# Patient Record
Sex: Male | Born: 1940 | Race: White | Hispanic: No | Smoking: Never smoker
Health system: Southern US, Community
[De-identification: ages and names within clinical notes are randomized; demographics above are authoritative.]

## PROBLEM LIST (undated history)

## (undated) DIAGNOSIS — G629 Polyneuropathy, unspecified: Secondary | ICD-10-CM

## (undated) DIAGNOSIS — F419 Anxiety disorder, unspecified: Secondary | ICD-10-CM

## (undated) DIAGNOSIS — M1990 Unspecified osteoarthritis, unspecified site: Secondary | ICD-10-CM

## (undated) DIAGNOSIS — M869 Osteomyelitis, unspecified: Secondary | ICD-10-CM

## (undated) DIAGNOSIS — I350 Nonrheumatic aortic (valve) stenosis: Secondary | ICD-10-CM

## (undated) DIAGNOSIS — F1011 Alcohol abuse, in remission: Secondary | ICD-10-CM

## (undated) DIAGNOSIS — K2210 Ulcer of esophagus without bleeding: Secondary | ICD-10-CM

## (undated) DIAGNOSIS — F329 Major depressive disorder, single episode, unspecified: Secondary | ICD-10-CM

## (undated) DIAGNOSIS — E785 Hyperlipidemia, unspecified: Secondary | ICD-10-CM

## (undated) DIAGNOSIS — D649 Anemia, unspecified: Secondary | ICD-10-CM

## (undated) DIAGNOSIS — Z9289 Personal history of other medical treatment: Secondary | ICD-10-CM

## (undated) DIAGNOSIS — I739 Peripheral vascular disease, unspecified: Secondary | ICD-10-CM

## (undated) DIAGNOSIS — I10 Essential (primary) hypertension: Secondary | ICD-10-CM

## (undated) DIAGNOSIS — M4302 Spondylolysis, cervical region: Secondary | ICD-10-CM

## (undated) DIAGNOSIS — R011 Cardiac murmur, unspecified: Secondary | ICD-10-CM

## (undated) DIAGNOSIS — G5620 Lesion of ulnar nerve, unspecified upper limb: Secondary | ICD-10-CM

## (undated) DIAGNOSIS — Z8489 Family history of other specified conditions: Secondary | ICD-10-CM

## (undated) DIAGNOSIS — F32A Depression, unspecified: Secondary | ICD-10-CM

## (undated) DIAGNOSIS — L97529 Non-pressure chronic ulcer of other part of left foot with unspecified severity: Secondary | ICD-10-CM

## (undated) DIAGNOSIS — E11621 Type 2 diabetes mellitus with foot ulcer: Secondary | ICD-10-CM

## (undated) HISTORY — DX: Alcohol abuse, in remission: F10.11

## (undated) HISTORY — DX: Essential (primary) hypertension: I10

## (undated) HISTORY — DX: Lesion of ulnar nerve, unspecified upper limb: G56.20

## (undated) HISTORY — DX: Peripheral vascular disease, unspecified: I73.9

## (undated) HISTORY — DX: Osteomyelitis, unspecified: M86.9

## (undated) HISTORY — PX: LAPAROSCOPIC CHOLECYSTECTOMY: SUR755

## (undated) HISTORY — DX: Polyneuropathy, unspecified: G62.9

## (undated) HISTORY — DX: Hyperlipidemia, unspecified: E78.5

## (undated) HISTORY — DX: Spondylolysis, cervical region: M43.02

## (undated) HISTORY — PX: TONSILLECTOMY: SUR1361

---

## 1999-04-16 ENCOUNTER — Ambulatory Visit (HOSPITAL_COMMUNITY): Admission: RE | Admit: 1999-04-16 | Discharge: 1999-04-16 | Payer: Self-pay | Admitting: Gastroenterology

## 1999-07-21 ENCOUNTER — Encounter: Admission: RE | Admit: 1999-07-21 | Discharge: 1999-07-21 | Payer: Self-pay | Admitting: Otolaryngology

## 1999-07-21 ENCOUNTER — Encounter: Payer: Self-pay | Admitting: Otolaryngology

## 2001-06-20 ENCOUNTER — Encounter: Payer: Self-pay | Admitting: Emergency Medicine

## 2001-06-20 ENCOUNTER — Encounter (INDEPENDENT_AMBULATORY_CARE_PROVIDER_SITE_OTHER): Payer: Self-pay | Admitting: Specialist

## 2001-06-20 ENCOUNTER — Inpatient Hospital Stay (HOSPITAL_COMMUNITY): Admission: EM | Admit: 2001-06-20 | Discharge: 2001-06-22 | Payer: Self-pay | Admitting: Emergency Medicine

## 2004-02-08 ENCOUNTER — Encounter (INDEPENDENT_AMBULATORY_CARE_PROVIDER_SITE_OTHER): Payer: Self-pay | Admitting: *Deleted

## 2004-02-08 ENCOUNTER — Ambulatory Visit (HOSPITAL_COMMUNITY): Admission: RE | Admit: 2004-02-08 | Discharge: 2004-02-08 | Payer: Self-pay | Admitting: Gastroenterology

## 2005-07-30 ENCOUNTER — Encounter: Payer: Self-pay | Admitting: Vascular Surgery

## 2005-07-30 ENCOUNTER — Ambulatory Visit: Admission: RE | Admit: 2005-07-30 | Discharge: 2005-07-30 | Payer: Self-pay | Admitting: Family Medicine

## 2005-09-18 ENCOUNTER — Ambulatory Visit: Payer: Self-pay | Admitting: Family Medicine

## 2005-12-04 ENCOUNTER — Encounter: Admission: RE | Admit: 2005-12-04 | Discharge: 2005-12-04 | Payer: Self-pay | Admitting: Family Medicine

## 2006-01-07 ENCOUNTER — Ambulatory Visit: Payer: Self-pay | Admitting: Family Medicine

## 2006-01-07 LAB — CONVERTED CEMR LAB
ALT: 40 units/L (ref 0–40)
AST: 31 units/L (ref 0–37)
BUN: 14 mg/dL (ref 6–23)
CO2: 25 meq/L (ref 19–32)
Calcium: 9.8 mg/dL (ref 8.4–10.5)
Chloride: 106 meq/L (ref 96–112)
Chol/HDL Ratio, serum: 4.9
Cholesterol: 192 mg/dL (ref 0–200)
Creatinine, Ser: 1.1 mg/dL (ref 0.4–1.5)
GFR calc non Af Amer: 72 mL/min
Glomerular Filtration Rate, Af Am: 87 mL/min/{1.73_m2}
Glucose, Bld: 87 mg/dL (ref 70–99)
HDL: 39.4 mg/dL (ref >39.0)
LDL DIRECT: 103.8 mg/dL
Potassium: 4.3 meq/L (ref 3.5–5.1)
Sodium: 142 meq/L (ref 135–145)
Triglyceride fasting, serum: 202 mg/dL (ref 0–149)
VLDL: 40 mg/dL (ref 0–40)

## 2006-04-22 ENCOUNTER — Ambulatory Visit: Payer: Self-pay | Admitting: Family Medicine

## 2006-04-23 ENCOUNTER — Encounter: Admission: RE | Admit: 2006-04-23 | Discharge: 2006-04-23 | Payer: Self-pay | Admitting: Family Medicine

## 2006-06-11 DIAGNOSIS — F419 Anxiety disorder, unspecified: Secondary | ICD-10-CM

## 2006-06-11 DIAGNOSIS — F329 Major depressive disorder, single episode, unspecified: Secondary | ICD-10-CM

## 2006-06-11 DIAGNOSIS — I10 Essential (primary) hypertension: Secondary | ICD-10-CM

## 2006-09-08 ENCOUNTER — Ambulatory Visit: Payer: Self-pay | Admitting: Family Medicine

## 2006-10-01 ENCOUNTER — Ambulatory Visit: Payer: Self-pay | Admitting: Family Medicine

## 2006-10-04 ENCOUNTER — Telehealth (INDEPENDENT_AMBULATORY_CARE_PROVIDER_SITE_OTHER): Payer: Self-pay | Admitting: *Deleted

## 2006-10-05 ENCOUNTER — Encounter (INDEPENDENT_AMBULATORY_CARE_PROVIDER_SITE_OTHER): Payer: Self-pay | Admitting: *Deleted

## 2007-01-14 ENCOUNTER — Ambulatory Visit: Payer: Self-pay | Admitting: Family Medicine

## 2007-04-29 ENCOUNTER — Ambulatory Visit: Payer: Self-pay | Admitting: Family Medicine

## 2007-05-12 ENCOUNTER — Telehealth (INDEPENDENT_AMBULATORY_CARE_PROVIDER_SITE_OTHER): Payer: Self-pay | Admitting: Family Medicine

## 2007-05-24 ENCOUNTER — Encounter (INDEPENDENT_AMBULATORY_CARE_PROVIDER_SITE_OTHER): Payer: Self-pay | Admitting: *Deleted

## 2007-07-28 ENCOUNTER — Telehealth (INDEPENDENT_AMBULATORY_CARE_PROVIDER_SITE_OTHER): Payer: Self-pay | Admitting: *Deleted

## 2007-07-29 ENCOUNTER — Ambulatory Visit: Payer: Self-pay | Admitting: Internal Medicine

## 2008-01-26 ENCOUNTER — Ambulatory Visit: Payer: Self-pay | Admitting: Internal Medicine

## 2008-03-05 ENCOUNTER — Telehealth (INDEPENDENT_AMBULATORY_CARE_PROVIDER_SITE_OTHER): Payer: Self-pay | Admitting: *Deleted

## 2008-03-07 ENCOUNTER — Ambulatory Visit: Payer: Self-pay | Admitting: Internal Medicine

## 2008-03-12 ENCOUNTER — Telehealth (INDEPENDENT_AMBULATORY_CARE_PROVIDER_SITE_OTHER): Payer: Self-pay | Admitting: *Deleted

## 2008-03-12 LAB — CONVERTED CEMR LAB
BUN: 16 mg/dL (ref 6–23)
Basophils Relative: 0.2 % (ref 0.0–3.0)
CO2: 28 meq/L (ref 19–32)
Calcium: 9.4 mg/dL (ref 8.4–10.5)
Cholesterol: 187 mg/dL (ref 0–200)
Creatinine, Ser: 1 mg/dL (ref 0.4–1.5)
GFR calc Af Amer: 96 mL/min
Glucose, Bld: 100 mg/dL — ABNORMAL HIGH (ref 70–99)
HCT: 41 % (ref 39.0–52.0)
Hemoglobin: 14.5 g/dL (ref 13.0–17.0)
Lymphocytes Relative: 36.8 % (ref 12.0–46.0)
Monocytes Absolute: 0.7 10*3/uL (ref 0.1–1.0)
Monocytes Relative: 13.4 % — ABNORMAL HIGH (ref 3.0–12.0)
Neutro Abs: 2.5 10*3/uL (ref 1.4–7.7)
RBC: 4.24 M/uL (ref 4.22–5.81)
RDW: 11.9 % (ref 11.5–14.6)
Total CHOL/HDL Ratio: 3.7
VLDL: 52 mg/dL — ABNORMAL HIGH (ref 0–40)

## 2008-05-04 ENCOUNTER — Telehealth (INDEPENDENT_AMBULATORY_CARE_PROVIDER_SITE_OTHER): Payer: Self-pay | Admitting: *Deleted

## 2008-06-01 ENCOUNTER — Ambulatory Visit: Payer: Self-pay | Admitting: Internal Medicine

## 2008-06-13 ENCOUNTER — Encounter: Payer: Self-pay | Admitting: Internal Medicine

## 2008-09-04 ENCOUNTER — Telehealth: Payer: Self-pay | Admitting: Internal Medicine

## 2008-09-14 ENCOUNTER — Ambulatory Visit: Payer: Self-pay | Admitting: Internal Medicine

## 2008-09-14 ENCOUNTER — Ambulatory Visit: Payer: Self-pay | Admitting: Family Medicine

## 2008-09-19 ENCOUNTER — Ambulatory Visit: Payer: Self-pay | Admitting: Internal Medicine

## 2008-09-19 LAB — CONVERTED CEMR LAB
AST: 24 units/L (ref 0–37)
BUN: 17 mg/dL (ref 6–23)
Basophils Absolute: 0 10*3/uL (ref 0.0–0.1)
CO2: 28 meq/L (ref 19–32)
Chloride: 103 meq/L (ref 96–112)
Cholesterol: 158 mg/dL (ref 0–200)
Creatinine, Ser: 0.9 mg/dL (ref 0.4–1.5)
Direct LDL: 68.1 mg/dL
Eosinophils Relative: 1.7 % (ref 0.0–5.0)
Glucose, Bld: 97 mg/dL (ref 70–99)
Hemoglobin: 14.6 g/dL (ref 13.0–17.0)
Lymphocytes Relative: 35.6 % (ref 12.0–46.0)
Monocytes Relative: 10.5 % (ref 3.0–12.0)
Neutro Abs: 2.4 10*3/uL (ref 1.4–7.7)
Potassium: 3.4 meq/L — ABNORMAL LOW (ref 3.5–5.1)
RBC: 4.45 M/uL (ref 4.22–5.81)
RDW: 11.6 % (ref 11.5–14.6)
TSH: 1.52 microintl units/mL (ref 0.35–5.50)
Total CHOL/HDL Ratio: 4
VLDL: 59 mg/dL — ABNORMAL HIGH (ref 0.0–40.0)
WBC: 4.7 10*3/uL (ref 4.5–10.5)

## 2008-12-07 ENCOUNTER — Encounter: Payer: Self-pay | Admitting: Internal Medicine

## 2009-02-26 ENCOUNTER — Encounter (INDEPENDENT_AMBULATORY_CARE_PROVIDER_SITE_OTHER): Payer: Self-pay | Admitting: *Deleted

## 2009-03-21 ENCOUNTER — Encounter: Payer: Self-pay | Admitting: Internal Medicine

## 2009-03-27 ENCOUNTER — Ambulatory Visit: Payer: Self-pay | Admitting: Internal Medicine

## 2009-03-27 DIAGNOSIS — M25519 Pain in unspecified shoulder: Secondary | ICD-10-CM

## 2009-06-27 ENCOUNTER — Ambulatory Visit: Payer: Self-pay | Admitting: Internal Medicine

## 2009-07-04 LAB — CONVERTED CEMR LAB
ALT: 40 units/L (ref 0–53)
Alkaline Phosphatase: 39 units/L (ref 39–117)
Bilirubin, Direct: 0 mg/dL (ref 0.0–0.3)
Cholesterol: 158 mg/dL (ref 0–200)
HDL: 47.9 mg/dL (ref >39.00)
Total Protein: 6.9 g/dL (ref 6.0–8.3)

## 2009-08-06 ENCOUNTER — Ambulatory Visit: Payer: Self-pay | Admitting: Internal Medicine

## 2009-08-09 ENCOUNTER — Emergency Department (HOSPITAL_BASED_OUTPATIENT_CLINIC_OR_DEPARTMENT_OTHER)
Admission: EM | Admit: 2009-08-09 | Discharge: 2009-08-10 | Payer: Self-pay | Source: Home / Self Care | Admitting: Emergency Medicine

## 2009-08-12 ENCOUNTER — Telehealth (INDEPENDENT_AMBULATORY_CARE_PROVIDER_SITE_OTHER): Payer: Self-pay | Admitting: *Deleted

## 2009-08-12 LAB — CONVERTED CEMR LAB
BUN: 18 mg/dL (ref 6–23)
Calcium: 9.6 mg/dL (ref 8.4–10.5)
GFR calc non Af Amer: 86.98 mL/min (ref >60)
Glucose, Bld: 97 mg/dL (ref 70–99)

## 2009-08-19 ENCOUNTER — Ambulatory Visit: Payer: Self-pay | Admitting: Internal Medicine

## 2009-09-25 ENCOUNTER — Telehealth: Payer: Self-pay | Admitting: Internal Medicine

## 2010-02-04 ENCOUNTER — Ambulatory Visit: Payer: Self-pay | Admitting: Internal Medicine

## 2010-04-06 LAB — CONVERTED CEMR LAB
ALT: 22 units/L (ref 0–53)
AST: 19 units/L (ref 0–37)
BUN: 14 mg/dL (ref 6–23)
CO2: 29 meq/L (ref 19–32)
Calcium: 9.6 mg/dL (ref 8.4–10.5)
Chloride: 104 meq/L (ref 96–112)
Cholesterol: 160 mg/dL (ref 0–200)
Creatinine, Ser: 1 mg/dL (ref 0.4–1.5)
Direct LDL: 81.5 mg/dL
GFR calc Af Amer: 96 mL/min
GFR calc non Af Amer: 80 mL/min
Glucose, Bld: 97 mg/dL (ref 70–99)
HDL: 32.8 mg/dL — ABNORMAL LOW (ref >39.0)
PSA: 1.13 ng/mL (ref 0.10–4.00)
Potassium: 3.7 meq/L (ref 3.5–5.1)
Sodium: 140 meq/L (ref 135–145)
Total CHOL/HDL Ratio: 4.9
Triglycerides: 229 mg/dL (ref 0–149)
VLDL: 46 mg/dL — ABNORMAL HIGH (ref 0–40)

## 2010-04-08 NOTE — Procedures (Signed)
Summary: Colonoscopy/[facility]  Colonoscopy/[facility]   Imported By: Lanelle Bal 04/05/2009 12:54:17  _____________________________________________________________________  External Attachment:    Type:   Image     Comment:   External Document

## 2010-04-08 NOTE — Assessment & Plan Note (Signed)
Summary: refill med/flu shot/kn   Vital Signs:  Patient profile:   70 year old male Weight:      182.13 pounds Pulse rate:   108 / minute Pulse rhythm:   regular BP sitting:   132 / 86  (left arm) Cuff size:   large  Vitals Entered By: Army Fossa CMA (February 04, 2010 11:39 AM) CC: med refill- not fasting  Comments would like to go back to 40 mg fluoxetine [facility] [address]    History of Present Illness: ROV Since the last office visit, he decided not to decrease the dose of fluoxetine because he was doing well. He had an argument with his son last night, his son unfortunately is having problems. At this point however  he feels that does not need change fluoxetine dose and he likes to remain on 40 mg daily.  Review of systems Other than above he feels well, he needs  all his medicines refilled. He had hip pain, went to [facility] orthopedic, was diagnosed with bursitis and had local injections. Felt better. Still drinking 4 or 5 beers the days  he does not work. No suicidal ideas has noted some weight loss, his lifestyle has not changed  Current Medications (verified): 1)  Fluoxetine Hcl 20 Mg Caps (Fluoxetine Hcl) .Marland Kitchen.. 1 By Mouth Once Daily 2)  Lipitor 20 Mg Tabs (Atorvastatin Calcium) .Marland Kitchen.. 1 By Mouth At Bedtime 3)  Capozide 25-15 Mg  Tabs (Captopril-Hydrochlorothiazide) .... Take One Capsule Daily. 4)  Trazodone Hcl 100 Mg  Tabs (Trazodone Hcl) .... Take One Tablet Daily. 5)  Alprazolam 0.5 Mg  Tabs (Alprazolam) .... Take One Tablet Three Times Daily. -- 6)  Centrum Silver  Tabs (Multiple Vitamins-Minerals) 7)  Cialis 20 Mg Tabs (Tadalafil) .Marland Kitchen.. 1 Tab By Mouth No More Frecuent Than   Every Other Day As Needed 8)  Osteo Bi Flex  Allergies (verified): No Known Drug Allergies  Past History:  Past Medical History: Reviewed history from 08/06/2009 and no changes required. Anxiety, Depression, symptoms started aprox 1995, after a divorce  Hyperlipidemia, high  TG Hypertension FH colon polyps: Cscope 2001 Dr Ewing Schlein (hemorrhoids only), Cscope 02-08-2004 hemorhoids, polyps Bx, last Cscope 1-11  Next about 5 years   Past Surgical History: Reviewed history from 06/11/2006 and no changes required. Cholecystectomy  Social History: Reviewed history from 08/06/2009 and no changes required. 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   Lungs:  normal respiratory effort, no intercostal retractions, no accessory muscle use, and normal breath sounds.    Heart:  normal rate, regular rhythm, no murmur, and no gallop.    Extremities:  no pretibial edema bilaterally  Psych:  Oriented X3, memory intact for recent and remote, normally interactive, good eye contact, not anxious appearing, and not depressed appearing.    Impression & Recommendations:  Problem # 1:  HYPERTENSION (ICD-401.9) at goal  His updated medication list for this problem includes:    Capozide 25-15 Mg Tabs (Captopril-hydrochlorothiazide) .Marland Kitchen... Take one capsule daily.  BP today: 132/86 Prior BP: 130/90 (08/19/2009)  Labs Reviewed: K+: 3.7 (08/06/2009) Creat: : 0.9 (08/06/2009)   Chol: 158 (06/27/2009)   HDL: 47.90 (06/27/2009)   LDL: DEL (03/07/2008)   TG: 247.0 (  06/27/2009)  Problem # 2:  HYPERLIPIDEMIA (ICD-272.4) no change His updated medication list for this problem includes:    Lipitor 20 Mg Tabs (Atorvastatin calcium) .Marland Kitchen... 1 by mouth at bedtime  Labs Reviewed: SGOT: 34 (06/27/2009)   SGPT: 40 (06/27/2009)   HDL:47.90 (06/27/2009), 40.30 (09/14/2008)  LDL:DEL (03/07/2008), DEL (10/01/2006)  Chol:158 (06/27/2009), 158 (09/14/2008)  Trig:247.0 (06/27/2009), 295.0 (09/14/2008)  Problem # 3:  DEPRESSION (ICD-311) see history of  present illness, will stay on 40 mg of fluoxetine he reports that doesn't feel that he needs to change or increase his dose at this point His updated medication list for this problem includes:    Fluoxetine Hcl 40 Mg Caps (Fluoxetine hcl) ..... One by mouth daily    Trazodone Hcl 100 Mg Tabs (Trazodone hcl) .Marland Kitchen... Take one tablet daily.    Alprazolam 0.5 Mg Tabs (Alprazolam) .Marland Kitchen... Take one tablet three times daily. --  Complete Medication List: 1)  Fluoxetine Hcl 40 Mg Caps (Fluoxetine hcl) .... One by mouth daily 2)  Lipitor 20 Mg Tabs (Atorvastatin calcium) .Marland Kitchen.. 1 by mouth at bedtime 3)  Capozide 25-15 Mg Tabs (Captopril-hydrochlorothiazide) .... Take one capsule daily. 4)  Trazodone Hcl 100 Mg Tabs (Trazodone hcl) .... Take one tablet daily. 5)  Alprazolam 0.5 Mg Tabs (Alprazolam) .... Take one tablet three times daily. -- 6)  Centrum Silver Tabs (Multiple vitamins-minerals) 7)  Cialis 20 Mg Tabs (Tadalafil) .Marland Kitchen.. 1 tab by mouth no more frecuent than   every other day as needed 8)  Osteo Bi Flex   Other Orders: Flu Vaccine 69yrs + MEDICARE PATIENTS (U0454) Administration Flu vaccine - MCR (U9811)  Patient Instructions: 1)  Please schedule a follow-up appointment in  6 months, fasting, physical exam Prescriptions: ALPRAZOLAM 0.5 MG  TABS (ALPRAZOLAM) Take one tablet three times daily. --  #270 x 0   Entered and Authorized by:   Nolon Rod. Paz MD   Signed by:   Nolon Rod. Paz MD on 02/04/2010   Method used:   Print then Give to Patient   RxID:   [id number] CAPOZIDE 25-15 MG  TABS (CAPTOPRIL-HYDROCHLOROTHIAZIDE) TAKE ONE CAPSULE DAILY.  #90 x 2   Entered and Authorized by:   Nolon Rod. Paz MD   Signed by:   Nolon Rod. Paz MD on 02/04/2010   Method used:   Print then Give to Patient   RxID:   [id number] LIPITOR 20 MG TABS (ATORVASTATIN CALCIUM) 1 by mouth at bedtime  #90 x 2   Entered and Authorized by:   Nolon Rod. Paz MD   Signed by:   Nolon Rod. Paz MD on 02/04/2010   Method used:    Print then Give to Patient   RxID:   [id number] FLUOXETINE HCL 40 MG CAPS (FLUOXETINE HCL) one by mouth daily  #90 x 2   Entered and Authorized by:   Nolon Rod. Paz MD   Signed by:   Nolon Rod. Paz MD on 02/04/2010   Method used:   Print then Give to Patient   RxID:   [id number]    Orders Added: 1)  Flu Vaccine 80yrs + MEDICARE PATIENTS [Q2039] 2)  Administration Flu vaccine - MCR [G0008] 3)  Est. Patient Level III [95188] Flu Vaccine Consent Questions     Do you have a history of severe allergic reactions to this vaccine? no    Any prior history of allergic reactions to egg and/or gelatin? no    Do you have a sensitivity to the preservative  Thimersol? no    Do you have a past history of Guillan-Barre Syndrome? no    Do you currently have an acute febrile illness? no    Have you ever had a severe reaction to latex? no    Vaccine information given and explained to patient? yes    Are you currently pregnant? no    Lot Number:[id number]   Exp Date:09/06/2010   Site Given  rightDeltoid IM: 1)  Flu Vaccine 13yrs + MEDICARE PATIENTS [Q2039] 2)  Administration Flu vaccine - MCR [G0008]   .[email or web address]

## 2010-04-08 NOTE — Assessment & Plan Note (Signed)
Summary: yearly //lch   Vital Signs:  Patient profile:   70 year old male Height:      69 inches Weight:      190.2 pounds BMI:     28.19 Pulse rate:   86 / minute BP sitting:   124 / 80  Vitals Entered By: Shary Decamp (Aug 06, 2009 3:47 PM) CC: cpx   History of Present Illness: yearly, doing well, chart reviewed   Anxiety, Depression-- wonders about need to continue meds   Hyperlipidemia-- good medication compliance   Hypertension-- no ambulatory BPs , good medication compliance    0 Current Medications (verified): 1)  Fluoxetine Hcl 40 Mg  Caps (Fluoxetine Hcl) .... Take One Capsule Daily. 2)  Lipitor 20 Mg Tabs (Atorvastatin Calcium) .Marland Kitchen.. 1 By Mouth At Bedtime 3)  Capozide 25-15 Mg  Tabs (Captopril-Hydrochlorothiazide) .... Take One Capsule Daily. 4)  Trazodone Hcl 100 Mg  Tabs (Trazodone Hcl) .... Take One Tablet Daily. 5)  Alprazolam 0.5 Mg  Tabs (Alprazolam) .... Take One Tablet Three Times Daily. -- 6)  Centrum Silver  Tabs (Multiple Vitamins-Minerals) 7)  Cialis 20 Mg Tabs (Tadalafil) .Marland Kitchen.. 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 Resp:  Denies cough and wheezing. GI:  Denies nausea and vomiting; diarrhea today, not chronic symptoms . GU:  Denies dysuria, hematuria, urinary frequency, and urinary hesitancy.  Physical Exam  General:  alert and well-developed.  alert and well-developed.   Lungs:  normal respiratory effort, no intercostal retractions, no accessory muscle use, and normal breath sounds.    Heart:  normal rate, regular rhythm, no murmur, and no gallop.    Abdomen:  soft, non-tender, no distention, and no masses.    Rectal:  No external abnormalities noted. Normal sphincter tone. No rectal masses or tenderness. Prostate:  Prostate gland firm and smooth, no enlargement, nodularity, tenderness, mass, asymmetry or induration. Extremities:  no pretibial edema bilaterally    Impression & Recommendations:  Problem # 1:  HYPERTENSION (ICD-401.9) at goal  His updated medication list for this problem includes:    Capozide 25-15 Mg Tabs (Captopril-hydrochlorothiazide) .Marland Kitchen... Take one capsule daily.  BP today: 124/80 Prior BP: 130/80 (03/27/2009)  Labs Reviewed: K+: 3.4 (09/14/2008) Creat: : 0.9 (09/14/2008)   Chol: 158 (06/27/2009)   HDL: 47.90 (06/27/2009)   LDL: DEL (03/07/2008)   TG: 247.0 (06/27/2009)  Orders: Venipuncture  (95284) TLB-BMP (Basic Metabolic Panel-BMET) (80048-METABOL)    Problem # 2:  WELL ADULT EXAM (ICD-V70.0) Td 08 pneumonia shot today several Cscopes, see PMH, next 2016 check a PSA diet and exercise discussed alcohol, gets "4-5 beers a day" when not working. Recommend to moderate to 2 or less beers a day.  Problem # 3:  HYPERLIPIDEMIA (ICD-272.4) labs reviewed, all numbers good except the triglycerides which are elevated. Related to drinking alcohol? LFTs normal No change His updated medication list for this problem includes:    Lipitor 20 Mg Tabs (Atorvastatin calcium) .Marland Kitchen... 1 by mouth at bedtime  Labs Reviewed: SGOT: 34 (06/27/2009)   SGPT: 40 (06/27/2009)   HDL:47.90 (06/27/2009), 40.30 (09/14/2008)  LDL:DEL (03/07/2008), DEL (10/01/2006)  Chol:158 (06/27/2009), 158 (09/14/2008)  Trig:247.0 (06/27/2009), 295.0 (09/14/2008)  His updated medication list for this problem includes:    Lipitor 20 Mg Tabs (Atorvastatin calcium) .Marland Kitchen... 1 by mouth at bedtime  Problem # 4:  DEPRESSION (ICD-311) patient started to take medications approximately in 1994 when his ex-wife divorced him. He feels well now and wonders if he needs to keep taking it. See previous entries. At this point I think is okay to start by decreasing fluoxetine to 20 mg daily and reassess in 3 to 4 months. His updated medication list for this problem includes:    Fluoxetine Hcl 20 Mg Caps (Fluoxetine hcl) .Marland Kitchen... 1 by mouth once daily    Trazodone Hcl 100 Mg Tabs (Trazodone hcl) .Marland Kitchen... Take one tablet daily.    Alprazolam 0.5 Mg Tabs (Alprazolam) .Marland Kitchen... Take one tablet three times daily. --  His updated medication list for this problem includes:    Fluoxetine Hcl 40 Mg Caps (Fluoxetine hcl) .Marland Kitchen... Take one capsule daily.    Trazodone Hcl 100 Mg Tabs (Trazodone hcl) .Marland Kitchen... Take one tablet daily.    Alprazolam 0.5 Mg Tabs (Alprazolam) .Marland Kitchen... Take one tablet three times daily. --  Problem # 5:  ANXIETY (ICD-300.00) see # 4 His  updated medication list for this problem includes:    Fluoxetine Hcl 20 Mg Caps (Fluoxetine hcl) .Marland Kitchen... 1 by mouth once daily    Trazodone Hcl 100 Mg Tabs (Trazodone hcl) .Marland Kitchen... Take one tablet daily.    Alprazolam 0.5 Mg Tabs (Alprazolam) .Marland Kitchen... Take one tablet three times daily. --  His updated medication list for this problem includes:    Fluoxetine Hcl 40 Mg Caps (Fluoxetine hcl) .Marland Kitchen... Take one capsule daily.    Trazodone Hcl 100 Mg Tabs (Trazodone hcl) .Marland Kitchen... Take one tablet daily.    Alprazolam 0.5 Mg Tabs (Alprazolam) .Marland Kitchen... Take one tablet three times daily. --  Problem # 6:  total time spent today with the patient more than 25 minutes due to reviewing with the patient his previous labs, ordering  labs and  counseling  Complete Medication List: 1)  Fluoxetine Hcl 20 Mg Caps (Fluoxetine hcl) .Marland Kitchen.. 1 by mouth once daily 2)  Lipitor 20 Mg Tabs (Atorvastatin calcium) .Marland Kitchen.. 1 by mouth at bedtime 3)  Capozide 25-15 Mg Tabs (Captopril-hydrochlorothiazide) .... Take one capsule daily. 4)  Trazodone Hcl 100 Mg Tabs (  Trazodone hcl) .... Take one tablet daily. 5)  Alprazolam 0.5 Mg Tabs (Alprazolam) .... Take one tablet three times daily. -- 6)  Centrum Silver Tabs (Multiple vitamins-minerals) 7)  Cialis 20 Mg Tabs (Tadalafil) .Marland Kitchen.. 1 tab by mouth no more frecuent than   every other day as needed 8)  Osteo Bi Flex   Other Orders: TLB-PSA (Prostate Specific Antigen) (84153-PSA) Pneumococcal Vaccine (10272) Admin 1st Vaccine (53664) Admin 1st Vaccine [facility]) [phone])  Patient Instructions: 1)  Please schedule a follow-up appointment in 3 to 4 months .  Prescriptions: ALPRAZOLAM 0.5 MG  TABS (ALPRAZOLAM) Take one tablet three times daily. --  #270 x 1   Entered and Authorized by:   Nolon Rod. Jerrion Tabbert MD   Signed by:   Nolon Rod. Alfio Loescher MD on 08/06/2009   Method used:   Print then Give to Patient   RxID:   [id number] TRAZODONE HCL 100 MG  TABS (TRAZODONE HCL) TAKE ONE TABLET DAILY.  #90 x 1   Entered  and Authorized by:   Nolon Rod. Cailyn Houdek MD   Signed by:   Nolon Rod. Keyontae Huckeby MD on 08/06/2009   Method used:   Print then Give to Patient   RxID:   [id number] CAPOZIDE 25-15 MG  TABS (CAPTOPRIL-HYDROCHLOROTHIAZIDE) TAKE ONE CAPSULE DAILY.  #90 x 2   Entered and Authorized by:   Nolon Rod. Raiza Kiesel MD   Signed by:   Nolon Rod. Aislin Onofre MD on 08/06/2009   Method used:   Print then Give to Patient   RxID:   [id number] LIPITOR 20 MG TABS (ATORVASTATIN CALCIUM) 1 by mouth at bedtime  #90 x 2   Entered and Authorized by:   Nolon Rod. Percy Comp MD   Signed by:   Nolon Rod. Jlee Harkless MD on 08/06/2009   Method used:   Print then Give to Patient   RxID:   [id number] FLUOXETINE HCL 20 MG CAPS (FLUOXETINE HCL) 1 by mouth once daily  #90 x 1   Entered and Authorized by:   Elita Quick E. Chanese Hartsough MD   Signed by:   Nolon Rod. Haruo Stepanek MD on 08/06/2009   Method used:   Print then Give to Patient   RxID:   [id number]    Preventive Care Screening  Colonoscopy:    Date:  04/08/2009    Next Due:  04/2014    Results:  internal hemorrhoids, diverticulosis  Prior Values:    PSA:  1.13 (10/01/2006)    Colonoscopy:  Hyperplastic Polyp (02/08/2004)    Last Tetanus Booster:  Tdap (10/01/2006)    Last Flu Shot:  Fluvax 3+ (03/27/2009)    Pneumovax Vaccine    Vaccine Type: Pneumovax (Medicare)    Site: left deltoid    Dose: 0.5 ml    Route: IM    Given by: Shary Decamp    Exp. Date: 01/01/2011    Lot #: [id number]

## 2010-04-08 NOTE — Assessment & Plan Note (Signed)
Summary: RMOVE STITCHES--PH   Vital Signs:  Patient profile:   70 year old male Height:      69 inches Weight:      191 pounds Temp:     98.2 degrees F oral Pulse rate:   88 / minute BP sitting:   130 / 90  (left arm)  Vitals Entered By: Jeremy Johann CMA (August 19, 2009 4:15 PM) CC: remove staples Comments REVIEWED MED LIST, PATIENT AGREED DOSE AND INSTRUCTION CORRECT    History of Present Illness: have an accident at home about 10 days ago, went to the ER, staples were placed. Here for staple removal  Allergies (verified): No Known Drug Allergies  Past History:  Past Medical History: Reviewed history from 08/06/2009 and no changes required. Anxiety, Depression, symptoms started aprox 1995, after a divorce  Hyperlipidemia, high TG Hypertension FH colon polyps: Cscope 2001 Dr Ewing Schlein (hemorrhoids only), Cscope 02-08-2004 hemorhoids, polyps Bx, last Cscope 1-11  Next about 5 years   Past Surgical History: Reviewed history from 06/11/2006 and no changes required. Cholecystectomy  Review of Systems       denies any discharge or fever No pain  Physical Exam  General:  alert and well-developed.   Skin:  at the posterior left scalp he has a 1.8 inch laceration with several staples, no discharge   Impression & Recommendations:  Problem # 1:  LACERATION, SCALP (ICD-873.0)  Staples removed carefully, area cleaned with H2O2 No discharge noted The wound is not completely healed and there is a lot of dry blood clots. Plan: Antibiotic ointment Call me if there is any problem with bleeding or discharge  Orders: Suture Removal by Non-Operative MD (Z6109)  Complete Medication List: 1)  Fluoxetine Hcl 20 Mg Caps (Fluoxetine hcl) .Marland Kitchen.. 1 by mouth once daily 2)  Lipitor 20 Mg Tabs (Atorvastatin calcium) .Marland Kitchen.. 1 by mouth at bedtime 3)  Capozide 25-15 Mg Tabs (Captopril-hydrochlorothiazide) .... Take one capsule daily. 4)  Trazodone Hcl 100 Mg Tabs (Trazodone hcl) .... Take  one tablet daily. 5)  Alprazolam 0.5 Mg Tabs (Alprazolam) .... Take one tablet three times daily. -- 6)  Centrum Silver Tabs (Multiple vitamins-minerals) 7)  Cialis 20 Mg Tabs (Tadalafil) .Marland Kitchen.. 1 tab by mouth no more frecuent than   every other day as needed 8)  Osteo Bi Flex

## 2010-04-08 NOTE — Progress Notes (Signed)
Summary: LAB RESULT  Phone Note Outgoing Call   Call placed by: Jeremy Johann CMA,  August 12, 2009 11:03 AM Details for Reason: advise patient: Good results  Summary of Call: left message to call office............Marland KitchenFelecia Deloach CMA  August 12, 2009 11:03 AM  pt return call left message to call office...............Marland KitchenFelecia Deloach CMA  August 13, 2009 8:31 AM   PT WALKED IN, DISCUSS WITH PATIENT, copy of labs given to pt ...................Marland KitchenFelecia Deloach CMA  August 13, 2009 3:29 PM

## 2010-04-08 NOTE — Progress Notes (Signed)
Summary: REFILL REQUEST  Phone Note Refill Request Call back at [phone] Message from:  Pharmacy on September 25, 2009 12:47 PM  Refills Requested: Medication #1:  TRAZODONE HCL 100 MG  TABS TAKE ONE TABLET DAILY.   Dosage confirmed as above?Dosage Confirmed   Supply Requested: 3 months   Last Refilled: 06/26/[facility] [address]  Next Appointment Scheduled: NONE Initial call taken by: Lavell Islam,  September 25, 2009 12:48 PM  Follow-up for Phone Call        we refilled 6 months supply in May 2011.  advise patient to use his refills Follow-up by: Maicie Vanderloop E. Jaquane Boughner MD,  September 25, 2009 4:46 PM  Additional Follow-up for Phone Call Additional follow up Details #1::        Spoke with pharmacy she states that the last refill they have was from may. Pt states the rx he received was not signed. Okay to fill? Army Fossa CMA  September 25, 2009 4:50 PM   ok G. L. Garci­a E. Secily Walthour MD  September 26, 2009 8:16 AM     Prescriptions: TRAZODONE HCL 100 MG  TABS (TRAZODONE HCL) TAKE ONE TABLET DAILY.  #90 x 1   Entered by:   Army Fossa CMA   Authorized by:   Nolon Rod. Ladonne Sharples MD   Signed by:   Army Fossa CMA on 09/26/2009   Method used:   Electronically to        [facility] [phone]* (retail)       [address]       [place], [place]  [address]       Ph: [phone]       Fax: [phone]   RxID:   [id number]

## 2010-04-08 NOTE — Assessment & Plan Note (Signed)
Summary: FOR MED REFILL//PH  Flu Vaccine Consent Questions     Do you have a history of severe allergic reactions to this vaccine? no    Any prior history of allergic reactions to egg and/or gelatin? no    Do you have a sensitivity to the preservative Thimersol? no    Do you have a past history of Guillan-Barre Syndrome? no    Do you currently have an acute febrile illness? no    Have you ever had a severe reaction to latex? no    Vaccine information given and explained to patient? yes    Are you currently pregnant? no    Lot Number:[id number]   Exp Date:09/05/2009   Site Given  rightLeft Deltoid IM Shary Decamp  March 27, 2009 4:17 PM  Vital Signs:  Patient profile:   70 year old male Height:      69 inches Weight:      193 pounds BMI:     28.60 Temp:     98.2 degrees F Pulse rate:   70 / minute BP sitting:   130 / 80  Vitals Entered By: Shary Decamp (March 27, 2009 3:48 PM) CC: rf meds, c/o of cold sxs x 3 weeks   History of Present Illness: depression-- better , son situation improved c/o R shoulder pain since August 2010 after heavy lifting , needs a  referral cold x 3 weeks, see ROS  Current Medications (verified): 1)  Fluoxetine Hcl 40 Mg  Caps (Fluoxetine Hcl) .... Take One Capsule Daily. 2)  Lipitor 20 Mg Tabs (Atorvastatin Calcium) .Marland Kitchen.. 1 By Mouth At Bedtime 3)  Capozide 25-15 Mg  Tabs (Captopril-Hydrochlorothiazide) .... Take One Capsule Daily. 4)  Trazodone Hcl 100 Mg  Tabs (Trazodone Hcl) .... Take One Tablet Daily. 5)  Alprazolam 0.5 Mg  Tabs (Alprazolam) .... Take One Tablet Three Times Daily. -- 6)  Centrum Silver  Tabs (Multiple Vitamins-Minerals) 7)  Cialis 20 Mg Tabs (Tadalafil) .Marland Kitchen.. 1 Tab By Mouth No More Frecuent Than   Every Other Day As Needed  Allergies (verified): No Known Drug Allergies  Past History:  Past Medical History: Reviewed history from 09/19/2008 and no changes required. Anxiety Depression Hyperlipidemia, high  TG Hypertension FH colon polyps: Cscope 2001 Dr Ewing Schlein (hemorrhoids only), Cscope 02-08-2004 hemorhoids, polyps Bx ----> hyperplastic. Next about 5 years (unable to scan reports d/t poor quality)  Past Surgical History: Reviewed history from 06/11/2006 and no changes required. Cholecystectomy  Social History: Reviewed history from 01/26/2008 and no changes required. 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  Physical Exam  General:  alert and well-developed.   Head:  face symmetric, nontender to palpation at  the sinus area Ears:  R ear normal and L ear normal.   Mouth:  slightly red, no discharge Lungs:  normal respiratory effort, no intercostal retractions, no accessory muscle use, and normal breath sounds.   Heart:  normal rate, regular rhythm, no murmur, and no gallop.     Impression & Recommendations:  Problem # 1:  HYPERTENSION (ICD-401.9) no change  His updated medication list for this problem includes:    Capozide 25-15 Mg Tabs (Captopril-hydrochlorothiazide) .Marland Kitchen... Take one capsule daily.  BP today: 130/80 Prior BP: 110/80 (09/19/2008)  Labs Reviewed: K+: 3.4 (09/14/2008) Creat: : 0.9 (09/14/2008)   Chol: 158 (09/14/2008)   HDL: 40.30 (09/14/2008)   LDL: DEL (03/07/2008)   TG: 295.0 (09/14/2008)  Problem # 2:  WELL ADULT EXAM (ICD-V70.0) last physical 7-08-- rec to schedule one  Td 08 flu shot , got one today had a 3th Cscope last week, report pending   Problem # 3:  HYPERLIPIDEMIA (ICD-272.4)  recheck labs on return to the office His updated medication list for this problem includes:    Lipitor 20 Mg Tabs (Atorvastatin calcium) .Marland Kitchen... 1 by mouth at bedtime  Labs Reviewed: SGOT: 24 (09/14/2008)    SGPT: 23 (09/14/2008)   HDL:40.30 (09/14/2008), 50.9 (03/07/2008)  LDL:DEL (03/07/2008), DEL (10/01/2006)  Chol:158 (09/14/2008), 187 (03/07/2008)  Trig:295.0 (09/14/2008), 261 (03/07/2008)  Problem # 4:  DEPRESSION (ICD-311) well-controlled at present, see HPI RFs    His updated medication list for this problem includes:    Fluoxetine Hcl 40 Mg Caps (Fluoxetine hcl) .Marland Kitchen... Take one capsule daily.    Trazodone Hcl 100 Mg Tabs (Trazodone hcl) .Marland Kitchen... Take one tablet daily.    Alprazolam 0.5 Mg Tabs (Alprazolam) .Marland Kitchen... Take one tablet three times daily. --  Problem # 5:  URI (ICD-465.9) see instructions   Problem # 6:  SHOULDER PAIN, RIGHT (ICD-719.41)  R shoulder refer to ortho  Orders: Orthopedic Referral (Ortho)  Complete Medication List: 1)  Fluoxetine Hcl 40 Mg Caps (Fluoxetine hcl) .... Take one capsule daily. 2)  Lipitor 20 Mg Tabs (Atorvastatin calcium) .Marland Kitchen.. 1 by mouth at bedtime 3)  Capozide 25-15 Mg Tabs (Captopril-hydrochlorothiazide) .... Take one capsule daily. 4)  Trazodone Hcl 100 Mg Tabs (Trazodone hcl) .... Take one tablet daily. 5)  Alprazolam 0.5 Mg Tabs (Alprazolam) .... Take one tablet three times daily. -- 6)  Centrum Silver Tabs (Multiple vitamins-minerals) 7)  Cialis 20 Mg Tabs (Tadalafil) .Marland Kitchen.. 1 tab by mouth no more frecuent than   every other day as needed 8)  Amoxicillin 500 Mg Caps (Amoxicillin) .... Two tablets by mouth twice a day  Other Orders: Flu Vaccine 34yrs + (04540) Administration Flu vaccine - MCR (J8119)  Patient Instructions: 1)  rest, fluids, Tylenol 2)  Robitussin-DM as needed for cough 3)  nasonex : two sprays on each side of the nose until sample gone, call if you like a prescription  4)  amoxicillin ( antibiotic) 5)   call if not better in a few days 6)  Please schedule a follow-up appointment in 3 to 4  months (fasting, physical exam) Prescriptions: AMOXICILLIN 500 MG CAPS (AMOXICILLIN) two tablets by mouth twice a day  #28 x 0    Entered and Authorized by:   Nolon Rod. Blaine Hari MD   Signed by:   Nolon Rod. Devonte Migues MD on 03/27/2009   Method used:   Electronically to        [facility] [phone]* (retail)       [address]       [place], [place]  [address]       Ph: [phone]       Fax: [phone]   RxID:   [id number] ALPRAZOLAM 0.5 MG  TABS (ALPRAZOLAM) Take one tablet three times daily. --  #270 x 1   Entered by:   Shary Decamp   Authorized by:   Nolon Rod. Jadan Rouillard MD   Signed by:   Shary Decamp on 03/27/2009   Method used:   Print  then Give to Patient   RxID:   [id number] TRAZODONE HCL 100 MG  TABS (TRAZODONE HCL) TAKE ONE TABLET DAILY.  #90 x 1   Entered by:   Shary Decamp   Authorized by:   Nolon Rod. Shanicqua Coldren MD   Signed by:   Shary Decamp on 03/27/2009   Method used:   Electronically to        [facility] [phone]* (retail)       [address]       [place], [place]  [address]       Ph: [phone]       Fax: [phone]   RxID:   [id number] CAPOZIDE 25-15 MG  TABS (CAPTOPRIL-HYDROCHLOROTHIAZIDE) TAKE ONE CAPSULE DAILY.  #90 x 2   Entered by:   Shary Decamp   Authorized by:   Nolon Rod. Ryelynn Guedea MD   Signed by:   Shary Decamp on 03/27/2009   Method used:   Electronically to        [facility] [phone]* (retail)       [address]       [place], [place]  [address]       Ph: [phone]       Fax: [phone]   RxID:   [id number] LIPITOR 20 MG TABS (ATORVASTATIN CALCIUM) 1 by mouth at bedtime  #90 x 2   Entered by:   Shary Decamp   Authorized by:   Nolon Rod. Cornelious Bartolucci MD   Signed by:   Shary Decamp on 03/27/2009   Method used:   Electronically to        [facility] [phone]* (retail)       [address]       [place], [place]  [address]       Ph: [phone]       Fax: [phone]   RxID:   [id number] FLUOXETINE HCL 40 MG  CAPS (FLUOXETINE HCL) TAKE ONE CAPSULE DAILY.  #90 x 2   Entered by:   Shary Decamp   Authorized by:   Nolon Rod. Kristiann Noyce MD   Signed by:   Shary Decamp on 03/27/2009   Method used:    Electronically to        [facility] [phone]* (retail)       [address]       [place], [place]  [address]       Ph: [phone]       Fax: [phone]   RxID:   [id number]

## 2010-06-25 ENCOUNTER — Encounter: Payer: Self-pay | Admitting: Internal Medicine

## 2010-06-25 ENCOUNTER — Ambulatory Visit (INDEPENDENT_AMBULATORY_CARE_PROVIDER_SITE_OTHER): Payer: 59 | Admitting: Internal Medicine

## 2010-06-25 DIAGNOSIS — F411 Generalized anxiety disorder: Secondary | ICD-10-CM

## 2010-06-25 MED ORDER — CAPTOPRIL-HYDROCHLOROTHIAZIDE 25-15 MG PO TABS: 1.0000 | ORAL_TABLET | Freq: Every day | ORAL | Status: DC

## 2010-06-25 MED ORDER — ALPRAZOLAM 0.5 MG PO TABS: 0.5000 mg | ORAL_TABLET | Freq: Three times a day (TID) | ORAL | Status: DC | PRN

## 2010-06-25 MED ORDER — FLUOXETINE HCL 20 MG PO CAPS: ORAL_CAPSULE | ORAL | Status: DC

## 2010-06-25 MED ORDER — TRAZODONE HCL 100 MG PO TABS: 100.0000 mg | ORAL_TABLET | Freq: Every day | ORAL | Status: DC

## 2010-06-25 MED ORDER — ATORVASTATIN CALCIUM 20 MG PO TABS: 20.0000 mg | ORAL_TABLET | Freq: Every day | ORAL | Status: DC
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
## 2010-06-25 NOTE — Progress Notes (Signed)
  Subjective:    Patient ID: Gene [name], male    DOB: 09/01/1940, 70 y.o.   MRN: [id number]  HPI  Here to see if we need to adjust his Prozac dose. His son is having a lot of issues with , he is a Office manager, He Was Fired. He Also worked @ [employer] and was also fired. He Is Now Jobless and Having a Hard Time Finding a Job after He Was Committed to [facility]. Review of Systems He feels both depressed and anxious No suicidal thoughts Has some hard time with sleeping, he wakes up often  Past Medical History  Diagnosis Date  . Anxiety and depression 1995    symptoms started aprox 1995, after a divorce  . Hyperlipidemia     high TG  . Hypertension          Objective:   Physical Exam Alert oriented x3, obviously worried about his son's situation.       Assessment & Plan:  Today , I spent more than 15  min with the patient, >50% of the time counseling

## 2010-07-25 NOTE — Assessment & Plan Note (Signed)
[facility]                        [place] OFFICE NOTE   NAME:Gene [name], MAJOR                       MRN:          [id number]  DATE:04/22/2006                            DOB:          07-Feb-1941    REASON FOR VISIT:  Right thumb pain.  Mr. Speir is a 70 year old male  who reports that 1 month ago he injured his right thumb while placing a  piece of wood in his wood stove.  He states that he accidentally injured  his thumb while pushing the wood into the stove, and hitting the DIP  joint on the lip of the stove.  He reports that he took a pretty large  chunk of skin off with the injury.  Since then he noticed discomfort in  the lateral aspect of the thumb, specifically with externally rotating  the wrist and attempting to grab objects on the floor.  He reports that  the pain is excruciating.  He describes it as a burning, tingling  sensation.  Otherwise, he is able to flex and extend his thumb without  problems.  He is able to lift heavy objects.   MEDICATIONS:  1. Fluoxetine 40 mg.  2. Lipitor 10 mg.  3. Captopril hydrochlorothiazide 25/50.  4. Trazodone 100 mg.  5. Alprazolam 0.5 mg.   ALLERGIES:  No known drug allergies.   OBJECTIVE:  Weight 186.2, pulse 76, blood pressure 120/70.  Examination of the right hand is significant for no obvious deformity.  He does have a slight area of healed abrasion on the dorsum of the right  thumb at the DIP joint.  He does have good flexion and extension at the  PIP and MIP joints.  Resisted flexion and extension causes no  discomfort.  Simulation of the maneuver he described in the subjective  portion of the exam causes pain on the lateral aspect of the thumb.   IMPRESSION:  A 70 year old male with right thumb pain.  Most likely, the  patient caused superficial nerve injury.   PLAN:  1. We will obtain an x-ray to rule out any obvious pathology.  2. We will refer the patient to hand specialist  after review of the x-      ray.  The patient expressed understanding.  3. After review of the above, we will refer the patient to hand      specialist to assess for any other etiology of his pain including      tendons.     Leanne Chang, M.D.  Electronically Signed    LA/MedQ  DD: 04/22/2006  DT: 04/22/2006  Job #: [id number]

## 2010-07-25 NOTE — Op Note (Signed)
NAME:  Gene [name], AMER NO.:  [id number]   MEDICAL RECORD NO.:  [id number]          PATIENT TYPE:  AMB   LOCATION:  ENDO                         FACILITY[facility]  [facility]   PHYSICIAN:  Petra Kuba, M.D.    DATE OF BIRTH:  03-19-1940   DATE OF PROCEDURE:  02/08/2004  DATE OF DISCHARGE:                                 OPERATIVE REPORT   PROCEDURE:  Colonoscopy with biopsy.   INDICATIONS FOR PROCEDURE:  Family history of colon polyps due for repeat  screening.   Consent was signed after risks, benefits, methods, and options were  thoroughly discussed in the past.   MEDICINES USED:  Demerol 120, Versed 12.   DESCRIPTION OF PROCEDURE:  Rectal inspection was pertinent for small  external hemorrhoids. Digital exam was negative. The video colonoscope was  inserted, easily advanced around the colon to the cecum. This did require  some abdominal pressure but no position changes. No obvious abnormality was  seen on insertion.  The cecum was identified by the appendiceal orifice and  the ileocecal valve. In fact, the scope was inserted a short ways into the  terminal ileum which was normal. Photo documentation was obtained. The scope  was slowly withdrawn. The prep was adequate. There was some liquid stool  that required washing and suctioning. On slow withdrawal through the colon,  the cecum, ascending, transverse and descending were all normal.  As the  scope was withdrawn around the distal sigmoid, a tiny questionable  hyperplastic appearing polyp was seen and cold biopsied. Two other tiny ones  in the rectum were seen and cold biopsied as well, all were put in the same  container. Anal rectal pullthrough and retroflexion confirmed some small  hemorrhoids.  The scope was straightened and readvanced a short ways up the  left side of the colon, air was suctioned, scope removed. The patient  tolerated the procedure well. There was no obvious or immediate  complications.   ENDOSCOPIC DIAGNOSIS:  1.  Internal and external hemorrhoids with some small tears.  2.  Three tiny doubt significant polyps in the rectum and distal sigmoid      cold biopsied.  3.  Otherwise within normal limits to the terminal ileum.   PLAN:  Await pathology.  Would probably recheck colon screening in five  years.  Happy to see back p.r.n. otherwise return care to Leanne Chang,  M.D. for the customary health care maintenance to include yearly rectals and  guaiacs.      MEM/MEDQ  D:  02/08/2004  T:  02/09/2004  Job:  [id number]   cc:   Leanne Chang, M.D.  [address]  [place]  [place] [address]  Fax: [phone]

## 2010-07-25 NOTE — Op Note (Signed)
[facility]  Patient:    Gene [name], Gene [name] Visit Number: [id number] MRN: [id number]          Service Type: MED Location: 1W 0155 01 Attending Physician:  Shelly Rubenstein Dictated by:   Abigail Miyamoto, M.D. Proc. Date: 06/21/01 Admit Date:  06/20/2001                             Operative Report  PREOPERATIVE DIAGNOSIS:  Acute cholecystitis.  POSTOPERATIVE DIAGNOSIS:  Acute cholecystitis.  PROCEDURE:  Laparoscopic cholecystectomy.  SURGEON:  Douglas A. Magnus Ivan, M.D.  ASSISTANT:  Gita Kudo, M.D.  ANESTHESIA:  General endotracheal.  ESTIMATED BLOOD LOSS:  Minimal.  DESCRIPTION OF PROCEDURE:  The patient was brought to the operating room and identified as Gene [name].  He was placed supine on operating room table, and general anesthesia was induced.  His abdomen was then prepped and draped in the usual sterile fashion.  Using a #15 blade, a small transverse incision was made below the umbilicus.  Incision was carried down to the fascia which was then opened with a scalpel.  A hemostat was used to pass through the peritoneal cavity.  A 0 Vicryl pursestring suture was then placed around the fascia opening.  The Hasson port was placed through the opening, and insufflation of the abdomen was begun.  An 11 mm port was placed in the patients epigastrium, and two 5 mm ports were placed in the patients right flank under direct vision.  The gallbladder was found to be acutely inflamed and distended.  Therefore, needle aspiration was used to suction the bile out of the gallbladder to facilitate grasping of the gallbladder.  The gallbladder was then grasped and retracted above the liver bed.  Dissection was then carried out at the base.  The cystic duct was then dissected out, clipped three times proximally and once distally and transected with the scissors. The cystic artery was the identified and clipped several times proximally and once  distally and transected as well.  The gallbladder was then slowly dissected free from the liver bed with the electrocautery.  Again, it was found to be acutely inflamed and obstructed.  Once the gallbladder was completely free from the liver bed, it was placed in an Endosac and removed through the umbilicus.  Hemostasis was then achieved in the liver bed with the electrocautery.  The abdomen was then copiously irrigated with several liters of normal saline.  Again, hemostasis appeared to be achieved.  All ports were removed under direct vision, and the abdomen was deflated.  All incisions were then anesthetized with 0.25% Marcaine and closed with 4-0 Monocryl subcuticular sutures.  Steri-Strips, gauze, and Tegaderm were then applied. The patient tolerated the procedure well.  All sponge, needle, and instrument counts were correct at the end of the procedure.  The patient was then extubated in the operating room and taken in stable condition to the recovery room. Dictated by:   Abigail Miyamoto, M.D. Attending Physician:  Shelly Rubenstein DD:  06/22/98 TD:  06/21/01 Job: [id number]

## 2010-07-29 ENCOUNTER — Ambulatory Visit (INDEPENDENT_AMBULATORY_CARE_PROVIDER_SITE_OTHER): Payer: 59 | Admitting: Internal Medicine

## 2010-07-29 ENCOUNTER — Encounter: Payer: Self-pay | Admitting: Internal Medicine

## 2010-07-29 DIAGNOSIS — F411 Generalized anxiety disorder: Secondary | ICD-10-CM

## 2010-07-29 MED ORDER — ALPRAZOLAM 0.5 MG PO TABS: 0.5000 mg | ORAL_TABLET | Freq: Three times a day (TID) | ORAL | Status: DC | PRN

## 2010-07-29 MED ORDER — CITALOPRAM HYDROBROMIDE 20 MG PO TABS: 40.0000 mg | ORAL_TABLET | Freq: Every day | ORAL | Status: DC

## 2010-07-29 NOTE — Patient Instructions (Signed)
For 1 week:take prozac 20mg  1 tab and citalopram 20mg  1/2 tablet. The next week, stop prozac , take 1 tablet of citalopram x 7 days Then increase citalpram to 1.5 tabs a day Came back in 4 weeks, physical exam
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
## 2010-08-09 ENCOUNTER — Other Ambulatory Visit: Payer: Self-pay | Admitting: Internal Medicine

## 2010-09-08 ENCOUNTER — Other Ambulatory Visit: Payer: Self-pay | Admitting: Internal Medicine

## 2010-09-26 ENCOUNTER — Ambulatory Visit (INDEPENDENT_AMBULATORY_CARE_PROVIDER_SITE_OTHER): Payer: 59 | Admitting: Internal Medicine

## 2010-09-26 ENCOUNTER — Encounter: Payer: Self-pay | Admitting: Internal Medicine

## 2010-09-26 DIAGNOSIS — Z0000 Encounter for general adult medical examination without abnormal findings: Secondary | ICD-10-CM

## 2010-09-26 DIAGNOSIS — E785 Hyperlipidemia, unspecified: Secondary | ICD-10-CM

## 2010-09-26 DIAGNOSIS — Z125 Encounter for screening for malignant neoplasm of prostate: Secondary | ICD-10-CM

## 2010-09-26 DIAGNOSIS — F411 Generalized anxiety disorder: Secondary | ICD-10-CM

## 2010-09-26 DIAGNOSIS — I10 Essential (primary) hypertension: Secondary | ICD-10-CM

## 2010-09-26 LAB — COMPREHENSIVE METABOLIC PANEL
AST: 49 U/L — ABNORMAL HIGH (ref 0–37)
BUN: 16 mg/dL (ref 6–23)
Calcium: 9.4 mg/dL (ref 8.4–10.5)
Chloride: 102 mEq/L (ref 96–112)
Creatinine, Ser: 0.8 mg/dL (ref 0.4–1.5)
Total Bilirubin: 0.7 mg/dL (ref 0.3–1.2)

## 2010-09-26 LAB — CBC WITH DIFFERENTIAL/PLATELET
Basophils Absolute: 0 10*3/uL (ref 0.0–0.1)
Eosinophils Absolute: 0 10*3/uL (ref 0.0–0.7)
HCT: 43 % (ref 39.0–52.0)
Hemoglobin: 14.7 g/dL (ref 13.0–17.0)
Lymphocytes Relative: 26.2 % (ref 12.0–46.0)
Lymphs Abs: 1.9 10*3/uL (ref 0.7–4.0)
MCHC: 34.1 g/dL (ref 30.0–36.0)
Neutro Abs: 4.5 10*3/uL (ref 1.4–7.7)
Platelets: 202 10*3/uL (ref 150.0–400.0)
RDW: 12.4 % (ref 11.5–14.6)

## 2010-09-26 LAB — PSA: PSA: 1.63 ng/mL (ref 0.10–4.00)

## 2010-09-26 LAB — LIPID PANEL
HDL: 45.8 mg/dL (ref >39.00)
Triglycerides: 203 mg/dL — ABNORMAL HIGH (ref 0.0–149.0)
VLDL: 40.6 mg/dL — ABNORMAL HIGH (ref 0.0–40.0)

## 2010-09-26 MED ORDER — ALPRAZOLAM 0.5 MG PO TABS: 0.5000 mg | ORAL_TABLET | Freq: Three times a day (TID) | ORAL | Status: DC | PRN

## 2010-09-26 MED ORDER — CAPTOPRIL-HYDROCHLOROTHIAZIDE 25-15 MG PO TABS: 1.0000 | ORAL_TABLET | Freq: Every day | ORAL | Status: DC

## 2010-09-26 MED ORDER — ZOSTER VACCINE LIVE 19400 UNT/0.65ML ~~LOC~~ SOLR: 0.6500 mL | Freq: Once | SUBCUTANEOUS | Status: DC

## 2010-09-26 MED ORDER — ATORVASTATIN CALCIUM 20 MG PO TABS: ORAL_TABLET | ORAL | Status: DC

## 2010-09-26 MED ORDER — CITALOPRAM HYDROBROMIDE 20 MG PO TABS: ORAL_TABLET | ORAL | Status: DC

## 2010-09-26 MED ORDER — TRAZODONE HCL 100 MG PO TABS: 100.0000 mg | ORAL_TABLET | Freq: Every day | ORAL | Status: DC

## 2010-09-26 NOTE — Assessment & Plan Note (Signed)
Current meds helping better, RF

## 2010-09-26 NOTE — Assessment & Plan Note (Signed)
Good med compliance , labs  

## 2010-09-26 NOTE — Assessment & Plan Note (Signed)
No change, BP wnl today

## 2010-09-26 NOTE — Assessment & Plan Note (Signed)
Td 08 pneumonia shot 2011 zostavax-- benefits discussed, Rx provided several Cscopes, see PMH, last 1-2011next 2016 check a PSA diet and exercise discussed alcohol, 6 pack day Sat and Sunday, none other days

## 2010-09-26 NOTE — Progress Notes (Signed)
  Subjective:    Patient ID: Gene [name], male    DOB: [birth year], 70 y.o.   MRN: [id number]  HPI Here for Medicare AWV:  1. Risk factors based on Past M, S, F history: reviewed 2. Physical Activities: not  Very active , diet is ok   3. Depression/mood:  Improved w/ recent change of meds  4. Hearing:  No problems noted or reported  5. ADL's:  Independent  6. Fall Risk: no recent problems  7. home Safety: does feelsafe at home  8. Height, weight, &visual acuity: see VS, slt decrease, saw eye doctor 6 months ago, plans to visit yearly 9. Counseling: provided 10. Labs ordered based on risk factors: if needed  11. Referral Coordination: if needed 12.  Care Plan, see assessment and plan  13.   Cognitive Assessment: motor skills and cognition wnl  In addition, today we discussed the following: HTN-- no amb BPs High chol-- good med compliance, also on fish oil Past Medical History  Diagnosis Date  . Anxiety and depression 1995    symptoms started aprox 1995, after a divorce  . Hyperlipidemia     high TG  . Hypertension    Past Surgical History  Procedure Date  . 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 Anxiety and depression 1995    symptoms started aprox 1995, after a divorce  . Hyperlipidemia     high TG  . Hypertension      Review of Systems No chest pain or shortness of breath Note nausea, vomiting, diarrhea. Rarely he sees red blood after a bowel movement, thinks related to hemorrhoids. As stated above,  his anxiety is better, see previous notes----> son is in jail.    Objective:   Physical Exam  Constitutional: He is oriented to person, place, and time. He appears well-developed and well-nourished. No distress.  HENT:  Head: Normocephalic and atraumatic.  Neck: No thyromegaly present.  Cardiovascular: Normal rate, regular rhythm and normal heart sounds.   No murmur heard. Pulmonary/Chest: Effort normal and breath sounds normal. No respiratory distress. He has no wheezes. He has no rales.  Abdominal: Soft. Bowel sounds are normal. He exhibits no distension. There is no tenderness. There is no rebound and no guarding.  Genitourinary: Rectum normal and prostate normal.  Musculoskeletal: He exhibits no edema.  Neurological: He is alert and oriented to person, place, and time.       Seems to be doing much better emotionally  Skin: Skin is warm and dry. He is not diaphoretic.  Psychiatric: He has a normal mood and affect. His behavior is normal. Judgment and thought content normal.          Assessment & Plan:

## 2010-10-01 ENCOUNTER — Telehealth: Payer: Self-pay | Admitting: *Deleted

## 2010-10-01 NOTE — Telephone Encounter (Signed)
Message copied by Leanne Lovely on Wed Oct 01, 2010 10:01 AM ------      Message from: [name] E      Created: Tue Sep 30, 2010  5:50 PM       Advise patient      Cholesterol is well controlled at present, the triglycerides are slightly elevated. Continue with same meds.      LFTs are slightly high, we need to recheck when he comes back. May be related to alcohol intake.      Other labs normal

## 2010-10-01 NOTE — Telephone Encounter (Signed)
Message left for patient to return my call.  

## 2010-10-02 NOTE — Telephone Encounter (Signed)
Pt aware of labs  

## 2010-10-28 ENCOUNTER — Other Ambulatory Visit: Payer: Self-pay | Admitting: Internal Medicine

## 2010-10-28 NOTE — Telephone Encounter (Signed)
Rx Done . 

## 2011-01-22 ENCOUNTER — Ambulatory Visit (INDEPENDENT_AMBULATORY_CARE_PROVIDER_SITE_OTHER): Payer: 59 | Admitting: Internal Medicine

## 2011-01-22 ENCOUNTER — Encounter: Payer: Self-pay | Admitting: Internal Medicine

## 2011-01-22 VITALS — BP 160/90 | HR 78 | Temp 98.3°F | Wt 195.6 lb

## 2011-01-22 DIAGNOSIS — F1010 Alcohol abuse, uncomplicated: Secondary | ICD-10-CM

## 2011-01-22 DIAGNOSIS — I10 Essential (primary) hypertension: Secondary | ICD-10-CM

## 2011-01-22 MED ORDER — ALPRAZOLAM 0.5 MG PO TABS: 0.5000 mg | ORAL_TABLET | Freq: Three times a day (TID) | ORAL | Status: DC | PRN

## 2011-01-22 MED ORDER — CITALOPRAM HYDROBROMIDE 20 MG PO TABS: ORAL_TABLET | ORAL | Status: DC

## 2011-01-22 MED ORDER — ATORVASTATIN CALCIUM 20 MG PO TABS: ORAL_TABLET | ORAL | Status: DC

## 2011-01-22 MED ORDER — LOSARTAN POTASSIUM-HCTZ 100-25 MG PO TABS: 1.0000 | ORAL_TABLET | Freq: Every day | ORAL | Status: DC

## 2011-01-22 MED ORDER — TRAZODONE HCL 100 MG PO TABS: 100.0000 mg | ORAL_TABLET | Freq: Every day | ORAL | Status: DC

## 2011-01-22 NOTE — Patient Instructions (Signed)
Start the new BP medicine, stop captopril In two weeks came back for labs only: CMP---dx HTN Check the  blood pressure 2 or 3 times a week, be sure it is less than 140/85. If it is consistently higher, let me know

## 2011-01-22 NOTE — Progress Notes (Signed)
  Subjective:    Patient ID: Gene [name], male    DOB: 1940/10/20, 70 y.o.   MRN: [id number]  HPI ROV Concerned about increased LFTs Still drinks ETOH, see a/p Needs paper Rx s  Past Medical History  Diagnosis Date  . Anxiety and depression 1995    symptoms started aprox 1995, after a divorce  . Hyperlipidemia     high TG  . Hypertension    Past Surgical History  Procedure Date  . Cholecystectomy      Review of Systems Ambulatory blood pressures in the 160/90 the last few times he has been checked. Patient concerned    Objective:   Physical Exam  Constitutional: He is oriented to person, place, and time. He appears well-developed and well-nourished.  Cardiovascular: Normal rate, regular rhythm and normal heart sounds.   No murmur heard. Pulmonary/Chest: Effort normal and breath sounds normal. No respiratory distress. He has no wheezes. He has no rales.  Neurological: He is alert and oriented to person, place, and time.          Assessment & Plan:  Today , I spent more than 15  min with the patient, >50% of the time counseling, see ETOH

## 2011-01-22 NOTE — Assessment & Plan Note (Addendum)
Today, the patient reports he actually has been drinking heavily for years, most recently is drinking daily, 5-6 beers. Part of the problem is that he is not working, he quit to help taking care of his granddaughter and he has a lot of time in his hands. LFTs were elevated, w/this information at hand, I think the increased LFTs is d/t alcohol. Extensive counseling, reminded the patient that more than 2 drinks a day is deleterous to his health. Recommend moderation, if he is unable to moderate, then he needs help, AA would be a good start

## 2011-01-22 NOTE — Assessment & Plan Note (Signed)
Not well controlled, discontinue Capozide, start Hyzaar

## 2011-02-14 ENCOUNTER — Telehealth: Payer: Self-pay | Admitting: Internal Medicine

## 2011-02-14 DIAGNOSIS — I10 Essential (primary) hypertension: Secondary | ICD-10-CM

## 2011-02-14 NOTE — Telephone Encounter (Signed)
Please call pt, due for labs, arrange for a  CMP--dx HTN [facility]

## 2011-02-16 NOTE — Telephone Encounter (Signed)
Left message to call office

## 2011-02-19 ENCOUNTER — Other Ambulatory Visit (INDEPENDENT_AMBULATORY_CARE_PROVIDER_SITE_OTHER): Payer: 59

## 2011-02-19 DIAGNOSIS — I10 Essential (primary) hypertension: Secondary | ICD-10-CM

## 2011-02-19 LAB — COMPREHENSIVE METABOLIC PANEL
AST: 48 U/L — ABNORMAL HIGH (ref 0–37)
Albumin: 4.2 g/dL (ref 3.5–5.2)
Alkaline Phosphatase: 46 U/L (ref 39–117)
Glucose, Bld: 104 mg/dL — ABNORMAL HIGH (ref 70–99)
Potassium: 3.5 mEq/L (ref 3.5–5.1)
Sodium: 142 mEq/L (ref 135–145)
Total Protein: 7 g/dL (ref 6.0–8.3)

## 2011-02-19 NOTE — Telephone Encounter (Signed)
Patient called back and stated he will come over today to have labs, order placed.

## 2011-02-24 ENCOUNTER — Encounter: Payer: Self-pay | Admitting: *Deleted

## 2011-04-17 ENCOUNTER — Ambulatory Visit (INDEPENDENT_AMBULATORY_CARE_PROVIDER_SITE_OTHER): Payer: 59 | Admitting: Internal Medicine

## 2011-04-17 VITALS — BP 136/84 | HR 81 | Temp 98.9°F | Wt 196.0 lb

## 2011-04-17 DIAGNOSIS — G609 Hereditary and idiopathic neuropathy, unspecified: Secondary | ICD-10-CM

## 2011-04-17 DIAGNOSIS — G629 Polyneuropathy, unspecified: Secondary | ICD-10-CM | POA: Insufficient documentation

## 2011-04-17 LAB — VITAMIN B12: Vitamin B-12: 262 pg/mL (ref 211–911)

## 2011-04-17 LAB — RPR

## 2011-04-17 LAB — FOLATE: Folate: >20 ng/mL

## 2011-04-17 MED ORDER — GABAPENTIN 300 MG PO CAPS: 300.0000 mg | ORAL_CAPSULE | Freq: Three times a day (TID) | ORAL | Status: DC

## 2011-04-17 MED ORDER — ALPRAZOLAM 0.5 MG PO TABS: 0.5000 mg | ORAL_TABLET | Freq: Three times a day (TID) | ORAL | Status: DC | PRN

## 2011-04-17 NOTE — Assessment & Plan Note (Addendum)
Symptoms consistent with peripheral neuropathy. Told pt that most likely etiology is ETOH but other dx need to explored Plan: Nerve conduction study Labs Neurontin. Strongly recommend to discontinue alcohol.

## 2011-04-17 NOTE — Patient Instructions (Signed)
Start Neurontin: 1 tablet twice a day for one week, then one tablet 3 times a day. Watch for somnolence. Come back in one month. Please think about AA

## 2011-04-17 NOTE — Progress Notes (Signed)
  Subjective:    Patient ID: Gene [name], male    DOB: 1940/10/22, 71 y.o.   MRN: [id number]  HPI Acute visit 3 months ago developed numbness symmetrically on his feet, went to see a foot doctor, he referred him for vascular studies that show mild peripheral vascular disease, eventually saw Dr. Allyson Sabal, was prescribed Pletal which caused palpitation. He felt was discontinue and he was recommended observation. The numbness continued unchanged.  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 Denies any bladder or bowel incontinence No lower extremity edema Cont with ETOH "I'm cutting back, 2 times a week and during the weekend"    Objective:   Physical Exam Alert, oriented, in no apparent distress. Lower extremities without edema, pedal pulse decreased. Pinprick examination of the lower extremities show patchy decrease in a sock distribution symmetrically. Motor strength normal, gait normal. Speech and gait normal. DTRs are symmetric.     Assessment & Plan:

## 2011-04-18 LAB — VITAMIN D 25 HYDROXY (VIT D DEFICIENCY, FRACTURES): Vit D, 25-Hydroxy: 32 ng/mL (ref 30–89)

## 2011-04-19 ENCOUNTER — Encounter: Payer: Self-pay | Admitting: Internal Medicine

## 2011-04-19 ENCOUNTER — Telehealth: Payer: Self-pay | Admitting: Internal Medicine

## 2011-04-21 ENCOUNTER — Encounter: Payer: Self-pay | Admitting: *Deleted

## 2011-04-22 ENCOUNTER — Telehealth: Payer: Self-pay | Admitting: Internal Medicine

## 2011-04-22 NOTE — Telephone Encounter (Signed)
LMOVM for pt to return call 

## 2011-04-22 NOTE — Telephone Encounter (Signed)
Pt called back about his lab results,  Call back [phone]  Thanks!

## 2011-05-04 NOTE — Telephone Encounter (Signed)
Error, duplicate

## 2011-05-13 ENCOUNTER — Telehealth: Payer: Self-pay | Admitting: Internal Medicine

## 2011-05-13 NOTE — Telephone Encounter (Signed)
Nerve conduction study from 05/08/2011: Mild to moderate peripheral neuropathy legs and left upper extremity. EMG question of right L5 radiculopathy. Clinical correlation required.  Advise patient, nerve conduction study showed neuropathy, continue Neurontin and followup as planned. We'll discuss this in more detail at the time of the visit.

## 2011-05-14 NOTE — Telephone Encounter (Signed)
Spoke with pt & scheduled an appt for 3.15.13.

## 2011-05-14 NOTE — Telephone Encounter (Signed)
LMOVM for pt to return call 

## 2011-05-14 NOTE — Telephone Encounter (Signed)
Patient returned your call for results.  Please call him at [phone] or [phone]

## 2011-05-25 ENCOUNTER — Ambulatory Visit: Payer: 59 | Admitting: Internal Medicine

## 2011-05-30 ENCOUNTER — Other Ambulatory Visit: Payer: Self-pay | Admitting: Internal Medicine

## 2011-06-03 ENCOUNTER — Other Ambulatory Visit: Payer: Self-pay

## 2011-06-03 NOTE — Telephone Encounter (Signed)
Call from patient and he stated he was going out of town on vacation and wanted to get his Xanax filled by next Monday. He will be out of town 06/09/11 until 06/18/11. Rx was last filled 04/17/11 # 90 with 1 refill. Please advise    KP

## 2011-06-04 MED ORDER — ALPRAZOLAM 0.5 MG PO TABS: 0.5000 mg | ORAL_TABLET | Freq: Three times a day (TID) | ORAL | Status: DC | PRN

## 2011-06-04 NOTE — Telephone Encounter (Signed)
90, no RF 

## 2011-06-19 ENCOUNTER — Ambulatory Visit: Payer: 59 | Admitting: Internal Medicine

## 2011-07-09 ENCOUNTER — Other Ambulatory Visit: Payer: Self-pay | Admitting: Internal Medicine

## 2011-07-10 ENCOUNTER — Ambulatory Visit (INDEPENDENT_AMBULATORY_CARE_PROVIDER_SITE_OTHER): Payer: 59 | Admitting: Internal Medicine

## 2011-07-10 VITALS — BP 142/70 | HR 97 | Temp 98.1°F | Wt 193.0 lb

## 2011-07-10 DIAGNOSIS — G629 Polyneuropathy, unspecified: Secondary | ICD-10-CM

## 2011-07-10 DIAGNOSIS — F1010 Alcohol abuse, uncomplicated: Secondary | ICD-10-CM

## 2011-07-10 DIAGNOSIS — M1990 Unspecified osteoarthritis, unspecified site: Secondary | ICD-10-CM

## 2011-07-10 DIAGNOSIS — G609 Hereditary and idiopathic neuropathy, unspecified: Secondary | ICD-10-CM

## 2011-07-10 DIAGNOSIS — F329 Major depressive disorder, single episode, unspecified: Secondary | ICD-10-CM

## 2011-07-10 DIAGNOSIS — I10 Essential (primary) hypertension: Secondary | ICD-10-CM

## 2011-07-10 MED ORDER — ALPRAZOLAM 0.5 MG PO TABS: 0.5000 mg | ORAL_TABLET | Freq: Three times a day (TID) | ORAL | Status: DC | PRN

## 2011-07-10 MED ORDER — LOSARTAN POTASSIUM-HCTZ 100-25 MG PO TABS: 1.0000 | ORAL_TABLET | Freq: Every day | ORAL | Status: DC

## 2011-07-10 MED ORDER — CITALOPRAM HYDROBROMIDE 20 MG PO TABS: ORAL_TABLET | ORAL | Status: DC

## 2011-07-10 MED ORDER — GABAPENTIN 300 MG PO CAPS: 300.0000 mg | ORAL_CAPSULE | Freq: Three times a day (TID) | ORAL | Status: DC

## 2011-07-10 NOTE — Assessment & Plan Note (Signed)
Recently the patient self referred to Dr. Charlann Boxer, orthopedic surgery--->was diagnosed with spinal stenosis after a MRI. Current plan is  physical therapy and they will consider also a epidural. (MRI scanned)

## 2011-07-10 NOTE — Progress Notes (Signed)
  Subjective:    Patient ID: Gene [name], male    DOB: [birth year], 71 y.o.   MRN: [id number]  HPI Followup from previous visit. He was seen with neuropathy, nerve conduction study was confirmatory, it also show a question of L5 radiculopathy. He started neurontin with great results, "99% better" Blood work was normal except for a A1c of 5.8, slightly elevated. He self referred to a orthopedic doctor,  they diagnosed with spinal stenosis after a MRI, see assessment and plan.  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 Ambulatory BPs normal. He is definitely drinking less alcohol, he is volunteering at his granddaughter's school, in general feels better .    Objective:   Physical Exam  General -- alert, well-developed. No apparent distress.  Lungs -- normal respiratory effort, no intercostal retractions, no accessory muscle use, and normal breath sounds.   Heart-- normal rate, regular rhythm, no murmur, and no gallop.     Extremities-- no pretibial edema bilaterally  Neurologic-- alert & oriented X3 and strength normal in all extremities. Psych-- Cognition and judgment appear intact. Alert and cooperative with normal attention span and concentration.  not anxious appearing and not depressed appearing.      Assessment & Plan:

## 2011-07-10 NOTE — Assessment & Plan Note (Signed)
No change 

## 2011-07-10 NOTE — Assessment & Plan Note (Signed)
Feels better.

## 2011-07-10 NOTE — Assessment & Plan Note (Signed)
Recent nerve conduction study was confirmatory and also showed a question of L5 radiculopathy (later was diagnosed with spinal stenosis). B12 folic acid normal, A1c slightly elevated at 5.8. He is responding well to gabapentin Plan: Continue Neurontin.

## 2011-07-10 NOTE — Telephone Encounter (Signed)
Refill done.  

## 2011-07-10 NOTE — Assessment & Plan Note (Signed)
Today he reports improvement, drinking at most once a week.

## 2011-07-12 ENCOUNTER — Encounter: Payer: Self-pay | Admitting: Internal Medicine

## 2011-08-07 ENCOUNTER — Other Ambulatory Visit: Payer: Self-pay | Admitting: Internal Medicine

## 2011-08-07 NOTE — Telephone Encounter (Signed)
last seen and filled 07/10/11 # 90. Please advise    KP

## 2011-08-20 ENCOUNTER — Other Ambulatory Visit: Payer: Self-pay | Admitting: Dermatology

## 2011-09-03 ENCOUNTER — Other Ambulatory Visit: Payer: Self-pay | Admitting: Internal Medicine

## 2011-09-03 NOTE — Telephone Encounter (Signed)
Denied, too early

## 2011-09-03 NOTE — Telephone Encounter (Signed)
Ok to refill? Future appt 8.2.13.

## 2011-09-05 ENCOUNTER — Other Ambulatory Visit: Payer: Self-pay | Admitting: Internal Medicine

## 2011-09-15 ENCOUNTER — Telehealth: Payer: Self-pay | Admitting: Internal Medicine

## 2011-09-15 MED ORDER — PREGABALIN 75 MG PO CAPS: 75.0000 mg | ORAL_CAPSULE | Freq: Two times a day (BID) | ORAL | Status: DC

## 2011-09-15 NOTE — Telephone Encounter (Signed)
Paz pt please advise

## 2011-09-15 NOTE — Telephone Encounter (Signed)
Caller: Dior/Patient; PCP: [name]; CB#: [phone]; Pt calling today 09/15/11 regarding has been taking Gabapentin 300 mg TID for about a couple of months now.  Pt said one of the side effects is swelling of feet and ankles.  Pt began having swelling of feet and ankles around 09/08/11.  Says difficult to walk and drive a car.  Has appt for August 2, does not want to come in to office earlier b/c having work done at his home.  Would like for Dr. Drue Novel to possibly change his medication to something else or maybe reduce medication so the swelling will go down in his feet.  PLEASE CALL PT BACK AT [phone] TO ADVISE.  PHARMACY IS [facility] [address] [phone].  AGAIN PT DOES NOT WISH TO COME IN FOR APPT.

## 2011-09-15 NOTE — Telephone Encounter (Signed)
Refill done.  

## 2011-09-15 NOTE — Telephone Encounter (Signed)
If swelling  severe or he has chest pain or shortness of breath he needs to be seen.  Otherwise discontinue Neurontin and start Lyrica  75 mg twice a day #60 and 2 refills

## 2011-10-09 ENCOUNTER — Ambulatory Visit (INDEPENDENT_AMBULATORY_CARE_PROVIDER_SITE_OTHER): Payer: 59 | Admitting: Internal Medicine

## 2011-10-09 VITALS — BP 128/82 | HR 59 | Temp 98.0°F | Ht 69.5 in | Wt 203.0 lb

## 2011-10-09 DIAGNOSIS — I10 Essential (primary) hypertension: Secondary | ICD-10-CM

## 2011-10-09 DIAGNOSIS — F411 Generalized anxiety disorder: Secondary | ICD-10-CM

## 2011-10-09 DIAGNOSIS — G609 Hereditary and idiopathic neuropathy, unspecified: Secondary | ICD-10-CM

## 2011-10-09 DIAGNOSIS — K649 Unspecified hemorrhoids: Secondary | ICD-10-CM

## 2011-10-09 DIAGNOSIS — E785 Hyperlipidemia, unspecified: Secondary | ICD-10-CM

## 2011-10-09 DIAGNOSIS — R7309 Other abnormal glucose: Secondary | ICD-10-CM

## 2011-10-09 DIAGNOSIS — R739 Hyperglycemia, unspecified: Secondary | ICD-10-CM

## 2011-10-09 DIAGNOSIS — Z0000 Encounter for general adult medical examination without abnormal findings: Secondary | ICD-10-CM

## 2011-10-09 DIAGNOSIS — R609 Edema, unspecified: Secondary | ICD-10-CM

## 2011-10-09 DIAGNOSIS — G629 Polyneuropathy, unspecified: Secondary | ICD-10-CM

## 2011-10-09 DIAGNOSIS — F1010 Alcohol abuse, uncomplicated: Secondary | ICD-10-CM

## 2011-10-09 LAB — HEMOGLOBIN A1C
Hgb A1c MFr Bld: 5.4 % (ref <5.7)
Mean Plasma Glucose: 108 mg/dL (ref <117)

## 2011-10-09 LAB — CBC WITH DIFFERENTIAL/PLATELET
Basophils Absolute: 0 10*3/uL (ref 0.0–0.1)
Basophils Relative: 0 % (ref 0–1)
Hemoglobin: 12.1 g/dL — ABNORMAL LOW (ref 13.0–17.0)
MCHC: 34.7 g/dL (ref 30.0–36.0)
Neutro Abs: 3.2 10*3/uL (ref 1.7–7.7)
Neutrophils Relative %: 59 % (ref 43–77)
RDW: 12.7 % (ref 11.5–15.5)

## 2011-10-09 MED ORDER — LOSARTAN POTASSIUM 100 MG PO TABS: 100.0000 mg | ORAL_TABLET | Freq: Every day | ORAL | Status: DC

## 2011-10-09 MED ORDER — ALPRAZOLAM 0.5 MG PO TABS: 0.5000 mg | ORAL_TABLET | Freq: Three times a day (TID) | ORAL | Status: DC | PRN

## 2011-10-09 MED ORDER — HYDROCORTISONE ACE-PRAMOXINE 1-1 % RE FOAM: 1.0000 | Freq: Two times a day (BID) | RECTAL | Status: AC

## 2011-10-09 MED ORDER — FUROSEMIDE 40 MG PO TABS: 40.0000 mg | ORAL_TABLET | Freq: Every day | ORAL | Status: DC

## 2011-10-09 NOTE — Assessment & Plan Note (Signed)
On Lyrica which help significantly with neuropathy symptoms, apparently is causing some edema, see above.

## 2011-10-09 NOTE — Patient Instructions (Addendum)
for swelling: Low-salt diet Leg elevation 30 minutes twice a day Change to losartan and Lasix. Stop losartan HCT Come back in 6 weeks ------ For hemorrhoids, use ProctoFoam twice a day for 10 days, call if they bleeding is severe or persistent

## 2011-10-09 NOTE — Assessment & Plan Note (Signed)
Reports good medication compliance without apparent side effects, check FLP, AST, ALT

## 2011-10-09 NOTE — Assessment & Plan Note (Signed)
Internal hemorrhoids causing daily bleeding. Prescribed ProctoFoam, call if not better. He is up-to-date on his colonoscopies.

## 2011-10-09 NOTE — Assessment & Plan Note (Signed)
A1c 5.8 on February 2013. Check A1c

## 2011-10-09 NOTE — Assessment & Plan Note (Addendum)
Well-controlled at the present time with citalopram, alprazolam as needed, trazodone

## 2011-10-09 NOTE — Assessment & Plan Note (Signed)
Reportedly better, drinking only on Sundays

## 2011-10-09 NOTE — Assessment & Plan Note (Signed)
Well-controlled, he does have edema. Plan:  Discontinue Hyzaar 100-25, start losartan 100, Lasix 40. See edema See instructions, office visit in 6 weeks

## 2011-10-09 NOTE — Progress Notes (Signed)
  Subjective:    Patient ID: Gene [name], male    DOB: [birth year], 71 y.o.   MRN: [id number]  HPI Here for Medicare AWV: 1. Risk factors based on Past M, S, F history: reviewed 2. Physical Activities: no physical activity due to pain @ feet, DJD and  neuropathy   3. Depression/mood: doing ok at present w/ current meds  4. 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 ADL's:  Independent   6. Fall Risk: no recent problems, prevention discussed  7. home Safety: does feelsafe at home   8. Height, weight, &visual acuity: see VS, slt decrease, sees Dr Nile Riggs q year 9. Counseling: provided 10. Labs ordered based on risk factors: if needed   11. Referral Coordination: if needed 12.  Care Plan, see assessment and plan   13.   Cognitive Assessment: motor skills and cognition wnl   In addition, we discussed the following High blood pressure, good medication compliance, checks his BP weekly and usually wnl  High cholesterol, taking medications as prescribed, no apparent side effects. Neuropathy-- on lyrica which helps but  c/o causing "swelling in the feet", see assessment and plan Blood per rectum, in the past he has occasional red blood in the toilet paper after bowel movements, and the last month it has been consistently every day.   Past medical history  Hypertension  Dyslipidemia, high triglycerides  Anxiety and depression, symptoms started approximately 1995 after a divorce  Alcohol abuse  PVD, mild saw Dr Allyson Sabal, intolerant to pletal, Rx observation  Neuropathy  Spinal Stenosis ([facility])  Past surgical history  Cholecystectomy   Family History: high cholesterol-- M Lung cancer-- asbestos related , F Father passed away 01-02-07 DM-- no CAD--no colon ca--no colon polyps-- F   prostate ca--no  Social History: Divorced since 1994; lives by himself, 3 sons, 1 GD  (all live close by) Never Smoked Alcohol--used to drink more, now admits to  drink on weekends only   Drug use-no Occupation: retired diet-- needs improvement, has gain wt     Review of Systems No chest pain or shortness of breath. No orthopnea. No dysuria or gross hematuria. No difficulty urinating. No nausea, vomiting, abdominal pain or diarrhea. Other than the red blood per rectum, stools are normal in color.     Objective:   Physical Exam General -- alert, well-developed, overweight-appearing Neck --no thyromegaly , normal carotid pulse, no JVD at 45 Lungs -- normal respiratory effort, no intercostal retractions, no accessory muscle use, and normal breath sounds.   Heart-- no murmur, regular?   Abdomen--soft, non-tender, no distention, no masses, no HSM, no guarding, and no rigidity.   Extremities--  +/+++ pitting symmetric edema from the mid pretibial area down to his toes Rectal-- No external abnormalities noted. Normal sphincter tone. No rectal masses or tenderness. Brown stool  Prostate:  Prostate gland firm and smooth, no enlargement, nodularity, tenderness, mass, asymmetry or induration. Anoscopy-- few small internal hemorrhoids, I saw a scoriation, think from the DRE and anoscopy Neurologic-- alert & oriented X3 and strength normal in all extremities. Psych-- Cognition and judgment appear intact. Alert and cooperative with normal attention span and concentration.  not anxious appearing and not depressed appearing.       Assessment & Plan:

## 2011-10-09 NOTE — Assessment & Plan Note (Addendum)
Td 08 pneumonia shot 2011 zostavax ~2012   several Cscopes, see PMH, last 1-2011next 2016 labs   diet and exercise discussed

## 2011-10-09 NOTE — Assessment & Plan Note (Signed)
Reports lower extremity edema since he started Neurontin, later on he was switch to Lyrica which also cause edema but not as severe. No orthopnea, EKG with no acute changes. Plan: Leg elevation, compression stockings, changed from HCTZ to Lasix. Followup in 6 weeks. Low-salt diet.

## 2011-10-10 LAB — BASIC METABOLIC PANEL
BUN: 19 mg/dL (ref 6–23)
CO2: 24 mEq/L (ref 19–32)
Chloride: 105 mEq/L (ref 96–112)
Potassium: 3.7 mEq/L (ref 3.5–5.3)

## 2011-10-10 LAB — LIPID PANEL
LDL Cholesterol: 86 mg/dL (ref 0–99)
Triglycerides: 282 mg/dL — ABNORMAL HIGH (ref <150)
VLDL: 56 mg/dL — ABNORMAL HIGH (ref 0–40)

## 2011-10-10 LAB — ALT: ALT: 35 U/L (ref 0–53)

## 2011-10-28 ENCOUNTER — Other Ambulatory Visit: Payer: 59

## 2011-10-30 ENCOUNTER — Other Ambulatory Visit: Payer: Self-pay | Admitting: Internal Medicine

## 2011-10-30 NOTE — Telephone Encounter (Signed)
Refill done.  

## 2011-11-01 ENCOUNTER — Telehealth: Payer: Self-pay | Admitting: Internal Medicine

## 2011-11-01 NOTE — Telephone Encounter (Signed)
Advise patient, needs labs: Iron-ferritin-hemoglobin :  DX anemia

## 2011-11-03 NOTE — Telephone Encounter (Signed)
Labs scheduled  

## 2011-11-06 ENCOUNTER — Other Ambulatory Visit (INDEPENDENT_AMBULATORY_CARE_PROVIDER_SITE_OTHER): Payer: 59

## 2011-11-06 DIAGNOSIS — D649 Anemia, unspecified: Secondary | ICD-10-CM

## 2011-11-06 NOTE — Progress Notes (Signed)
Labs only

## 2011-11-10 ENCOUNTER — Encounter: Payer: Self-pay | Admitting: *Deleted

## 2011-12-04 ENCOUNTER — Ambulatory Visit (INDEPENDENT_AMBULATORY_CARE_PROVIDER_SITE_OTHER): Payer: 59 | Admitting: Internal Medicine

## 2011-12-04 VITALS — BP 126/72 | HR 72 | Temp 98.1°F | Wt 200.0 lb

## 2011-12-04 DIAGNOSIS — K649 Unspecified hemorrhoids: Secondary | ICD-10-CM

## 2011-12-04 DIAGNOSIS — G629 Polyneuropathy, unspecified: Secondary | ICD-10-CM

## 2011-12-04 DIAGNOSIS — R609 Edema, unspecified: Secondary | ICD-10-CM

## 2011-12-04 DIAGNOSIS — I10 Essential (primary) hypertension: Secondary | ICD-10-CM

## 2011-12-04 LAB — CBC WITH DIFFERENTIAL/PLATELET
Basophils Absolute: 0 10*3/uL (ref 0.0–0.1)
HCT: 43.1 % (ref 39.0–52.0)
Lymphs Abs: 1.5 10*3/uL (ref 0.7–4.0)
MCV: 98.3 fl (ref 78.0–100.0)
Monocytes Absolute: 0.7 10*3/uL (ref 0.1–1.0)
Neutrophils Relative %: 64.3 % (ref 43.0–77.0)
Platelets: 187 10*3/uL (ref 150.0–400.0)
RDW: 12.8 % (ref 11.5–14.6)

## 2011-12-04 LAB — BASIC METABOLIC PANEL
BUN: 18 mg/dL (ref 6–23)
Chloride: 108 mEq/L (ref 96–112)
Creatinine, Ser: 0.9 mg/dL (ref 0.4–1.5)
GFR: 86.38 mL/min (ref >60.00)
Glucose, Bld: 105 mg/dL — ABNORMAL HIGH (ref 70–99)
Potassium: 3.4 mEq/L — ABNORMAL LOW (ref 3.5–5.1)

## 2011-12-04 MED ORDER — CITALOPRAM HYDROBROMIDE 20 MG PO TABS: ORAL_TABLET | ORAL | Status: DC

## 2011-12-04 MED ORDER — ATORVASTATIN CALCIUM 20 MG PO TABS: 20.0000 mg | ORAL_TABLET | Freq: Every day | ORAL | Status: DC

## 2011-12-04 MED ORDER — LOSARTAN POTASSIUM 100 MG PO TABS: 100.0000 mg | ORAL_TABLET | Freq: Every day | ORAL | Status: DC

## 2011-12-04 MED ORDER — FUROSEMIDE 40 MG PO TABS: 40.0000 mg | ORAL_TABLET | Freq: Every day | ORAL | Status: DC

## 2011-12-04 MED ORDER — PREGABALIN 75 MG PO CAPS: 75.0000 mg | ORAL_CAPSULE | Freq: Two times a day (BID) | ORAL | Status: DC

## 2011-12-04 MED ORDER — TRAZODONE HCL 100 MG PO TABS: 100.0000 mg | ORAL_TABLET | Freq: Every day | ORAL | Status: DC

## 2011-12-04 NOTE — Assessment & Plan Note (Signed)
Improved

## 2011-12-04 NOTE — Assessment & Plan Note (Signed)
Recently changed from Hyzaar to losartan + Lasix d/t edema, doing fairly well, check a BMP

## 2011-12-04 NOTE — Assessment & Plan Note (Signed)
Bleed has decreased significantly, eating more fiber. Was noted to have anemia, will check a hemoglobin today

## 2011-12-04 NOTE — Assessment & Plan Note (Signed)
Currently on lyrica, does help w/ pain to some extent

## 2011-12-04 NOTE — Progress Notes (Signed)
  Subjective:    Patient ID: Gene [name], male    DOB: [birth year], 71 y.o.   MRN: [id number]  HPI Followup visit Hypertension, we changed from Hyzaar to losartan and lasix, tolerates well, good  BPs at home, edema has decreased. Still on Lyrica, does help to some extent with neuropathy pain.  Past medical history   Hypertension   Dyslipidemia, high triglycerides   Anxiety and depression, symptoms started approximately 1995 after a divorce   Alcohol abuse   PVD, mild saw Dr Allyson Sabal, intolerant to pletal, Rx observation   Neuropathy   Spinal Stenosis ([facility])  Past surgical history   Cholecystectomy   Family History: high cholesterol-- M Lung cancer-- asbestos related , F Father passed away December 15, 2006 DM-- no CAD--no colon ca--no colon polyps-- F   prostate ca--no  Social History: Divorced since 1994; lives by himself, 3 sons, 1 GD  (all live close by) Never Smoked Alcohol--used to drink more, now admits to drink on weekends only   Drug use-no Occupation: retired diet-- needs improvement, has gain wt     Review of Systems Denies nausea, vomiting, diarrhea. Was seen before with a hemorrhoidal bleed, is doing much better. No abdominal pain no GERD symptoms. Ongoing back pain problem, to have a local ejection soon.     Objective:   Physical Exam  General -- alert, well-developed, and overweight appearing. No apparent distress.  Lungs -- normal respiratory effort, no intercostal retractions, no accessory muscle use, and normal breath sounds.   Heart-- normal rate, regular rhythm, no murmur, and no gallop.   Extremities-- no pretibial edema bilaterally  Psych-- Cognition and judgment appear intact. Alert and cooperative with normal attention span and concentration.  not anxious appearing and not depressed appearing.       Assessment & Plan:

## 2011-12-07 ENCOUNTER — Encounter: Payer: Self-pay | Admitting: Internal Medicine

## 2011-12-10 ENCOUNTER — Encounter: Payer: Self-pay | Admitting: *Deleted

## 2012-02-10 ENCOUNTER — Other Ambulatory Visit: Payer: Self-pay | Admitting: Internal Medicine

## 2012-02-11 NOTE — Telephone Encounter (Signed)
Ok to refill 

## 2012-02-11 NOTE — Telephone Encounter (Signed)
Ok 90 and 1 RF 

## 2012-02-11 NOTE — Telephone Encounter (Signed)
Refill done.  

## 2012-04-15 ENCOUNTER — Encounter: Payer: Self-pay | Admitting: *Deleted

## 2012-04-15 ENCOUNTER — Other Ambulatory Visit: Payer: Self-pay | Admitting: Internal Medicine

## 2012-04-15 NOTE — Telephone Encounter (Signed)
Pt made aware rx & controlled substance contract is ready at front desk.

## 2012-04-15 NOTE — Telephone Encounter (Signed)
Rx printed but needs a controlled substance agreement and a urine test

## 2012-04-15 NOTE — Telephone Encounter (Signed)
Ok to refill? Last OV 9.27.13 Last filled 12.4.13

## 2012-04-23 ENCOUNTER — Other Ambulatory Visit: Payer: Self-pay

## 2012-05-24 ENCOUNTER — Encounter: Payer: Self-pay | Admitting: Internal Medicine

## 2012-06-02 ENCOUNTER — Telehealth: Payer: Self-pay | Admitting: Internal Medicine

## 2012-06-02 NOTE — Telephone Encounter (Signed)
Ok to refill? Last OV 9.27.13 Last filled 9.27.13

## 2012-06-02 NOTE — Telephone Encounter (Signed)
done

## 2012-06-15 ENCOUNTER — Telehealth: Payer: Self-pay | Admitting: Internal Medicine

## 2012-06-15 NOTE — Telephone Encounter (Signed)
Ok to refill? Last OV 9.27.13 Last filled 2.7.14

## 2012-06-16 NOTE — Telephone Encounter (Signed)
Left msg on vmail making pt aware he is due for an OV.

## 2012-06-16 NOTE — Telephone Encounter (Signed)
Done, 90, no Rf. Advise pt he is due for OV, please schedule

## 2012-07-08 ENCOUNTER — Ambulatory Visit (INDEPENDENT_AMBULATORY_CARE_PROVIDER_SITE_OTHER): Payer: 59 | Admitting: Internal Medicine

## 2012-07-08 VITALS — BP 124/78 | HR 75 | Temp 98.3°F | Wt 198.0 lb

## 2012-07-08 DIAGNOSIS — Z9189 Other specified personal risk factors, not elsewhere classified: Secondary | ICD-10-CM

## 2012-07-08 DIAGNOSIS — E785 Hyperlipidemia, unspecified: Secondary | ICD-10-CM

## 2012-07-08 DIAGNOSIS — G629 Polyneuropathy, unspecified: Secondary | ICD-10-CM

## 2012-07-08 DIAGNOSIS — K649 Unspecified hemorrhoids: Secondary | ICD-10-CM

## 2012-07-08 DIAGNOSIS — F419 Anxiety disorder, unspecified: Secondary | ICD-10-CM

## 2012-07-08 DIAGNOSIS — F341 Dysthymic disorder: Secondary | ICD-10-CM

## 2012-07-08 DIAGNOSIS — I10 Essential (primary) hypertension: Secondary | ICD-10-CM

## 2012-07-08 DIAGNOSIS — G609 Hereditary and idiopathic neuropathy, unspecified: Secondary | ICD-10-CM

## 2012-07-08 LAB — BASIC METABOLIC PANEL
Glucose, Bld: 92 mg/dL (ref 70–99)
Potassium: 3.8 mEq/L (ref 3.5–5.3)
Sodium: 141 mEq/L (ref 135–145)

## 2012-07-08 LAB — ALT: ALT: 17 U/L (ref 0–53)

## 2012-07-08 MED ORDER — TRAZODONE 25 MG HALF TABLET: 25.0000 mg | ORAL_TABLET | Freq: Every day | ORAL | Status: DC

## 2012-07-08 MED ORDER — HYDROCORTISONE 2.5 % RE CREA: TOPICAL_CREAM | Freq: Two times a day (BID) | RECTAL | Status: DC | PRN

## 2012-07-08 MED ORDER — ALPRAZOLAM 0.5 MG PO TABS: 0.5000 mg | ORAL_TABLET | Freq: Three times a day (TID) | ORAL | Status: DC | PRN

## 2012-07-08 NOTE — Assessment & Plan Note (Signed)
Check LFTs 

## 2012-07-08 NOTE — Assessment & Plan Note (Signed)
Symptoms subjectively better with B12 supplementations, he is concerned about b12 overdose, recommend not to take more than 2,000 mcg a day

## 2012-07-08 NOTE — Patient Instructions (Addendum)
Hemorrhoids: Metamucil 2 capsules every day with breakfast Use anusol HC cream as needed. Call if symptoms increase --- Don't take more than 2000 mcg of vitamin B12 daily -- Decrease trazodone to 50 mg tablets, 1/2  tablet at bedtime for a month, then as needed. --- Next visit In 4 months for a complete physical, fasting. Please arrange

## 2012-07-08 NOTE — Assessment & Plan Note (Signed)
See comments under anxiety and depression 

## 2012-07-08 NOTE — Assessment & Plan Note (Addendum)
Colonoscopy was 2011, had internal and external hemorrhoids. Have symptoms consistent with hemorrhoids, occasional stool between BMs Plan: Treat hemorrhoids, see instructions

## 2012-07-08 NOTE — Assessment & Plan Note (Signed)
Well-controlled,  check a BMP 

## 2012-07-08 NOTE — Progress Notes (Signed)
  Subjective:    Patient ID: Gene [name], male    DOB: 1940-12-02, 72 y.o.   MRN: [id number]  HPI Routine office visit, has several concerns. History of neuropathy, taking a number of supplements including B12 and lipoic acid and seems to feel better. Has some weakness on the fingers --> plans to see Dr. Anne Hahn, neurology for that. Toenails are thick hard to cut, podiatrist?. Hemorrhoids, occasionally see red blood drops when he wipes, sometimes he passes a small amount of the stools between bowel movements. H/o anxiety and depression, symptoms well-controlled, has been taking trazodone for a while, does not know if that is doing anything for him.    Past medical history   Hypertension   Dyslipidemia, high triglycerides   Anxiety and depression, symptoms started approximately 1995 after a divorce   Alcohol abuse   PVD, mild saw Dr Allyson Sabal, intolerant to pletal, Rx observation   Neuropathy   Spinal Stenosis ([facility])  Past surgical history   Cholecystectomy   Family History: high cholesterol-- M Lung cancer-- asbestos related , F Father passed away Nov 30, 2006 DM-- no CAD--no colon ca--no colon polyps-- F   prostate ca--no  Social History: Divorced since 1994; lives by himself, 3 sons, 1 GD  (all live close by) Never Smoked Alcohol--used to drink more, now admits to drink on weekends only   Drug use-no Occupation: retired diet-- needs improvement, has gain wt     Review of Systems No chest pain or shortness or breath No nausea, vomiting, diarrhea. Denies anorectal pain but does have some itching.    Objective:   Physical Exam BP 124/78  Pulse 75  Temp(Src) 98.3 F (36.8 C) (Oral)  Wt 198 lb (89.812 kg)  BMI 28.83 kg/m2  SpO2 96%  General -- alert, well-developed, No apparent distress  Lungs -- normal respiratory effort, no intercostal retractions, no accessory muscle use, and normal breath sounds.   Heart-- normal rate, regular rhythm, II/VI Systolic murmur  at the right parasternal area, and no gallop.   Abdomen--soft, non-tender, no distention, no masses, no HSM, no guarding, and no rigidity.   Extremities-- , Holes are slightly dystrophic, not really thick. Rectal-- No external abnormalities noted except for a single skin tag. Normal sphincter tone. No rectal masses or tenderness. Brown stool  Prostate:  Prostate gland firm and smooth, no enlargement, nodularity, tenderness, mass, asymmetry or induration. Neurologic-- alert & oriented X3 and strength normal in all extremities. Psych-- Cognition and judgment appear intact. Alert and cooperative with normal attention span and concentration.  not anxious appearing and not depressed appearing.        Assessment & Plan:  Has a hard time working cutting his nails, will see his podiatry  Systolic murmur ?Chauncey Reading on RTC, consider echocardiogram

## 2012-07-08 NOTE — Assessment & Plan Note (Signed)
Symptoms well-controlled, has been taking trazodone for a while, not sure if that is "doing anything for him". Plan: Decrease dose of trazodone gradually, see instructions.

## 2012-07-09 ENCOUNTER — Encounter: Payer: Self-pay | Admitting: Internal Medicine

## 2012-07-22 ENCOUNTER — Encounter: Payer: Self-pay | Admitting: Neurology

## 2012-07-22 ENCOUNTER — Ambulatory Visit (INDEPENDENT_AMBULATORY_CARE_PROVIDER_SITE_OTHER): Payer: Medicare Other | Admitting: Neurology

## 2012-07-22 VITALS — BP 153/90 | HR 64 | Ht 69.0 in | Wt 198.0 lb

## 2012-07-22 DIAGNOSIS — G5620 Lesion of ulnar nerve, unspecified upper limb: Secondary | ICD-10-CM

## 2012-07-22 DIAGNOSIS — G609 Hereditary and idiopathic neuropathy, unspecified: Secondary | ICD-10-CM

## 2012-07-22 DIAGNOSIS — G629 Polyneuropathy, unspecified: Secondary | ICD-10-CM

## 2012-07-22 DIAGNOSIS — D518 Other vitamin B12 deficiency anemias: Secondary | ICD-10-CM

## 2012-07-22 DIAGNOSIS — G5621 Lesion of ulnar nerve, right upper limb: Secondary | ICD-10-CM

## 2012-07-22 HISTORY — DX: Lesion of ulnar nerve, unspecified upper limb: G56.20

## 2012-07-22 NOTE — Progress Notes (Signed)
Reason for visit: Peripheral neuropathy  Gene [name] is a 72 y.o. male  History of present illness:  Gene [name] is a 72 year old right-handed white male with a history of obesity and a history of alcohol overuse. The patient was seen last year for nerve conduction and EMG evaluation. The patient was found to have a primarily axonal peripheral neuropathy, and evidence of a left ulnar neuropathy. The patient did not have nerve conduction studies on the right arm. The patient returns with a gradual history of worsening weakness and numbness involving the right hand in the ulnar nerve distribution. The patient has noted that the right fifth finger is abducted from the hand. The patient has weakness with using the hands at times. The patient has some similar symptoms with the left hand, but the right hand is worse. The patient denies any significant discomfort in the neck, shoulders, or pain down arms. The patient denies any significant gait imbalance, and he has not had any falls. The patient denies problems controlling the bowels or the bladder. The patient is on Lyrica, and this has helped his symptoms in his feet. The patient comes to this office for an evaluation.  Past Medical History  Diagnosis Date  . Anxiety and depression 1995    symptoms started aprox 1995, after a divorce  . Hyperlipidemia     high TG  . Hypertension   . Neuropathy   . 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 Polyneuropathy in other diseases classified elsewhere   . Obesity     Past Surgical History  Procedure Laterality Date  . Cholecystectomy    . Tonsillectomy      Family History  Problem Relation Age of Onset  . Hyperlipidemia Mother   . Lung cancer Father     asbestos releated  . Colon polyps Father   . Diabetes    . Coronary artery disease Neg Hx   . Prostate cancer Neg Hx     Social history:  reports that he has never smoked. He does not have any smokeless tobacco  history on file. He reports that  drinks alcohol. He reports that he does not use illicit drugs.  Medications:  Current Outpatient Prescriptions on File Prior to Visit  Medication Sig Dispense Refill  . ALPRAZolam (XANAX) 0.5 MG tablet Take 1 tablet (0.5 mg total) by mouth 3 (three) times daily as needed for anxiety.  90 tablet  0  . aspirin 81 MG tablet Take 160 mg by mouth daily.      Marland Kitchen atorvastatin (LIPITOR) 20 MG tablet TAKE 1 TABLET BY MOUTH EVERY NIGHT AT BEDTIME  90 tablet  0  . citalopram (CELEXA) 20 MG tablet TAKE 2 TABLETS BY MOUTH DAILY  180 tablet  0  . furosemide (LASIX) 40 MG tablet TAKE 1 TABLET BY MOUTH DAILY  90 tablet  0  . losartan (COZAAR) 100 MG tablet TAKE ONE TABLET BY MOUTH DAILY  90 tablet  0  . LYRICA 75 MG capsule take 1 capsule by mouth twice a day  180 capsule  0   No current facility-administered medications on file prior to visit.    Allergies: No Known Allergies  ROS:  Out of a complete 14 system review of symptoms, the patient complains only of the following symptoms, and all other reviewed systems are negative.  Easy bruising Moles Joint pain Numbness  Blood pressure 153/90, pulse 64, height 5\' 9"  (1.753 m), weight 198 lb (  89.812 kg).  Physical Exam  General: The patient is alert and cooperative at the time of the examination. The patient is moderately obese.  Head: Pupils are equal, round, and reactive to light. Discs are flat bilaterally.  Neck: The neck is supple, no carotid bruits are noted.  Respiratory: The respiratory examination is clear.  Cardiovascular: The cardiovascular examination reveals a regular rate and rhythm, no obvious murmurs or rubs are noted.  Skin: Extremities are without significant edema.  Neurologic Exam  Mental status:  Cranial nerves: Facial symmetry is present. There is good sensation of the face to pinprick and soft touch bilaterally. The strength of the facial muscles and the muscles to head turning and  shoulder shrug are normal bilaterally. Speech is well enunciated, no aphasia or dysarthria is noted. Extraocular movements are full. Visual fields are full.  Motor: The motor testing reveals 5 over 5 strength of all 4 extremities. Good symmetric motor tone is noted throughout.  Sensory: Sensory testing is intact to pinprick, soft touch, vibration sensation, and position sense on all 4 extremities, with the exception that there is a pinprick sensory deficit in the distal third of the legs bilaterally. No evidence of extinction is noted.  Coordination: Cerebellar testing reveals good finger-nose-finger and heel-to-shin bilaterally.  Gait and station: Gait is normal. Tandem gait is normal. Romberg is negative. No drift is seen.  Reflexes: Deep tendon reflexes are symmetric and normal bilaterally, with the exception that the ankle jerk reflexes are absent bilaterally. Toes are downgoing bilaterally.   Assessment/Plan:  1. Peripheral neuropathy  2. Bilateral ulnar neuropathies  On his past medical history, the patient appears to have a history of alcohol overuse. This could potentially be a source of his neuropathy. The patient will be set up for repeat nerve conduction studies all 4 extremities, and EMG evaluation of the right upper extremity. The patient will get athletic elbow pads, and protect the ulnar groove during the day, and invert the pads at night to prevent over flexion at the elbows. The patient will have blood work done today looking for etiologies of peripheral neuropathies. The patient will followup for the EMG study.  Marlan Palau MD 07/24/2012 6:39 PM  [facility] [address] [place], [place] [address]  Phone [phone] Fax [phone]

## 2012-07-25 LAB — IFE AND PE, SERUM
Albumin SerPl Elph-Mcnc: 4.1 g/dL (ref 3.2–5.6)
Albumin/Glob SerPl: 1.7 (ref 0.7–2.0)
B-Globulin SerPl Elph-Mcnc: 0.9 g/dL (ref 0.6–1.3)
Gamma Glob SerPl Elph-Mcnc: 0.6 g/dL (ref 0.5–1.6)
Globulin, Total: 2.5 g/dL (ref 2.0–4.5)
IgA/Immunoglobulin A, Serum: 160 mg/dL (ref 91–414)
IgG (Immunoglobin G), Serum: 593 mg/dL — ABNORMAL LOW (ref 700–1600)

## 2012-07-25 LAB — ANGIOTENSIN CONVERTING ENZYME: Angio Convert Enzyme: 23 U/L (ref 14–82)

## 2012-07-25 LAB — VITAMIN B12: Vitamin B-12: >1999 pg/mL — ABNORMAL HIGH (ref 211–946)

## 2012-07-25 LAB — RHEUMATOID FACTOR: Rhuematoid fact SerPl-aCnc: 13.9 IU/mL (ref 0.0–13.9)

## 2012-07-25 LAB — ANA W/REFLEX: Anti Nuclear Antibody(ANA): NEGATIVE

## 2012-07-26 ENCOUNTER — Telehealth: Payer: Self-pay

## 2012-07-26 NOTE — Telephone Encounter (Signed)
Called pt, left vmail.

## 2012-07-26 NOTE — Telephone Encounter (Signed)
Message copied by Doree Barthel on Tue Jul 26, 2012  9:54 AM ------      Message from: Stephanie Acre      Created: Mon Jul 25, 2012  5:42 PM       Please call the patient. The blood work results are unremarkable. Thank you.            ----- Message -----         From: [facility] Lab Results In Interface         Sent: 07/25/2012   4:39 PM           To: York Spaniel, MD                   ------

## 2012-07-26 NOTE — Telephone Encounter (Signed)
Message copied by Doree Barthel on Tue Jul 26, 2012  9:59 AM ------      Message from: Stephanie Acre      Created: Mon Jul 25, 2012  5:42 PM       Please call the patient. The blood work results are unremarkable. Thank you.            ----- Message -----         From: [facility] Lab Results In Interface         Sent: 07/25/2012   4:39 PM           To: York Spaniel, MD                   ------

## 2012-08-12 ENCOUNTER — Encounter (INDEPENDENT_AMBULATORY_CARE_PROVIDER_SITE_OTHER): Payer: Medicare Other

## 2012-08-12 ENCOUNTER — Ambulatory Visit (INDEPENDENT_AMBULATORY_CARE_PROVIDER_SITE_OTHER): Payer: Medicare Other | Admitting: Neurology

## 2012-08-12 DIAGNOSIS — G63 Polyneuropathy in diseases classified elsewhere: Secondary | ICD-10-CM

## 2012-08-12 DIAGNOSIS — Z0289 Encounter for other administrative examinations: Secondary | ICD-10-CM

## 2012-08-12 DIAGNOSIS — G629 Polyneuropathy, unspecified: Secondary | ICD-10-CM

## 2012-08-12 DIAGNOSIS — G5621 Lesion of ulnar nerve, right upper limb: Secondary | ICD-10-CM

## 2012-08-12 DIAGNOSIS — G5620 Lesion of ulnar nerve, unspecified upper limb: Secondary | ICD-10-CM

## 2012-08-12 NOTE — Procedures (Signed)
  HISTORY:  Gene [name] is a 72 year old gentleman with a history of alcohol overuse, and long-standing use of statin medications for dyslipidemia. The patient has developed some numbness and weakness in the right arm consistent with an ulnar neuropathy. The patient is being evaluated for this neuropathy.  NERVE CONDUCTION STUDIES:  Nerve conduction studies were performed on all 4 extremities. The distal motor latencies for the median and ulnar nerves were normal bilaterally, with normal motor amplitudes for the median nerves bilaterally, low for the ulnar nerves bilaterally. The F wave latencies and nerve conduction velocities for the median nerves are normal bilaterally. The F wave latencies for the ulnar nerves are prolonged bilaterally, with marked slowing of the right ulnar nerve associated with a drop-off in motor amplitude across the elbow. There is mild slowing of the left ulnar nerve below the elbow. The sensory latencies for the median nerves are normal bilaterally, absent for the right ulnar nerve and prolonged for the left ulnar nerve.  Nerve conduction studies done on both lower extremities shows no response for the right peroneal nerve, with a normal distal motor latency for the left peroneal nerve with a low motor amplitude for this nerve. The distal motor latencies for the posterior tibial nerves were normal bilaterally, with low motor amplitudes for these nerves bilaterally. Slowing was seen for the left posterior tibial nerve, normal nerve conduction velocity on the right posterior tibial nerve and normal nerve conduction velocities for the left peroneal nerve. The peroneal sensory latencies were absent bilaterally.  EMG STUDIES:  EMG study was performed on the right upper extremity:  The first dorsal interosseous muscle reveals 2 to 4 K units with moderately reduced recruitment. 2+ fibrillations and positive waves were noted. The abductor pollicis brevis muscle reveals 2 to 5 K  units with decreased recruitment. No fibrillations or positive waves were noted. The extensor indicis proprius muscle reveals 1 to 3 K units with full recruitment. No fibrillations or positive waves were noted. The pronator teres muscle reveals 2 to 3 K units with full recruitment. No fibrillations or positive waves were noted. The flexor digitorum profundus muscle (III-IV) reveals 2 to 5K units with decreased recruitment. No fibrillations or positive waves were seen. The biceps muscle reveals 1 to 2 K units with full recruitment. No fibrillations or positive waves were noted. The triceps muscle reveals 2 to 4 K units with full recruitment. No fibrillations or positive waves were noted. The anterior deltoid muscle reveals 2 to 3 K units with full recruitment. No fibrillations or positive waves were noted. The cervical paraspinal muscles were tested at 2 levels. No abnormalities of insertional activity were seen at either level tested. There was good relaxation.    IMPRESSION:  Nerve conduction studies done on all 4 extremities shows evidence of a primarily axonal peripheral neuropathy of moderate severity. In comparison to a prior study done on 05/08/2011, there has been progression in the severity of the neuropathy on this evaluation. There is evidence of bilateral tardy ulnar neuropathies, right greater than left. EMG evaluation of the right upper extremity shows findings consistent with a right tardy ulnar neuropathy. No evidence of a right cervical radiculopathy is seen.  Marlan Palau MD 08/12/2012 4:27 PM  [facility] [address] [place], [place] [address]  Phone [phone] Fax [phone]

## 2012-08-12 NOTE — Progress Notes (Signed)
[name] is a 72 year old gentleman with a history of alcohol overuse and dyslipidemia. The patient has a history of a peripheral neuropathy, and comes in with symptoms of a right ulnar neuropathy. 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 The patient questions whether or not the statin drugs over time have created the peripheral neuropathy which clearly has progressed over the last one year. This is a possibility, but it is not clear that the patient can come off of the statin drugs. The patient may discuss this with his primary care physician. The patient is to get an elbow pad to protect the right elbow for the day, and burning up at night to keep the arm from going into full flexion. The patient followup in 4 or 5 months.

## 2012-08-15 ENCOUNTER — Other Ambulatory Visit: Payer: Self-pay | Admitting: Internal Medicine

## 2012-08-15 NOTE — Telephone Encounter (Signed)
07-08-12 Last OV, last filled #90

## 2012-08-17 ENCOUNTER — Telehealth: Payer: Self-pay | Admitting: *Deleted

## 2012-08-17 MED ORDER — ALPRAZOLAM 0.5 MG PO TABS: 0.5000 mg | ORAL_TABLET | Freq: Three times a day (TID) | ORAL | Status: DC | PRN

## 2012-08-17 NOTE — Telephone Encounter (Signed)
07-08-12 Last OV, Last filled 07-08-12 #90

## 2012-08-17 NOTE — Telephone Encounter (Signed)
Patient is calling to follow up on his refill request for ALPRAZolam. Patient wants to know when this will be filled because he is completely out.

## 2012-08-17 NOTE — Telephone Encounter (Signed)
Refill done.  

## 2012-08-17 NOTE — Telephone Encounter (Signed)
Ok 90 and 2 RF 

## 2012-08-28 ENCOUNTER — Other Ambulatory Visit: Payer: Self-pay | Admitting: Internal Medicine

## 2012-08-29 NOTE — Telephone Encounter (Signed)
Refill done.  

## 2012-11-10 ENCOUNTER — Other Ambulatory Visit: Payer: Self-pay | Admitting: Internal Medicine

## 2012-11-10 NOTE — Telephone Encounter (Signed)
rx requestPrudy [name] 0.5mg  Last OV-07/08/12 Last given 08/17/12 #90 / 2refills  UDS contract-04/15/12  Please advise. DJR

## 2012-11-11 ENCOUNTER — Other Ambulatory Visit: Payer: Self-pay | Admitting: *Deleted

## 2012-11-11 NOTE — Telephone Encounter (Signed)
UDS low risk 2-14 RF done  Let patient know he is due for a physical before next refill.

## 2012-11-11 NOTE — Telephone Encounter (Signed)
Pt notified of prescription request and to schedule a physical before next refill via telephone. DJR

## 2012-12-01 ENCOUNTER — Other Ambulatory Visit: Payer: Self-pay | Admitting: Internal Medicine

## 2012-12-01 NOTE — Telephone Encounter (Signed)
rx refilled per protocol. DJR  

## 2012-12-14 ENCOUNTER — Other Ambulatory Visit: Payer: Self-pay | Admitting: Internal Medicine

## 2012-12-15 NOTE — Telephone Encounter (Signed)
rx refilled per protocol. DJR  

## 2013-01-06 ENCOUNTER — Encounter: Payer: 59 | Admitting: Internal Medicine

## 2013-01-18 ENCOUNTER — Telehealth: Payer: Self-pay

## 2013-01-18 NOTE — Telephone Encounter (Addendum)
Left message for call back Non identifiable Medication List and allergies: reviewed and updated  90 day supply/mail order: na Local prescriptions: [facility] [address]  Immunizations due: UTD  A/P:   No changes to FH or PSH Flu vaccine--11/2012 Tdap--09/2006 Pneumonia--07/2009 CCS--03/2009 next due 2016 PSA--09/2010--1.63 Has not had shingles vaccine  To Discuss with Provider: Not at this time

## 2013-01-19 ENCOUNTER — Other Ambulatory Visit: Payer: Self-pay | Admitting: Internal Medicine

## 2013-01-19 ENCOUNTER — Telehealth: Payer: Self-pay | Admitting: *Deleted

## 2013-01-19 NOTE — Telephone Encounter (Signed)
Error

## 2013-01-20 ENCOUNTER — Ambulatory Visit (INDEPENDENT_AMBULATORY_CARE_PROVIDER_SITE_OTHER): Payer: Medicare Other | Admitting: Internal Medicine

## 2013-01-20 ENCOUNTER — Telehealth: Payer: Self-pay | Admitting: *Deleted

## 2013-01-20 ENCOUNTER — Encounter: Payer: Self-pay | Admitting: Internal Medicine

## 2013-01-20 ENCOUNTER — Other Ambulatory Visit: Payer: Self-pay | Admitting: *Deleted

## 2013-01-20 VITALS — BP 164/96 | HR 61 | Temp 97.8°F | Ht 68.5 in | Wt 207.0 lb

## 2013-01-20 DIAGNOSIS — I10 Essential (primary) hypertension: Secondary | ICD-10-CM

## 2013-01-20 DIAGNOSIS — F341 Dysthymic disorder: Secondary | ICD-10-CM

## 2013-01-20 DIAGNOSIS — Z0000 Encounter for general adult medical examination without abnormal findings: Secondary | ICD-10-CM | POA: Diagnosis not present

## 2013-01-20 DIAGNOSIS — E785 Hyperlipidemia, unspecified: Secondary | ICD-10-CM

## 2013-01-20 DIAGNOSIS — G629 Polyneuropathy, unspecified: Secondary | ICD-10-CM

## 2013-01-20 DIAGNOSIS — G609 Hereditary and idiopathic neuropathy, unspecified: Secondary | ICD-10-CM | POA: Diagnosis not present

## 2013-01-20 DIAGNOSIS — F329 Major depressive disorder, single episode, unspecified: Secondary | ICD-10-CM

## 2013-01-20 DIAGNOSIS — I739 Peripheral vascular disease, unspecified: Secondary | ICD-10-CM

## 2013-01-20 DIAGNOSIS — Z125 Encounter for screening for malignant neoplasm of prostate: Secondary | ICD-10-CM

## 2013-01-20 LAB — COMPREHENSIVE METABOLIC PANEL
ALT: 28 U/L (ref 0–53)
AST: 34 U/L (ref 0–37)
Albumin: 4 g/dL (ref 3.5–5.2)
Alkaline Phosphatase: 43 U/L (ref 39–117)
Glucose, Bld: 102 mg/dL — ABNORMAL HIGH (ref 70–99)
Potassium: 3.7 mEq/L (ref 3.5–5.1)
Sodium: 136 mEq/L (ref 135–145)
Total Bilirubin: 0.8 mg/dL (ref 0.3–1.2)
Total Protein: 6.6 g/dL (ref 6.0–8.3)

## 2013-01-20 LAB — CBC WITH DIFFERENTIAL/PLATELET
Basophils Relative: 0.6 % (ref 0.0–3.0)
Eosinophils Absolute: 0.1 10*3/uL (ref 0.0–0.7)
HCT: 40.6 % (ref 39.0–52.0)
Hemoglobin: 13.9 g/dL (ref 13.0–17.0)
Lymphs Abs: 1.4 10*3/uL (ref 0.7–4.0)
MCHC: 34.2 g/dL (ref 30.0–36.0)
MCV: 95 fl (ref 78.0–100.0)
Monocytes Absolute: 0.5 10*3/uL (ref 0.1–1.0)
Neutrophils Relative %: 53.7 % (ref 43.0–77.0)
Platelets: 176 10*3/uL (ref 150.0–400.0)
RBC: 4.27 Mil/uL (ref 4.22–5.81)
WBC: 4.3 10*3/uL — ABNORMAL LOW (ref 4.5–10.5)

## 2013-01-20 LAB — LIPID PANEL
Total CHOL/HDL Ratio: 4
Triglycerides: 538 mg/dL — ABNORMAL HIGH (ref 0.0–149.0)

## 2013-01-20 LAB — TSH: TSH: 3.03 u[IU]/mL (ref 0.35–5.50)

## 2013-01-20 LAB — LDL CHOLESTEROL, DIRECT: Direct LDL: 68.2 mg/dL

## 2013-01-20 LAB — PSA: PSA: 0.88 ng/mL (ref 0.10–4.00)

## 2013-01-20 MED ORDER — PREGABALIN 75 MG PO CAPS: ORAL_CAPSULE | ORAL | Status: DC

## 2013-01-20 MED ORDER — ALPRAZOLAM 0.5 MG PO TABS: ORAL_TABLET | ORAL | Status: DC

## 2013-01-20 MED ORDER — LOSARTAN POTASSIUM 100 MG PO TABS: ORAL_TABLET | ORAL | Status: DC

## 2013-01-20 MED ORDER — EZETIMIBE 10 MG PO TABS: 10.0000 mg | ORAL_TABLET | Freq: Every day | ORAL | Status: DC

## 2013-01-20 NOTE — Assessment & Plan Note (Addendum)
H/o  peripheral neuropathy, patient is concerned about the use of statins, Literature reviewed: "As such, a causal association between statin use and neuropathy remains possible but has not been proven". I'm not opposed to stop statins (but discussed benefits), I'm not sure if he will be help symptomatically but if Lipitor is causing neuropathy it may stop the nerve damage. Other options to treat cholesterol are   Zetia, Niaspan, diet and exercise. Addendum: decided to hold statins, and likes to try something else, will call zetia;  RTC in 4 months fasting, will do labs and decide from there

## 2013-01-20 NOTE — Telephone Encounter (Signed)
Please see phone note regarding starting of Zetia.

## 2013-01-20 NOTE — Assessment & Plan Note (Signed)
Td 08 Flu 9-14 pneumonia shot 2011 zostavax ~2012   several Cscopes, see PMH, last 03-2009 next 2016 DRE done 5-14, check a PSA labs   diet and exercise discussed

## 2013-01-20 NOTE — Progress Notes (Signed)
Pre visit review using our clinic review tool, if applicable. No additional management support is needed unless otherwise documented below in the visit note. 

## 2013-01-20 NOTE — Assessment & Plan Note (Addendum)
Carries the dx of  PVD, last seen by cardiology in 2012, intolerant to Pletal. Currently strategy is cardiovascular risk factors control and encouraged exercise. Reports no claudication

## 2013-01-20 NOTE — Telephone Encounter (Signed)
Patient called and would like to know if there is a new cholesterol medication that was supposed to be ordered today after his CPE? Please advise.

## 2013-01-20 NOTE — Telephone Encounter (Signed)
We agreed to stop his cholesterol medication, recheck his cholesterol in 4 months and see if he needs another medication

## 2013-01-20 NOTE — Progress Notes (Signed)
Subjective:    Patient ID: Gene [name], male    DOB: [birth year], 72 y.o.   MRN: [id number]  HPI  Here for Medicare AWV: 1. Risk factors based on Past M, S, F history: reviewed 2. Physical Activities: a little more active than last year (issues w/  , DJD and  neuropathy )   3. Depression/mood: sx well controlled on med  4. 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 ADL's:  Independent   6. Fall Risk: no recent problems, prevention discussed  7. home Safety: does feelsafe at home   8. Height, weight, &visual acuity: see VS, slt decrease, sees Dr Nile Riggs q year 9. Counseling: provided 10. Labs ordered based on risk factors: if needed   11. Referral Coordination: if needed 12.  Care Plan, see assessment and plan   13.   Cognitive Assessment: motor skills and cognition wnl   In addition, we discussed the following Hypertension, good compliance of medication, BP today elevated, reports this morning he is somehow upset about some issues, when he checks in the ambulatory setting BPs usually okay High cholesterol--good medication compliance Neuropathy--symptomatically he feels better, he is very concerned about the link between statins and neuropathy, see assessment and plan Takes Xanax as prescribed with good results per patient.    Past Medical History  Diagnosis Date  . Anxiety and depression 1995    symptoms started aprox 1995, after a divorce  . Hyperlipidemia     high TG  . Hypertension   . Neuropathy   . 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 History of alcohol abuse   . PVD (peripheral vascular disease)     PVD, mild saw Dr Allyson Sabal 2012, intolerant to pletal, Rx observation   Past Surgical History  Procedure Laterality Date  . Cholecystectomy    . Tonsillectomy     History   Social History  . Marital Status: Divorced    Spouse Name: N/A    Number of Children: 3  . Years of Education: College   Occupational History  . 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 Smoking status: Never Smoker   . Smokeless tobacco: Never Used  . Alcohol Use: 0.0 oz/week     Comment: h/o abuse, now on weekends only  . Drug Use: No  . Sexual Activity: Not on file   Other Topics Concern  . Not on file   Social History Narrative    Divorced since 1994; lives by himself, 3 sons, 1 GD  (all live close by)   [place] veteran     Family History  Problem Relation Age of Onset  . Hyperlipidemia Mother   . Lung cancer Father     asbestos releated  . Colon polyps Father   . Diabetes Neg Hx   . Coronary artery disease Neg Hx   . Prostate cancer Neg Hx         Review of Systems Diet-- regular  No  CP, SOB, lower extremity edema Denies  nausea, vomiting diarrhea Denies  blood in the stools No dysphagia or odynophagia (-) cough, sputum production No dysuria, gross hematuria, difficulty urinating        Objective:   Physical Exam BP 164/96  Pulse 61  Temp(Src) 97.8 F (36.6 C)  Ht 5' 8.5" (1.74 m)  Wt 207 lb (93.895 kg)  BMI  31.01 kg/m2  SpO2 96% General -- alert, well-developed, NAD.  Neck --no thyromegaly , normal carotid pulse  Lungs -- normal respiratory effort, no intercostal retractions, no accessory muscle use, and normal breath sounds.  Heart-- normal rate, regular rhythm, no murmur.  Abdomen-- Not distended, good bowel sounds,soft, non-tender. DRE-- done 5-14 Extremities-- no pretibial edema bilaterally  Neurologic--  alert & oriented X3. Speech normal, gait normal, strength normal in all extremities.  Psych-- Cognition and judgment appear intact. Cooperative with normal attention span and concentration. No anxious appearing , no depressed appearing.      Assessment & Plan:

## 2013-01-20 NOTE — Patient Instructions (Signed)
Get your blood work before you leave  Next visit in 4 months   for a   hypertensio, highcholesterol follow up (30 minutes). Fasting Please make an appointment    Check the  blood pressure   Weekly  be sure it is between 110/60 and 140/85. Ideal blood pressure is 120/80. If it is consistently higher or lower, let me know   Fall Prevention and Home Safety Falls cause injuries and can affect all age groups. It is possible to use preventive measures to significantly decrease the likelihood of falls. There are many simple measures which can make your home safer and prevent falls. OUTDOORS  Repair cracks and edges of walkways and driveways.  Remove high doorway thresholds.  Trim shrubbery on the main path into your home.  Have good outside lighting.  Clear walkways of tools, rocks, debris, and clutter.  Check that handrails are not broken and are securely fastened. Both sides of steps should have handrails.  Have leaves, snow, and ice cleared regularly.  Use sand or salt on walkways during winter months.  In the garage, clean up grease or oil spills. BATHROOM  Install night lights.  Install grab bars by the toilet and in the tub and shower.  Use non-skid mats or decals in the tub or shower.  Place a plastic non-slip stool in the shower to sit on, if needed.  Keep floors dry and clean up all water on the floor immediately.  Remove soap buildup in the tub or shower on a regular basis.  Secure bath mats with non-slip, double-sided rug tape.  Remove throw rugs and tripping hazards from the floors. BEDROOMS  Install night lights.  Make sure a bedside light is easy to reach.  Do not use oversized bedding.  Keep a telephone by your bedside.  Have a firm chair with side arms to use for getting dressed.  Remove throw rugs and tripping hazards from the floor. KITCHEN  Keep handles on pots and pans turned toward the center of the stove. Use back burners when  possible.  Clean up spills quickly and allow time for drying.  Avoid walking on wet floors.  Avoid hot utensils and knives.  Position shelves so they are not too high or low.  Place commonly used objects within easy reach.  If necessary, use a sturdy step stool with a grab bar when reaching.  Keep electrical cables out of the way.  Do not use floor polish or wax that makes floors slippery. If you must use wax, use non-skid floor wax.  Remove throw rugs and tripping hazards from the floor. STAIRWAYS  Never leave objects on stairs.  Place handrails on both sides of stairways and use them. Fix any loose handrails. Make sure handrails on both sides of the stairways are as long as the stairs.  Check carpeting to make sure it is firmly attached along stairs. Make repairs to worn or loose carpet promptly.  Avoid placing throw rugs at the top or bottom of stairways, or properly secure the rug with carpet tape to prevent slippage. Get rid of throw rugs, if possible.  Have an electrician put in a light switch at the top and bottom of the stairs. OTHER FALL PREVENTION TIPS  Wear low-heel or rubber-soled shoes that are supportive and fit well. Wear closed toe shoes.  When using a stepladder, make sure it is fully opened and both spreaders are firmly locked. Do not climb a closed stepladder.  Add color or contrast  paint or tape to grab bars and handrails in your home. Place contrasting color strips on first and last steps.  Learn and use mobility aids as needed. Install an electrical emergency response system.  Turn on lights to avoid dark areas. Replace light bulbs that burn out immediately. Get light switches that glow.  Arrange furniture to create clear pathways. Keep furniture in the same place.  Firmly attach carpet with non-skid or double-sided tape.  Eliminate uneven floor surfaces.  Select a carpet pattern that does not visually hide the edge of steps.  Be aware of all  pets. OTHER HOME SAFETY TIPS  Set the water temperature for 120 F (48.8 C).  Keep emergency numbers on or near the telephone.  Keep smoke detectors on every level of the home and near sleeping areas. Document Released: 02/13/2002 Document Revised: 08/25/2011 Document Reviewed: 05/15/2011 [facility] Patient Information 2014 [employer], [place].

## 2013-01-20 NOTE — Assessment & Plan Note (Addendum)
H/o  peripheral neuropathy, patient is concerned about the use of statins, Literature reviewed: "As such, a causal association between statin use and neuropathy remains possible but has not been proven". I'm not opposed to stop statins (but discussed benefits), I'm not sure if he will be help symptomatically but if Lipitor is causing neuropathy it may stop the nerve damage. Other options to treat cholesterol are   Zetia, Niaspan, diet and exercise. Addendum: decided to hold statins, and likes to try something else, will call zetia;  RTC in 4 months fasting, will do labs and decide from there 

## 2013-01-20 NOTE — Assessment & Plan Note (Signed)
Symptoms well-controlled. UDS negative 04-2012, next UDS on return to the office

## 2013-01-20 NOTE — Assessment & Plan Note (Addendum)
Good compliance with medications, check a CMP, BP today elevated recommend ambulatory BPs

## 2013-01-20 NOTE — Telephone Encounter (Signed)
Called and spoke with patient regarding cholesterol medication. Patient insisted that a new cholesterol medication would be started since the Lipitor was discontinued. I spoke with Dr. Drue Novel and he agreed to start patient, per patient request, on Zetia 10mg  one by mouth daily. Patient was ok with this change.

## 2013-01-23 ENCOUNTER — Encounter: Payer: Self-pay | Admitting: Internal Medicine

## 2013-01-24 ENCOUNTER — Other Ambulatory Visit: Payer: Self-pay | Admitting: Internal Medicine

## 2013-01-24 ENCOUNTER — Telehealth: Payer: Self-pay | Admitting: *Deleted

## 2013-01-24 MED ORDER — ATORVASTATIN CALCIUM 20 MG PO TABS: ORAL_TABLET | ORAL | Status: DC

## 2013-01-24 NOTE — Telephone Encounter (Signed)
Spoke to pt regarding his refill on Lipitor. Pt wants to know if theres another non-statin alternative that's less cost effective before being put back on Lipitor. Please advise. DJR

## 2013-01-24 NOTE — Telephone Encounter (Signed)
Message copied by Eustace Quail on Tue Jan 24, 2013  3:44 PM ------      Message from: [name] E      Created: Tue Jan 24, 2013  3:18 PM       Onalee Hua,      Patient likes to restart Lipitor, call a prescription 30 and 6 refills. ------

## 2013-01-24 NOTE — Telephone Encounter (Signed)
Please advise 

## 2013-01-24 NOTE — Telephone Encounter (Signed)
Pravachol 40 mg one by mouth each bedtime #30 and 3 refills FLP, AST, ALT 2 months after he starts Pravachol     Dx  hyperlipidemia

## 2013-01-25 ENCOUNTER — Other Ambulatory Visit: Payer: Self-pay | Admitting: *Deleted

## 2013-01-25 MED ORDER — PRAVASTATIN SODIUM 40 MG PO TABS: 40.0000 mg | ORAL_TABLET | Freq: Every day | ORAL | Status: DC

## 2013-01-25 NOTE — Telephone Encounter (Signed)
Pravachol ordered. Pt contacted.

## 2013-01-31 ENCOUNTER — Telehealth: Payer: Self-pay | Admitting: *Deleted

## 2013-01-31 NOTE — Telephone Encounter (Signed)
UDS results on 01/20/13 deemed low risk by Dr. Drue Novel

## 2013-03-17 ENCOUNTER — Other Ambulatory Visit: Payer: Self-pay | Admitting: Internal Medicine

## 2013-03-21 ENCOUNTER — Encounter: Payer: Self-pay | Admitting: Internal Medicine

## 2013-03-21 ENCOUNTER — Other Ambulatory Visit: Payer: Self-pay | Admitting: Internal Medicine

## 2013-03-22 ENCOUNTER — Telehealth: Payer: Self-pay | Admitting: *Deleted

## 2013-03-22 MED ORDER — ALPRAZOLAM 0.5 MG PO TABS: ORAL_TABLET | ORAL | Status: DC

## 2013-03-22 NOTE — Telephone Encounter (Signed)
rx refill- xanax 0.5mg   Last OV- 01/20/13 Last refilled - 01/20/13 #90 / 1 rf  Last UDS - 01/20/13 LOW risk

## 2013-03-22 NOTE — Telephone Encounter (Signed)
Done

## 2013-03-22 NOTE — Telephone Encounter (Deleted)
rx refill- xanax 0.5mg  Last OV- 01/20/13 Last refilled - 01/20/13 #90 / 1 rf  Last UDS - 01/20/13 LOW risk  

## 2013-03-22 NOTE — Addendum Note (Signed)
Addended by: Willow OraPAZ, Malak Orantes E on: 03/22/2013 12:07 PM   Modules accepted: Orders

## 2013-03-27 ENCOUNTER — Encounter: Payer: Self-pay | Admitting: Internal Medicine

## 2013-04-20 ENCOUNTER — Other Ambulatory Visit: Payer: Self-pay | Admitting: Internal Medicine

## 2013-05-23 ENCOUNTER — Other Ambulatory Visit: Payer: Self-pay | Admitting: Internal Medicine

## 2013-05-23 NOTE — Telephone Encounter (Signed)
Refill for Alprazolam 0.5mg  Last Rx 03/21/2013 #90 with 1 refill UDS 01/2013 Low Risk

## 2013-05-25 ENCOUNTER — Other Ambulatory Visit: Payer: Self-pay | Admitting: *Deleted

## 2013-05-25 MED ORDER — ALPRAZOLAM 0.5 MG PO TABS: ORAL_TABLET | ORAL | Status: DC

## 2013-05-25 NOTE — Telephone Encounter (Signed)
Please advise 

## 2013-05-25 NOTE — Telephone Encounter (Signed)
Script printed and faxed to [facility] on [address]. Patient aware. JG//CMA

## 2013-05-25 NOTE — Telephone Encounter (Signed)
Ok to fill X 1. No refills.

## 2013-06-22 ENCOUNTER — Other Ambulatory Visit: Payer: Self-pay | Admitting: Internal Medicine

## 2013-06-23 ENCOUNTER — Ambulatory Visit (INDEPENDENT_AMBULATORY_CARE_PROVIDER_SITE_OTHER): Payer: 59 | Admitting: Internal Medicine

## 2013-06-23 ENCOUNTER — Encounter: Payer: Self-pay | Admitting: Internal Medicine

## 2013-06-23 VITALS — BP 169/84 | HR 85 | Temp 97.9°F | Wt 202.6 lb

## 2013-06-23 DIAGNOSIS — F341 Dysthymic disorder: Secondary | ICD-10-CM

## 2013-06-23 DIAGNOSIS — E785 Hyperlipidemia, unspecified: Secondary | ICD-10-CM

## 2013-06-23 DIAGNOSIS — I10 Essential (primary) hypertension: Secondary | ICD-10-CM

## 2013-06-23 DIAGNOSIS — F32A Depression, unspecified: Secondary | ICD-10-CM

## 2013-06-23 DIAGNOSIS — F329 Major depressive disorder, single episode, unspecified: Secondary | ICD-10-CM

## 2013-06-23 DIAGNOSIS — F419 Anxiety disorder, unspecified: Secondary | ICD-10-CM

## 2013-06-23 LAB — LIPID PANEL
CHOL/HDL RATIO: 4
Cholesterol: 167 mg/dL (ref 0–200)
HDL: 47.4 mg/dL (ref >39.00)
LDL Cholesterol: 17 mg/dL (ref 0–99)
Triglycerides: 515 mg/dL — ABNORMAL HIGH (ref 0.0–149.0)
VLDL: 103 mg/dL — AB (ref 0.0–40.0)

## 2013-06-23 LAB — ALT: ALT: 41 U/L (ref 0–53)

## 2013-06-23 LAB — AST: AST: 55 U/L — AB (ref 0–37)

## 2013-06-23 MED ORDER — AMLODIPINE BESYLATE 5 MG PO TABS: 5.0000 mg | ORAL_TABLET | Freq: Every day | ORAL | Status: DC

## 2013-06-23 MED ORDER — ATORVASTATIN CALCIUM 20 MG PO TABS: ORAL_TABLET | ORAL | Status: DC

## 2013-06-23 MED ORDER — TRAZODONE HCL 50 MG PO TABS: ORAL_TABLET | ORAL | Status: DC

## 2013-06-23 MED ORDER — ALPRAZOLAM 0.5 MG PO TABS: ORAL_TABLET | ORAL | Status: DC

## 2013-06-23 MED ORDER — CITALOPRAM HYDROBROMIDE 20 MG PO TABS: ORAL_TABLET | ORAL | Status: DC

## 2013-06-23 NOTE — Progress Notes (Signed)
Subjective:    Patient ID: Gene FretWilliam [name], male    DOB: Jun 11, 1940, 73 y.o.   MRN: [id number]  DOS:  06/23/2013 Type of  visit: ROV Hypertension slightly elevated lately, BP usually 145, 165. Diastolic usually normal. High cholesterol, see assessment and plan. Needs refill on Xanax   ROS Denies chest pain, difficulty breathing. No lower extremity edema or palpitations  Past Medical History  Diagnosis Date  . Anxiety and depression 1995    symptoms started aprox 1995, after a divorce  . Hyperlipidemia     high TG  . Hypertension   . Neuropathy   . 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 History of alcohol abuse   . PVD (peripheral vascular disease)     PVD, mild saw Dr Allyson SabalBerry 2012, intolerant to pletal, Rx observation    Past Surgical History  Procedure Laterality Date  . Cholecystectomy    . Tonsillectomy      History   Social History  . Marital Status: Divorced    Spouse Name: N/A    Number of Children: 3  . Years of Education: College   Occupational History  . 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 Smoking status: Never Smoker   . Smokeless tobacco: Never Used  . Alcohol Use: 0.0 oz/week     Comment: h/o abuse, now on weekends only  . Drug Use: No  . Sexual Activity: Not on file   Other Topics Concern  . Not on file   Social History Narrative    Divorced since 1994; lives by himself, 3 sons, 1 GD  (all live close by)   [place]          Medication List       This list is accurate as of: 06/23/13 11:59 PM.  Always use your most recent med list.               ALPHA LIPOIC ACID PO  Take 600 mg by mouth.     ALPRAZolam 0.5 MG tablet  Commonly known as:  XANAX  take 1 tablet by mouth three times a day if needed for anxiety     amLODipine 5 MG tablet  Commonly known as:  NORVASC  Take 1 tablet (5 mg total) by mouth daily.     aspirin 81 MG tablet  Take  160 mg by mouth daily.     atorvastatin 20 MG tablet  Commonly known as:  LIPITOR  TAKE 1 TABLET BY MOUTH EVERY NIGHT AT BEDTIME     b complex vitamins capsule  Take 1 capsule by mouth daily.     citalopram 20 MG tablet  Commonly known as:  CELEXA  take 2 tablets by mouth once daily     Krill Oil 1000 MG Caps  Take by mouth.     losartan 100 MG tablet  Commonly known as:  COZAAR  TAKE ONE TABLET BY MOUTH DAILY     pregabalin 75 MG capsule  Commonly known as:  LYRICA  take 1 capsule by mouth twice a day     traZODone 50 MG tablet  Commonly known as:  DESYREL  Take 1/2 tablet by mouth at bedtime.     VITAMIN B1-B12 PO  Take 2,000 mg by mouth.           Objective:   Physical Exam BP 169/84  Pulse 85  Temp(Src) 97.9 F (36.6 C)  Wt 202 lb 9.6 oz (91.899 kg)  SpO2 96% General -- alert, well-developed, NAD.  Lungs -- normal respiratory effort, no intercostal retractions, no accessory muscle use, and normal breath sounds.  Heart-- normal rate, regular rhythm, no murmur.   Extremities-- no pretibial edema bilaterally  Neurologic--  alert & oriented X3. Speech normal, gait normal, strength normal in all extremities.    Psych-- Cognition and judgment appear intact. Cooperative with normal attention span and concentration. No anxious or depressed appearing.     Assessment & Plan:

## 2013-06-23 NOTE — Progress Notes (Signed)
Pre visit review using our clinic review tool, if applicable. No additional management support is needed unless otherwise documented below in the visit note. 

## 2013-06-23 NOTE — Assessment & Plan Note (Signed)
Needs better control, add amlodipine, monitor BPs, RTC 4 months

## 2013-06-23 NOTE — Patient Instructions (Signed)
Get your blood work before you leave   Next visit is for routine check up regards your blood sugar , cholesterol in 4 months   No need to come back fasting Please make an appointment    Check the  blood pressure 2 or 3 times a  week be sure it is between 110/60 and 140/85. Ideal blood pressure is 120/80. If it is consistently higher or lower, let me know

## 2013-06-23 NOTE — Assessment & Plan Note (Signed)
UDS low 01-2013, RF xanax

## 2013-06-23 NOTE — Assessment & Plan Note (Addendum)
Since the last time he was here, he stopped Lipitor, neuropathy pain did not improve, he try pravachol x a while, eventually decided to go back on Lipitor because it worked for him in the past. Self discontinue Zetia due to cost. Triglycerides were elevated, denies excessive alcohol intake. Taking krill oil Plan: Labs

## 2013-06-26 ENCOUNTER — Telehealth: Payer: Self-pay | Admitting: Internal Medicine

## 2013-06-26 MED ORDER — FENOFIBRATE 160 MG PO TABS: 160.0000 mg | ORAL_TABLET | Freq: Every day | ORAL | Status: DC

## 2013-06-26 NOTE — Addendum Note (Signed)
Addended by: Eustace QuailEABOLD, Katarzyna Wolven J on: 06/26/2013 11:27 AM   Modules accepted: Orders

## 2013-06-26 NOTE — Telephone Encounter (Signed)
Relevant patient education assigned to patient using Emmi. ° °

## 2013-07-20 ENCOUNTER — Other Ambulatory Visit: Payer: Self-pay | Admitting: Internal Medicine

## 2013-09-04 ENCOUNTER — Encounter: Payer: Self-pay | Admitting: Podiatry

## 2013-09-04 ENCOUNTER — Ambulatory Visit (INDEPENDENT_AMBULATORY_CARE_PROVIDER_SITE_OTHER): Payer: Medicare Other | Admitting: Podiatry

## 2013-09-04 VITALS — BP 99/61 | HR 72 | Resp 15 | Ht 69.5 in | Wt 205.0 lb

## 2013-09-04 DIAGNOSIS — B351 Tinea unguium: Secondary | ICD-10-CM

## 2013-09-04 NOTE — Progress Notes (Signed)
   Subjective:    Patient ID: Gene FretWilliam [name], male    DOB: Dec 03, 1940, 73 y.o.   MRN: [id number]  HPI Comments: N debridement L 10 toenails D and O long-term C thick, elongated toenails A difficulty cutting T hx of home pedicure     Review of Systems  All other systems reviewed and are negative.      Objective:   Physical Exam Orientated x3 white male  Vascular: DP pulses 2/4 bilaterally PT pulses 1/4 bilaterally  Neurological: Sensation to 10 g monofilament are intact 3/5 right and 2/5 left Vibratory sensation nonreactive bilaterally Ankle reflexes equal and reactive bilaterally  Dermatological: The toenails are elongated, discolored, hypertrophic x10  Musculoskeletal: Hammertoe deformities 2-5bilaterally No restriction  ankle, subtalar,midtarsal joints bilaterally       Assessment & Plan:   Assessment: Peripheral neuropathy bilaterally Diminished pedal pulses consistent with previously diagnosed peripheral arterial disease Onychomycoses 6-10  Plan: Nails x10 are debrided without a bleeding  Reappoint at three-month intervals for nail debridement

## 2013-09-05 ENCOUNTER — Encounter: Payer: Self-pay | Admitting: Podiatry

## 2013-09-19 ENCOUNTER — Other Ambulatory Visit: Payer: Self-pay | Admitting: Internal Medicine

## 2013-09-20 ENCOUNTER — Telehealth: Payer: Self-pay | Admitting: *Deleted

## 2013-09-20 ENCOUNTER — Other Ambulatory Visit: Payer: Self-pay | Admitting: Dermatology

## 2013-09-20 NOTE — Telephone Encounter (Signed)
rx refill- xanax 0.5mg   Last OV- 06/23/13 Last refilled- #90 / 2 rf  UDS- 01/20/13 LOW risk

## 2013-09-21 MED ORDER — ALPRAZOLAM 0.5 MG PO TABS: ORAL_TABLET | ORAL | Status: DC

## 2013-09-21 NOTE — Telephone Encounter (Signed)
[facility] to [facility] 

## 2013-09-21 NOTE — Telephone Encounter (Signed)
done

## 2013-10-07 DIAGNOSIS — M869 Osteomyelitis, unspecified: Secondary | ICD-10-CM

## 2013-10-07 HISTORY — PX: TRANSMETATARSAL AMPUTATION: SHX6197

## 2013-10-07 HISTORY — DX: Osteomyelitis, unspecified: M86.9

## 2013-10-12 ENCOUNTER — Telehealth: Payer: Self-pay | Admitting: Internal Medicine

## 2013-10-12 NOTE — Telephone Encounter (Signed)
Caller name: Chrissie NoaWilliam Relation to pt: Call back [phone] [facility] [address]  Reason for call:  Pt is needing the Rx LYRICA 75 MG capsule refilled.  The pharmacy states they sent over 2 requests this week.  Pt is going out of town tomrorow.  Can we please send this and call pt when done.

## 2013-10-12 NOTE — Telephone Encounter (Signed)
See below

## 2013-10-12 NOTE — Telephone Encounter (Signed)
Okay 3 months supply and 1 RF

## 2013-10-12 NOTE — Telephone Encounter (Signed)
See below.  The patient's last refill was for #90 with a refill which was for once daily but he takes them bid so he is out. Can we refill?

## 2013-10-13 MED ORDER — PREGABALIN 75 MG PO CAPS: ORAL_CAPSULE | ORAL | Status: DC

## 2013-10-13 NOTE — Telephone Encounter (Signed)
Rx faxed. LM on voice mail for patient.

## 2013-10-23 ENCOUNTER — Other Ambulatory Visit: Payer: Self-pay | Admitting: Internal Medicine

## 2013-10-27 ENCOUNTER — Telehealth: Payer: Self-pay | Admitting: *Deleted

## 2013-10-27 ENCOUNTER — Ambulatory Visit (INDEPENDENT_AMBULATORY_CARE_PROVIDER_SITE_OTHER): Payer: Medicare Other

## 2013-10-27 ENCOUNTER — Ambulatory Visit (INDEPENDENT_AMBULATORY_CARE_PROVIDER_SITE_OTHER): Payer: Medicare Other | Admitting: Podiatry

## 2013-10-27 ENCOUNTER — Encounter: Payer: Self-pay | Admitting: Podiatry

## 2013-10-27 VITALS — BP 169/73 | HR 81 | Temp 97.7°F | Resp 16

## 2013-10-27 DIAGNOSIS — L97509 Non-pressure chronic ulcer of other part of unspecified foot with unspecified severity: Secondary | ICD-10-CM

## 2013-10-27 LAB — CBC WITH DIFFERENTIAL/PLATELET
Basophils Absolute: 0 10*3/uL (ref 0.0–0.1)
Basophils Relative: 0 % (ref 0–1)
EOS PCT: 1 % (ref 0–5)
Eosinophils Absolute: 0.1 10*3/uL (ref 0.0–0.7)
HEMATOCRIT: 37.8 % — AB (ref 39.0–52.0)
HEMOGLOBIN: 13.1 g/dL (ref 13.0–17.0)
LYMPHS PCT: 20 % (ref 12–46)
Lymphs Abs: 1.3 10*3/uL (ref 0.7–4.0)
MCH: 32 pg (ref 26.0–34.0)
MCHC: 34.7 g/dL (ref 30.0–36.0)
MCV: 92.4 fL (ref 78.0–100.0)
MONO ABS: 0.8 10*3/uL (ref 0.1–1.0)
MONOS PCT: 13 % — AB (ref 3–12)
Neutro Abs: 4.2 10*3/uL (ref 1.7–7.7)
Neutrophils Relative %: 66 % (ref 43–77)
Platelets: 253 10*3/uL (ref 150–400)
RBC: 4.09 MIL/uL — AB (ref 4.22–5.81)
RDW: 12.9 % (ref 11.5–15.5)
WBC: 6.4 10*3/uL (ref 4.0–10.5)

## 2013-10-27 LAB — C-REACTIVE PROTEIN: CRP: 4.8 mg/dL — ABNORMAL HIGH (ref <0.60)

## 2013-10-27 LAB — SEDIMENTATION RATE: Sed Rate: 23 mm/hr — ABNORMAL HIGH (ref 0–16)

## 2013-10-27 MED ORDER — CIPROFLOXACIN HCL 500 MG PO TABS: 500.0000 mg | ORAL_TABLET | Freq: Two times a day (BID) | ORAL | Status: DC

## 2013-10-27 MED ORDER — CLINDAMYCIN HCL 300 MG PO CAPS: 300.0000 mg | ORAL_CAPSULE | Freq: Three times a day (TID) | ORAL | Status: DC

## 2013-10-27 NOTE — Patient Instructions (Signed)
Monitor for any signs/symptoms of infection. Call the office immediately if any occur or go directly to the emergency room. Call with any questions/concerns.  

## 2013-10-27 NOTE — Telephone Encounter (Signed)
I was just there to see Dr. Ardelle AntonWagoner.  This may be a silly question but I have tickets to the [facility].  I wonder if I can go to the [facility] or should I stay off the foot?

## 2013-10-27 NOTE — Progress Notes (Addendum)
   Subjective:    Patient ID: Gene [name], male    DOB: 05/16/1940, 73 y.o.   MRN: [id number]  HPI Comments: "I have a bad toe"  Patient c/o red, swollen 3rd toe left for couple days. Initially,  5 days ago noticed his foot was swollen and then the toe itself started to get red and swollen. The tip is callused and ulcerated. He has been using antibiotic ointment, no bandage. Not sure if its draining.  Denies any systemic complaints such as fevers, chills, nausea, vomiting.  Review of Systems  All other systems reviewed and are negative.      Objective:   Physical Exam AAO x3, NAD DP pulses palpable b/l, slight decrease in PT. CRT < 3 sec except L 3rd The third digit with cellulitis of the digit extending to the level of the MPJ however doesn't extend. There is a wound at the distal aspect of the digit with surrounding macerated tissue with granular base and there is probing to bone. No drainage/purulence noted. Mild edema to the foot, however no cellulitis extending past the toe. Hammertoe contracture. No  areas of fluctuance or crepitus. Edema to digit.  Nails elongated, dystrophic, discolored, hypertrophic.  No leg pain/edema.  Protective sensation decreased       Assessment & Plan:  73 year old male the third digit cellulitis with a wound at the distal aspect of the digit which probes to bone. -X-Rays were obtained. At this time does note that the cortical destruction to suggest osteomyelitis or soft tissue emphysema. -Discussed conservative vs. surgical  intervention with the patient in detail including alternatives, risks, complications. -Discussed that as the wound does probe to bone and there is a high likelihood that he may lose part or all of the toe. However, at this time we will continue to monitor.  -Prescribed clindamycin and Cipro.  -Bloodwork for CBC, ESR, CRP. -ABIs PVRs  -Betadine dressing changes daily.  -Followup in 1 week or sooner if any problems are to  arise. -Continue to monitor for any worsening symptoms- if there any changes or worsening of symptoms, the patient is call the office immediately or go directly to the emergency room.

## 2013-11-01 ENCOUNTER — Ambulatory Visit: Payer: Medicare Other | Admitting: Podiatry

## 2013-11-01 ENCOUNTER — Encounter: Payer: Self-pay | Admitting: Podiatry

## 2013-11-01 VITALS — BP 111/63 | HR 71 | Temp 96.8°F | Resp 15

## 2013-11-01 DIAGNOSIS — L97509 Non-pressure chronic ulcer of other part of unspecified foot with unspecified severity: Secondary | ICD-10-CM

## 2013-11-01 NOTE — Telephone Encounter (Signed)
Patient had a follow-up appointment with Dr. Leeanne Deed today.

## 2013-11-01 NOTE — Patient Instructions (Signed)
Maintain previous antibiotics Clindamycin 300 mg 3 times a day Cipro 500 mg 2 times a day Soak foot daily and apply antibiotic ointment and skin ulcer third right toe Wear surgical shoe on right foot except when driving    Betadine Soak Instructions  Purchase an 8 oz. bottle of BETADINE solution (Povidone)  THE DAY AFTER THE PROCEDURE  Place 1 tablespoon of betadine solution in a quart of warm tap water.  Submerge your foot or feet with outer bandage intact for the initial soak; this will allow the bandage to become moist and wet for easy lift off.  Once you remove your bandage, continue to soak in the solution for 20 minutes.  This soak should be done twice a day.  Next, remove your foot or feet from solution, blot dry the affected area and cover.  You may use a band aid large enough to cover the area or use gauze and tape.  Apply other medications to the area as directed by the doctor such as cortisporin otic solution (ear drops) or neosporin.  IF YOUR SKIN BECOMES IRRITATED WHILE USING THESE INSTRUCTIONS, IT IS OKAY TO SWITCH TO EPSOM SALTS AND WATER OR WHITE VINEGAR AND WATER.

## 2013-11-01 NOTE — Progress Notes (Signed)
Patient ID: Gene [name], male   DOB: 04/24/1940, 73 y.o.   MRN: [id number]  Subjective: This patient presents for follow care for ulcerations cellulitis of the left foot initially evaluated on 10/27/2013 by Dr. Loreta Ave. At that time clindamycin 300 mg 3 times a day and Cipro 500 without grams twice a day was prescribed. Betadine soaks recommended Patient states that he is taking his antibiotics and denies any complaints from antibiotics  Objective: The third left toe is edematous and erythematous with an open skin ulcer with evidence of fat at the base of the wound. The erythema and edema extending to the left midfoot. There is no warmth or malodor noted No calf pain elicited left  Assessment: Distal ulceration third digit left foot Cellulitis extending from the distal third left toe the dorsal left midfoot  Plan:  debrided superficial margins of skin ulcer Continue Cipro 500 mg twice a day Continue clindamycin 300 mg 3 times a day Continue Betadine soaks and light gauze dressing A surgical shoe dispensed to wear on the left foot  If patient develops increase of pain, swelling, redness present to ERReappoint x7 days and repeat x-ray   Reappoint x7 days and repeat x-ray on the left foot

## 2013-11-06 ENCOUNTER — Ambulatory Visit (INDEPENDENT_AMBULATORY_CARE_PROVIDER_SITE_OTHER): Payer: Medicare Other | Admitting: Podiatry

## 2013-11-06 ENCOUNTER — Ambulatory Visit (INDEPENDENT_AMBULATORY_CARE_PROVIDER_SITE_OTHER): Payer: Medicare Other

## 2013-11-06 VITALS — BP 137/64 | HR 85 | Temp 97.7°F | Resp 16

## 2013-11-06 DIAGNOSIS — L97509 Non-pressure chronic ulcer of other part of unspecified foot with unspecified severity: Secondary | ICD-10-CM

## 2013-11-06 NOTE — Patient Instructions (Signed)
Maintain Cipro 500 mg twice a day Maintain clindamycin 300 mg 3 times a day Continued diluted Betadine soaks daily to the left foot Apply antibiotic ointment to the skin ulcer on the third left toe daily Wear surgical shoe on left foot when walking, remove when driving  Followup in 10 days with Dr. Loreta Ave

## 2013-11-06 NOTE — Progress Notes (Signed)
Patient ID: Gene [name], male   DOB: May 16, 1940, 73 y.o.   MRN: [id number]  Subjective: This patient presents for follow up care follow ulceration cellulitis third toe left foot under care since 10/27/2013. Currently patient taking clindamycin by mouth 300 mg 3 times a day and Cipro 500 by mouth twice a day and denies any complaints from these  medications. Dr. Loreta Ave was present during the examination today  Objective: Dorsal edema noted in the left foot Erythema noted in the third left toe Ulceration distal third left toe with fibrous tissue, without any active drainage or malodor  X-ray examination left foot  The distal phalanx of the third toe left foot demonstrates some decreased bone density without obvious cortical disruption  Radiographic impression:  Cannot rule out osteomyelitis of the distal phalanx third toe, left foot  Assessment: Ulceration third toe left foot Cellulitis third toe and foot, left Cannot rule out osteomyelitis third toe left foot  Plan: Continue Cipro 500 mg by mouth twice a day Continue clindamycin 300 mg by mouth 3 times a day Continue Betadine soaks the foot daily Continue wearing surgical shoe on left foot  Had a detailed discussion with patient today make him aware that osteomyelitis of the third toe left foot may be developing, however, cannot conclusively diagnose osteomyelitis at this time. Patient is made aware that amputation may be a possibility.  The patient's care is transferred to Dr. Loreta Ave and he will follow patient at next visit scheduled in 10 days or sooner if patient has a concern

## 2013-11-07 ENCOUNTER — Encounter: Payer: Self-pay | Admitting: Podiatry

## 2013-11-15 ENCOUNTER — Telehealth: Payer: Self-pay | Admitting: *Deleted

## 2013-11-15 ENCOUNTER — Telehealth (HOSPITAL_COMMUNITY): Payer: Self-pay | Admitting: *Deleted

## 2013-11-15 ENCOUNTER — Encounter: Payer: Self-pay | Admitting: Podiatry

## 2013-11-15 ENCOUNTER — Ambulatory Visit (INDEPENDENT_AMBULATORY_CARE_PROVIDER_SITE_OTHER): Payer: Medicare Other | Admitting: Podiatry

## 2013-11-15 VITALS — BP 122/53 | HR 74 | Temp 97.1°F | Resp 14 | Ht 69.0 in | Wt 205.0 lb

## 2013-11-15 DIAGNOSIS — M86179 Other acute osteomyelitis, unspecified ankle and foot: Secondary | ICD-10-CM

## 2013-11-15 DIAGNOSIS — L97509 Non-pressure chronic ulcer of other part of unspecified foot with unspecified severity: Secondary | ICD-10-CM

## 2013-11-15 NOTE — Progress Notes (Signed)
Patient ID: Gene [name], male   DOB: 1940-11-05, 73 y.o.   MRN: [id number]  Subjective: 73 year old male returns the office they for followup evaluation of left third digit osteomyelitis and cellulitis. Patient states that he didn't go for a second opinion for which she was also told that he needed to have the toe removed. Patient presents today without any systemic complaints such as fevers, chills, nausea, vomiting. Patient also does state though that he does see bone out of the distal aspect of the toe. He is continued on antibiotics. No other complaints.  Objective: AAO x3, NAD DP/PT pulses decreased. CRT < 3sec with the exception of the left third digit. Decreased protective sensation. Right third digit cellulitis however does not extend past the MTPJ. Exposed distal phalanx which is dark in color.. Macerated tissue along both the medial and lateral aspect of the digit. Edema to the digit. No fluctuance or crepitus. No ascending cellulitis.  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 At this time due to the progression of the wound and exposed bone, recommended toe amputation. Patient understands and has decided that he is ready to pursue amputation. Risks of the surgery were discussed the patient including but not limited to, worsening of infection, pain, swelling, need for further surgery/amputation, delayed or nonhealing, painful or ugly scar, numbness or sensation changes, blood clots, digital deformity of remaining digits. Patient understands the risks.  No promises or guarantees were given up the outcome of the procedure and all questions were answered. Surgical consent was given to the patient for which she signed.  -Continue course of antibiotics to  -Due to decreased pulses will obtain  noninvasive vascular testing prior to surgery.  -Discussed that if the infection were says, notices any pus, worsening redness he is to call the office immediately or go directly to the emergency room.  -Surgery to be scheduled for Monday, 11/20/2013 at [facility]. Patient was given preoperative instructions.  -Call with any questions or concerns or any change in symptoms.

## 2013-11-15 NOTE — Telephone Encounter (Signed)
Referral sent to [facility] per Dr. Al Corpus for a lower extremity doppler with ABIs.  [facility] [address] [place], [place] [address] Ph: [phone]

## 2013-11-15 NOTE — Telephone Encounter (Signed)
Referral for [facility] has been changed to [facility] at [facility] upon the patient's request.  He is already a patient there.  Disregard previous order.

## 2013-11-15 NOTE — Patient Instructions (Signed)
Pre-Operative Instructions  You can be assured that the doctors of [facility] will be with you every step of the way.  1. Plan to be at the surgery center/hospital at least 1 (one) hour prior to your scheduled time unless otherwise directed by the surgical center/hospital staff.  You must have a responsible adult accompany you, remain during the surgery and drive you home.  Make sure you have directions to the surgical center/hospital and know how to get there on time. 2. For hospital based surgery you will need to obtain a history and physical form from your family physician within 1 month prior to the date of surgery- we will give you a form for you primary physician.  3. We make every effort to accommodate the date you request for surgery.  There are however, times where surgery dates or times have to be moved.  We will contact you as soon as possible if a change in schedule is required.   4. No Aspirin/Ibuprofen for one week before surgery.  If you are on aspirin, any non-steroidal anti-inflammatory medications (Mobic, Aleve, Ibuprofen) you should stop taking it 7 days prior to your surgery.  You make take Tylenol  For pain prior to surgery.  5. Medications- If you are taking daily heart and blood pressure medications, seizure, reflux, allergy, asthma, anxiety, pain or diabetes medications, make sure the surgery center/hospital is aware before the day of surgery so they may notify you which medications to take or avoid the day of surgery. 6. No food or drink after midnight the night before surgery unless directed otherwise by surgical center/hospital staff. 7. No alcoholic beverages 24 hours prior to surgery.  No smoking 24 hours prior to or 24 hours after surgery. 8. Wear loose pants or shorts- loose enough to fit over bandages, boots, and casts. 9. No slip on shoes, sneakers are best. 10. Bring your boot with you to the surgery center/hospital.  Also bring crutches or a walker if your  physician has prescribed it for you.  If you do not have this equipment, it will be provided for you after surgery. 11. If you have not been contracted by the surgery center/hospital by the day before your surgery, call to confirm the date and time of your surgery. 12. Leave-time from work may vary depending on the type of surgery you have.  Appropriate arrangements should be made prior to surgery with your employer. 13. Prescriptions will be provided immediately following surgery by your doctor.  Have these filled as soon as possible after surgery and take the medication as directed. 14. Remove nail polish on the operative foot. 15. Wash the night before surgery.  The night before surgery wash the foot and leg well with the antibacterial soap provided and water paying special attention to beneath the toenails and in between the toes.  Rinse thoroughly with water and dry well with a towel.  Perform this wash unless told not to do so by your physician.  Enclosed: 1 Ice pack (please put in freezer the night before surgery)   1 Hibiclens skin cleaner   Pre-op Instructions  If you have any questions regarding the instructions, do not hesitate to call our office.  [place]: [address] [place], [place] [address] [phone]  [place]: [address] [place], [place] [address] [phone]  [place]: [address], [place] [address] [phone]  Dr. Santiago Bumpers DPM, Dr. Cristie Hem DPM Dr. Alvan Dame DPM, Dr. Ardelle Anton DPM, Dr.  Marlowe Aschoff DPM, Dr. Lesia Sago. Wagoner, DPM   Monitor for any signs/symptoms of infection. Call the office immediately if any occur or go directly to the emergency room. Call with any questions/concerns. Follow up with [facility] for vascular studies. If you have not heard from the office by the end of today, please give [place] a call. Call with any questions or concerns.

## 2013-11-16 ENCOUNTER — Telehealth (HOSPITAL_COMMUNITY): Payer: Self-pay | Admitting: *Deleted

## 2013-11-17 ENCOUNTER — Encounter: Payer: Self-pay | Admitting: Podiatry

## 2013-11-17 ENCOUNTER — Ambulatory Visit (HOSPITAL_COMMUNITY)
Admission: RE | Admit: 2013-11-17 | Discharge: 2013-11-17 | Disposition: A | Discharge status: Home / Self Care | Payer: Medicare Other | Source: Ambulatory Visit | Attending: Cardiology | Admitting: Cardiology

## 2013-11-17 DIAGNOSIS — L97509 Non-pressure chronic ulcer of other part of unspecified foot with unspecified severity: Secondary | ICD-10-CM | POA: Diagnosis present

## 2013-11-17 NOTE — Progress Notes (Signed)
Lower Extremity Arterial Duplex Completed. °Brianna L Mazza,RVT °

## 2013-11-20 ENCOUNTER — Other Ambulatory Visit: Payer: Self-pay | Admitting: Podiatry

## 2013-11-20 ENCOUNTER — Encounter: Payer: Self-pay | Admitting: Podiatry

## 2013-11-20 DIAGNOSIS — M86679 Other chronic osteomyelitis, unspecified ankle and foot: Secondary | ICD-10-CM

## 2013-11-20 MED ORDER — PROMETHAZINE HCL 12.5 MG PO TABS: 12.5000 mg | ORAL_TABLET | Freq: Three times a day (TID) | ORAL | Status: DC | PRN

## 2013-11-24 ENCOUNTER — Ambulatory Visit (INDEPENDENT_AMBULATORY_CARE_PROVIDER_SITE_OTHER): Payer: Medicare Other

## 2013-11-24 ENCOUNTER — Ambulatory Visit (INDEPENDENT_AMBULATORY_CARE_PROVIDER_SITE_OTHER): Payer: Medicare Other | Admitting: Podiatry

## 2013-11-24 ENCOUNTER — Encounter: Payer: Self-pay | Admitting: Podiatry

## 2013-11-24 VITALS — BP 107/43 | HR 83 | Resp 18

## 2013-11-24 DIAGNOSIS — Z09 Encounter for follow-up examination after completed treatment for conditions other than malignant neoplasm: Secondary | ICD-10-CM

## 2013-11-24 NOTE — Progress Notes (Signed)
   Subjective:    Patient ID: Gene [name], male    DOB: 1941-01-14, 73 y.o.   MRN: [id number]  HPI  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 States that he has had mild discomfort and only intermittently has had a take Vicodin. He has continued wearing a cam boot at all times. Denies any systemic complaints such as fevers, chills, nausea, vomiting. Has continued his antibiotics. No new complaints at this time. No acute changes since surgery.  On the day of surgery patient presented in the preoperative holding area with new lesions on lateral hallux, medial second digit, lateral fourth digit. Also after surgery patient called and we discussed the results of his vascular studies.   Review of Systems     Objective:   Physical Exam AAO x3, NAD DP/PT pulses palpable b/l. CRT < 3sec Neurological status unchanged. Incision status post left third partial ray amputation well coapted without any evidence of dehiscence. Sutures intact. No surrounding erythema, drainage, ascending cellulitis. No tenderness over surgical site. Mild edema over surgical site. Pre-ulcerative lesion lateral hallux almost healed. There is a pre-ulcerative lesion on the medial second toe at the PIPJ. Overlying skin intact but slightly macerated and appearance. Superficial ulceration lateral fourth digit along the distal aspect. It does not probe, undermining, tunnel. No surrounding erythema or drainage. Hyperkeratotic periwound.      Assessment & Plan:  73 year old male 5 days status post left partial third ray amputation secondary to infection; pre-ulcerative lesions lateral hallux, medial second digit; superficial ulceration lateral fourth digit -X-rays were obtained and reviewed with the patient. -Conservative versus surgical treatment were discussed including alternatives, risks, complications. -Surgical site is healing appropriately without  any clinical signs of infection. Continue the antibiotics for 1 more week then will likely discontinue -Hyperkeratotic lesion lateral fourth digit sharply debrided to healthy wound base. Continued antibiotic ointment over the site and a dressing. -Betadine placed over the incision and antibiotic ointment over the pre-ulcerative in ulcerative lesions followed by a dry sterile dressing. Keep dressing clean, dry, intact until followup appointment. -Followup in 1 week or sooner if any problems are to arise or any change in symptoms. Call with any questions, concerns, change in symptoms.

## 2013-11-24 NOTE — Patient Instructions (Signed)
Keep dressing clean, dry, intact. Continue antibiotics. Monitor for any signs/symptoms of infection. Call the office immediately if any occur or go directly to the emergency room. Call with any questions/concerns.

## 2013-11-26 ENCOUNTER — Encounter: Payer: Self-pay | Admitting: Podiatry

## 2013-12-01 ENCOUNTER — Encounter: Payer: Self-pay | Admitting: Podiatry

## 2013-12-01 ENCOUNTER — Encounter: Payer: Medicare Other | Admitting: Podiatry

## 2013-12-01 ENCOUNTER — Ambulatory Visit (INDEPENDENT_AMBULATORY_CARE_PROVIDER_SITE_OTHER): Payer: Medicare Other | Admitting: Podiatry

## 2013-12-01 VITALS — BP 127/54 | HR 63 | Resp 18

## 2013-12-01 DIAGNOSIS — L97509 Non-pressure chronic ulcer of other part of unspecified foot with unspecified severity: Secondary | ICD-10-CM

## 2013-12-01 DIAGNOSIS — Z09 Encounter for follow-up examination after completed treatment for conditions other than malignant neoplasm: Secondary | ICD-10-CM

## 2013-12-01 NOTE — Patient Instructions (Signed)
Monitor for any signs/symptoms of infection. Call the office immediately if any occur or go directly to the emergency room. Call with any questions/concerns.  

## 2013-12-04 ENCOUNTER — Encounter: Payer: Self-pay | Admitting: Podiatry

## 2013-12-04 NOTE — Progress Notes (Signed)
Patient ID: Gene [name], male   DOB: 1940-08-19, 73 y.o.   MRN: [id number]  Subjective: Mr. Kronberg returns the office today for followup evaluation status post left partial third ray amputation. He denies any systemic complaints such as fevers, chills, nausea, vomiting. Pain is currently controlled. His been continuing with the CAM boot. Denies any acute changes since last appointment and denies any changes. Patient was inquiring about when he can start driving again. No other complaints at this time.  Objective: AAO x3, NAD Neurovascular status unchanged. Left foot status post partial third ray amputation. Sutures intact without any evidence of dehiscence as well coapted. There is decreased edema. No surrounding erythema or drainage. No ascending cellulitis. No increased warmth around the surgical site. No tenderness around surgical site. Hyperkeratotic lesion medial second digit over PIPJ. Upon debridement there is superficial granular ulceration underlying. There is no surrounding erythema, drainage, purulence, pain, malodor. No tunneling, undermining, probing. Hyperkeratotic lesion lateral fourth digit without underlying ulceration.  Pre-ulcerative lesion lateral hallux. No calf pain with compression, swelling, warmth.  Assessment: 73 year old male 11 days status post partial third ray amputation. With continued pre-ulcerative lesion lateral hallux, ulcerative lesion medial second toe, hyperkeratotic lesion lateral fourth toe. Currently no signs of infection.  Plan: -Various treatment options were discussed including alternatives, risks, complications. -Surgical site as the incisions well coapted appears to be healed at this time dorsal sutures were removed cover the plantar sutures remain intact. Upon removal the dorsal sutures the skin appear to be well coapted intact. We'll remove the plantar sutures next appointment. Continue to keep surgical site clean, dry, dressing intact. -Continue  local wound care over the second digit and close evaluation over the remaining pre-ulcerative lesions. -Patient can switch to a surgical shoe at this time. Patient was asking if he can start driving (states the he has was driving previously with the surgical shoe). I informed the patient he is not to drive with a surgical shoe and that he should hold off on driving until the incision is completely healed. -Can discontinue antibiotics at this time. - hyperkeratotic lesion sharply debrided without complications. -Followup in 1 week or sooner if any problems are to arise or any change in symptoms. In the meantime call any questions, concerns.

## 2013-12-05 NOTE — Progress Notes (Unsigned)
Dr Jacqualyn Posey performed an amputation of 3rd left met on 11/20/13

## 2013-12-06 ENCOUNTER — Telehealth: Payer: Self-pay

## 2013-12-06 NOTE — Telephone Encounter (Signed)
LVM for pt to call back and schedule AWV.  

## 2013-12-08 ENCOUNTER — Encounter: Payer: Self-pay | Admitting: Podiatry

## 2013-12-08 ENCOUNTER — Ambulatory Visit (INDEPENDENT_AMBULATORY_CARE_PROVIDER_SITE_OTHER): Payer: Medicare Other | Admitting: Podiatry

## 2013-12-08 VITALS — BP 133/70 | HR 66 | Temp 96.7°F | Resp 14

## 2013-12-08 DIAGNOSIS — L97521 Non-pressure chronic ulcer of other part of left foot limited to breakdown of skin: Secondary | ICD-10-CM

## 2013-12-08 DIAGNOSIS — B351 Tinea unguium: Secondary | ICD-10-CM

## 2013-12-08 DIAGNOSIS — M79676 Pain in unspecified toe(s): Secondary | ICD-10-CM

## 2013-12-08 DIAGNOSIS — Z09 Encounter for follow-up examination after completed treatment for conditions other than malignant neoplasm: Secondary | ICD-10-CM

## 2013-12-08 NOTE — Progress Notes (Signed)
Patient ID: Gene [name], male   DOB: 04-01-1940, 73 y.o.   MRN: [id number]  Subjective: Gene [name] returns to the office today for follow up evaluation status post left partial third ray amputation. Patient denies any systemic complaints such as fevers, chills, nausea, vomiting. He does state that he has noticed some drainage on his sock over the last 2 days. Patient does state that he has not been driving since surgery although he has been walking more. He does state that he would like to start driving in a couple of days.  Patient also uses for followup evaluation of ulcerative lesions of the second and fourth toe. Denies any acute changes and possible.  Also patient is inquiring about nail debridement as his nails are painful elongated. Pain is particularly with shoe gear.  No other complaints at this time. No acute changes since last appointment.   Objective: AAO x3, NAD Neurovascular status unchanged. Left foot status post partial third ray amputation. Incision appears to be well coapted without any evidence of dehiscence. There is maceration around surgical site. Sutures intact to the inferior aspect of the incision. No surrounding erythema, ascending cellulitis, fluctuance, crepitus. No tenderness over surgical site. Minimal edema. Superficial ulceration on the medial aspect of the second digit, lateral aspect of hallux, lateral fourth digit. Lateral fourth digit overlying hyperkeratotic tissue. Lateral hallux, second medial with superficial fibro-granular wound with macerated periwound. No surrounding erythema, drainage, a cement cellulitis, fluctuance, crepitus around the sites. Nails hypertrophic, dystrophic, elongated x9. No surrounding erythema or drainage from around the nail sites. No calf pain, swelling, warmth.  Assessment: 73 year old male status post left partial third ray amputation with ulceration on the left hallux, second, fourth digit which is been present since the day of  surgery; symptomatic onychomycosis.  Plan: -Conservative versus surgical treatment were discussed including alternatives, risks, complications. -Left fourth digit wound sharply debrided of hyperkeratotic tissue to healthy granular wound base. Hallux and second digit wounds lightly debrided. Antibiotic ointment applied over the fourth digit wound and Betadine was painted into the first interspace over the wounds as they are macerated. -Left partial third ray amputation site macerated. Betadine applied over the incision site. Remaining sutures removed without difficulty. Incision is well coapted without any evidence of dehiscence or infection. -Continue with surgical shoe until next appointment. Patient states that he wents to go back into his shoe and start driving. I strongly recommended against this. Patient also states that he wants to drive with the surgical shoe and again I reiterated to him that he cannot do this. -Nail sharply debrided x9 without complications to patient comfort. -Followup in 2 weeks or sooner any problems are to arise or any changes symptoms. In the meantime call any questions, concerns.

## 2013-12-13 ENCOUNTER — Ambulatory Visit: Payer: Medicare Other | Admitting: Podiatry

## 2013-12-15 ENCOUNTER — Ambulatory Visit (INDEPENDENT_AMBULATORY_CARE_PROVIDER_SITE_OTHER): Payer: Medicare Other | Admitting: Podiatry

## 2013-12-15 ENCOUNTER — Ambulatory Visit (INDEPENDENT_AMBULATORY_CARE_PROVIDER_SITE_OTHER): Payer: Medicare Other

## 2013-12-15 ENCOUNTER — Encounter: Payer: Self-pay | Admitting: Podiatry

## 2013-12-15 VITALS — HR 64 | Resp 15

## 2013-12-15 DIAGNOSIS — L97521 Non-pressure chronic ulcer of other part of left foot limited to breakdown of skin: Secondary | ICD-10-CM

## 2013-12-15 DIAGNOSIS — L97324 Non-pressure chronic ulcer of left ankle with necrosis of bone: Secondary | ICD-10-CM

## 2013-12-15 DIAGNOSIS — Z09 Encounter for follow-up examination after completed treatment for conditions other than malignant neoplasm: Secondary | ICD-10-CM

## 2013-12-15 MED ORDER — CEPHALEXIN 500 MG PO CAPS: 500.0000 mg | ORAL_CAPSULE | Freq: Three times a day (TID) | ORAL | Status: DC

## 2013-12-17 NOTE — Progress Notes (Signed)
Patient ID: Gene [name], male   DOB: [birth year], 73 y.o.   MRN: [id number]  Subjective: [name], 73 year old male, returns the office they for followup evaluation status post left partial third ray amputation. He denies any systemic complaints as fevers, chills, nausea, vomiting. Since last week he has noticed a progression of the wounds on his toes. He states that he applies Betadine interdigitally as well as some antibiotic ointment over the fourth digit ulceration. He then wraps the foot in kling and then only putting the silicone spacers between his toes without any gauze or protection. He continues to drive into a surgical shoe despite my recommendations not to. He drives with a clutch and therefore uses his left foot to drive. No other complaints at this time.  Objective: AAO x3, NAD Neurovascular status unchanged. Left foot status post partial third ray amputation. There is significant maceration overlying the surgical site. The incision is healed although there is a new superficial granular ulceration in the interspace overlying the surgical site. This is likely result of maceration. There is approximately 0.8 5.8 cm annular ulceration off of the medial aspect of the second digit with macerated periwound. There is a corresponding smaller ulceration on the lateral aspect of the hallux. These 2 wounds appear to be from pressure. There is no undermining, tunneling, probing. No overlying erythema, drainage, a sitting cellulitis. The wounds appear to be superficial. There is an ulceration on the lateral aspect of the fourth distal digit which appears to be hyperkeratotic surrounding. There is mild erythema to the fourth digit. No ascending cellulitis, fluctuance, crepitus. There is no probe to bone, tunneling, undermining. No calf pain, swelling, warmth.  Assessment: 73 year old male status post left partial third ray amputation with maceration over surgical site with a new superficial granular  ulceration without clinical signs of infection. There is continued wound over the lateral hallux, medial second digit from pressure as well as wound over the lateral fourth toe with some new mild erythema  Plan: -Various treatment options discussed including alternatives, risks, complications. -I again discussed again with the patient today the proper way to dress his foot. I recommend using Betadine interdigitally over the macerated areas followed by gauze in between the toes and between toes. He can use the spacers between the toes but he must use the gauze over the digit to help prevent further irritation. I am concerned about these wounds as they are progressing. There is new erythema over the fourth digit. Will start Keflex. Patient is at high likelihood of further amputation for which I discussed with him today. I again strongly recommend the patient not to drive and to wear his surgical shoe. Monitor for any signs or symptoms of worsening infection and wound progression and directed to call the office immediately if any are to occur or go directly to the emergency room.  -If the wounds are not improving at next appointment will likely referred to the wound care center for second opinion. -Patient has my personal cell phone number and directed to call me if there are any questions or concerns.

## 2013-12-18 ENCOUNTER — Encounter: Payer: Self-pay | Admitting: Internal Medicine

## 2013-12-18 ENCOUNTER — Other Ambulatory Visit: Payer: Self-pay | Admitting: Internal Medicine

## 2013-12-19 ENCOUNTER — Other Ambulatory Visit: Payer: Self-pay | Admitting: Internal Medicine

## 2013-12-19 MED ORDER — GABAPENTIN 300 MG PO CAPS: ORAL_CAPSULE | ORAL | Status: DC

## 2013-12-22 ENCOUNTER — Encounter: Payer: Self-pay | Admitting: Podiatry

## 2013-12-22 ENCOUNTER — Ambulatory Visit (INDEPENDENT_AMBULATORY_CARE_PROVIDER_SITE_OTHER): Payer: Medicare Other

## 2013-12-22 ENCOUNTER — Ambulatory Visit (INDEPENDENT_AMBULATORY_CARE_PROVIDER_SITE_OTHER): Payer: Medicare Other | Admitting: Podiatry

## 2013-12-22 ENCOUNTER — Encounter: Payer: Self-pay | Admitting: Internal Medicine

## 2013-12-22 VITALS — BP 144/65 | HR 73 | Temp 96.8°F | Resp 16

## 2013-12-22 DIAGNOSIS — L03116 Cellulitis of left lower limb: Secondary | ICD-10-CM

## 2013-12-22 DIAGNOSIS — L97521 Non-pressure chronic ulcer of other part of left foot limited to breakdown of skin: Secondary | ICD-10-CM

## 2013-12-22 DIAGNOSIS — L97524 Non-pressure chronic ulcer of other part of left foot with necrosis of bone: Secondary | ICD-10-CM

## 2013-12-22 MED ORDER — CIPROFLOXACIN HCL 500 MG PO TABS: 500.0000 mg | ORAL_TABLET | Freq: Two times a day (BID) | ORAL | Status: DC

## 2013-12-22 MED ORDER — CLINDAMYCIN HCL 300 MG PO CAPS: 300.0000 mg | ORAL_CAPSULE | Freq: Three times a day (TID) | ORAL | Status: DC

## 2013-12-22 NOTE — Patient Instructions (Signed)
Pre-Operative Instructions  Congratulations, you have decided to take an important step to improving your quality of life.  You can be assured that the doctors of [facility] will be with you every step of the way.  1. Plan to be at the surgery center/hospital at least 1 (one) hour prior to your scheduled time unless otherwise directed by the surgical center/hospital staff.  You must have a responsible adult accompany you, remain during the surgery and drive you home.  Make sure you have directions to the surgical center/hospital and know how to get there on time. 2. For hospital based surgery you will need to obtain a history and physical form from your family physician within 1 month prior to the date of surgery- we will give you a form for you primary physician.  3. We make every effort to accommodate the date you request for surgery.  There are however, times where surgery dates or times have to be moved.  We will contact you as soon as possible if a change in schedule is required.   4. No Aspirin/Ibuprofen for one week before surgery.  If you are on aspirin, any non-steroidal anti-inflammatory medications (Mobic, Aleve, Ibuprofen) you should stop taking it 7 days prior to your surgery.  You make take Tylenol  For pain prior to surgery.  5. Medications- If you are taking daily heart and blood pressure medications, seizure, reflux, allergy, asthma, anxiety, pain or diabetes medications, make sure the surgery center/hospital is aware before the day of surgery so they may notify you which medications to take or avoid the day of surgery. 6. No food or drink after midnight the night before surgery unless directed otherwise by surgical center/hospital staff. 7. No alcoholic beverages 24 hours prior to surgery.  No smoking 24 hours prior to or 24 hours after surgery. 8. Wear loose pants or shorts- loose enough to fit over bandages, boots, and casts. 9. No slip on shoes, sneakers are best. 10. Bring  your boot with you to the surgery center/hospital.  Also bring crutches or a walker if your physician has prescribed it for you.  If you do not have this equipment, it will be provided for you after surgery. 11. If you have not been contracted by the surgery center/hospital by the day before your surgery, call to confirm the date and time of your surgery. 12. Leave-time from work may vary depending on the type of surgery you have.  Appropriate arrangements should be made prior to surgery with your employer. 13. Prescriptions will be provided immediately following surgery by your doctor.  Have these filled as soon as possible after surgery and take the medication as directed. 14. Remove nail polish on the operative foot. 15. Wash the night before surgery.  The night before surgery wash the foot and leg well with the antibacterial soap provided and water paying special attention to beneath the toenails and in between the toes.  Rinse thoroughly with water and dry well with a towel.  Perform this wash unless told not to do so by your physician.  Enclosed: 1 Ice pack (please put in freezer the night before surgery)   1 Hibiclens skin cleaner   Pre-op Instructions  If you have any questions regarding the instructions, do not hesitate to call our office.  [place]: [address] [place], [place] [address] [phone]  [place]: [address] , [place] [address] [phone]  [place]: [address]  , [place] [address] [phone]  Dr. Richard   Tuchman DPM, Dr. Norman Regal DPM Dr. Richard Sikora DPM, Dr. M. Todd Hyatt DPM, Dr. Kathryn Egerton DPM 

## 2013-12-24 ENCOUNTER — Telehealth: Payer: Self-pay | Admitting: Podiatry

## 2013-12-24 ENCOUNTER — Encounter: Payer: Self-pay | Admitting: Internal Medicine

## 2013-12-24 ENCOUNTER — Encounter: Payer: Self-pay | Admitting: Podiatry

## 2013-12-24 NOTE — Progress Notes (Addendum)
Patient ID: Gene FretWilliam [name], male   DOB: 01/01/1941, 73 y.o.   MRN: [id number]  Subjective: [name], 73 year old male returns the office they for followup evaluation status post left partial third ray amputation as well as ulcerations on the hallux, second, fourth digits. He states that he's been continuing with Betadine over the areas of maceration. He does state that there is something hard between his first and second toes for which he is unsure what it is. States he has been wearing his surgical shoe. Upon asking how often he does state that he goes without it at time. When walking without it, he tries to put the majority of pressure on his heel. He denies any systemic complaints such as fevers, chills, nausea, vomiting. No acute changes his last appointment. No other complaints at this time.  Objective: AAO x3, NAD DP/PT pulses decreased, CRT less than 3 seconds Protective sensation decreased with Simms Weinstein monofilament Left third partial ray amputation site healed with slight maceration over the area, decreased from last appointment. Wound on the medial aspect of the second digit with the proximal phalanx sticking out of the wound. The proximal phalanx of the second digit is pressing into the lateral hallux. Erythema over the second digit extending proximally just proximal to the MTPJ.  There is a fibro-granular wound on the corresponding aspect of the lateral hallux from pressure. Periwound is slightly macerated.Wound measures approximately 0.8 x 0.8. There is no erythema to the hallux. Left fourth digit on the lateral aspect with small eschar on distal lateral aspect. There is epidermalysis on the lateral aspect of the fourth digit. No fluctuance, crepitus. No purulence. The bone is not exposed, but close.  Bulla on the distal and medial aspect of the fifth digit. There is no erythema or drainage to the fifth digit. There is no areas of fluctuance, crepitus. No streaking. No malodor. No  purulence identified. No calf pain, swelling, warmth.  Assessment: 73 year old male status post left partial third ray amputation, healed, with decreased maceration to the site.. Second digit medial PIPJ wound with exposed proximal phalanx. Lateral hallux and fourth digit wound. New bulla on the fifth digit. Left foot cellulitis.   Plan: -X-rays were obtained and reviewed the patient. There is dislocation of the second digit. There is questionable areas of change in the fourth digit concerning for osteomyelitis. -Conservative versus surgical treatment discussed including alternatives, risks, complications. At this time as the second digit has exposed bone with cellulitis recommend amputation of the second digit. At this time the fourth digit is worsening. -I recommend the patient go to the emergency room for evaluation and for likely admission. He states that he does not want to go to the emergency room over the weekend and he wishes to have outpatient surgery on Monday. I discussed with him that if the cellulitis worsens or if he has any systemic symptoms he is to go directly to the emergency room over the weekend. I prescribed clindamycin and ciprofloxacin. -At this time recommended a partial second ray amputation. Also discussed possible partial fourth ray amputation as the wound appears to be worsening/eschar. At this time we'll proceed the second partial ray amputation and will evaluate possible fourth ray amputation day of surgery if needed. Alternatives, risks, complications of surgery discussed the patient in detail. Risks of the surgery which include but not limited to further infection, bleeding, pain, swelling, need for further surgery, need for further amputation or proximal amputation, delayed or nonhealing, painful or ugly scar, numbness  or sensation changes, transfer lesions/ulcerations. Patient understands the risks of surgery wishes to proceed with palpation surgery. -Will also discuss  with Dr. Gery PrayBarry for possible stenting. Dopplers were normal except for a short segment of occlusion within the AT/PT. The patient was able to heal his incision from the partial third ray amputation however he has been unable to heal the wounds on his digits. The wounds appeared to have started due to pressure as they started when the third digit was infected and swollen. It was causing compression onto the adjacent digits over the prominent areas. Since then the wounds have fluctuated in size.  -Concerned about a new bulla on the fifth digit as well as the wound on lateral hallux. Discussed with the patient that after removal of the second and fourth digits he will need a transmetatarsal amputation if any further digits need to be amputated. -Patient also states that he is concerned about being diabetic. I recommend him followup at his primary care physician for evaluation. [facility]  -Patient also continues to drive with surgical shoe and wounds. I again advised the patient that he should not be driving specifically with a surgical shoe and given the wounds. States that he has to pick up his granddaughter. Discussed with him to continue to wear surgical shoe or CAM boot.

## 2013-12-24 NOTE — Telephone Encounter (Signed)
Received email messages from the patient in regards to the surgery on Monday. On initial e-mail the patient states that he wanted to hold off on surgery and had a second opinion by Dr. Ralene CorkSikora. Also states that he did not want his fourth toe amputated on Monday. Patient sent a second e-mail to disregard the previous e-mail as she was in panic/shock in regards to the findings at the appointment on Friday.   I called the patient to follow with him given the messages and having surgery scheduled for tomorrow. Patient states just please disregard the e-mail as he was in shock after the appointment on Friday. I discussed with him again at great length surgical plan for tomorrow. Discussed that as the second digit is infected and the bone is exposed and will need a second partial ray amputation. I discussed with him that if he did not want of fourth toe amputated tomorrow we can await to evaluate his healing. He then responded by saying "it is OK, take as many toes as you want". Discussed with him that I will only do what is necessary. I also discussed with him that if he wanted to have a second opinion by Dr. Ralene CorkSikora that he would be in the office on Tuesday. He then stated that he did not want a second opinion and did not want to see any other doctor for this. He was concerned because he has a friend who has diabetes who is never lost the toe however he is not diabetic and is losing toes. He also states that he has a blister on his fifth toe which he just noticed yesterday. I discussed with him that there was a blister on his fifth toe on Friday at the appointment for which we have discussed.   Patient again states that he is confused when he keeps having toe wounds. I discussed with him that these are the same wounds that were present when he presented to the day of surgery for his partial third ray amputation. The wounds correspond to high-pressure areas. These likely started as a result of the third digit swelling  which caused the other digits to compress sites other causing the wounds. Over the last 3 weeks the wounds have fluctuated in size it appears that the wounds were direct result of pressure. Patient also states that he "drinks a lot of beer which probably doesn't help". We'll have the patient evaluated by Dr. Allyson SabalBerry and we'll consider wound care referral.   Patient wishes to proceed with surgery tomorrow morning.

## 2013-12-25 ENCOUNTER — Encounter: Payer: Self-pay | Admitting: Podiatry

## 2013-12-25 ENCOUNTER — Telehealth: Payer: Self-pay | Admitting: *Deleted

## 2013-12-25 ENCOUNTER — Telehealth: Payer: Self-pay | Admitting: Cardiovascular Disease

## 2013-12-25 ENCOUNTER — Telehealth: Payer: Self-pay | Admitting: Podiatry

## 2013-12-25 ENCOUNTER — Telehealth: Payer: Self-pay | Admitting: Internal Medicine

## 2013-12-25 DIAGNOSIS — M86172 Other acute osteomyelitis, left ankle and foot: Secondary | ICD-10-CM

## 2013-12-25 DIAGNOSIS — R0989 Other specified symptoms and signs involving the circulatory and respiratory systems: Secondary | ICD-10-CM

## 2013-12-25 DIAGNOSIS — L97521 Non-pressure chronic ulcer of other part of left foot limited to breakdown of skin: Secondary | ICD-10-CM

## 2013-12-25 NOTE — Telephone Encounter (Signed)
Spoke with Pt, informed him of Dr. [name] recommendations and Pt has appt scheduled for 10/20 at 1.

## 2013-12-25 NOTE — Telephone Encounter (Signed)
I'm a patient of Dr. Ardelle AntonWagoner.  I just left his office and he was [facility] [address].  I got there and they don't have a record of him calling it in or anything.  He wants me starting the antibiotic, ASAP.  If you would, have the nurse or someone call and let me know when I can pick up the medication.  Prescription was confirmed by Pharmacy on 12/22/13 at 4:41pm.

## 2013-12-25 NOTE — Telephone Encounter (Signed)
Patient has a history of peripheral neuropathy, last seen by neurology last year, felt to be due to  EtOH.   He does have peripheral vascular disease, last ABIs 11/17/2013 Spoke with Dr. Ardelle AntonWagoner. The patient is s/p  left third toe amputation that healed well. Subsequently had his second toe amputated today. The patient has a number of questions about what is going on.  Plan, call the patient called 1. I recommend a referal to orthopedic surgery--- please arrange dx  foot infection 2. I recommend to see me tomorrow afternoon, please arrange office visit

## 2013-12-25 NOTE — Progress Notes (Signed)
Patient ID: [name], male   DOB: 09-24-1940, 73 y.o.   MRN: [id number]  I discussed case with Dr. Allyson SabalBerry. He will arrange an appointment to see the patient.

## 2013-12-25 NOTE — Telephone Encounter (Signed)
Patient called me directly on my personal cell phone concerned that he now has 3 doctors appointments this week. He has a follow up with me on Wednesday as well as an appointment with Dr. Allyson SabalBerry on Friday and his PCP Tuesday. He was asking me why his PCP wants to see him tomorrow. I discussed with the patient that he has asked to be evaluated and wants a work-up. He has also expressed concern via e-mail to his PCP. This appointment was set up by his PCP. He asked me if he "really needed to go to this appointment". I recommended that he go to this appointment as he has not seen his PCP since April and has a lot of changes over the last few months. He was asking if he needed to fast for this appointment if they were doing blood work. I told him I was not sure what they would be doing and I was unsure if he needed to. He needs to discuss this with that office.  I also discussed they will be referring him to orthopedic surgery for a second opinion. He states  "I really don't want a second opinion". I recommend the second opinion to the patient as well. He is concerned about all these appointments.

## 2013-12-25 NOTE — Progress Notes (Signed)
Patient ID: Gene FretWilliam [name], male   DOB: 1940/06/01, 73 y.o.   MRN: [id number]  Discussed with the patient again today before surgery the operative plan for today. I also discussed again at great length with him the etiology of the ulcers.  I again offered for the patient to have a second opinion before the surgery, for which he declined. He also stated that when he was sending the messages to myself, and his primary care physician Friday night, he had been drinking. Gene [name] was inquiring about if the amount of drinking he does can interfere with the antibiotics/healing.   The patient also was asking about why his second toe became dislocated in the course of the week. He states that to help take the pressure off of the big toe, he was taping the end of the second toe around the ankle. He states that the day after he did this taping, he noticed the "hard" part develop on the side of the second toe- this is the exposed bone on the medial aspect of the 2nd digit.   On preoperative examination, the wound on the lateral hallux has improved somewhat. There is no surrounding erythema, ascending cellulitis, fluctuance, or crepitance. The wound base is fibro-granular. There is no probe to bone, tunneling, undermining.   The second digit has expose bone from the medial aspect of the digit with the distal aspect of the toe dislocated laterally. There is decreased erythema compared to Friday. No purulence noted.   Incision from prior partial 3rd ray amputation healed. Slight macerated tissue over the site, but improved.   Lateral aspect of the 4th digit with graunlar wound and small area of fibrotic tissue along the distal lateral aspect. The eschar over the end of the digit is no longer present. There is no erythema, ascending cellulitis, fluctuance, crepitance.   On the medial aspect of the 5th digit there is a granular wound with evidence of epidermolysis. No surrounding erythema, cellulitis, fluctuance,  crepitance. This is over the site of the blister from Friday.   For today, will proceed with partial 2nd ray amputation. Discussed possible 4th ray amputation. At this time the wound appears better than it did on Friday. There is no exposed bone, and no cellulitis over the area. Will hold off on 4th ray amputation at this time. Discussed however that the toe and the remaining toes are at a high possibility for infection and may need amputation in the future. Patient understands this and wishes to only proceed with the 2nd partial ray amputation.   I offered a second opinion for which the patient declined. I also recommended the patient be seen by wound care, patient declined. Will contact Dr. Allyson SabalBerry. The patient did heal the surgical incision, and the wounds appear to heel once the pressure is removed however will get a second opinion from Dr. Allyson SabalBerry. Also recommended the patient follow up with his PCP. I have discussed this with him at most appointments that he needs to be seen by his PCP, however he has not done so. I have also discussed this with his son, Gene [name], after surgery (with permission from the patient).

## 2013-12-25 NOTE — Telephone Encounter (Signed)
Per Dr. Ardelle AntonWagoner, I put in a request for a STAT appointment with Dr. Nanetta BattyJonathan Berry for a consultation.

## 2013-12-25 NOTE — Telephone Encounter (Signed)
LMOM for Pt to return call about possibly making appt tmr with Dr. [name].

## 2013-12-25 NOTE — Telephone Encounter (Signed)
error 

## 2013-12-25 NOTE — Telephone Encounter (Signed)
See previous phone note.  

## 2013-12-25 NOTE — Telephone Encounter (Signed)
Pt returned your call please call mobile [phone]

## 2013-12-26 ENCOUNTER — Encounter: Payer: Self-pay | Admitting: Internal Medicine

## 2013-12-26 ENCOUNTER — Ambulatory Visit (INDEPENDENT_AMBULATORY_CARE_PROVIDER_SITE_OTHER): Payer: 59 | Admitting: Internal Medicine

## 2013-12-26 VITALS — BP 145/76 | HR 82 | Temp 97.6°F | Wt 200.1 lb

## 2013-12-26 DIAGNOSIS — G629 Polyneuropathy, unspecified: Secondary | ICD-10-CM

## 2013-12-26 DIAGNOSIS — F1010 Alcohol abuse, uncomplicated: Secondary | ICD-10-CM

## 2013-12-26 DIAGNOSIS — F418 Other specified anxiety disorders: Secondary | ICD-10-CM

## 2013-12-26 DIAGNOSIS — R739 Hyperglycemia, unspecified: Secondary | ICD-10-CM

## 2013-12-26 DIAGNOSIS — F329 Major depressive disorder, single episode, unspecified: Secondary | ICD-10-CM

## 2013-12-26 DIAGNOSIS — Z89422 Acquired absence of other left toe(s): Secondary | ICD-10-CM

## 2013-12-26 DIAGNOSIS — F32A Depression, unspecified: Secondary | ICD-10-CM

## 2013-12-26 DIAGNOSIS — F419 Anxiety disorder, unspecified: Secondary | ICD-10-CM

## 2013-12-26 DIAGNOSIS — IMO0002 Reserved for concepts with insufficient information to code with codable children: Secondary | ICD-10-CM

## 2013-12-26 DIAGNOSIS — I10 Essential (primary) hypertension: Secondary | ICD-10-CM

## 2013-12-26 DIAGNOSIS — I739 Peripheral vascular disease, unspecified: Secondary | ICD-10-CM

## 2013-12-26 LAB — HEMOGLOBIN A1C: Hgb A1c MFr Bld: 5.7 % (ref 4.6–6.5)

## 2013-12-26 LAB — BASIC METABOLIC PANEL
BUN: 23 mg/dL (ref 6–23)
CHLORIDE: 107 meq/L (ref 96–112)
CO2: 19 meq/L (ref 19–32)
Calcium: 9.6 mg/dL (ref 8.4–10.5)
Creatinine, Ser: 1.6 mg/dL — ABNORMAL HIGH (ref 0.4–1.5)
GFR: 46.35 mL/min — ABNORMAL LOW (ref >60.00)
Glucose, Bld: 98 mg/dL (ref 70–99)
Potassium: 4.3 mEq/L (ref 3.5–5.1)
Sodium: 140 mEq/L (ref 135–145)

## 2013-12-26 MED ORDER — ALPRAZOLAM 0.5 MG PO TABS: ORAL_TABLET | ORAL | Status: DC

## 2013-12-26 NOTE — Assessment & Plan Note (Signed)
Controlled, check a BMP 

## 2013-12-26 NOTE — Assessment & Plan Note (Signed)
Currently taking Lyrica,See comments under toe amputation

## 2013-12-26 NOTE — Assessment & Plan Note (Signed)
ABIs 12/22/2013 show bilateral occlusive disease, has an appointment to see Dr. Allyson SabalBerry

## 2013-12-26 NOTE — Assessment & Plan Note (Signed)
Having problems with toe amputations, last A1c showed prediabetes, we'll recheck A1c

## 2013-12-26 NOTE — Patient Instructions (Signed)
Get your blood work before you leave    

## 2013-12-26 NOTE — Assessment & Plan Note (Addendum)
status post the 3th and 2nd toe amputation  11/20/2013 and yesterday. The problem is most likely a combination of neuropathy and peripheral vascular disease as well as possibly osteomyelitis. Definitely recommend to continue see Dr. Ardelle AntonWagoner and keep the appointment to see Dr. Allyson SabalBerry. We'll get another opinion from Dr. Lajoyce Cornersuda, orthopedic surgery I'll send a note to his podiatrist, I wonder if the  surgical pathology showed osteomyelitis , the report is not available in the chart. If that is the case, an ID consult is warranted.  Addendum: bx report--"benign bony and cartilaginous tissue".  Micro ,no growth

## 2013-12-26 NOTE — Progress Notes (Signed)
Pre visit review using our clinic review tool, if applicable. No additional management support is needed unless otherwise documented below in the visit note. 

## 2013-12-26 NOTE — Assessment & Plan Note (Signed)
Unfortunately continue drinking, I again advised moderation

## 2013-12-26 NOTE — Assessment & Plan Note (Signed)
Refill Xanax

## 2013-12-26 NOTE — Progress Notes (Signed)
Subjective:    Patient ID: Gene FretWilliam [name], male    DOB: May 14, 1940, 73 y.o.   MRN: [id number]  DOS:  12/26/2013 Type of visit - description : acute Interval history: Here at my request, in the last 3 months he has been having problems with his left foot, status post the 3th and 2nd toe amputation    11/20/2013 and   yesterday. Reports that the toes got  red, swollen, apparently they self dislocate. Saw Dr. Ardelle AntonWagoner and the surgeries were performed . In his note he mentioned osteomyelitis as the reason of the third toe amputation.  Hypertension, good medication compliance, reports ambulatory BPs are "excellent" at 120. EtOH, still drinking, "more than what I should", duroing the weekends ~ 6-7 beers qd    Neuropathy, currently taking Lyrica until he finished his supply then plans to switch to Neurontin  ROS  Denies fever, chills. No chest pain or difficulty breathing Denies classic palpitations prior to this surgeries. The area of the surgery is covered, has not noted d/c or odor  Past Medical History  Diagnosis Date  . Anxiety and depression 1995    symptoms started aprox 1995, after a divorce  . Hyperlipidemia     high TG  . Hypertension   . Neuropathy   . 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 History of alcohol abuse   . PVD (peripheral vascular disease)     PVD, mild saw Dr Allyson SabalBerry 2012, intolerant to pletal, Rx observation    Past Surgical History  Procedure Laterality Date  . Cholecystectomy    . Tonsillectomy      History   Social History  . Marital Status: Divorced    Spouse Name: N/A    Number of Children: 3  . Years of Education: College   Occupational History  . 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 Smoking status: Never Smoker   . Smokeless tobacco: Never Used  . Alcohol Use: 0.0 oz/week     Comment: h/o abuse, now on weekends only  . Drug Use: No  . Sexual Activity:  Not on file   Other Topics Concern  . Not on file   Social History Narrative    Divorced since 1994; lives by himself, 3 sons, 1 GD  (all live close by)   [place]          Medication List       This list is accurate as of: 12/26/13  6:37 PM.  Always use your most recent med list.               ALPHA LIPOIC ACID PO  Take 600 mg by mouth.     ALPRAZolam 0.5 MG tablet  Commonly known as:  XANAX  take 1 tablet by mouth three times a day if needed for anxiety     amLODipine 5 MG tablet  Commonly known as:  NORVASC  Take 1 tablet (5 mg total) by mouth daily.     b complex vitamins capsule  Take 1 capsule by mouth daily.     cephALEXin 500 MG capsule  Commonly known as:  KEFLEX  Take 1 capsule (500 mg total) by mouth 3 (three) times daily.     ciprofloxacin 500 MG tablet  Commonly known as:  CIPRO  Take 1 tablet (500 mg total) by mouth 2 (two) times daily.  citalopram 20 MG tablet  Commonly known as:  CELEXA  take 2 tablet by mouth once daily     clindamycin 300 MG capsule  Commonly known as:  CLEOCIN  Take 1 capsule (300 mg total) by mouth 3 (three) times daily.     fenofibrate 160 MG tablet  Take 1 tablet (160 mg total) by mouth daily.     gabapentin 300 MG capsule  Commonly known as:  NEURONTIN  one tablet a day for one week, then one tablet twice a day for one week, then one tablet 3 times a day     HYDROcodone-acetaminophen 5-325 MG per tablet  Commonly known as:  NORCO/VICODIN     Krill Oil 1000 MG Caps  Take by mouth.     losartan 100 MG tablet  Commonly known as:  COZAAR  take 1 tablet by mouth once daily     METAMUCIL PO  Take by mouth.     pravastatin 40 MG tablet  Commonly known as:  PRAVACHOL  Take 40 mg by mouth daily.     promethazine 12.5 MG tablet  Commonly known as:  PHENERGAN  Take 1 tablet (12.5 mg total) by mouth every 8 (eight) hours as needed for nausea or vomiting.     traZODone 50 MG tablet  Commonly known as:   DESYREL  Take 1/2 tablet by mouth at bedtime.     VITAMIN B1-B12 PO  Take 2,000 mg by mouth.           Objective:   Physical Exam BP 145/76  Pulse 82  Temp(Src) 97.6 F (36.4 C) (Oral)  Wt 200 lb 2 oz (90.776 kg)  SpO2 95% General -- alert, well-developed, NAD.  Lungs -- normal respiratory effort, no intercostal retractions, no accessory muscle use, and normal breath sounds.  Heart-- normal rate, regular rhythm, no murmur.  Extremities-- mild  pretibial edema bilaterally ; R foot  with no lesions, redness, or discharge. Pedal pulses decreased Left foot, the patient removed the boot however the foot was well wrapped and he didn't like to remove the dressings. Neurologic--  alert & oriented X3. Speech normal  Psych-- Cognition and judgment appear intact. Cooperative with normal attention span and concentration. No anxious or depressed appearing.     Assessment & Plan:

## 2013-12-27 ENCOUNTER — Ambulatory Visit (INDEPENDENT_AMBULATORY_CARE_PROVIDER_SITE_OTHER): Payer: Medicare Other | Admitting: Podiatry

## 2013-12-27 ENCOUNTER — Ambulatory Visit (INDEPENDENT_AMBULATORY_CARE_PROVIDER_SITE_OTHER): Payer: Medicare Other

## 2013-12-27 ENCOUNTER — Encounter: Payer: Self-pay | Admitting: Podiatry

## 2013-12-27 VITALS — BP 123/86 | HR 98 | Temp 97.6°F | Resp 15

## 2013-12-27 DIAGNOSIS — Z9889 Other specified postprocedural states: Secondary | ICD-10-CM

## 2013-12-27 DIAGNOSIS — M869 Osteomyelitis, unspecified: Secondary | ICD-10-CM

## 2013-12-27 DIAGNOSIS — L97521 Non-pressure chronic ulcer of other part of left foot limited to breakdown of skin: Secondary | ICD-10-CM

## 2013-12-29 ENCOUNTER — Encounter: Payer: Self-pay | Admitting: Cardiovascular Disease

## 2013-12-29 ENCOUNTER — Ambulatory Visit (INDEPENDENT_AMBULATORY_CARE_PROVIDER_SITE_OTHER): Payer: 59 | Admitting: Cardiovascular Disease

## 2013-12-29 VITALS — BP 147/81 | HR 77 | Ht 69.0 in | Wt 210.5 lb

## 2013-12-29 DIAGNOSIS — I739 Peripheral vascular disease, unspecified: Secondary | ICD-10-CM

## 2013-12-29 DIAGNOSIS — I10 Essential (primary) hypertension: Secondary | ICD-10-CM

## 2013-12-29 NOTE — Progress Notes (Signed)
Dr Jacqualyn Posey performed a toe amputation of 2,4 met left foot on 12/25/13

## 2013-12-29 NOTE — Assessment & Plan Note (Signed)
Controlled on current medications 

## 2013-12-29 NOTE — Patient Instructions (Signed)
Follow up with Dr Berry as needed.  

## 2013-12-29 NOTE — Progress Notes (Signed)
12/29/2013 Gene [name]   [birth year]  [id number]  Primary Physician Gene OraJose Paz, MD Primary Cardiologist: Gene GessJonathan J. Isel Skufca MD Gene RenoFACP,FACC,FAHA, FSCAI   HPI:  [name] is a 73 year old moderately overweight divorced Caucasian male father of 3 and a grandfather of a grandchild who I last saw in the office 03/06/11. He was initially referred to me by Dr. Leeanne [name] for last evaluation.  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 Is clinically such profiles remarkable for hypertension and hyperlipidemia. He does not smoke but drinks 4-5 beers a day thought to be potentially related to his peripheral neuropathy. He's never had a heart attack or stroke and denies chest pain or shortness of breath. He recently had a petition because of nonhealing infected ulcer of his left second and third toes which apparently are healing well. Doppler flow showed ABIs are not obtainable because of calcified vessels with shortage of occlusion in the anterior and posterior tibial arteries respectively bilaterally.   Current Outpatient Prescriptions  Medication Sig Dispense Refill  . ALPHA LIPOIC ACID PO Take 600 mg by mouth.      . ALPRAZolam (XANAX) 0.5 MG tablet take 1 tablet by mouth three times a day if needed for anxiety  90 tablet  2  . amLODipine (NORVASC) 5 MG tablet Take 1 tablet (5 mg total) by mouth daily.  30 tablet  6  . b complex vitamins capsule Take 1 capsule by mouth daily.      . cephALEXin (KEFLEX) 500 MG capsule Take 1 capsule (500 mg total) by mouth 3 (three) times daily.  30 capsule  2  . ciprofloxacin (CIPRO) 500 MG tablet Take 1 tablet (500 mg total) by mouth 2 (two) times daily.  28 tablet  2  . citalopram (CELEXA) 20 MG tablet take 2 tablet by mouth once daily  180 tablet  0  . clindamycin (CLEOCIN) 300 MG capsule Take 1 capsule (300 mg total) by mouth 3 (three) times daily.  30 capsule  2  . fenofibrate 160 MG tablet Take 1 tablet (160 mg  total) by mouth daily.  30 tablet  6  . gabapentin (NEURONTIN) 300 MG capsule one tablet a day for one week, then one tablet twice a day for one week, then one tablet 3 times a day  90 capsule  3  . HYDROcodone-acetaminophen (NORCO/VICODIN) 5-325 MG per tablet       . Krill Oil 1000 MG CAPS Take by mouth.      . losartan (COZAAR) 100 MG tablet take 1 tablet by mouth once daily  90 tablet  0  . pravastatin (PRAVACHOL) 40 MG tablet Take 40 mg by mouth daily.      . promethazine (PHENERGAN) 12.5 MG tablet Take 1 tablet (12.5 mg total) by mouth every 8 (eight) hours as needed for nausea or vomiting.  20 tablet  0  . Psyllium (METAMUCIL PO) Take by mouth.      . traZODone (DESYREL) 50 MG tablet Take 1/2 tablet by mouth at bedtime.  15 tablet  6  . VITAMIN B1-B12 PO Take 2,000 mg by mouth.       No current facility-administered medications for this visit.    No Known Allergies  History   Social History  . Marital Status: Divorced    Spouse Name: N/A    Number of Children: 3  . Years of Education: College   Occupational History  . 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 Smoking status: Never Smoker   . Smokeless tobacco: Never Used  . Alcohol Use: 0.0 oz/week     Comment: h/o abuse, now on weekends only  . Drug Use: No  . Sexual Activity: Not on file   Other Topics Concern  . Not on file   Social History Narrative    Divorced since 1994; lives by himself, 3 sons, 1 GD  (all live close by)   [place]       Review of Systems: General: negative for chills, fever, night sweats or weight changes.  Cardiovascular: negative for chest pain, dyspnea on exertion, edema, orthopnea, palpitations, paroxysmal nocturnal dyspnea or shortness of breath Dermatological: negative for rash Respiratory: negative for cough or wheezing Urologic: negative for hematuria Abdominal: negative for nausea, vomiting, diarrhea, bright red blood per  rectum, melena, or hematemesis Neurologic: negative for visual changes, syncope, or dizziness All other systems reviewed and are otherwise negative except as noted above.    Blood pressure 147/81, pulse 77, height 5\' 9"  (1.753 m), weight 210 lb 8 oz (95.482 kg).  General appearance: alert and no distress Neck: no adenopathy, no carotid bruit, no JVD, supple, symmetrical, trachea midline and thyroid not enlarged, symmetric, no tenderness/mass/nodules Lungs: clear to auscultation bilaterally Heart: regular rate and rhythm, S1, S2 normal, no murmur, click, rub or gallop Extremities: extremities normal, atraumatic, no cyanosis or edema  EKG normal sinus rhythm at 77 without ST or T wave changes  ASSESSMENT AND PLAN:   PVD (peripheral vascular disease) The patient recently had amputation of his left second and third toes. Apparently these are healing well.he had arterial Doppler studies performed on 11/17/13 in the office that showed showed a complete occlusion in the anterior and posterior tibial arteries bilaterally. ABIs were unobtainable because of calcification. He does have peripheral neuropathy. He denies claudication.at this point, given the fact that his surgical wounds are healing I do not feel that a more aggressive approach such as angiography would be warranted. We will continue to follow him conservatively. He has weekly followup visit with Dr. Ardelle [name] , his podiatrist.  HYPERLIPIDEMIA On statin therapy, followed by his PCP. If this was a lipid profile performed 06/23/13 revealed [name] LDL of 103 with a triglyceride level of 515. He does drink 4-5 beers a day.  HTN (hypertension) Controlled on current medications      Gene GessJonathan J. Alayzha An MD [facility] LtdFACP,FACC,FAHA, [facility]SCAI 12/29/2013 2:34 PM

## 2013-12-29 NOTE — Addendum Note (Signed)
Addended by: Dorette GrateFAULKNER, Demon Volante C on: 12/29/2013 04:37 PM   Modules accepted: Orders

## 2013-12-29 NOTE — Assessment & Plan Note (Signed)
On statin therapy, followed by his PCP. If this was a lipid profile performed 06/23/13 revealed an LDL of 103 with a triglyceride level of 515. He does drink 4-5 beers a day.

## 2013-12-29 NOTE — Assessment & Plan Note (Signed)
The patient recently had amputation of his left second and third toes. Apparently these are healing well.he had arterial Doppler studies performed on 11/17/13 in the office that showed showed a complete occlusion in the anterior and posterior tibial arteries bilaterally. ABIs were unobtainable because of calcification. He does have peripheral neuropathy. He denies claudication.at this point, given the fact that his surgical wounds are healing I do not feel that a more aggressive approach such as angiography would be warranted. We will continue to follow him conservatively. He has weekly followup visit with Dr. Ardelle AntonWagoner , his podiatrist.

## 2013-12-30 ENCOUNTER — Encounter: Payer: Medicare Other | Admitting: Podiatry

## 2013-12-30 ENCOUNTER — Telehealth: Payer: Self-pay | Admitting: Podiatry

## 2013-12-30 NOTE — Telephone Encounter (Signed)
Patient called me directly on my personal cell phone on Friday, 12/29/2013 at 6:40 PM. Patient called to inform me of his visit with Dr. Gery PrayBarry. Patient also asked that he needed to change his dressing on Friday as he changed it on Thursday and the incision "looked good". He states that "it is a pain" to change the dressing. I discussed with him that he needs to change the bandage daily to monitor for any signs or symptoms of infection. Also discussed with the patient due to fourth toe likely osteo which has progressed this week on x-ray compared to prior and history of second and third toe amputations recommend ID evaluation. Patient agrees and I will plan an referral for this. States that he is a appointment with Dr. Lajoyce Cornersuda on Monday 01/01/14. Follow up as scheduled, or sooner if needed. Call with any questions or concerns.

## 2013-12-31 ENCOUNTER — Encounter: Payer: Self-pay | Admitting: Podiatry

## 2013-12-31 NOTE — Progress Notes (Signed)
Patient ID: Rosana FretWilliam Veltre, male   DOB: 1940/03/22, 73 y.o.   MRN: [id number]  Subjective: [name], 73 year old male, returns to the office today 2 days' status post left partial second ray amputation due to exposed proximal phalanx and wound on the medial aspect of the second digit. Patient denies any fevers, chills, nausea, vomiting. He has been continuing with clindamycin and ciprofloxacin. Patient also states that he has been taking extra doses of the antibiotic in hopes of clearing the infection. On the day of surgery patient states that he was taping the end of the second digit around the ankle to help take the pressure off of the big toe as there was a wound in between these toes. The day after taping he noticed the "hard area" from the side of the second toe. He has been continuing to wear the CAM boot. He was seen by his primary care physician yesterday. He has an appointment with Dr. Gery PrayBarry on Friday. No acute changes since last appointment. No complaints this time.  Objective: AAO 3, NAD DP/PT pulses decrease, CRT less than 3 seconds Protective sensation decreased with Semmes-Weinstein monofilament Incision from partial second ray amputation well coapted without any evidence of dehiscence. Sutures intact. There is mild erythema around the surgical incision and around the medial forefoot. These areas correspond to the same last Friday. Drain is in place. Upon removal of the drain the area was evaluated and there is no purulence expressed. Only serosanguineous drainage was noted. Wound on the lateral aspect of the hallux appears to be decreased in size compared to the day of surgery. There is no probing, undermining, tunneling.  Wound on the lateral aspect of the fourth digit is granular with the area of fibrotic tissue on the distal lateral aspect. There is no probing or exposed bone. Granular ulceration on the medial aspect of the fifth digit with evidence of epidermolysis from prior bulla  formation. No probing. No exposed bone. No calf pain with compression, swelling, warmth, erythema.   Assessment: 73 year old male status post left third partial ray amputation due to osteomyelitis, 2 days status post partial second ray amputation due to exposed bone/cellulitius/ulceration. Wounds likely from pressure on the lateral hallux, lateral fourth digit, fifth medial digit.   Plan: -X-rays were obtained and reviewed with the patient. There is evidence of ostial lysis of the middle phalanx on the fourth digit. This appears to be progressed compared to prior x-rays, concerning for osteomyelitis.  -Conservative versus surgical therapy discussed including alternatives, risks, complications.  -Incision status post partial second ray amputation with sutures intact healing appropriately at this timeframe. Drain was removed in total.  -Continue clindamycin and ciprofloxacin. Discussed with the patient that he needs to take the antibiotics as directed and he should not take extra doses of the antibiotic as they can cause organ damage.  -Showed the patient in great detail how to dress his foot. I recommend changing the dressing on a daily basis to keep the areas clean and to help take pressure off of the areas. Discussed with him not to tape any areas together or apart. Monitor for any signs or symptoms of worsening infection and directed to go directly to the emergency room if any are to occur. Patient states that he does not want to have to go to the emergency room by discussed with him that if infection worsens he is a risk of losing more of his foot.  -ID consult.  -Follow-up cultures/pathology from recent surgery. -Patient has an  appointment a follow-up with Dr. Lajoyce Cornersuda for second opinion.  -Follow-up in 1 week or sooner if any problems are to arise or any change in symptoms. In the meantime call the office or myself any questions, concerns.

## 2014-01-01 ENCOUNTER — Encounter (HOSPITAL_COMMUNITY): Payer: Self-pay | Admitting: Pharmacy Technician

## 2014-01-01 ENCOUNTER — Telehealth: Payer: Self-pay | Admitting: Podiatry

## 2014-01-01 ENCOUNTER — Other Ambulatory Visit (HOSPITAL_COMMUNITY): Payer: Self-pay | Admitting: Orthopedic Surgery

## 2014-01-01 NOTE — Telephone Encounter (Signed)
Patient called me directly on my personal cell phone to discuss his evaluation with Dr. Lajoyce Cornersuda today. He states that he wants to perform a transmetatarsal amputation. Patient was calling to discuss this with me and to inform me of the appointment. He states that today the bone on the fourth digit was exposed. I discussed with him that as he already had the second and third digit amputation and at minimum would need a fourth digit amputation he would functionally be better at a transmetatarsal amputation. He had questions about how he would be functional after the amputation. I discussed with him that he can have a filler made for his shoes after the amputation. He will be having surgery this Wednesday.  I discussed with him that if he has any further questions/concerns/or if there is anything I can help him with, feel free to contact me. Also discussed continued follow up for contralateral extremity to help prevent any future problems.

## 2014-01-02 ENCOUNTER — Encounter: Payer: Self-pay | Admitting: Podiatry

## 2014-01-02 ENCOUNTER — Encounter (HOSPITAL_COMMUNITY): Payer: Self-pay | Admitting: *Deleted

## 2014-01-02 MED ORDER — CEFAZOLIN SODIUM-DEXTROSE 2-3 GM-% IV SOLR
2.0000 g | INTRAVENOUS | Status: AC
  Administered 2014-01-03: 2 g via INTRAVENOUS
  Filled 2014-01-02: qty 50

## 2014-01-02 NOTE — Progress Notes (Signed)
Pt denies SOB, chest pain, and being under the care of a cardiologist. Pt denies having a stress test, echo and cardiac cath. 

## 2014-01-02 NOTE — Progress Notes (Signed)
01/02/14 1442  OBSTRUCTIVE SLEEP APNEA  Have you ever been diagnosed with sleep apnea through a sleep study? No  If yes, do you have and use a CPAP or BPAP machine every night? 0  Do you snore loudly (loud enough to be heard through closed doors)?  1  Do you often feel tired, fatigued, or sleepy during the daytime? 0  Has anyone observed you stop breathing during your sleep? 0  Do you have, or are you being treated for high blood pressure? 1  BMI more than 35 kg/m2? 0  Age over 667 years old? 1  Gender: 1  Obstructive Sleep Apnea Score 4

## 2014-01-03 ENCOUNTER — Ambulatory Visit (HOSPITAL_COMMUNITY): Payer: Medicare Other | Admitting: Anesthesiology

## 2014-01-03 ENCOUNTER — Observation Stay (HOSPITAL_COMMUNITY)
Admission: RE | Admit: 2014-01-03 | Discharge: 2014-01-04 | Disposition: A | Discharge status: Home / Self Care | Payer: Medicare Other | Source: Ambulatory Visit | Attending: Orthopedic Surgery | Admitting: Orthopedic Surgery

## 2014-01-03 ENCOUNTER — Encounter: Payer: Medicare Other | Admitting: Podiatry

## 2014-01-03 ENCOUNTER — Encounter (HOSPITAL_COMMUNITY)
Admission: RE | Disposition: A | Discharge status: Home / Self Care | Payer: Self-pay | Source: Ambulatory Visit | Attending: Orthopedic Surgery

## 2014-01-03 ENCOUNTER — Encounter: Payer: Self-pay | Admitting: Podiatry

## 2014-01-03 ENCOUNTER — Encounter (HOSPITAL_COMMUNITY): Payer: Self-pay | Admitting: Certified Registered"

## 2014-01-03 ENCOUNTER — Encounter (HOSPITAL_COMMUNITY): Payer: Medicare Other | Admitting: Anesthesiology

## 2014-01-03 DIAGNOSIS — I739 Peripheral vascular disease, unspecified: Secondary | ICD-10-CM | POA: Diagnosis not present

## 2014-01-03 DIAGNOSIS — Z89439 Acquired absence of unspecified foot: Secondary | ICD-10-CM

## 2014-01-03 DIAGNOSIS — M86672 Other chronic osteomyelitis, left ankle and foot: Secondary | ICD-10-CM | POA: Diagnosis not present

## 2014-01-03 DIAGNOSIS — I10 Essential (primary) hypertension: Secondary | ICD-10-CM | POA: Diagnosis not present

## 2014-01-03 DIAGNOSIS — L97529 Non-pressure chronic ulcer of other part of left foot with unspecified severity: Secondary | ICD-10-CM | POA: Insufficient documentation

## 2014-01-03 DIAGNOSIS — F418 Other specified anxiety disorders: Secondary | ICD-10-CM | POA: Insufficient documentation

## 2014-01-03 DIAGNOSIS — Z89432 Acquired absence of left foot: Secondary | ICD-10-CM

## 2014-01-03 HISTORY — DX: Cardiac murmur, unspecified: R01.1

## 2014-01-03 HISTORY — PX: AMPUTATION: SHX166

## 2014-01-03 HISTORY — DX: Family history of other specified conditions: Z84.89

## 2014-01-03 LAB — CBC
HCT: 41.7 % (ref 39.0–52.0)
Hemoglobin: 13.9 g/dL (ref 13.0–17.0)
MCH: 31 pg (ref 26.0–34.0)
MCHC: 33.3 g/dL (ref 30.0–36.0)
MCV: 93.1 fL (ref 78.0–100.0)
Platelets: 294 10*3/uL (ref 150–400)
RBC: 4.48 MIL/uL (ref 4.22–5.81)
RDW: 13.2 % (ref 11.5–15.5)
WBC: 5.4 10*3/uL (ref 4.0–10.5)

## 2014-01-03 LAB — COMPREHENSIVE METABOLIC PANEL
ALT: 13 U/L (ref 0–53)
AST: 20 U/L (ref 0–37)
Albumin: 4.2 g/dL (ref 3.5–5.2)
Alkaline Phosphatase: 33 U/L — ABNORMAL LOW (ref 39–117)
Anion gap: 15 (ref 5–15)
BUN: 19 mg/dL (ref 6–23)
CALCIUM: 9.4 mg/dL (ref 8.4–10.5)
CO2: 20 meq/L (ref 19–32)
CREATININE: 1.19 mg/dL (ref 0.50–1.35)
Chloride: 106 mEq/L (ref 96–112)
GFR, EST AFRICAN AMERICAN: 69 mL/min — AB (ref >90)
GFR, EST NON AFRICAN AMERICAN: 59 mL/min — AB (ref >90)
Glucose, Bld: 108 mg/dL — ABNORMAL HIGH (ref 70–99)
Potassium: 4 mEq/L (ref 3.7–5.3)
SODIUM: 141 meq/L (ref 137–147)
TOTAL PROTEIN: 7.5 g/dL (ref 6.0–8.3)
Total Bilirubin: 0.3 mg/dL (ref 0.3–1.2)

## 2014-01-03 LAB — APTT: aPTT: 28 seconds (ref 24–37)

## 2014-01-03 LAB — PROTIME-INR
INR: 1.13 (ref 0.00–1.49)
PROTHROMBIN TIME: 14.6 s (ref 11.6–15.2)

## 2014-01-03 SURGERY — AMPUTATION, FOOT, PARTIAL
Anesthesia: General | Site: Foot | Laterality: Left

## 2014-01-03 MED ORDER — LIDOCAINE HCL (CARDIAC) 20 MG/ML IV SOLN
INTRAVENOUS | Status: AC
  Filled 2014-01-03: qty 5

## 2014-01-03 MED ORDER — ONDANSETRON HCL 4 MG/2ML IJ SOLN: 4.0000 mg | Freq: Four times a day (QID) | INTRAMUSCULAR | Status: DC | PRN

## 2014-01-03 MED ORDER — ASPIRIN EC 325 MG PO TBEC
325.0000 mg | DELAYED_RELEASE_TABLET | Freq: Every day | ORAL | Status: DC
Start: 2014-01-03 — End: 2014-01-04
  Administered 2014-01-03: 325 mg via ORAL
  Filled 2014-01-03 (×2): qty 1

## 2014-01-03 MED ORDER — ONDANSETRON HCL 4 MG/2ML IJ SOLN
INTRAMUSCULAR | Status: AC
  Filled 2014-01-03: qty 8

## 2014-01-03 MED ORDER — METOCLOPRAMIDE HCL 5 MG PO TABS: 5.0000 mg | ORAL_TABLET | Freq: Three times a day (TID) | ORAL | Status: DC | PRN

## 2014-01-03 MED ORDER — PROMETHAZINE HCL 25 MG/ML IJ SOLN: 6.2500 mg | INTRAMUSCULAR | Status: DC | PRN

## 2014-01-03 MED ORDER — METHOCARBAMOL 500 MG PO TABS
500.0000 mg | ORAL_TABLET | Freq: Four times a day (QID) | ORAL | Status: DC | PRN
  Administered 2014-01-03: 500 mg via ORAL
  Filled 2014-01-03: qty 1

## 2014-01-03 MED ORDER — MIDAZOLAM HCL 5 MG/5ML IJ SOLN
INTRAMUSCULAR | Status: DC | PRN
  Administered 2014-01-03: 2 mg via INTRAVENOUS

## 2014-01-03 MED ORDER — DEXAMETHASONE SODIUM PHOSPHATE 4 MG/ML IJ SOLN
INTRAMUSCULAR | Status: DC | PRN
  Administered 2014-01-03: 4 mg via INTRAVENOUS

## 2014-01-03 MED ORDER — FENTANYL CITRATE 0.05 MG/ML IJ SOLN
INTRAMUSCULAR | Status: DC | PRN
  Administered 2014-01-03 (×2): 25 ug via INTRAVENOUS

## 2014-01-03 MED ORDER — LIDOCAINE HCL (CARDIAC) 20 MG/ML IV SOLN
INTRAVENOUS | Status: DC | PRN
  Administered 2014-01-03: 100 mg via INTRAVENOUS

## 2014-01-03 MED ORDER — FENTANYL CITRATE 0.05 MG/ML IJ SOLN
INTRAMUSCULAR | Status: AC
  Filled 2014-01-03: qty 5

## 2014-01-03 MED ORDER — HYDROMORPHONE HCL 1 MG/ML IJ SOLN: 0.5000 mg | INTRAMUSCULAR | Status: DC | PRN

## 2014-01-03 MED ORDER — GLYCOPYRROLATE 0.2 MG/ML IJ SOLN
INTRAMUSCULAR | Status: AC
Start: 2014-01-03 — End: 2014-01-03
  Filled 2014-01-03: qty 2

## 2014-01-03 MED ORDER — GLYCOPYRROLATE 0.2 MG/ML IJ SOLN
INTRAMUSCULAR | Status: AC
  Filled 2014-01-03: qty 1

## 2014-01-03 MED ORDER — MEPERIDINE HCL 25 MG/ML IJ SOLN: 6.2500 mg | INTRAMUSCULAR | Status: DC | PRN

## 2014-01-03 MED ORDER — DEXAMETHASONE SODIUM PHOSPHATE 4 MG/ML IJ SOLN
INTRAMUSCULAR | Status: AC
  Filled 2014-01-03: qty 1

## 2014-01-03 MED ORDER — PROPOFOL 10 MG/ML IV BOLUS
INTRAVENOUS | Status: AC
  Filled 2014-01-03: qty 20

## 2014-01-03 MED ORDER — MIDAZOLAM HCL 2 MG/2ML IJ SOLN
INTRAMUSCULAR | Status: AC
  Filled 2014-01-03: qty 2

## 2014-01-03 MED ORDER — METOCLOPRAMIDE HCL 5 MG/ML IJ SOLN: 5.0000 mg | Freq: Three times a day (TID) | INTRAMUSCULAR | Status: DC | PRN

## 2014-01-03 MED ORDER — OXYCODONE-ACETAMINOPHEN 5-325 MG PO TABS
1.0000 | ORAL_TABLET | ORAL | Status: DC | PRN
  Administered 2014-01-03: 2 via ORAL
  Filled 2014-01-03: qty 2

## 2014-01-03 MED ORDER — ONDANSETRON HCL 4 MG/2ML IJ SOLN
INTRAMUSCULAR | Status: DC | PRN
  Administered 2014-01-03: 4 mg via INTRAVENOUS

## 2014-01-03 MED ORDER — ONDANSETRON HCL 4 MG PO TABS
4.0000 mg | ORAL_TABLET | Freq: Four times a day (QID) | ORAL | Status: DC | PRN
Start: 2014-01-03 — End: 2014-01-04

## 2014-01-03 MED ORDER — SODIUM CHLORIDE 0.9 % IV SOLN
INTRAVENOUS | Status: DC
  Administered 2014-01-03: 16:00:00 via INTRAVENOUS

## 2014-01-03 MED ORDER — LACTATED RINGERS IV SOLN
INTRAVENOUS | Status: DC
Start: 2014-01-03 — End: 2014-01-03
  Administered 2014-01-03: 11:00:00 via INTRAVENOUS

## 2014-01-03 MED ORDER — CEFAZOLIN SODIUM-DEXTROSE 2-3 GM-% IV SOLR
2.0000 g | Freq: Four times a day (QID) | INTRAVENOUS | Status: AC
  Administered 2014-01-03 – 2014-01-04 (×3): 2 g via INTRAVENOUS
  Filled 2014-01-03 (×4): qty 50

## 2014-01-03 MED ORDER — METHOCARBAMOL 1000 MG/10ML IJ SOLN
500.0000 mg | Freq: Four times a day (QID) | INTRAVENOUS | Status: DC | PRN
  Filled 2014-01-03: qty 5

## 2014-01-03 MED ORDER — DOCUSATE SODIUM 100 MG PO CAPS
100.0000 mg | ORAL_CAPSULE | Freq: Two times a day (BID) | ORAL | Status: DC
  Administered 2014-01-03: 100 mg via ORAL
  Filled 2014-01-03: qty 1

## 2014-01-03 MED ORDER — PROPOFOL 10 MG/ML IV BOLUS
INTRAVENOUS | Status: DC | PRN
  Administered 2014-01-03: 150 mg via INTRAVENOUS

## 2014-01-03 MED ORDER — FENTANYL CITRATE 0.05 MG/ML IJ SOLN: 25.0000 ug | INTRAMUSCULAR | Status: DC | PRN

## 2014-01-03 SURGICAL SUPPLY — 29 items
BLADE SAW SGTL HD 18.5X60.5X1. (BLADE) ×3 IMPLANT
BLADE SURG 10 STRL SS (BLADE) IMPLANT
BNDG COHESIVE 4X5 TAN STRL (GAUZE/BANDAGES/DRESSINGS) ×6 IMPLANT
BNDG GAUZE ELAST 4 BULKY (GAUZE/BANDAGES/DRESSINGS) ×3 IMPLANT
COVER SURGICAL LIGHT HANDLE (MISCELLANEOUS) ×3 IMPLANT
DRAPE U-SHAPE 47X51 STRL (DRAPES) ×3 IMPLANT
DRSG ADAPTIC 3X8 NADH LF (GAUZE/BANDAGES/DRESSINGS) ×3 IMPLANT
DRSG PAD ABDOMINAL 8X10 ST (GAUZE/BANDAGES/DRESSINGS) ×3 IMPLANT
DURAPREP 26ML APPLICATOR (WOUND CARE) ×3 IMPLANT
ELECT REM PT RETURN 9FT ADLT (ELECTROSURGICAL) ×3
ELECTRODE REM PT RTRN 9FT ADLT (ELECTROSURGICAL) ×1 IMPLANT
GAUZE SPONGE 4X4 12PLY STRL (GAUZE/BANDAGES/DRESSINGS) ×3 IMPLANT
GLOVE BIOGEL PI IND STRL 9 (GLOVE) ×1 IMPLANT
GLOVE BIOGEL PI INDICATOR 9 (GLOVE) ×2
GLOVE SURG ORTHO 9.0 STRL STRW (GLOVE) ×3 IMPLANT
GOWN STRL REUS W/ TWL XL LVL3 (GOWN DISPOSABLE) ×3 IMPLANT
GOWN STRL REUS W/TWL XL LVL3 (GOWN DISPOSABLE) ×6
KIT BASIN OR (CUSTOM PROCEDURE TRAY) ×3 IMPLANT
KIT ROOM TURNOVER OR (KITS) ×3 IMPLANT
NS IRRIG 1000ML POUR BTL (IV SOLUTION) ×3 IMPLANT
PACK ORTHO EXTREMITY (CUSTOM PROCEDURE TRAY) ×3 IMPLANT
PAD ARMBOARD 7.5X6 YLW CONV (MISCELLANEOUS) ×6 IMPLANT
SPONGE GAUZE 4X4 12PLY STER LF (GAUZE/BANDAGES/DRESSINGS) ×3 IMPLANT
SPONGE LAP 18X18 X RAY DECT (DISPOSABLE) ×3 IMPLANT
SUT ETHILON 2 0 PSLX (SUTURE) ×9 IMPLANT
SUT VIC AB 2-0 CTB1 (SUTURE) IMPLANT
TOWEL OR 17X24 6PK STRL BLUE (TOWEL DISPOSABLE) ×3 IMPLANT
TOWEL OR 17X26 10 PK STRL BLUE (TOWEL DISPOSABLE) ×3 IMPLANT
WATER STERILE IRR 1000ML POUR (IV SOLUTION) ×3 IMPLANT

## 2014-01-03 NOTE — Anesthesia Postprocedure Evaluation (Signed)
  Anesthesia Post-op Note  Patient: Rosana FretWilliam Morado  Procedure(s) Performed: Procedure(s): Left Transmetatarsal Amputation (Left)  Patient Location: PACU  Anesthesia Type:General  Level of Consciousness: awake, alert  and oriented  Airway and Oxygen Therapy: Patient Spontanous Breathing and Patient connected to nasal cannula oxygen  Post-op Pain: mild  Post-op Assessment: Post-op Vital signs reviewed, Patient's Cardiovascular Status Stable, Respiratory Function Stable, Patent Airway and No signs of Nausea or vomiting  Post-op Vital Signs: Reviewed and stable  Last Vitals:  Filed Vitals:   01/03/14 1247  BP: 134/70  Pulse: 82  Temp:   Resp: 11    Complications: No apparent anesthesia complications

## 2014-01-03 NOTE — Transfer of Care (Signed)
Immediate Anesthesia Transfer of Care Note  Patient: Gene [name]  Procedure(s) Performed: Procedure(s): Left Transmetatarsal Amputation (Left)  Patient Location: PACU  Anesthesia Type:General  Level of Consciousness: awake, alert , oriented and patient cooperative  Airway & Oxygen Therapy: Patient Spontanous Breathing and Patient connected to nasal cannula oxygen  Post-op Assessment: Report given to PACU RN, Post -op Vital signs reviewed and stable and Patient moving all extremities  Post vital signs: Reviewed and stable  Complications: No apparent anesthesia complications

## 2014-01-03 NOTE — H&P (Signed)
Gene [name] is an 73 y.o. male.   Chief Complaint: Ulceration osteomyelitis left forefoot HPI: Patient is a 73 year old gentleman with peripheral vascular disease who has had progressive ulceration osteomyelitis of the left forefoot  Past Medical History  Diagnosis Date  . Anxiety and depression 1995    symptoms started aprox 1995, after a divorce  . Hyperlipidemia     high TG  . Hypertension   . Neuropathy   . 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 History of alcohol abuse   . PVD (peripheral vascular disease)     PVD, mild saw Dr Allyson SabalBerry 2012, intolerant to pletal, Rx observation  . Family history of anesthesia complication     "father would start seeing things"  . Heart murmur     as a baby " outgrew"    Past Surgical History  Procedure Laterality Date  . Cholecystectomy    . Tonsillectomy    . Amputation  11/20/13 and 11/24/13    toe    Family History  Problem Relation Age of Onset  . Hyperlipidemia Mother   . Lung cancer Father     asbestos releated  . Colon polyps Father   . Diabetes Neg Hx   . Coronary artery disease Neg Hx   . Prostate cancer Neg Hx    Social History:  reports that he has never smoked. He has never used smokeless tobacco. He reports that he drinks alcohol. He reports that he does not use illicit drugs.  Allergies: No Known Allergies  No prescriptions prior to admission    No results found for this or any previous visit (from the past 48 hour(s)). No results found.  Review of Systems  All other systems reviewed and are negative.   There were no vitals taken for this visit. Physical Exam  Operable pulses. There is ulceration osteomyelitis of the left forefoot. Assessment/Plan Assessment: Ulceration osteomyelitis left forefoot.  Plan: We'll plan for left transmetatarsal amputation. Risks and benefits were discussed including risk of the wound not healing potential for  a higher level amputation. Patient  states he understands and wishes to proceed at this time.  Annalisa Colonna V 01/03/2014, 6:51 AM

## 2014-01-03 NOTE — Anesthesia Preprocedure Evaluation (Addendum)
Anesthesia Evaluation  Patient identified by MRN, date of birth, ID band Patient awake    Reviewed: Allergy & Precautions, H&P , NPO status , Patient's Chart, lab work & pertinent test results  Airway Mallampati: II   Neck ROM: Full    Dental  (+) Teeth Intact, Dental Advisory Given   Pulmonary neg pulmonary ROS,  breath sounds clear to auscultation        Cardiovascular hypertension, + Peripheral Vascular Disease Rhythm:Regular     Neuro/Psych Anxiety Depression    GI/Hepatic   Endo/Other  negative endocrine ROS  Renal/GU      Musculoskeletal   Abdominal (+)  Abdomen: soft.    Peds  Hematology   Anesthesia Other Findings   Reproductive/Obstetrics                           Anesthesia Physical Anesthesia Plan  ASA: III  Anesthesia Plan: General   Post-op Pain Management:    Induction: Intravenous  Airway Management Planned: LMA and Oral ETT  Additional Equipment:   Intra-op Plan:   Post-operative Plan: Extubation in OR  Informed Consent: I have reviewed the patients History and Physical, chart, labs and discussed the procedure including the risks, benefits and alternatives for the proposed anesthesia with the patient or authorized representative who has indicated his/her understanding and acceptance.     Plan Discussed with:   Anesthesia Plan Comments: (Check today labs, PVD, no smoke, no DM, HTN, )        Anesthesia Quick Evaluation

## 2014-01-03 NOTE — Anesthesia Procedure Notes (Signed)
Procedure Name: LMA Insertion Date/Time: 01/03/2014 12:17 PM Performed by: Jerilee HohMUMM, Sarafina Puthoff N Pre-anesthesia Checklist: Patient identified, Emergency Drugs available, Suction available and Patient being monitored Patient Re-evaluated:Patient Re-evaluated prior to inductionOxygen Delivery Method: Circle system utilized Preoxygenation: Pre-oxygenation with 100% oxygen Intubation Type: IV induction Ventilation: Mask ventilation without difficulty LMA: LMA inserted LMA Size: 5.0 Tube type: Oral Number of attempts: 1 Placement Confirmation: positive ETCO2 and breath sounds checked- equal and bilateral Tube secured with: Tape Dental Injury: Teeth and Oropharynx as per pre-operative assessment

## 2014-01-03 NOTE — Op Note (Signed)
01/03/2014  12:41 PM  PATIENT:  Gene [name]    PRE-OPERATIVE DIAGNOSIS:  Osteomyelitis Left Foot  POST-OPERATIVE DIAGNOSIS:  Same  PROCEDURE:  Left Transmetatarsal Amputation  SURGEON:  Nadara MustardUDA,Liticia Gasior V, MD  PHYSICIAN ASSISTANT:None ANESTHESIA:   General  PREOPERATIVE INDICATIONS:  Gene [name] is a  73 y.o. male with a diagnosis of Osteomyelitis Left Foot who failed conservative measures and elected for surgical management.    The risks benefits and alternatives were discussed with the patient preoperatively including but not limited to the risks of infection, bleeding, nerve injury, cardiopulmonary complications, the need for revision surgery, among others, and the patient was willing to proceed.  OPERATIVE IMPLANTS: None  OPERATIVE FINDINGS: Good petechial bleeding, calcified vessels  OPERATIVE PROCEDURE: Patient was brought to the operating room and underwent a general anesthetic. After adequate levels of anesthesia were obtained patient's left lower extremity was prepped using DuraPrep draped into a sterile field. A fishmouth incision was made just proximal to his previous surgical incision. The metatarsals were transected proximal to the skin incision with a bevel on the plantar aspect and a cascade laterally. Electrocautery was used for hemostasis. The wound was irrigated. The skin was closed using 2-0 nylon. Wound was covered sterile compressive dressing. Patient was extubated taken to the PACU in stable condition. Plan for overnight observation.

## 2014-01-04 ENCOUNTER — Telehealth: Payer: Self-pay | Admitting: *Deleted

## 2014-01-04 DIAGNOSIS — M86672 Other chronic osteomyelitis, left ankle and foot: Secondary | ICD-10-CM | POA: Diagnosis not present

## 2014-01-04 MED ORDER — ASPIRIN EC 81 MG PO TBEC: 81.0000 mg | DELAYED_RELEASE_TABLET | Freq: Every day | ORAL | Status: DC

## 2014-01-04 MED ORDER — OXYCODONE-ACETAMINOPHEN 5-325 MG PO TABS: 1.0000 | ORAL_TABLET | ORAL | Status: DC | PRN

## 2014-01-04 NOTE — Care Management (Signed)
01-04-14 Spoke with PT , no recommendations , other than crutches which have already been brought to room. Ronny FlurryHeather Fuller Makin RN BSN [phone]

## 2014-01-04 NOTE — Discharge Instructions (Signed)
Keep dressing clean and dry. May change dressing if it becomes soiled.

## 2014-01-04 NOTE — Evaluation (Signed)
Physical Therapy Evaluation and Discharge Patient Details Name: Gene FretWilliam Babiarz MRN: [id number] DOB: 07-26-1940 Today's Date: 01/04/2014   History of Present Illness  73 y.o. male s/p Left Transmetatarsal Amputation  Clinical Impression  Patient is seen following the above procedure and presents with functional limitations due to the deficits listed below (see PT Problem List). Able to ambulate up to 40 feet with supervision while using crutches. Pt declines practicing stair training and reports he will ascend/descend stairs on his bottom. Reports he will have care as needed from neighbors and friends. All education has been reviewed and pt has no further questions, stating he feels confident with his ability to mobilize safely. Feel he is adequate for d/c from a mobility standpoint.       Follow Up Recommendations No PT follow up    Equipment Recommendations  Crutches    Recommendations for Other Services       Precautions / Restrictions Precautions Precautions: Fall Required Braces or Orthoses: Other Brace/Splint (post op shoe) Restrictions Weight Bearing Restrictions: Yes LLE Weight Bearing: Touchdown weight bearing      Mobility  Bed Mobility Overal bed mobility: Modified Independent                Transfers Overall transfer level: Needs assistance Equipment used: Crutches Transfers: Sit to/from Stand Sit to Stand: Supervision         General transfer comment: Supervision for safety. Educated on technique. Did not require any physical assist. Demonstrates good balance upon standing.  Ambulation/Gait Ambulation/Gait assistance: Supervision Ambulation Distance (Feet): 40 Feet Assistive device: Crutches Gait Pattern/deviations: Step-to pattern   Gait velocity interpretation: Below normal speed for age/gender General Gait Details: Educated on safe DME use with crutches. VC for sequencing. No loss of balance during gait. Slow and guarded, demonstrates good  stability with turns.  Stairs            Wheelchair Mobility    Modified Rankin (Stroke Patients Only)       Balance Overall balance assessment: Needs assistance Sitting-balance support: No upper extremity supported;Feet supported Sitting balance-Leahy Scale: Good     Standing balance support: Single extremity supported Standing balance-Leahy Scale: Poor                               Pertinent Vitals/Pain Pain Assessment: 0-10 Pain Score: 0-No pain Pain Intervention(s): Monitored during session    Home Living Family/patient expects to be discharged to:: Private residence Living Arrangements: Alone Available Help at Discharge: Neighbor;Available PRN/intermittently Type of Home: House Home Access: Level entry     Home Layout: Multi-level Home Equipment: None      Prior Function Level of Independence: Independent               Hand Dominance   Dominant Hand: Right    Extremity/Trunk Assessment   Upper Extremity Assessment: Defer to OT evaluation           Lower Extremity Assessment: LLE deficits/detail   LLE Deficits / Details: Able to move ankle, limited assessment due to bandaging     Communication   Communication: No difficulties  Cognition Arousal/Alertness: Awake/alert Behavior During Therapy: WFL for tasks assessed/performed Overall Cognitive Status: Within Functional Limits for tasks assessed                      General Comments General comments (skin integrity, edema, etc.): Declines practicing stair training, states he plans to  scoot up/down stairs on his bottom and has a friend to help him.    Exercises General Exercises - Lower Extremity Ankle Circles/Pumps: AROM;Left;10 reps;Seated Long Arc Quad: Strengthening;Left;5 reps;Seated Hip Flexion/Marching: Strengthening;Left;5 reps;Seated      Assessment/Plan    PT Assessment Patent does not need any further PT services  PT Diagnosis Difficulty  walking   PT Problem List    PT Treatment Interventions     PT Goals (Current goals can be found in the Care Plan section) Acute Rehab PT Goals Patient Stated Goal: Drive my mustang again PT Goal Formulation: All assessment and education complete, DC therapy    Frequency     Barriers to discharge        Co-evaluation               End of Session Equipment Utilized During Treatment: Gait belt Activity Tolerance: Patient tolerated treatment well Patient left: in chair;with call bell/phone within reach Nurse Communication: Mobility status    Functional Assessment Tool Used: clinical observation Functional Limitation: Mobility: Walking and moving around Mobility: Walking and Moving Around Current Status ([id number]): At least 20 percent but less than 40 percent impaired, limited or restricted Mobility: Walking and Moving Around Goal Status [phone]([id number]): At least 20 percent but less than 40 percent impaired, limited or restricted Mobility: Walking and Moving Around Discharge Status [phone](G8980): At least 20 percent but less than 40 percent impaired, limited or restricted    Time: 0858-0924 PT Time Calculation (min): 26 min   Charges:         PT G Codes:   Functional Assessment Tool Used: clinical observation Functional Limitation: Mobility: Walking and moving around   [facility] RudyBarbour, [place]PT [phone]  Berton MountBarbour, Arina Torry S 01/04/2014, 10:42 AM

## 2014-01-04 NOTE — Discharge Summary (Signed)
  Discharged to home in stable condition. Final diagnosis osteomyelitis with ischemic ulcer left foot fourth and fifth toes. Surgical procedure transmetatarsal amputation. Prescriptions provided for Percocet for pain crutches for ambulation. Touchdown weightbearing on the left. Follow up in office in 1 week. Patient's hospital course was essentially unremarkable. He underwent physical therapy and was discharged to home in stable condition after surgery.

## 2014-01-04 NOTE — Progress Notes (Signed)
Patient discharged home with instructions. 

## 2014-01-04 NOTE — Telephone Encounter (Signed)
I called and left him a message that I was just calling to see how he was doing.

## 2014-01-04 NOTE — Progress Notes (Signed)
Orthopedic Tech Progress Note Patient Details:  Gene FretWilliam [name] [birth year] [id number]  Ortho Devices Type of Ortho Device: Postop shoe/boot Ortho Device/Splint Interventions: Application   [name], [name] 01/04/2014, 1:19 AM

## 2014-01-05 ENCOUNTER — Encounter (HOSPITAL_COMMUNITY): Payer: Self-pay | Admitting: Orthopedic Surgery

## 2014-01-08 ENCOUNTER — Encounter: Payer: Self-pay | Admitting: Internal Medicine

## 2014-01-09 ENCOUNTER — Telehealth: Payer: Self-pay | Admitting: Podiatry

## 2014-01-09 NOTE — Telephone Encounter (Signed)
Patient called me directly stating that he has some drainage over the incision where the 4th and 5th toes were. He recently underwent a TMA by Dr. Lajoyce Cornersuda last week. Patient was asking if Dr. Lajoyce Cornersuda would put a stitch in this area at his next appointment. He denies any purulence from the area. Denies any redness around the incision. He states the swelling has decreased. He has a follow-up with Dr. Lajoyce Cornersuda tomorrow, recommend follow-up with him. It does not sound like there is any signs of infection at this time. Continue dressing changes per Dr. Lajoyce Cornersuda. Follow up with me for the contralateral extremity as needed.

## 2014-01-18 ENCOUNTER — Encounter: Payer: Self-pay | Admitting: Internal Medicine

## 2014-01-18 ENCOUNTER — Other Ambulatory Visit: Payer: Self-pay | Admitting: Internal Medicine

## 2014-01-25 ENCOUNTER — Encounter: Payer: 59 | Admitting: Internal Medicine

## 2014-01-26 ENCOUNTER — Other Ambulatory Visit: Payer: Self-pay

## 2014-02-06 ENCOUNTER — Other Ambulatory Visit: Payer: Self-pay

## 2014-02-09 ENCOUNTER — Encounter: Payer: 59 | Admitting: Internal Medicine

## 2014-02-20 ENCOUNTER — Other Ambulatory Visit: Payer: Self-pay | Admitting: Internal Medicine

## 2014-02-20 NOTE — Telephone Encounter (Signed)
Ok RF #15 and 3 RF

## 2014-02-20 NOTE — Telephone Encounter (Signed)
Sent to [facility] on [address]

## 2014-02-20 NOTE — Telephone Encounter (Signed)
Pt is requesting refill on Trazodone.  Last OV: 12/26/2013  Last Fill: 06/23/2013 # [id number]  Okay to Refill?

## 2014-03-09 DIAGNOSIS — K2210 Ulcer of esophagus without bleeding: Secondary | ICD-10-CM

## 2014-03-09 HISTORY — DX: Ulcer of esophagus without bleeding: K22.10

## 2014-03-14 ENCOUNTER — Encounter: Payer: Self-pay | Admitting: Internal Medicine

## 2014-03-15 ENCOUNTER — Encounter: Payer: 59 | Admitting: Internal Medicine

## 2014-03-16 ENCOUNTER — Encounter: Payer: 59 | Admitting: Internal Medicine

## 2014-03-19 ENCOUNTER — Ambulatory Visit (INDEPENDENT_AMBULATORY_CARE_PROVIDER_SITE_OTHER): Payer: Medicare Other | Admitting: Internal Medicine

## 2014-03-19 ENCOUNTER — Encounter: Payer: Self-pay | Admitting: Internal Medicine

## 2014-03-19 ENCOUNTER — Ambulatory Visit (HOSPITAL_BASED_OUTPATIENT_CLINIC_OR_DEPARTMENT_OTHER)
Admission: RE | Admit: 2014-03-19 | Discharge: 2014-03-19 | Disposition: A | Discharge status: Home / Self Care | Payer: Medicare Other | Source: Ambulatory Visit | Attending: Internal Medicine | Admitting: Internal Medicine

## 2014-03-19 VITALS — BP 146/83 | HR 67 | Temp 97.9°F | Wt 211.1 lb

## 2014-03-19 DIAGNOSIS — Z89432 Acquired absence of left foot: Secondary | ICD-10-CM

## 2014-03-19 DIAGNOSIS — R609 Edema, unspecified: Secondary | ICD-10-CM

## 2014-03-19 DIAGNOSIS — I10 Essential (primary) hypertension: Secondary | ICD-10-CM

## 2014-03-19 DIAGNOSIS — R600 Localized edema: Secondary | ICD-10-CM | POA: Insufficient documentation

## 2014-03-19 DIAGNOSIS — E785 Hyperlipidemia, unspecified: Secondary | ICD-10-CM

## 2014-03-19 DIAGNOSIS — F419 Anxiety disorder, unspecified: Secondary | ICD-10-CM

## 2014-03-19 DIAGNOSIS — F418 Other specified anxiety disorders: Secondary | ICD-10-CM

## 2014-03-19 DIAGNOSIS — F329 Major depressive disorder, single episode, unspecified: Secondary | ICD-10-CM

## 2014-03-19 DIAGNOSIS — G629 Polyneuropathy, unspecified: Secondary | ICD-10-CM

## 2014-03-19 MED ORDER — ALPRAZOLAM 0.5 MG PO TABS: 0.5000 mg | ORAL_TABLET | Freq: Three times a day (TID) | ORAL | Status: DC

## 2014-03-19 NOTE — Progress Notes (Signed)
Pre visit review using our clinic review tool, if applicable. No additional management support is needed unless otherwise documented below in the visit note. 

## 2014-03-19 NOTE — Patient Instructions (Signed)
Continue taking your medications Leg elevation to prevent swelling Will get a  ultrasound Come back in one month, fasting for a checkup

## 2014-03-19 NOTE — Progress Notes (Signed)
Subjective:    Patient ID: Rosana FretWilliam Baltimore, male    DOB: 05-Jan-1941, 74 y.o.   MRN: [id number]  DOS:  03/19/2014 Type of visit - description : Several concerns Interval history: In early December, switch Lyrica to gabapentin due to cost, shortly after developed left leg swelling, he thought it was due to gabapentin, discontinued it and the swelling has decreased. Not completely gone His taking probiotics, wonder if is a good idea. See assessment and plan Dyslipidemia, patient restarted Lipitor and also fenofibrate, concern about his cholesterol Hypertension, good medication compliance, ambulatory BPs under excellent control 110, 120/50 Anxiety, needs a refill on Xanax Labs from the hospital reviewed   ROS Has taken antibiotics due to foot infection but currently denies nausea, vomiting, diarrhea or blood in the stools No recent problems with chest pain, difficulty breathing or palpitations   Past Medical History  Diagnosis Date  . Anxiety and depression 1995    symptoms started aprox 1995, after a divorce  . Hyperlipidemia     high TG  . Hypertension   . Neuropathy   . 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 History of alcohol abuse   . PVD (peripheral vascular disease)     PVD, mild saw Dr Allyson SabalBerry 2012, intolerant to pletal, Rx observation  . Family history of anesthesia complication     "father would start seeing things"  . Heart murmur     as a baby " outgrew"    Past Surgical History  Procedure Laterality Date  . Cholecystectomy    . Tonsillectomy    . Amputation  11/20/13 and 11/24/13    toe  . Amputation Left 01/03/2014    Procedure: Left Transmetatarsal Amputation;  Surgeon: Nadara MustardMarcus Duda V, MD;  [facility];  Service: Orthopedics;  Laterality: Left;    History   Social History  . Marital Status: Divorced    Spouse Name: N/A    Number of Children: 3  . Years of Education: College   Occupational History  . 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 Smoking status: Never Smoker   . Smokeless tobacco: Never Used  . Alcohol Use: 0.0 oz/week     Comment: h/o abuse, now on weekends only  . Drug Use: No  . Sexual Activity: Not on file   Other Topics Concern  . Not on file   Social History Narrative    Divorced since 1994; lives by himself, 3 sons, 1 GD  (all live close by)   [place]          Medication List       This list is accurate as of: 03/19/14 11:59 PM.  Always use your most recent med list.               ALPRAZolam 0.5 MG tablet  Commonly known as:  XANAX  Take 1 tablet (0.5 mg total) by mouth 3 (three) times daily.     amLODipine 5 MG tablet  Commonly known as:  NORVASC  Take 1 tablet (5 mg total) by mouth daily.     aspirin EC 81 MG tablet  Take 1 tablet (81 mg total) by mouth daily.     b complex vitamins capsule  Take 1 capsule by mouth daily.     citalopram 40 MG tablet  Commonly known as:  CELEXA  Take 80 mg  by mouth daily.     fenofibrate 160 MG tablet  take 1 tablet by mouth once daily     Krill Oil 1000 MG Caps  Take 1,000 mg by mouth daily.     losartan 100 MG tablet  Commonly known as:  COZAAR  take 1 tablet by mouth once daily     METAMUCIL PO  Take 2 capsules by mouth daily.     pregabalin 150 MG capsule  Commonly known as:  LYRICA  Take 300 mg by mouth daily.     traZODone 50 MG tablet  Commonly known as:  DESYREL  take 1/2 tablet by mouth at bedtime     VITAMIN B1-B12 PO  Take 4,000 mg by mouth 2 (two) times daily.           Objective:   Physical Exam BP 146/83 mmHg  Pulse 67  Temp(Src) 97.9 F (36.6 C) (Oral)  Wt 211 lb 2 oz (95.766 kg)  SpO2 96% General -- alert, well-developed, NAD.   Lungs -- normal respiratory effort, no intercostal retractions, no accessory muscle use, and normal breath sounds.  Heart-- normal rate,t  regular rhythm, no murmur.  Extremities--  R  leg no  edema. LEFT leg: +/+++  pitting edema at the pretibial area, calf approximately 1 cm larger in circumference compared to the right. No tender. Status post partial L foot amputation, walks with some difficulty Neurologic--  alert & oriented X3.  Psych-- Cognition and judgment appear intact. Cooperative with normal attention span and concentration. No anxious or depressed appearing.      Assessment & Plan:    Edema, going on for a few weeks, improved but not completely gone on today's exam. Patient taken amlodipine. Check a ultrasound to rule out DVT  Time spent with the patient more than 25 minutes due to multiple issues and a  number of questions related to probiotics

## 2014-03-20 NOTE — Assessment & Plan Note (Signed)
Osteomyelitis, status post partial amputation of the right foot, has an appointment with  orthopedic surgery this week. Status post antibiotics, wonders if probiotic is a good idea. It is very expensive to him. I don't see a pressing indication to take probiotics if they are very expensive

## 2014-03-20 NOTE — Assessment & Plan Note (Signed)
Hyperlipidemia, back on Lipitor for the last week, also fenofibrate, patient is concerned about his cholesterol. Recommend to come back fasting in one month

## 2014-03-20 NOTE — Assessment & Plan Note (Signed)
Needs a refill on Xanax, symptoms well-controlled

## 2014-03-20 NOTE — Assessment & Plan Note (Signed)
Currently well controlled.  

## 2014-03-20 NOTE — Assessment & Plan Note (Signed)
Neuropathy, see history of present illness, he felt that gabapentin caused swelling, back on Lyrica.

## 2014-04-02 ENCOUNTER — Other Ambulatory Visit: Payer: Self-pay | Admitting: Internal Medicine

## 2014-04-16 ENCOUNTER — Encounter: Payer: 59 | Admitting: Internal Medicine

## 2014-04-19 ENCOUNTER — Other Ambulatory Visit: Payer: Self-pay | Admitting: Internal Medicine

## 2014-04-19 NOTE — Telephone Encounter (Signed)
Print #90 and 1 RF thx

## 2014-04-19 NOTE — Telephone Encounter (Signed)
Pt requesting refill on Alprazolam,  Last OV: 03/19/2014  Last Fill: 03/19/2014 # 90 0RF UDS: 01/20/2013 Low risk  Please advise.

## 2014-04-20 ENCOUNTER — Encounter: Payer: 59 | Admitting: Internal Medicine

## 2014-04-20 NOTE — Telephone Encounter (Signed)
Faxed to [facility] pharmacy

## 2014-04-20 NOTE — Telephone Encounter (Signed)
Printed and awaiting signature by Dr. [name].

## 2014-04-25 ENCOUNTER — Other Ambulatory Visit: Payer: Self-pay | Admitting: Internal Medicine

## 2014-04-25 NOTE — Telephone Encounter (Signed)
Faxed to [facility] pharmacy

## 2014-04-25 NOTE — Telephone Encounter (Signed)
Ok x 1 year 

## 2014-04-25 NOTE — Telephone Encounter (Signed)
Pt is requesting refill on Lyrica.  Last OV: 03/19/2014 Last Fill: 07/20/2013 # 90 1RF  Please advise.

## 2014-04-25 NOTE — Telephone Encounter (Signed)
Printed, awaiting signature by Dr. Paz.  

## 2014-05-09 ENCOUNTER — Encounter: Payer: Self-pay | Admitting: *Deleted

## 2014-05-09 ENCOUNTER — Telehealth: Payer: Self-pay | Admitting: *Deleted

## 2014-05-09 NOTE — Telephone Encounter (Signed)
Pre-Visit Call completed with patient and chart updated.   Pre-Visit Info documented in Specialty Comments under Snap[employer]ot.    

## 2014-05-11 ENCOUNTER — Encounter: Payer: Self-pay | Admitting: Internal Medicine

## 2014-05-11 ENCOUNTER — Ambulatory Visit (INDEPENDENT_AMBULATORY_CARE_PROVIDER_SITE_OTHER): Payer: Medicare Other | Admitting: Internal Medicine

## 2014-05-11 VITALS — BP 132/76 | HR 71 | Temp 97.6°F | Ht 69.0 in | Wt 212.1 lb

## 2014-05-11 DIAGNOSIS — F329 Major depressive disorder, single episode, unspecified: Secondary | ICD-10-CM

## 2014-05-11 DIAGNOSIS — Z0000 Encounter for general adult medical examination without abnormal findings: Secondary | ICD-10-CM

## 2014-05-11 DIAGNOSIS — I10 Essential (primary) hypertension: Secondary | ICD-10-CM

## 2014-05-11 DIAGNOSIS — G4700 Insomnia, unspecified: Secondary | ICD-10-CM

## 2014-05-11 DIAGNOSIS — F1010 Alcohol abuse, uncomplicated: Secondary | ICD-10-CM

## 2014-05-11 DIAGNOSIS — Z125 Encounter for screening for malignant neoplasm of prostate: Secondary | ICD-10-CM

## 2014-05-11 DIAGNOSIS — I739 Peripheral vascular disease, unspecified: Secondary | ICD-10-CM

## 2014-05-11 DIAGNOSIS — F419 Anxiety disorder, unspecified: Secondary | ICD-10-CM

## 2014-05-11 DIAGNOSIS — E785 Hyperlipidemia, unspecified: Secondary | ICD-10-CM

## 2014-05-11 DIAGNOSIS — Z23 Encounter for immunization: Secondary | ICD-10-CM

## 2014-05-11 DIAGNOSIS — R011 Cardiac murmur, unspecified: Secondary | ICD-10-CM

## 2014-05-11 LAB — PSA: PSA: 1.16 ng/mL (ref 0.10–4.00)

## 2014-05-11 LAB — BASIC METABOLIC PANEL
BUN: 27 mg/dL — ABNORMAL HIGH (ref 6–23)
CO2: 25 meq/L (ref 19–32)
Calcium: 9.4 mg/dL (ref 8.4–10.5)
Chloride: 109 mEq/L (ref 96–112)
Creatinine, Ser: 1.18 mg/dL (ref 0.40–1.50)
GFR: 64.37 mL/min (ref >60.00)
GLUCOSE: 100 mg/dL — AB (ref 70–99)
Potassium: 4 mEq/L (ref 3.5–5.1)
Sodium: 141 mEq/L (ref 135–145)

## 2014-05-11 LAB — LIPID PANEL
CHOL/HDL RATIO: 4
CHOLESTEROL: 176 mg/dL (ref 0–200)
HDL: 45.9 mg/dL (ref >39.00)
LDL CALC: 94 mg/dL (ref 0–99)
NonHDL: 130.1
Triglycerides: 179 mg/dL — ABNORMAL HIGH (ref 0.0–149.0)
VLDL: 35.8 mg/dL (ref 0.0–40.0)

## 2014-05-11 MED ORDER — TRAZODONE HCL 50 MG PO TABS
50.0000 mg | ORAL_TABLET | Freq: Every day | ORAL | Status: DC
Start: 2014-05-11 — End: 2014-05-11

## 2014-05-11 MED ORDER — ZOLPIDEM TARTRATE 10 MG PO TABS: 5.0000 mg | ORAL_TABLET | Freq: Every evening | ORAL | Status: DC | PRN

## 2014-05-11 NOTE — Patient Instructions (Signed)
Get your blood work before you leave     Please come back to the office in 6 months  for a routine check up  No  fasting       Fall Prevention and Home Safety Falls cause injuries and can affect all age groups. It is possible to use preventive measures to significantly decrease the likelihood of falls. There are many simple measures which can make your home safer and prevent falls. OUTDOORS  Repair cracks and edges of walkways and driveways.  Remove high doorway thresholds.  Trim shrubbery on the main path into your home.  Have good outside lighting.  Clear walkways of tools, rocks, debris, and clutter.  Check that handrails are not broken and are securely fastened. Both sides of steps should have handrails.  Have leaves, snow, and ice cleared regularly.  Use sand or salt on walkways during winter months.  In the garage, clean up grease or oil spills. BATHROOM  Install night lights.  Install grab bars by the toilet and in the tub and shower.  Use non-skid mats or decals in the tub or shower.  Place a plastic non-slip stool in the shower to sit on, if needed.  Keep floors dry and clean up all water on the floor immediately.  Remove soap buildup in the tub or shower on a regular basis.  Secure bath mats with non-slip, double-sided rug tape.  Remove throw rugs and tripping hazards from the floors. BEDROOMS  Install night lights.  Make sure a bedside light is easy to reach.  Do not use oversized bedding.  Keep a telephone by your bedside.  Have a firm chair with side arms to use for getting dressed.  Remove throw rugs and tripping hazards from the floor. KITCHEN  Keep handles on pots and pans turned toward the center of the stove. Use back burners when possible.  Clean up spills quickly and allow time for drying.  Avoid walking on wet floors.  Avoid hot utensils and knives.  Position shelves so they are not too high or low.  Place commonly used  objects within easy reach.  If necessary, use a sturdy step stool with a grab bar when reaching.  Keep electrical cables out of the way.  Do not use floor polish or wax that makes floors slippery. If you must use wax, use non-skid floor wax.  Remove throw rugs and tripping hazards from the floor. STAIRWAYS  Never leave objects on stairs.  Place handrails on both sides of stairways and use them. Fix any loose handrails. Make sure handrails on both sides of the stairways are as long as the stairs.  Check carpeting to make sure it is firmly attached along stairs. Make repairs to worn or loose carpet promptly.  Avoid placing throw rugs at the top or bottom of stairways, or properly secure the rug with carpet tape to prevent slippage. Get rid of throw rugs, if possible.  Have an electrician put in a light switch at the top and bottom of the stairs. OTHER FALL PREVENTION TIPS  Wear low-heel or rubber-soled shoes that are supportive and fit well. Wear closed toe shoes.  When using a stepladder, make sure it is fully opened and both spreaders are firmly locked. Do not climb a closed stepladder.  Add color or contrast paint or tape to grab bars and handrails in your home. Place contrasting color strips on first and last steps.  Learn and use mobility aids as needed. Install an electrical   emergency response system.  Turn on lights to avoid dark areas. Replace light bulbs that burn out immediately. Get light switches that glow.  Arrange furniture to create clear pathways. Keep furniture in the same place.  Firmly attach carpet with non-skid or double-sided tape.  Eliminate uneven floor surfaces.  Select a carpet pattern that does not visually hide the edge of steps.  Be aware of all pets. OTHER HOME SAFETY TIPS  Set the water temperature for 120 F (48.8 C).  Keep emergency numbers on or near the telephone.  Keep smoke detectors on every level of the home and near sleeping  areas. Document Released: 02/13/2002 Document Revised: 08/25/2011 Document Reviewed: 05/15/2011 ExitCare Patient Information 2015 [employer]. This information is not intended to replace advice given to you by your health care provider. Make sure you discuss any questions you have with your health care provider.   Preventive Care for Adults Ages 65 and over  Blood pressure check.** / Every 1 to 2 years.  Lipid and cholesterol check.**/ Every 5 years beginning at age 20.  Lung cancer screening. / Every year if you are aged 55-80 years and have a 30-pack-year history of smoking and currently smoke or have quit within the past 15 years. Yearly screening is stopped once you have quit smoking for at least 15 years or develop a health problem that would prevent you from having lung cancer treatment.  Fecal occult blood test (FOBT) of stool. / Every year beginning at age 50 and continuing until age 75. You may not have to do this test if you get a colonoscopy every 10 years.  Flexible sigmoidoscopy** or colonoscopy.** / Every 5 years for a flexible sigmoidoscopy or every 10 years for a colonoscopy beginning at age 50 and continuing until age 75.  Hepatitis C blood test.** / For all people born from 1945 through 1965 and any individual with known risks for hepatitis C.  Abdominal aortic aneurysm (AAA) screening.** / A one-time screening for ages 65 to 75 years who are current or former smokers.  Skin self-exam. / Monthly.  Influenza vaccine. / Every year.  Tetanus, diphtheria, and acellular pertussis (Tdap/Td) vaccine.** / 1 dose of Td every 10 years.  Varicella vaccine.** / Consult your health care provider.  Zoster vaccine.** / 1 dose for adults aged 60 years or older.  Pneumococcal 13-valent conjugate (PCV13) vaccine.** / Consult your health care provider.  Pneumococcal polysaccharide (PPSV23) vaccine.** / 1 dose for all adults aged 65 years and older.  Meningococcal vaccine.** /  Consult your health care provider.  Hepatitis A vaccine.** / Consult your health care provider.  Hepatitis B vaccine.** / Consult your health care provider.  Haemophilus influenzae type b (Hib) vaccine.** / Consult your health care provider. **Family history and personal history of risk and conditions may change your health care provider's recommendations. Document Released: 04/21/2001 Document Revised: 02/28/2013 Document Reviewed: 07/21/2010 ExitCare Patient Information 2015 ExitCare[employer]. This information is not intended to replace advice given to you by your health care provider. Make sure you discuss any questions you have with your health care provider.   

## 2014-05-11 NOTE — Progress Notes (Signed)
Subjective:    Patient ID: Gene [name], male    DOB: December 15, 1940, 74 y.o.   MRN: [id number]  DOS:  05/11/2014 Type of visit - description :   Here for Medicare AWV: 1. Risk factors based on Past M, S, F history: reviewed 2. Physical Activities: sedentary since surgery (foot), gained wt 3. Depression/mood:sx well controlled on med  4. 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 ADL's: Independent  6. Fall Risk: high risk, prevention discussed  7. home Safety: does feelsafe at home  8. Height, weight, &visual acuity: see VS, slt decrease, saw Dr Nile RiggsShapiro yesterday 9. Counseling: provided 10. Labs ordered based on risk factors: if needed  11. Referral Coordination: if needed 12. Care Plan, see assessment and plan ,written instructions provided 13. Cognitive Assessment: motor skills and cognition wnl 14. Care team updated 15. End-of-life care discussed -- already talked to a lawyer for healthcare POA    In addition, we discussed the following Anxiety, well-controlled with Celexa and Xanax Insomnia, not well controlled with current dose of trazodone, increased dose? High cholesterol, good compliance w/ medication no apparent side effects Hypertension, good compliance of medication, ambulatory BPs in the 130s  Review of Systems Constitutional: No fever, chills. No unexplained wt changes. No unusual sweats HEENT: No dental problems, ear discharge, facial swelling, voice changes. No eye discharge, redness or intolerance to light Respiratory: No wheezing or difficulty breathing. No cough , mucus production Cardiovascular: No CP, leg swelling or palpitations GI: no nausea, vomiting, diarrhea or abdominal pain.  No blood in the stools. No dysphagia   Endocrine: No polyphagia, polyuria or polydipsia GU: No dysuria, gross hematuria, difficulty urinating. No urinary urgency or frequency. Musculoskeletal:  Having bilateral hip pain, mild to moderate since he had a partial  foot amputation, thinks symptoms related to the differing gait since surgery (i agree) Skin: No change in the color of the skin, palor or rash Allergic, immunologic: No environmental allergies or food allergies Neurological: No dizziness or syncope. No headaches. No diplopia, slurred speech, motor deficits, facial numbness Hematological: No enlarged lymph nodes, easy bruising or bleeding Psychiatry: No suicidal ideas, hallucinations, behavior problems or confusion. No unusual/severe anxiety or depression.     Past Medical History  Diagnosis Date  . Anxiety and depression 1995    symptoms started aprox 1995, after a divorce  . Hyperlipidemia     high TG  . Hypertension   . Neuropathy   . 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 History of alcohol abuse   . PVD (peripheral vascular disease)     PVD, mild saw Dr Allyson SabalBerry 2012, intolerant to pletal, Rx observation  . Family history of anesthesia complication     "father would start seeing things"  . Heart murmur     as a baby " outgrew"  . 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 Cholecystectomy    . Tonsillectomy    . Amputation  11/20/13 and 11/24/13    toe  . Amputation Left 01/03/2014    Procedure: Left Transmetatarsal Amputation;  Surgeon: Nadara MustardMarcus Duda V, MD;  [facility];  Service: Orthopedics;  Laterality: Left;  . 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 Marital Status: Divorced    Spouse Name: N/A  . Number of Children: 3  . Years of Education:  College   Occupational History  . 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 Smoking status: Never Smoker   . Smokeless tobacco: Never Used  . Alcohol Use: 0.0 oz/week    0 Standard drinks or equivalent per week     Comment: h/o abuse, now on weekends, slt heavier x last few months   . Drug Use: No  .  Sexual Activity: Not on file   Other Topics Concern  . Not on file   Social History Narrative    Divorced since 1994; lives by himself, 3 sons, 1 GD  (all live close by)   [place] veteran       Family History  Problem Relation Age of Onset  . Hyperlipidemia Mother   . Lung cancer Father     asbestos releated  . Colon polyps Father   . Diabetes Neg Hx   . Coronary artery disease Neg Hx   . Prostate cancer Neg Hx        Medication List       This list is accurate as of: 05/11/14 11:59 PM.  Always use your most recent med list.               ALPRAZolam 0.5 MG tablet  Commonly known as:  XANAX  take 1 tablet by mouth three times a day     amLODipine 5 MG tablet  Commonly known as:  NORVASC  Take 1 tablet (5 mg total) by mouth daily.     aspirin EC 81 MG tablet  Take 1 tablet (81 mg total) by mouth daily.     atorvastatin 20 MG tablet  Commonly known as:  LIPITOR  Take 1 tablet by mouth daily.     b complex vitamins capsule  Take 1 capsule by mouth daily.     citalopram 40 MG tablet  Commonly known as:  CELEXA  Take 80 mg by mouth daily.     fenofibrate 160 MG tablet  take 1 tablet by mouth once daily     Krill Oil 1000 MG Caps  Take 1,000 mg by mouth daily.     losartan 100 MG tablet  Commonly known as:  COZAAR  take 1 tablet by mouth once daily     METAMUCIL PO  Take 2 capsules by mouth daily.     pregabalin 150 MG capsule  Commonly known as:  LYRICA  Take 300 mg by mouth daily.     VITAMIN B1-B12 PO  Take 4,000 mg by mouth 2 (two) times daily.     zolpidem 10 MG tablet  Commonly known as:  AMBIEN  Take 0.5-1 tablets (5-10 mg total) by mouth at bedtime as needed for sleep.           Objective:   Physical Exam BP 132/76 mmHg  Pulse 71  Temp(Src) 97.6 F (36.4 C) (Oral)  Ht  (1.753 m)  Wt 212 lb 2 oz (96.219 kg)  BMI 31.31 kg/m2  SpO2 99%  General:   Well developed, well nourished . NAD.  Neck:  Full range of motion.  Supple. No  thyromegaly , normal carotid pulse HEENT:  Normocephalic . Face symmetric, atraumatic Lungs:  CTA B Normal respiratory effort, no intercostal retractions, no accessory muscle use. Heart: RRR,  + II/IV  Murmur more noticeable at the right parasternal area Abdomen:  Not distended, soft, non-tender. No rebound or rigidity. No  mass,organomegaly Muscle skeletal: no pretibial edema bilaterally  Skin: Exposed areas without rash. Not pale. Not jaundice Rectal:  External abnormalities: none. Normal sphincter tone. No rectal masses or tenderness.  Stool brown  Prostate: Prostate gland firm and smooth, no enlargement, nodularity, tenderness, mass, asymmetry or induration.  Neurologic:  alert & oriented X3.  Speech normal, gait appropriate for post surgical state  and unassisted Strength symmetric and appropriate for age.  Psych: Cognition and judgment appear intact.  Cooperative with normal attention span and concentration.  Behavior appropriate. No anxious or depressed appearing.       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

## 2014-05-11 NOTE — Progress Notes (Signed)
Pre visit review using our clinic review tool, if applicable. No additional management support is needed unless otherwise documented below in the visit note. 

## 2014-05-11 NOTE — Assessment & Plan Note (Signed)
Saw cardiology few months ago, currently asymptomatic, plan is to control cardiovascular risk factors, reassess   in 6 months

## 2014-05-11 NOTE — Assessment & Plan Note (Signed)
Drinking in moderation.

## 2014-05-11 NOTE — Assessment & Plan Note (Signed)
Well-controlled, continue with amlodipine and losartan, check a BMP

## 2014-05-11 NOTE — Assessment & Plan Note (Signed)
Good compliance with medications, check FLP 

## 2014-05-11 NOTE — Assessment & Plan Note (Addendum)
Td 08 pneumonia shot 2011 prevnar --- today zostavax ~2012   several Cscopes, see PMH, last 03-2009 next 2016--->  patient said he got letter from [facility] and is going to schedule in fall  DRE done 05/11/2014 negative, psa ordered  labs   diet and exercise discussed

## 2014-05-11 NOTE — Assessment & Plan Note (Addendum)
On trazodone 50 mg half tablet daily, not sleeping well, higher doses of trazodone may interact with citalopram. Plan: Change to Ambien, always try 5 mg, okay to take 10 mg if needed

## 2014-05-11 NOTE — Assessment & Plan Note (Signed)
Systolic heart murmur, better heard at the right parasternal area, aortic sclerosis? Plan: Echocardiogram

## 2014-05-11 NOTE — Assessment & Plan Note (Signed)
Well-controlled, continue citalopram and Xanax

## 2014-05-14 ENCOUNTER — Telehealth: Payer: Self-pay | Admitting: Internal Medicine

## 2014-05-14 NOTE — Telephone Encounter (Signed)
Number to cardiology given

## 2014-05-14 NOTE — Telephone Encounter (Signed)
Caller name: Chrissie Noawilliam Relation to pt: self Call back number: [phone] Pharmacy:  Reason for call:   Patient states that he saw on mychart that an Echo had been scheduled for him. Patient states that nothing about this was mentioned at last visit and wants to know what is going on?

## 2014-05-14 NOTE — Telephone Encounter (Signed)
If I forgot to mention it, my apologies. I did hear a heart murmur when he was here, I think is something benign but   is important to be sure, a echocardiogram will check all his heart valves.

## 2014-05-14 NOTE — Telephone Encounter (Signed)
FYI. Please advise.

## 2014-05-14 NOTE — Telephone Encounter (Signed)
Spoke with Pt informed him of heart murmur. Pt verbalized understanding. But to let cardio know that he will be out of town the week of March 15 when his echo is scheduled, and is requesting to reschedule. Informed Pt I would let of referral coordinators know.

## 2014-05-22 ENCOUNTER — Other Ambulatory Visit (HOSPITAL_COMMUNITY): Payer: Medicare Other

## 2014-06-19 ENCOUNTER — Other Ambulatory Visit: Payer: Self-pay | Admitting: Internal Medicine

## 2014-06-19 NOTE — Telephone Encounter (Signed)
Pt is requesting refill on Alprazolam.  Last OV: 05/11/2014 Last Fill: 04/20/2014 # 90 1RF Pt takes 1 tablet po tid UDS: 01/20/2013 Low risk  Please advise.

## 2014-06-19 NOTE — Telephone Encounter (Signed)
Okay #90 and one refill.  please asked the patient to come back for a UDS at his earliest convenience

## 2014-06-20 NOTE — Telephone Encounter (Signed)
Rx faxed to [facility]. 

## 2014-06-20 NOTE — Telephone Encounter (Signed)
Rx printed, awaiting MD signature.  

## 2014-07-07 ENCOUNTER — Other Ambulatory Visit: Payer: Self-pay | Admitting: Internal Medicine

## 2014-07-12 ENCOUNTER — Encounter: Payer: Self-pay | Admitting: Internal Medicine

## 2014-07-15 ENCOUNTER — Other Ambulatory Visit: Payer: Self-pay | Admitting: Internal Medicine

## 2014-07-16 ENCOUNTER — Encounter: Payer: Self-pay | Admitting: Internal Medicine

## 2014-07-17 ENCOUNTER — Encounter: Payer: Self-pay | Admitting: Internal Medicine

## 2014-07-19 ENCOUNTER — Other Ambulatory Visit: Payer: Self-pay

## 2014-07-20 ENCOUNTER — Other Ambulatory Visit: Payer: Self-pay

## 2014-07-20 ENCOUNTER — Encounter: Payer: Self-pay | Admitting: Internal Medicine

## 2014-07-20 ENCOUNTER — Other Ambulatory Visit: Payer: Self-pay | Admitting: Internal Medicine

## 2014-07-20 ENCOUNTER — Ambulatory Visit (INDEPENDENT_AMBULATORY_CARE_PROVIDER_SITE_OTHER): Payer: Medicare Other | Admitting: Internal Medicine

## 2014-07-20 VITALS — BP 132/84 | HR 74 | Temp 97.7°F | Ht 69.0 in | Wt 204.1 lb

## 2014-07-20 DIAGNOSIS — E785 Hyperlipidemia, unspecified: Secondary | ICD-10-CM | POA: Diagnosis not present

## 2014-07-20 DIAGNOSIS — R7309 Other abnormal glucose: Secondary | ICD-10-CM

## 2014-07-20 DIAGNOSIS — F329 Major depressive disorder, single episode, unspecified: Secondary | ICD-10-CM

## 2014-07-20 DIAGNOSIS — R011 Cardiac murmur, unspecified: Secondary | ICD-10-CM

## 2014-07-20 DIAGNOSIS — I10 Essential (primary) hypertension: Secondary | ICD-10-CM

## 2014-07-20 DIAGNOSIS — F419 Anxiety disorder, unspecified: Secondary | ICD-10-CM

## 2014-07-20 DIAGNOSIS — F418 Other specified anxiety disorders: Secondary | ICD-10-CM

## 2014-07-20 DIAGNOSIS — R7303 Prediabetes: Secondary | ICD-10-CM

## 2014-07-20 LAB — LIPID PANEL
Cholesterol: 164 mg/dL (ref 0–200)
HDL: 43 mg/dL (ref >=40)
LDL Cholesterol: 95 mg/dL (ref 0–99)
Total CHOL/HDL Ratio: 3.8 Ratio
Triglycerides: 130 mg/dL (ref <150)
VLDL: 26 mg/dL (ref 0–40)

## 2014-07-20 LAB — COMPREHENSIVE METABOLIC PANEL
ALBUMIN: 4.4 g/dL (ref 3.5–5.2)
ALT: 13 U/L (ref 0–53)
AST: 18 U/L (ref 0–37)
Alkaline Phosphatase: 33 U/L — ABNORMAL LOW (ref 39–117)
BILIRUBIN TOTAL: 1.1 mg/dL (ref 0.2–1.2)
BUN: 29 mg/dL — ABNORMAL HIGH (ref 6–23)
CO2: 23 meq/L (ref 19–32)
Calcium: 9.7 mg/dL (ref 8.4–10.5)
Chloride: 104 mEq/L (ref 96–112)
Creat: 1.18 mg/dL (ref 0.50–1.35)
GLUCOSE: 81 mg/dL (ref 70–99)
POTASSIUM: 4.3 meq/L (ref 3.5–5.3)
Sodium: 139 mEq/L (ref 135–145)
Total Protein: 7.1 g/dL (ref 6.0–8.3)

## 2014-07-20 NOTE — Assessment & Plan Note (Signed)
Reschedule echocardiogram

## 2014-07-20 NOTE — Progress Notes (Signed)
Pre visit review using our clinic review tool, if applicable. No additional management support is needed unless otherwise documented below in the visit note. 

## 2014-07-20 NOTE — Assessment & Plan Note (Signed)
Symptoms well-controlled, check a UDS

## 2014-07-20 NOTE — Patient Instructions (Signed)
Get your blood work before you leave   Also need a UDS  Come back to the office in 3-4 months -  for a routine check up

## 2014-07-20 NOTE — Assessment & Plan Note (Signed)
Patient concern about a diagnosis of diabetes, his A1c has never been more than 5.8, if anything he is at the prediabetic range (5.7 to 6.4). He already change his diet habits, stop drinking beer and is losing weight. Plan: Continue with healthy lifestyle, check A1c.

## 2014-07-20 NOTE — Progress Notes (Signed)
Subjective:    Patient ID: Gene [name], male    DOB: 1940/05/01, 74 y.o.   MRN: [id number]  DOS:  07/20/2014 Type of visit - description : To discuss labs Interval history: Recently saw orthopedic surgery, he has now a infection on the right foot, at risk of loosing his fourth and fifth toes according to the patient. His surgeon told him he is diabetic, would like further testing including a CMP and A1c.  Good compliance with all medications including Lipitor. Heart murmur, echo was not done.  Review of Systems   Denies anxiety and depression at this point, well-controlled with medications.  Past Medical History  Diagnosis Date  . Anxiety and depression 1995    symptoms started aprox 1995, after a divorce  . Hyperlipidemia     high TG  . Hypertension   . Neuropathy   . 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 History of alcohol abuse   . PVD (peripheral vascular disease)     PVD, mild saw Dr Allyson SabalBerry 2012, intolerant to pletal, Rx observation  . Family history of anesthesia complication     "father would start seeing things"  . Heart murmur     as a baby " outgrew"  . 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 Cholecystectomy    . Tonsillectomy    . Amputation  11/20/13 and 11/24/13    toe  . Amputation Left 01/03/2014    Procedure: Left Transmetatarsal Amputation;  Surgeon: Nadara MustardMarcus Duda V, MD;  [facility];  Service: Orthopedics;  Laterality: Left;  . 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 Marital Status: Divorced    Spouse Name: N/A  . Number of Children: 3  . Years of Education: College   Occupational History  . 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 Smoking status: Never Smoker   . Smokeless tobacco: Never Used  . Alcohol Use: 0.0 oz/week    0 Standard drinks or equivalent per week       Comment: h/o abuse, now on weekends, slt heavier x last few months   . Drug Use: No  . Sexual Activity: Not on file   Other Topics Concern  . Not on file   Social History Narrative    Divorced since 1994; lives by himself, 3 sons, 1 GD  (all live close by)   [place]          Medication List       This list is accurate as of: 07/20/14 11:59 PM.  Always use your most recent med list.               Alpha-Lipoic Acid 600 MG Caps  Take 1 capsule by mouth daily.     ALPRAZolam 0.5 MG tablet  Commonly known as:  XANAX  Take 1 tablet (0.5 mg total) by mouth 3 (three) times daily.     amLODipine 5 MG tablet  Commonly known as:  NORVASC  Take 1 tablet (5 mg total) by mouth daily.     aspirin EC 81 MG tablet  Take 1 tablet (81 mg total) by mouth daily.     atorvastatin 20 MG tablet  Commonly known as:  LIPITOR  Take 1 tablet (20 mg total)  by mouth at bedtime.     b complex vitamins capsule  Take 1 capsule by mouth daily.     citalopram 40 MG tablet  Commonly known as:  CELEXA  Take 80 mg by mouth daily.     doxycycline 100 MG tablet  Commonly known as:  VIBRA-TABS  Take 100 mg by mouth 2 (two) times daily.     fenofibrate 160 MG tablet  take 1 tablet by mouth once daily     Krill Oil 1000 MG Caps  Take 1,000 mg by mouth daily.     losartan 100 MG tablet  Commonly known as:  COZAAR  Take 1 tablet (100 mg total) by mouth daily.     METAMUCIL PO  Take 2 capsules by mouth daily.     pregabalin 150 MG capsule  Commonly known as:  LYRICA  Take 300 mg by mouth daily.     traZODone 50 MG tablet  Commonly known as:  DESYREL  Take 25 mg by mouth at bedtime.     VITAMIN B1-B12 PO  Take 2,000 mg by mouth 2 (two) times daily.     zolpidem 10 MG tablet  Commonly known as:  AMBIEN  Take 0.5-1 tablets (5-10 mg total) by mouth at bedtime as needed for sleep.           Objective:   Physical Exam BP 132/84 mmHg  Pulse 74  Temp(Src) 97.7 F (36.5  C) (Oral)  Ht 5\' 9"  (1.753 m)  Wt 204 lb 2 oz (92.59 kg)  BMI 30.13 kg/m2  SpO2 98%  General:   Well developed, well nourished . NAD.  HEENT:  Normocephalic . Face symmetric, atraumatic Lungs:  CTA B Normal respiratory effort, no intercostal retractions, no accessory muscle use. Heart: RRR,  + syst  murmur.  No pretibial edema bilaterally  Skin: Not pale. Not jaundice Neurologic:  alert & oriented X3.  Speech normal, gait appropriate for age and unassisted Psych--  Cognition and judgment appear intact.  Cooperative with normal attention span and concentration.  Behavior appropriate. No anxious or depressed appearing.       Assessment & Plan:

## 2014-07-20 NOTE — Assessment & Plan Note (Addendum)
Last FLP show the LDL was not at goal, he is now taking Lipitor consistently. Check labs

## 2014-07-21 LAB — HEMOGLOBIN A1C
Hgb A1c MFr Bld: 5.7 % — ABNORMAL HIGH (ref <5.7)
MEAN PLASMA GLUCOSE: 117 mg/dL — AB (ref <117)

## 2014-07-23 ENCOUNTER — Ambulatory Visit (INDEPENDENT_AMBULATORY_CARE_PROVIDER_SITE_OTHER): Payer: Medicare Other

## 2014-07-23 ENCOUNTER — Encounter: Payer: Self-pay | Admitting: Internal Medicine

## 2014-07-23 ENCOUNTER — Ambulatory Visit (INDEPENDENT_AMBULATORY_CARE_PROVIDER_SITE_OTHER): Payer: Medicare Other | Admitting: Podiatry

## 2014-07-23 ENCOUNTER — Other Ambulatory Visit (HOSPITAL_COMMUNITY): Payer: Self-pay | Admitting: Orthopedic Surgery

## 2014-07-23 ENCOUNTER — Encounter: Payer: Self-pay | Admitting: Podiatry

## 2014-07-23 VITALS — BP 125/60 | HR 77 | Temp 97.7°F | Resp 18

## 2014-07-23 DIAGNOSIS — R52 Pain, unspecified: Secondary | ICD-10-CM

## 2014-07-23 DIAGNOSIS — L97522 Non-pressure chronic ulcer of other part of left foot with fat layer exposed: Secondary | ICD-10-CM

## 2014-07-23 DIAGNOSIS — M869 Osteomyelitis, unspecified: Secondary | ICD-10-CM

## 2014-07-23 DIAGNOSIS — S99912A Unspecified injury of left ankle, initial encounter: Secondary | ICD-10-CM

## 2014-07-23 MED ORDER — ATORVASTATIN CALCIUM 40 MG PO TABS: 40.0000 mg | ORAL_TABLET | Freq: Every day | ORAL | Status: DC

## 2014-07-23 NOTE — Addendum Note (Signed)
Addended by: Christin FudgeFAULKNER, Vera Wishart C on: 07/23/2014 10:30 AM   Modules accepted: Orders, Medications

## 2014-07-25 ENCOUNTER — Encounter: Payer: Self-pay | Admitting: Internal Medicine

## 2014-07-25 NOTE — Progress Notes (Signed)
Patient ID: Gene [name], male   DOB: [birth year], 74 y.o.   MRN: [id number]  Subjective: 74 year old male presents the office today for evaluation of right foot ulceration. He states that he has been seen Dr. Lajoyce Cornersuda after undergoing a left transmetatarsal amputation. He called me last week stating there was a wound on his second toe with some slightly macerated appearing skin. I recommended follow-up with Dr. Lajoyce Cornersuda as he is been treating him. He did see Dr. Lajoyce Cornersuda this morning and due to an ulceration on the bottom of his foot and fourth toe continued infection has recommended amputation. He's been continuing daily dressing changes to the wound on his right second toe and the bottom of the fourth toe. He previously states he infection to the second toe which resulted all taking doxycycline. The infection of the fourth toe was not resolving.  Also while the patient was leaving today he did state that he did fall this morning injuring his left ankle and would like to have this looked at. Denies any pain associated with the ankle or any significant swelling or redness. No other complaints at this time.  Objective: AAO 3, NAD DP/PT pulses palpable although decreased Protective sensation decreased with Simms Weinstein monofilament On the left side status post transmetatarsal amputation. Incision is healed. There is a small scab overlying the incision on the lateral aspect. There is no surrounding edema, erythema, drainage or other clinical signs of infection. On the right foot there is a superficial granular ulceration on the medial aspect of the second digit without any overlying edema, erythema, increase in warmth to the digit. There is lateral deviation of the hallux abutting the second digit likely causing this ulceration. There is angular ulceration right foot submetatarsal 4 with a fibrotic base which probes very close to bone. The fourth digit is edematous as well as erythematous. There is no areas of  fluctuance or crepitus. There is no ascending cellulitis. On the left ankle there is no areas of pinpoint bony tenderness or pain the vibratory sensation. No pain with motion. No overlying edema, erythema, increased warmth. No other areas of tenderness to bilateral lower extremities. No pain with calf compression, swelling, warmth, erythema.  Assessment: 74 year old male with right foot infection; left ankle injury  Plan: -X-rays were obtained a left ankle and right foot and reviewed with the patient. -I had a very long discussion with the patient in regards to the etiology of his ulcerations. We had multiple long conversations about this as well. He is convinced that he is diabetic causing these ulcerations. I discussed with him that given his neuropathy as well as peripheral vascular disease these are likely contributing events to his ulcerations and nonhealing. -I do believe that fourth digit amputation on the right foot is warranted at this time due to continued infection.  -Discussed with him that concern for further amputation of the other digits. The right foot has a similar appearance of the left foot had when I was treating him initially. -Continue daily dressing changes and follow up with Dr. Lajoyce Cornersuda. -Regards to left ankle x-rays were obtained no fractures identified. There is no edema or erythema to the ankle. We'll continue to monitor. If symptoms worsen to call the office. -Follow-up with me as necessary. Call the office with any questions, concerns, changes symptoms.

## 2014-07-26 ENCOUNTER — Encounter (HOSPITAL_COMMUNITY): Payer: Self-pay | Admitting: *Deleted

## 2014-07-27 ENCOUNTER — Ambulatory Visit (HOSPITAL_COMMUNITY)
Admission: RE | Admit: 2014-07-27 | Discharge: 2014-07-27 | Disposition: A | Discharge status: Home / Self Care | Payer: Medicare Other | Source: Ambulatory Visit | Attending: Orthopedic Surgery | Admitting: Orthopedic Surgery

## 2014-07-27 ENCOUNTER — Ambulatory Visit (HOSPITAL_COMMUNITY): Payer: Medicare Other | Admitting: Certified Registered"

## 2014-07-27 ENCOUNTER — Encounter (HOSPITAL_COMMUNITY): Payer: Self-pay | Admitting: *Deleted

## 2014-07-27 ENCOUNTER — Encounter (HOSPITAL_COMMUNITY)
Admission: RE | Disposition: A | Discharge status: Home / Self Care | Payer: Self-pay | Source: Ambulatory Visit | Attending: Orthopedic Surgery

## 2014-07-27 DIAGNOSIS — M868X7 Other osteomyelitis, ankle and foot: Secondary | ICD-10-CM | POA: Diagnosis present

## 2014-07-27 DIAGNOSIS — M1990 Unspecified osteoarthritis, unspecified site: Secondary | ICD-10-CM | POA: Diagnosis not present

## 2014-07-27 DIAGNOSIS — M86171 Other acute osteomyelitis, right ankle and foot: Secondary | ICD-10-CM

## 2014-07-27 DIAGNOSIS — I10 Essential (primary) hypertension: Secondary | ICD-10-CM | POA: Diagnosis not present

## 2014-07-27 DIAGNOSIS — I739 Peripheral vascular disease, unspecified: Secondary | ICD-10-CM | POA: Diagnosis not present

## 2014-07-27 HISTORY — DX: Unspecified osteoarthritis, unspecified site: M19.90

## 2014-07-27 HISTORY — PX: AMPUTATION: SHX166

## 2014-07-27 HISTORY — DX: Anxiety disorder, unspecified: F41.9

## 2014-07-27 LAB — CBC
HCT: 41 % (ref 39.0–52.0)
Hemoglobin: 13.9 g/dL (ref 13.0–17.0)
MCH: 31.2 pg (ref 26.0–34.0)
MCHC: 33.9 g/dL (ref 30.0–36.0)
MCV: 92.1 fL (ref 78.0–100.0)
PLATELETS: 308 10*3/uL (ref 150–400)
RBC: 4.45 MIL/uL (ref 4.22–5.81)
RDW: 12.7 % (ref 11.5–15.5)
WBC: 6.8 10*3/uL (ref 4.0–10.5)

## 2014-07-27 LAB — APTT: aPTT: 34 seconds (ref 24–37)

## 2014-07-27 LAB — PROTIME-INR
INR: 1.29 (ref 0.00–1.49)
Prothrombin Time: 16.2 seconds — ABNORMAL HIGH (ref 11.6–15.2)

## 2014-07-27 SURGERY — AMPUTATION, FOOT, RAY
Anesthesia: General | Site: Foot | Laterality: Right

## 2014-07-27 MED ORDER — 0.9 % SODIUM CHLORIDE (POUR BTL) OPTIME
TOPICAL | Status: DC | PRN
  Administered 2014-07-27: 1000 mL

## 2014-07-27 MED ORDER — PHENYLEPHRINE HCL 10 MG/ML IJ SOLN
INTRAMUSCULAR | Status: DC | PRN
  Administered 2014-07-27 (×2): 80 ug via INTRAVENOUS

## 2014-07-27 MED ORDER — FENTANYL CITRATE (PF) 100 MCG/2ML IJ SOLN
INTRAMUSCULAR | Status: DC | PRN
  Administered 2014-07-27: 50 ug via INTRAVENOUS

## 2014-07-27 MED ORDER — MIDAZOLAM HCL 2 MG/2ML IJ SOLN
INTRAMUSCULAR | Status: AC
Start: 2014-07-27 — End: 2014-07-27
  Filled 2014-07-27: qty 2

## 2014-07-27 MED ORDER — CEFAZOLIN SODIUM-DEXTROSE 2-3 GM-% IV SOLR: 2.0000 g | INTRAVENOUS | Status: DC

## 2014-07-27 MED ORDER — DOXYCYCLINE HYCLATE 50 MG PO CAPS: 100.0000 mg | ORAL_CAPSULE | Freq: Two times a day (BID) | ORAL | Status: DC

## 2014-07-27 MED ORDER — LIDOCAINE HCL (CARDIAC) 20 MG/ML IV SOLN
INTRAVENOUS | Status: AC
  Filled 2014-07-27: qty 5

## 2014-07-27 MED ORDER — PROPOFOL 10 MG/ML IV BOLUS
INTRAVENOUS | Status: DC | PRN
  Administered 2014-07-27: 200 mg via INTRAVENOUS

## 2014-07-27 MED ORDER — LIDOCAINE HCL (CARDIAC) 20 MG/ML IV SOLN
INTRAVENOUS | Status: DC | PRN
  Administered 2014-07-27: 80 mg via INTRAVENOUS

## 2014-07-27 MED ORDER — MIDAZOLAM HCL 5 MG/5ML IJ SOLN
INTRAMUSCULAR | Status: DC | PRN
  Administered 2014-07-27: 2 mg via INTRAVENOUS

## 2014-07-27 MED ORDER — LIDOCAINE HCL (CARDIAC) 20 MG/ML IV SOLN
INTRAVENOUS | Status: AC
  Filled 2014-07-27: qty 10

## 2014-07-27 MED ORDER — LACTATED RINGERS IV SOLN
INTRAVENOUS | Status: DC
  Administered 2014-07-27: 50 mL/h via INTRAVENOUS

## 2014-07-27 MED ORDER — CEFAZOLIN SODIUM-DEXTROSE 2-3 GM-% IV SOLR
INTRAVENOUS | Status: AC
  Administered 2014-07-27: 2 g via INTRAVENOUS
  Filled 2014-07-27: qty 50

## 2014-07-27 MED ORDER — FENTANYL CITRATE (PF) 250 MCG/5ML IJ SOLN
INTRAMUSCULAR | Status: AC
Start: 2014-07-27 — End: 2014-07-27
  Filled 2014-07-27: qty 5

## 2014-07-27 MED ORDER — ONDANSETRON HCL 4 MG/2ML IJ SOLN
INTRAMUSCULAR | Status: DC | PRN
  Administered 2014-07-27: 4 mg via INTRAVENOUS

## 2014-07-27 MED ORDER — HYDROCODONE-ACETAMINOPHEN 5-325 MG PO TABS: 1.0000 | ORAL_TABLET | Freq: Four times a day (QID) | ORAL | Status: DC | PRN

## 2014-07-27 MED ORDER — PHENYLEPHRINE 40 MCG/ML (10ML) SYRINGE FOR IV PUSH (FOR BLOOD PRESSURE SUPPORT)
PREFILLED_SYRINGE | INTRAVENOUS | Status: AC
Start: 2014-07-27 — End: 2014-07-27
  Filled 2014-07-27: qty 10

## 2014-07-27 SURGICAL SUPPLY — 31 items
BLADE SAW SGTL MED 73X18.5 STR (BLADE) IMPLANT
BNDG COHESIVE 4X5 TAN STRL (GAUZE/BANDAGES/DRESSINGS) ×3 IMPLANT
BNDG GAUZE ELAST 4 BULKY (GAUZE/BANDAGES/DRESSINGS) ×3 IMPLANT
COVER SURGICAL LIGHT HANDLE (MISCELLANEOUS) ×6 IMPLANT
DRAPE U-SHAPE 47X51 STRL (DRAPES) ×6 IMPLANT
DRSG ADAPTIC 3X8 NADH LF (GAUZE/BANDAGES/DRESSINGS) ×3 IMPLANT
DRSG PAD ABDOMINAL 8X10 ST (GAUZE/BANDAGES/DRESSINGS) ×6 IMPLANT
DURAPREP 26ML APPLICATOR (WOUND CARE) ×3 IMPLANT
ELECT REM PT RETURN 9FT ADLT (ELECTROSURGICAL) ×3
ELECTRODE REM PT RTRN 9FT ADLT (ELECTROSURGICAL) ×1 IMPLANT
GAUZE SPONGE 4X4 12PLY STRL (GAUZE/BANDAGES/DRESSINGS) ×3 IMPLANT
GLOVE BIOGEL PI IND STRL 9 (GLOVE) ×1 IMPLANT
GLOVE BIOGEL PI INDICATOR 9 (GLOVE) ×2
GLOVE SURG ORTHO 9.0 STRL STRW (GLOVE) ×3 IMPLANT
GOWN STRL REUS W/ TWL LRG LVL3 (GOWN DISPOSABLE) ×1 IMPLANT
GOWN STRL REUS W/ TWL XL LVL3 (GOWN DISPOSABLE) ×2 IMPLANT
GOWN STRL REUS W/TWL LRG LVL3 (GOWN DISPOSABLE) ×2
GOWN STRL REUS W/TWL XL LVL3 (GOWN DISPOSABLE) ×4
KIT BASIN OR (CUSTOM PROCEDURE TRAY) ×3 IMPLANT
KIT ROOM TURNOVER OR (KITS) ×3 IMPLANT
NS IRRIG 1000ML POUR BTL (IV SOLUTION) ×3 IMPLANT
PACK ORTHO EXTREMITY (CUSTOM PROCEDURE TRAY) ×3 IMPLANT
PAD ARMBOARD 7.5X6 YLW CONV (MISCELLANEOUS) ×6 IMPLANT
SPONGE GAUZE 4X4 12PLY STER LF (GAUZE/BANDAGES/DRESSINGS) ×3 IMPLANT
SPONGE LAP 18X18 X RAY DECT (DISPOSABLE) ×3 IMPLANT
STOCKINETTE IMPERVIOUS LG (DRAPES) IMPLANT
SUT ETHILON 2 0 PSLX (SUTURE) ×6 IMPLANT
TOWEL OR 17X24 6PK STRL BLUE (TOWEL DISPOSABLE) ×3 IMPLANT
TOWEL OR 17X26 10 PK STRL BLUE (TOWEL DISPOSABLE) ×3 IMPLANT
UNDERPAD 30X30 INCONTINENT (UNDERPADS AND DIAPERS) ×3 IMPLANT
WATER STERILE IRR 1000ML POUR (IV SOLUTION) ×3 IMPLANT

## 2014-07-27 NOTE — Anesthesia Procedure Notes (Signed)
Procedure Name: LMA Insertion Date/Time: 07/27/2014 1:35 PM Performed by: Jerilee HohMUMM, Shar Paez N Pre-anesthesia Checklist: Patient identified, Emergency Drugs available, Suction available and Patient being monitored Patient Re-evaluated:Patient Re-evaluated prior to inductionOxygen Delivery Method: Circle system utilized Preoxygenation: Pre-oxygenation with 100% oxygen Intubation Type: IV induction Ventilation: Mask ventilation without difficulty LMA: LMA inserted LMA Size: 4.0 Tube type: Oral Number of attempts: 1 Placement Confirmation: positive ETCO2 and breath sounds checked- equal and bilateral Tube secured with: Tape Dental Injury: Teeth and Oropharynx as per pre-operative assessment

## 2014-07-27 NOTE — Anesthesia Preprocedure Evaluation (Signed)
Anesthesia Evaluation  Patient identified by MRN, date of birth, ID band Patient awake    Reviewed: Allergy & Precautions, H&P , NPO status , Patient's Chart, lab work & pertinent test results  Airway Mallampati: II   Neck ROM: Full    Dental  (+) Teeth Intact, Dental Advisory Given   Pulmonary neg pulmonary ROS,  breath sounds clear to auscultation        Cardiovascular hypertension, Pt. on medications + Peripheral Vascular Disease + Valvular Problems/Murmurs Rhythm:Regular     Neuro/Psych PSYCHIATRIC DISORDERS Anxiety Depression    GI/Hepatic negative GI ROS, Neg liver ROS,   Endo/Other  negative endocrine ROS  Renal/GU negative Renal ROS     Musculoskeletal  (+) Arthritis -,   Abdominal (+)  Abdomen: soft.    Peds  Hematology negative hematology ROS (+)   Anesthesia Other Findings   Reproductive/Obstetrics negative OB ROS                             Anesthesia Physical  Anesthesia Plan  ASA: III  Anesthesia Plan:    Post-op Pain Management:    Induction: Intravenous  Airway Management Planned:   Additional Equipment:   Intra-op Plan:   Post-operative Plan: Extubation in OR  Informed Consent: I have reviewed the patients History and Physical, chart, labs and discussed the procedure including the risks, benefits and alternatives for the proposed anesthesia with the patient or authorized representative who has indicated his/her understanding and acceptance.   Dental advisory given  Plan Discussed with: CRNA  Anesthesia Plan Comments: (Check today labs, PVD, no smoke, no DM, HTN, )        Anesthesia Quick Evaluation

## 2014-07-27 NOTE — Progress Notes (Signed)
Belonging returned to pt . Cell phone and eyeglasses included

## 2014-07-27 NOTE — Transfer of Care (Signed)
Immediate Anesthesia Transfer of Care Note  Patient: Gene [name]  Procedure(s) Performed: Procedure(s): Right 4th Ray Amputation (Right)  Patient Location: PACU  Anesthesia Type:General  Level of Consciousness: awake, alert , oriented and patient cooperative  Airway & Oxygen Therapy: Patient Spontanous Breathing and Patient connected to nasal cannula oxygen  Post-op Assessment: Report given to RN, Post -op Vital signs reviewed and stable and Patient moving all extremities  Post vital signs: Reviewed and stable  Last Vitals:  Filed Vitals:   07/27/14 1046  BP: 121/89  Pulse: 77  Temp: 36.4 C  Resp: 20    Complications: No apparent anesthesia complications

## 2014-07-27 NOTE — Progress Notes (Signed)
Notified Dr. Renold DonGermeroth of patient HR 70s- 137 , patient asymptomatic (pulse felt irreg.). When hooked to monitor it showed NSR and EKG showed NSR.

## 2014-07-27 NOTE — Anesthesia Postprocedure Evaluation (Signed)
Anesthesia Post Note  Patient: Gene FretWilliam [name]  Procedure(s) Performed: Procedure(s) (LRB): Right 4th Ray Amputation (Right)  Anesthesia type: general  Patient location: PACU  Post pain: Pain level controlled  Post assessment: Patient's Cardiovascular Status Stable  Last Vitals:  Filed Vitals:   07/27/14 1445  BP: 136/82  Pulse: 73  Temp: 36.1 C  Resp: 14    Post vital signs: Reviewed and stable  Level of consciousness: sedated  Complications: No apparent anesthesia complications

## 2014-07-27 NOTE — Progress Notes (Signed)
Orthopedic Tech Progress Note Patient Details:  Gene FretWilliam [name] 02-06-1941 [id number]  Ortho Devices Type of Ortho Device: Postop shoe/boot Ortho Device/Splint Location: rle Ortho Device/Splint Interventions: Application Viewed order from doctor's order list  Nikki DomCrawford, Lanise Mergen 07/27/2014, 2:25 PM

## 2014-07-27 NOTE — Op Note (Signed)
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    The risks benefits and alternatives were discussed with the patient preoperatively including but not limited to the risks of infection, bleeding, nerve injury, cardiopulmonary complications, the need for revision surgery, among others, and the patient was willing to proceed.  OPERATIVE IMPLANTS: None  OPERATIVE FINDINGS: Abscess MTP joint right fourth toe  OPERATIVE PROCEDURE: Patient was brought to the operating room and underwent a general anesthetic. After adequate levels of anesthesia were obtained patient's right lower extremity was prepped using DuraPrep draped into a sterile field. A timeout was called. An elliptical incision was made around the toe to include the ulcer and the toe and the metatarsal. The metatarsal was resected mid shaft. There was an abscess involving the  MTP joint. After debridement of the tissue remaining was healthy and viable and not involved with the infection. Gloves and instruments were changed. The incision was closed using 2-0 nylon. A sterile compressive dressing was applied. Patient was extubated taken to the PACU in stable condition.

## 2014-07-27 NOTE — H&P (Signed)
Gene [name] is an 74 y.o. male.   Chief Complaint: Osteomyelitis and abscess right foot fourth toe HPI: Patient is a 74 year old gentleman with peripheral vascular disease who is status post a left transmetatarsal amputation presents at this time with ostium myelitis abscess ulceration right foot fourth toe  Past Medical History  Diagnosis Date  . Anxiety and depression 1995    symptoms started aprox 1995, after a divorce  . Hyperlipidemia     high TG  . Hypertension   . Neuropathy   . 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 History of alcohol abuse   . PVD (peripheral vascular disease)     PVD, mild saw Dr Allyson SabalBerry 2012, intolerant to pletal, Rx observation  . Heart murmur     as a baby " outgrew"  . 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 Family history of anesthesia complication     "father would start seeing things"  . Anxiety   . Arthritis     Past Surgical History  Procedure Laterality Date  . Cholecystectomy    . Tonsillectomy    . Amputation Left 11/20/13 and 11/24/13    middle  . Amputation Left 01/03/2014    Procedure: Left Transmetatarsal Amputation;  Surgeon: Nadara MustardMarcus Deaunte Dente V, MD;  [facility];  Service: Orthopedics;  Laterality: Left;  . Transmetatarsal amputation Left 10/2013  . Colonoscopy      Family History  Problem Relation Age of Onset  . Hyperlipidemia Mother   . Lung cancer Father     asbestos releated  . Colon polyps Father   . Diabetes Neg Hx   . Coronary artery disease Neg Hx   . Prostate cancer Neg Hx    Social History:  reports that he has never smoked. He has never used smokeless tobacco. He reports that he drinks about 2.4 oz of alcohol per week. He reports that he does not use illicit drugs.  Allergies: No Known Allergies  No prescriptions prior to admission    No results found for this or any previous visit (from the past 48 hour(s)). No results found.  Review of Systems  All other systems  reviewed and are negative.   There were no vitals taken for this visit. Physical Exam  On examination patient has swelling ulceration and osteomyelitis right foot fourth toe Assessment/Plan Assessment: Osteomyelitis abscess ulceration right foot fourth toe.  Plan: We'll plan for right foot fourth ray amputation. Risks and benefits of surgery were discussed patient states he understands and wishes to proceed at this time.  Chaske Paskett V 07/27/2014, 6:44 AM

## 2014-07-30 ENCOUNTER — Encounter (HOSPITAL_COMMUNITY): Payer: Self-pay | Admitting: Orthopedic Surgery

## 2014-08-13 ENCOUNTER — Telehealth: Payer: Self-pay | Admitting: Internal Medicine

## 2014-08-13 NOTE — Telephone Encounter (Signed)
Pt needs to call Dr. Lajoyce Cornersuda who did the surgery and prescribed the medication for advice.

## 2014-08-13 NOTE — Telephone Encounter (Signed)
Relation to pt: self Call back number:[phone] Pharmacy: [facility] [place] [address] [phone] (Phone) [phone] (Fax)        Reason for call:   Pt states he just had knee replacement last week and he is currently on doxycycline and Hydrocodone-acetaminophen. Pt is experiencing stomach pain, light headed and dizziness. Pt feels like it could be the medication he is currently on.Please advise

## 2014-08-15 ENCOUNTER — Encounter (HOSPITAL_COMMUNITY): Payer: Self-pay | Admitting: Emergency Medicine

## 2014-08-15 ENCOUNTER — Other Ambulatory Visit: Payer: Self-pay | Admitting: Internal Medicine

## 2014-08-15 ENCOUNTER — Telehealth: Payer: Self-pay | Admitting: Internal Medicine

## 2014-08-15 ENCOUNTER — Inpatient Hospital Stay (HOSPITAL_COMMUNITY)
Admission: EM | Admit: 2014-08-15 | Discharge: 2014-08-17 | DRG: 378 | Disposition: A | Discharge status: Home / Self Care | Payer: Medicare Other | Attending: Internal Medicine | Admitting: Internal Medicine

## 2014-08-15 DIAGNOSIS — I739 Peripheral vascular disease, unspecified: Secondary | ICD-10-CM | POA: Diagnosis present

## 2014-08-15 DIAGNOSIS — G629 Polyneuropathy, unspecified: Secondary | ICD-10-CM | POA: Diagnosis present

## 2014-08-15 DIAGNOSIS — K208 Other esophagitis: Secondary | ICD-10-CM | POA: Diagnosis present

## 2014-08-15 DIAGNOSIS — K921 Melena: Principal | ICD-10-CM

## 2014-08-15 DIAGNOSIS — M1990 Unspecified osteoarthritis, unspecified site: Secondary | ICD-10-CM | POA: Diagnosis present

## 2014-08-15 DIAGNOSIS — Z89421 Acquired absence of other right toe(s): Secondary | ICD-10-CM | POA: Diagnosis not present

## 2014-08-15 DIAGNOSIS — K2210 Ulcer of esophagus without bleeding: Secondary | ICD-10-CM | POA: Diagnosis present

## 2014-08-15 DIAGNOSIS — R531 Weakness: Secondary | ICD-10-CM

## 2014-08-15 DIAGNOSIS — E785 Hyperlipidemia, unspecified: Secondary | ICD-10-CM | POA: Diagnosis present

## 2014-08-15 DIAGNOSIS — F419 Anxiety disorder, unspecified: Secondary | ICD-10-CM | POA: Diagnosis present

## 2014-08-15 DIAGNOSIS — D62 Acute posthemorrhagic anemia: Secondary | ICD-10-CM | POA: Diagnosis present

## 2014-08-15 DIAGNOSIS — Z79899 Other long term (current) drug therapy: Secondary | ICD-10-CM

## 2014-08-15 DIAGNOSIS — I10 Essential (primary) hypertension: Secondary | ICD-10-CM | POA: Diagnosis present

## 2014-08-15 DIAGNOSIS — F329 Major depressive disorder, single episode, unspecified: Secondary | ICD-10-CM | POA: Diagnosis present

## 2014-08-15 DIAGNOSIS — K920 Hematemesis: Secondary | ICD-10-CM | POA: Diagnosis present

## 2014-08-15 DIAGNOSIS — Z7982 Long term (current) use of aspirin: Secondary | ICD-10-CM

## 2014-08-15 LAB — CBC WITH DIFFERENTIAL/PLATELET
Basophils Absolute: 0 10*3/uL (ref 0.0–0.1)
Basophils Relative: 0 % (ref 0–1)
EOS ABS: 0 10*3/uL (ref 0.0–0.7)
EOS PCT: 0 % (ref 0–5)
HCT: 28.3 % — ABNORMAL LOW (ref 39.0–52.0)
Hemoglobin: 9.6 g/dL — ABNORMAL LOW (ref 13.0–17.0)
Lymphocytes Relative: 19 % (ref 12–46)
Lymphs Abs: 1.3 10*3/uL (ref 0.7–4.0)
MCH: 30.4 pg (ref 26.0–34.0)
MCHC: 33.9 g/dL (ref 30.0–36.0)
MCV: 89.6 fL (ref 78.0–100.0)
MONO ABS: 0.7 10*3/uL (ref 0.1–1.0)
Monocytes Relative: 11 % (ref 3–12)
NEUTROS ABS: 4.8 10*3/uL (ref 1.7–7.7)
NEUTROS PCT: 70 % (ref 43–77)
Platelets: 287 10*3/uL (ref 150–400)
RBC: 3.16 MIL/uL — ABNORMAL LOW (ref 4.22–5.81)
RDW: 12.7 % (ref 11.5–15.5)
WBC: 6.9 10*3/uL (ref 4.0–10.5)

## 2014-08-15 LAB — COMPREHENSIVE METABOLIC PANEL
ALT: 18 U/L (ref 17–63)
ANION GAP: 10 (ref 5–15)
AST: 23 U/L (ref 15–41)
Albumin: 2.9 g/dL — ABNORMAL LOW (ref 3.5–5.0)
Alkaline Phosphatase: 32 U/L — ABNORMAL LOW (ref 38–126)
BILIRUBIN TOTAL: 0.5 mg/dL (ref 0.3–1.2)
BUN: 57 mg/dL — AB (ref 6–20)
CO2: 20 mmol/L — ABNORMAL LOW (ref 22–32)
CREATININE: 1.17 mg/dL (ref 0.61–1.24)
Calcium: 8.7 mg/dL — ABNORMAL LOW (ref 8.9–10.3)
Chloride: 105 mmol/L (ref 101–111)
GFR calc Af Amer: >60 mL/min (ref >60)
GFR calc non Af Amer: >60 mL/min (ref >60)
Glucose, Bld: 118 mg/dL — ABNORMAL HIGH (ref 65–99)
Potassium: 3.8 mmol/L (ref 3.5–5.1)
Sodium: 135 mmol/L (ref 135–145)
TOTAL PROTEIN: 5.5 g/dL — AB (ref 6.5–8.1)

## 2014-08-15 LAB — LIPASE, BLOOD: Lipase: 25 U/L (ref 22–51)

## 2014-08-15 LAB — URINALYSIS, ROUTINE W REFLEX MICROSCOPIC
BILIRUBIN URINE: NEGATIVE
Glucose, UA: NEGATIVE mg/dL
Hgb urine dipstick: NEGATIVE
KETONES UR: NEGATIVE mg/dL
Leukocytes, UA: NEGATIVE
NITRITE: NEGATIVE
Protein, ur: NEGATIVE mg/dL
Specific Gravity, Urine: 1.022 (ref 1.005–1.030)
UROBILINOGEN UA: 0.2 mg/dL (ref 0.0–1.0)
pH: 5 (ref 5.0–8.0)

## 2014-08-15 LAB — I-STAT TROPONIN, ED: Troponin i, poc: 0.01 ng/mL (ref 0.00–0.08)

## 2014-08-15 LAB — ABO/RH: ABO/RH(D): O POS

## 2014-08-15 MED ORDER — ONDANSETRON HCL 4 MG/2ML IJ SOLN
4.0000 mg | Freq: Four times a day (QID) | INTRAMUSCULAR | Status: DC | PRN
  Administered 2014-08-16: 4 mg via INTRAVENOUS
  Filled 2014-08-15: qty 2

## 2014-08-15 MED ORDER — PANTOPRAZOLE SODIUM 40 MG IV SOLR
80.0000 mg | Freq: Once | INTRAVENOUS | Status: AC
  Administered 2014-08-16: 80 mg via INTRAVENOUS
  Filled 2014-08-15: qty 80

## 2014-08-15 MED ORDER — SODIUM CHLORIDE 0.9 % IV SOLN
INTRAVENOUS | Status: AC
  Administered 2014-08-16: 02:00:00 via INTRAVENOUS
  Administered 2014-08-16: 500 mL via INTRAVENOUS
  Administered 2014-08-16: 15:00:00 via INTRAVENOUS

## 2014-08-15 MED ORDER — ACETAMINOPHEN 325 MG PO TABS: 650.0000 mg | ORAL_TABLET | Freq: Four times a day (QID) | ORAL | Status: DC | PRN

## 2014-08-15 MED ORDER — ACETAMINOPHEN 650 MG RE SUPP: 650.0000 mg | Freq: Four times a day (QID) | RECTAL | Status: DC | PRN

## 2014-08-15 MED ORDER — PANTOPRAZOLE SODIUM 40 MG IV SOLR: 40.0000 mg | Freq: Two times a day (BID) | INTRAVENOUS | Status: DC

## 2014-08-15 MED ORDER — SODIUM CHLORIDE 0.9 % IV SOLN
8.0000 mg/h | INTRAVENOUS | Status: DC
  Administered 2014-08-16 – 2014-08-17 (×3): 8 mg/h via INTRAVENOUS
  Filled 2014-08-15 (×7): qty 80

## 2014-08-15 MED ORDER — ONDANSETRON HCL 4 MG PO TABS: 4.0000 mg | ORAL_TABLET | Freq: Four times a day (QID) | ORAL | Status: DC | PRN

## 2014-08-15 MED ORDER — DEXTROSE 5 % IV SOLN
100.0000 mg | Freq: Two times a day (BID) | INTRAVENOUS | Status: DC
  Administered 2014-08-16 – 2014-08-17 (×4): 100 mg via INTRAVENOUS
  Filled 2014-08-15 (×6): qty 100

## 2014-08-15 NOTE — H&P (Signed)
[facility] History and Physical  Gene FretWilliam Toya GMW:[id number] DOB: 02-07-1941 DOA: 08/15/2014  Referring physician: Ms.Kailtlyn. PCP: Willow OraJose Paz, MD  Specialists: None.  Chief Complaint: Melena.  HPI: Gene [name] is a 74 y.o. 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 Patient has been noticing melanotic stool over the last few days with epigastric pain. Denies any nausea vomiting. Patient has been feeling dizzy on standing. Patient used to take aspirin for his peripheral vascular disease which he stopped taking 4 days ago after patient became dizzy. In the ER patient's hemoglobin is found to be around 9 which is a drop of 4 on 4 g from his previous 3 weeks ago. On call gastroenterologist Dr. Ewing SchleinMagod has been consulted and patient has been admitted for GI bleed. Rectal exam shows melanotic stool as per the ER physician. Patient also states in addition to Avastin he was taking Aleve for pain relief. Denies any chest pain or shortness of breath.   Review of Systems: As presented in the history of presenting illness, rest negative.  Past Medical History  Diagnosis Date  . Anxiety and depression 1995    symptoms started aprox 1995, after a divorce  . Hyperlipidemia     high TG  . Hypertension   . Neuropathy   . 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 History of alcohol abuse   . PVD (peripheral vascular disease)     PVD, mild saw Dr Allyson SabalBerry 2012, intolerant to pletal, Rx observation  . Heart murmur     as a baby " outgrew"  . 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 Family history of anesthesia complication     "father would start seeing things"  . Anxiety   . Arthritis    Past Surgical History  Procedure Laterality Date  . Cholecystectomy    . Tonsillectomy    . Amputation Left 11/20/13 and 11/24/13     middle  . Amputation Left 01/03/2014    Procedure: Left Transmetatarsal Amputation;  Surgeon: Nadara MustardMarcus Duda V, MD;  [facility];  Service: Orthopedics;  Laterality: Left;  . Transmetatarsal amputation Left 10/2013  . Colonoscopy    . Amputation Right 07/27/2014    Procedure: Right 4th Ray Amputation;  Surgeon: Nadara MustardMarcus Duda V, MD;  [facility];  Service: Orthopedics;  Laterality: Right;   Social History:  reports that he has never smoked. He has never used smokeless tobacco. He reports that he drinks about 2.4 oz of alcohol per week. He reports that he does not use illicit drugs. Where does patient live home. Can patient participate in ADLs? Yes.  No Known Allergies  Family History:  Family History  Problem Relation Age of Onset  . Hyperlipidemia Mother   . Lung cancer Father     asbestos releated  . Colon polyps Father   . Diabetes Neg Hx   . Coronary artery disease Neg Hx   . Prostate cancer Neg Hx       Prior to Admission medications   Medication Sig Start Date End Date Taking? Authorizing Provider  Alpha-Lipoic Acid 600 MG CAPS Take 1 capsule by mouth daily.   Yes Historical Provider, MD  ALPRAZolam Prudy Feeler(XANAX) 0.5 MG tablet Take 1 tablet (0.5 mg total) by mouth 3 (three) times daily. Patient taking differently: Take 0.5 mg by mouth  3 (three) times daily. Take every day per patient 06/20/14  Yes [name], MD  amLODipine (NORVASC) 5 MG tablet Take 1 tablet (5 mg total) by mouth daily. 07/20/14  Yes [name], MD  atorvastatin (LIPITOR) 40 MG tablet Take 1 tablet (40 mg total) by mouth at bedtime. 07/23/14  Yes [name], MD  b complex vitamins capsule Take 1 capsule by mouth daily.   Yes Historical Provider, MD  citalopram (CELEXA) 20 MG tablet Take 20 mg by mouth 2 (two) times daily.   Yes Historical Provider, MD  doxycycline (VIBRAMYCIN) 50 MG capsule Take 2 capsules (100 mg total) by mouth 2 (two) times daily. 07/27/14  Yes Nadara Mustard, MD  fenofibrate 160 MG tablet take 1  tablet by mouth once daily 01/18/14  Yes [name], MD  HYDROcodone-acetaminophen [facility]) 5-325 MG per tablet Take 1 tablet by mouth every 6 (six) hours as needed. 07/27/14  Yes Nadara Mustard, MD  Krill Oil 1000 MG CAPS Take 1,000 mg by mouth daily.    Yes Historical Provider, MD  losartan (COZAAR) 100 MG tablet Take 1 tablet (100 mg total) by mouth daily. 07/16/14  Yes [name], MD  LYRICA 75 MG capsule Take 75 mg by mouth 2 (two) times daily. 06/24/14  Yes Historical Provider, MD  naproxen sodium (ANAPROX) 220 MG tablet Take 220 mg by mouth 2 (two) times daily as needed. For pain   Yes Historical Provider, MD  oxymetazoline (AFRIN) 0.05 % nasal spray Place 1 spray into both nostrils 2 (two) times daily as needed for congestion.   Yes Historical Provider, MD  Psyllium (METAMUCIL PO) Take 2 capsules by mouth daily.   Yes Historical Provider, MD  traZODone (DESYREL) 50 MG tablet Take 25 mg by mouth daily as needed for sleep.    Yes Historical Provider, MD  VITAMIN B1-B12 PO Take 2,000 mg by mouth 2 (two) times daily.    Yes Historical Provider, MD  zolpidem (AMBIEN) 10 MG tablet Take 10 mg by mouth at bedtime as needed for sleep.   Yes Historical Provider, MD  aspirin EC 81 MG tablet Take 1 tablet (81 mg total) by mouth daily. Patient not taking: Reported on 08/15/2014 01/04/14   Nadara Mustard, MD    Physical Exam: Filed Vitals:   08/15/14 2015 08/15/14 2049 08/15/14 2100 08/15/14 2136  BP: 115/68 125/62 130/82 132/71  Pulse: 84 82 80 84  Temp:      TempSrc:      Resp: SpO2: 100% 100% 100% 100%     General:  Moderately built and nourished.  Eyes: Anicteric. No pallor.  ENT: No discharge from the ears eyes nose and mouth.  Neck: No mass felt.  Cardiovascular: S1 and S2 heard.  Respiratory: No rhonchi or crepitations.  Abdomen: Soft nontender bowel sounds present.  Skin: Right leg is in a dressing.  Musculoskeletal: Right leg is under dressing.  Psychiatric:  Appears normal.  Neurologic: Alert awake oriented to time place and present. Moves all extremities.  Labs on Admission:  Basic Metabolic Panel:  Recent Labs Lab 08/15/14 1810  NA 135  K 3.8  CL 105  CO2 20*  GLUCOSE 118*  BUN 57*  CREATININE 1.17  CALCIUM 8.7*   Liver Function Tests:  Recent Labs Lab 08/15/14 1810  AST 23  ALT 18  ALKPHOS 32*  BILITOT 0.5  PROT 5.5*  ALBUMIN 2.9*    Recent  Labs Lab 08/15/14 1810  LIPASE 25   No results for input(s): AMMONIA in the last 168 hours. CBC:  Recent Labs Lab 08/15/14 1810  WBC 6.9  NEUTROABS 4.8  HGB 9.6*  HCT 28.3*  MCV 89.6  PLT 287   Cardiac Enzymes: No results for input(s): CKTOTAL, CKMB, CKMBINDEX, TROPONINI in the last 168 hours.  BNP (last 3 results) No results for input(s): BNP in the last 8760 hours.  ProBNP (last 3 results) No results for input(s): PROBNP in the last 8760 hours.  CBG: No results for input(s): GLUCAP in the last 168 hours.  Radiological Exams on Admission: No results found.   Assessment/Plan Principal Problem:   GI bleed Active Problems:   Hyperlipidemia   HTN (hypertension)   Acute blood loss anemia   Acute GI bleeding   1. Acute GI bleed - most likely upper GI bleed. I have discussed with Dr. Ewing Schlein who will be seeing patient in consult for EGD. Patient has been kept nothing by mouth and on Protonix infusion. Closely follow CBC and if there is any drop in hemoglobin less than 7 or if patient becomes hypotensive we'll transfuse. Discontinue aspirin. 2. Hypertension - since patient is orthostatic and we will hold off antihypertensives. 3. Recent right foot fourth toe amputation for osteomyelitis - doxycycline has been changed to IV as patient is nothing by mouth. 4. History of hyperlipidemia - continue present medications and patient can take orally. 5. History of peripheral vascular disease - aspirin on hold due to GI bleed. 6. Acute blood loss anemia - follow  CBC.   DVT Prophylaxis SCDs.  Code Status: Full code.  Family Communication: Discussed with patient.  Disposition Plan: Admit to inpatient.    KAKRAKANDY,ARSHAD N. [facility] Pager [phone].  If 7PM-7AM, please contact night-coverage [email or web address] Password [email or web address] 08/15/2014, 10:06 PM

## 2014-08-15 NOTE — ED Notes (Signed)
Patient with lightheadedness when standing.

## 2014-08-15 NOTE — ED Provider Notes (Signed)
CSN: [id number]     Arrival date & time 08/15/14  1626 History   First MD Initiated Contact with Patient 08/15/14 1627     Chief Complaint  Patient presents with  . Melena  . Weakness     (Consider location/radiation/quality/duration/timing/severity/associated sxs/prior Treatment) HPI Comments: Patient is a 74 year old male with a past medical history of hyperlipidemia, hypertension, PVD, and arthritis who presents with a 4 day history of black, tarry stools and generalized weakness. Symptoms started gradually and remained constant since the onset. Patient denies passing any blood per rectum. Patient reports feeling generally weak and becomes tired easily with walking around the house, which is not normal for him. Patient does not take blood thinners. 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 He recently added 2 tablets of aleve per day over the last 4 days to his pain regimen. He reports taking the medication on an empty stomach because his doctor put him on a "veggie diet." No aggravating/alleviating factors. No other associated symptoms. Patient last seen by GI in 2011 for colonoscopy by [facility].    Past Medical History  Diagnosis Date  . Anxiety and depression 1995    symptoms started aprox 1995, after a divorce  . Hyperlipidemia     high TG  . Hypertension   . Neuropathy   . 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 History of alcohol abuse   . PVD (peripheral vascular disease)     PVD, mild saw Dr Allyson SabalBerry 2012, intolerant to pletal, Rx observation  . Heart murmur     as a baby " outgrew"  . 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 Family history of anesthesia complication     "father would start seeing things"  . Anxiety   . Arthritis    Past Surgical History  Procedure Laterality Date  . Cholecystectomy    . Tonsillectomy    . Amputation Left 11/20/13 and 11/24/13    middle  . Amputation  Left 01/03/2014    Procedure: Left Transmetatarsal Amputation;  Surgeon: Nadara MustardMarcus Duda V, MD;  [facility];  Service: Orthopedics;  Laterality: Left;  . Transmetatarsal amputation Left 10/2013  . Colonoscopy    . Amputation Right 07/27/2014    Procedure: Right 4th Ray Amputation;  Surgeon: Nadara MustardMarcus Duda V, MD;  [facility];  Service: Orthopedics;  Laterality: Right;   Family History  Problem Relation Age of Onset  . Hyperlipidemia Mother   . Lung cancer Father     asbestos releated  . Colon polyps Father   . Diabetes Neg Hx   . Coronary artery disease Neg Hx   . Prostate cancer Neg Hx    History  Substance Use Topics  . Smoking status: Never Smoker   . Smokeless tobacco: Never Used  . Alcohol Use: 2.4 oz/week    0 Standard drinks or equivalent, 4 Cans of beer per week     Comment: h/o abuse, now on weekends, slt heavier x last few months     Review of Systems  Constitutional: Negative for fever, chills and fatigue.  HENT: Negative for trouble swallowing.   Eyes: Negative for visual disturbance.  Respiratory: Negative for shortness of breath.   Cardiovascular: Negative for chest pain and palpitations.  Gastrointestinal: Negative for nausea, vomiting, abdominal pain and diarrhea.       Melena  Genitourinary:  Negative for dysuria and difficulty urinating.  Musculoskeletal: Negative for arthralgias and neck pain.  Skin: Negative for color change.  Neurological: Positive for weakness and light-headedness. Negative for dizziness.  Psychiatric/Behavioral: Negative for dysphoric mood.      Allergies  Review of patient's allergies indicates no known allergies.  Home Medications   Prior to Admission medications   Medication Sig Start Date End Date Taking? Authorizing Provider  Alpha-Lipoic Acid 600 MG CAPS Take 1 capsule by mouth daily.    Historical Provider, MD  ALPRAZolam Prudy Feeler) 0.5 MG tablet Take 1 tablet (0.5 mg total) by mouth 3 (three) times daily. 06/20/14   [name], MD  amLODipine (NORVASC) 5 MG tablet Take 1 tablet (5 mg total) by mouth daily. 07/20/14   [name], MD  aspirin EC 81 MG tablet Take 1 tablet (81 mg total) by mouth daily. 01/04/14   Nadara Mustard, MD  atorvastatin (LIPITOR) 40 MG tablet Take 1 tablet (40 mg total) by mouth at bedtime. 07/23/14   [name], MD  b complex vitamins capsule Take 1 capsule by mouth daily.    Historical Provider, MD  citalopram (CELEXA) 40 MG tablet Take 40 mg by mouth 2 (two) times daily.     Historical Provider, MD  doxycycline (VIBRAMYCIN) 100 MG capsule Take by mouth 2 (two) times daily. 07/21/14   Historical Provider, MD  doxycycline (VIBRAMYCIN) 50 MG capsule Take 2 capsules (100 mg total) by mouth 2 (two) times daily. 07/27/14   Nadara Mustard, MD  fenofibrate 160 MG tablet take 1 tablet by mouth once daily 01/18/14   [name], MD  HYDROcodone-acetaminophen [facility]) 5-325 MG per tablet Take 1 tablet by mouth every 6 (six) hours as needed. 07/27/14   Nadara Mustard, MD  Krill Oil 1000 MG CAPS Take 1,000 mg by mouth daily.     Historical Provider, MD  losartan (COZAAR) 100 MG tablet Take 1 tablet (100 mg total) by mouth daily. 07/16/14   [name], MD  LYRICA 75 MG capsule Take 75 mg by mouth 2 (two) times daily. 06/24/14   Historical Provider, MD  naproxen sodium (ANAPROX) 220 MG tablet Take 220 mg by mouth 2 (two) times daily as needed.    Historical Provider, MD  oxymetazoline (AFRIN) 0.05 % nasal spray Place 1 spray into both nostrils 2 (two) times daily as needed for congestion.    Historical Provider, MD  Psyllium (METAMUCIL PO) Take 2 capsules by mouth daily.    Historical Provider, MD  traZODone (DESYREL) 50 MG tablet Take 25 mg by mouth at bedtime.    Historical Provider, MD  VITAMIN B1-B12 PO Take 2,000 mg by mouth 2 (two) times daily.     Historical Provider, MD  zolpidem (AMBIEN) 10 MG tablet Take 10 mg by mouth at bedtime as needed for sleep.    Historical Provider, MD   BP 135/62 mmHg  Pulse 85   Temp(Src) 98.1 F (36.7 C) (Oral)  Resp 16  SpO2 98% Physical Exam  Constitutional: He is oriented to person, place, and time. He appears well-developed and well-nourished. No distress.  HENT:  Head: Normocephalic and atraumatic.  Eyes: Conjunctivae and EOM are normal.  Neck: Normal range of motion.  Cardiovascular: Normal rate and regular rhythm.  Exam reveals no gallop and no friction rub.   No murmur heard. Pulmonary/Chest: Effort normal and breath sounds normal. He has no wheezes. He has no rales. He exhibits no tenderness.  Abdominal: Soft. He exhibits no distension. There is no tenderness. There is no rebound and no guarding.  No focal tenderness to palpation.   Genitourinary: Rectum normal.  No frank blood per rectum. Dark, tarry stool.   Musculoskeletal: Normal range of motion.  Neurological: He is alert and oriented to person, place, and time. Coordination normal.  Speech is goal-oriented. Moves limbs without ataxia.   Skin: Skin is warm and dry.  Psychiatric: He has a normal mood and affect. His behavior is normal.  Nursing note and vitals reviewed.   ED Course  Procedures (including critical care time) Labs Review Labs Reviewed  CBC WITH DIFFERENTIAL/PLATELET - Abnormal; Notable for the following:    RBC 3.16 (*)    Hemoglobin 9.6 (*)    HCT 28.3 (*)    All other components within normal limits  COMPREHENSIVE METABOLIC PANEL - Abnormal; Notable for the following:    CO2 20 (*)    Glucose, Bld 118 (*)    BUN 57 (*)    Calcium 8.7 (*)    Total Protein 5.5 (*)    Albumin 2.9 (*)    Alkaline Phosphatase 32 (*)    All other components within normal limits  URINALYSIS, ROUTINE W REFLEX MICROSCOPIC (NOT AT [facility])  LIPASE, BLOOD  OCCULT BLOOD X 1 CARD TO LAB, STOOL  I-STAT TROPOININ, ED  TYPE AND SCREEN  ABO/RH    Imaging Review No results found.   EKG Interpretation None      MDM   Final diagnoses:  Gastrointestinal hemorrhage with melena  Generalized  weakness   7:28 PM Patient's hemoglobin dropped to 9.6 from 13.9 from 3 weeks prior. Patient's occult stool is positive for blood. Vitals stable and patient afebrile. I will consult GI and admit to medicine.   7:56 PM I spoke with Dr. Ewing Schlein who will see the patient and perform Endoscopy tomorrow morning. Patient will be medicine admit.   Emilia Beck, PA-C 08/16/14 0016  Nelva Nay, MD 08/16/14 [phone]

## 2014-08-15 NOTE — ED Notes (Signed)
Attempted to call report

## 2014-08-15 NOTE — Telephone Encounter (Signed)
Pt called in regarding concerning symptoms and not feeling well. He states he called 08/13/14 but I did not see notes. Pt states that Dr. Lajoyce Cornersuda had him taking doxycycline and Percocet 5.25 since surgery. Pt was also taking Aleve. He states since Sunday 08/12/14 he has been experiencing lightheadedness, memory loss, low blood pressure, black stools, and stomach pain. Transferred call to [facility].

## 2014-08-15 NOTE — Telephone Encounter (Signed)
Patient arrived at [facility] ED.   

## 2014-08-15 NOTE — ED Notes (Signed)
[facility] with c/o weakness, black tarry stools, abdominal pain, dizziness since Sunday. Pt stated that his abdominal pain was more pronounced Sunday and Monday. Pt stated that the abdominal pain has decreased some but weakness and decreased appetite has became worse. 12 lead with EMS NSR with some PVCs.

## 2014-08-15 NOTE — Telephone Encounter (Signed)
Please see [facility] note.  

## 2014-08-15 NOTE — Telephone Encounter (Signed)
[facility] Day - Client TELEPHONE ADVICE RECORD   [facility]     Patient Name: Rosana FretWILLIAM [place] Initial Comment Caller states he is having black stool, abdominal pain, light headedness, memory loss, and low blood pressure.  DOB: Jul 11, 1940      Nurse Assessment  Nurse: Harlon FlorWhitaker, RN, Darl PikesSusan Date/Time (Eastern Time): 08/15/2014 3:03:10 PM  Confirm and document reason for call. If symptomatic, describe symptoms. ---Caller states he is having black stool, abdominal pain, light headedness, memory loss, and low blood pressure. He has had an amputation of a toe 3 wks ago He has been on PO abx and he is on a vegan diet / He is on percocet as well stopped this med Sunday. He ended up with opiate related constipation and this was relieved yesterday. 89/64 unknown pulse. yesterday 142/87 today .  Has the patient traveled out of the country within the last 30 days? ---No  Does the patient require triage? ---Yes  Related visit to physician within the last 2 weeks? ---Yes  Does the PT have any chronic conditions? (i.e. diabetes, asthma, etc.) ---Yes  List chronic conditions. 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

## 2014-08-16 ENCOUNTER — Encounter (HOSPITAL_COMMUNITY): Payer: Self-pay

## 2014-08-16 ENCOUNTER — Encounter (HOSPITAL_COMMUNITY)
Admission: EM | Disposition: A | Discharge status: Home / Self Care | Payer: Self-pay | Source: Home / Self Care | Attending: Internal Medicine

## 2014-08-16 DIAGNOSIS — R531 Weakness: Secondary | ICD-10-CM

## 2014-08-16 HISTORY — PX: ESOPHAGOGASTRODUODENOSCOPY: SHX5428

## 2014-08-16 LAB — COMPREHENSIVE METABOLIC PANEL WITH GFR
ALT: 14 U/L — ABNORMAL LOW (ref 17–63)
AST: 19 U/L (ref 15–41)
Albumin: 2.5 g/dL — ABNORMAL LOW (ref 3.5–5.0)
Alkaline Phosphatase: 25 U/L — ABNORMAL LOW (ref 38–126)
Anion gap: 8 (ref 5–15)
BUN: 57 mg/dL — ABNORMAL HIGH (ref 6–20)
CO2: 20 mmol/L — ABNORMAL LOW (ref 22–32)
Calcium: 8.2 mg/dL — ABNORMAL LOW (ref 8.9–10.3)
Chloride: 107 mmol/L (ref 101–111)
Creatinine, Ser: 1.06 mg/dL (ref 0.61–1.24)
GFR calc Af Amer: >60 mL/min
GFR calc non Af Amer: >60 mL/min
Glucose, Bld: 134 mg/dL — ABNORMAL HIGH (ref 65–99)
Potassium: 4.1 mmol/L (ref 3.5–5.1)
Sodium: 135 mmol/L (ref 135–145)
Total Bilirubin: 0.4 mg/dL (ref 0.3–1.2)
Total Protein: 4.7 g/dL — ABNORMAL LOW (ref 6.5–8.1)

## 2014-08-16 LAB — CBC
HCT: 23.5 % — ABNORMAL LOW (ref 39.0–52.0)
HCT: 26.8 % — ABNORMAL LOW (ref 39.0–52.0)
HEMATOCRIT: 22.9 % — AB (ref 39.0–52.0)
HEMOGLOBIN: 8 g/dL — AB (ref 13.0–17.0)
Hemoglobin: 7.8 g/dL — ABNORMAL LOW (ref 13.0–17.0)
Hemoglobin: 9.2 g/dL — ABNORMAL LOW (ref 13.0–17.0)
MCH: 30.9 pg (ref 26.0–34.0)
MCH: 30.6 pg (ref 26.0–34.0)
MCH: 31 pg (ref 26.0–34.0)
MCHC: 34 g/dL (ref 30.0–36.0)
MCHC: 34.1 g/dL (ref 30.0–36.0)
MCHC: 34.3 g/dL (ref 30.0–36.0)
MCV: 90.7 fL (ref 78.0–100.0)
MCV: 89.8 fL (ref 78.0–100.0)
MCV: 90.2 fL (ref 78.0–100.0)
PLATELETS: 259 10*3/uL (ref 150–400)
Platelets: 255 10*3/uL (ref 150–400)
Platelets: 302 10*3/uL (ref 150–400)
RBC: 2.59 MIL/uL — ABNORMAL LOW (ref 4.22–5.81)
RBC: 2.55 MIL/uL — ABNORMAL LOW (ref 4.22–5.81)
RBC: 2.97 MIL/uL — AB (ref 4.22–5.81)
RDW: 12.9 % (ref 11.5–15.5)
RDW: 12.9 % (ref 11.5–15.5)
RDW: 12.9 % (ref 11.5–15.5)
WBC: 10.9 10*3/uL — ABNORMAL HIGH (ref 4.0–10.5)
WBC: 6.6 10*3/uL (ref 4.0–10.5)
WBC: 7.4 10*3/uL (ref 4.0–10.5)

## 2014-08-16 LAB — PREPARE RBC (CROSSMATCH)

## 2014-08-16 LAB — GLUCOSE, CAPILLARY
Glucose-Capillary: 152 mg/dL — ABNORMAL HIGH (ref 65–99)
Glucose-Capillary: 127 mg/dL — ABNORMAL HIGH (ref 65–99)
Glucose-Capillary: 122 mg/dL — ABNORMAL HIGH (ref 65–99)
Glucose-Capillary: 102 mg/dL — ABNORMAL HIGH (ref 65–99)

## 2014-08-16 LAB — MRSA PCR SCREENING: MRSA by PCR: NEGATIVE

## 2014-08-16 SURGERY — EGD (ESOPHAGOGASTRODUODENOSCOPY)
Anesthesia: Moderate Sedation

## 2014-08-16 MED ORDER — MIDAZOLAM HCL 5 MG/ML IJ SOLN
INTRAMUSCULAR | Status: AC
  Filled 2014-08-16: qty 2

## 2014-08-16 MED ORDER — BUTAMBEN-TETRACAINE-BENZOCAINE 2-2-14 % EX AERO
INHALATION_SPRAY | CUTANEOUS | Status: DC | PRN
  Administered 2014-08-16: 2 via TOPICAL

## 2014-08-16 MED ORDER — MIDAZOLAM HCL 10 MG/2ML IJ SOLN
INTRAMUSCULAR | Status: DC | PRN
  Administered 2014-08-16: 2 mg via INTRAVENOUS
  Administered 2014-08-16: 1 mg via INTRAVENOUS
  Administered 2014-08-16: 2 mg via INTRAVENOUS
  Administered 2014-08-16 (×2): 1 mg via INTRAVENOUS
  Administered 2014-08-16: 2 mg via INTRAVENOUS

## 2014-08-16 MED ORDER — DIPHENHYDRAMINE HCL 50 MG/ML IJ SOLN
INTRAMUSCULAR | Status: AC
  Filled 2014-08-16: qty 1

## 2014-08-16 MED ORDER — SODIUM CHLORIDE 0.9 % IV SOLN: INTRAVENOUS | Status: DC

## 2014-08-16 MED ORDER — ALPRAZOLAM 0.5 MG PO TABS
0.5000 mg | ORAL_TABLET | Freq: Every evening | ORAL | Status: DC | PRN
  Administered 2014-08-16: 0.5 mg via ORAL
  Filled 2014-08-16: qty 1

## 2014-08-16 MED ORDER — SODIUM CHLORIDE 0.9 % IV SOLN: Freq: Once | INTRAVENOUS | Status: AC

## 2014-08-16 MED ORDER — FENTANYL CITRATE (PF) 100 MCG/2ML IJ SOLN
INTRAMUSCULAR | Status: AC
  Filled 2014-08-16: qty 2

## 2014-08-16 MED ORDER — FENTANYL CITRATE (PF) 100 MCG/2ML IJ SOLN
INTRAMUSCULAR | Status: DC | PRN
  Administered 2014-08-16 (×3): 25 ug via INTRAVENOUS

## 2014-08-16 MED ORDER — DIPHENHYDRAMINE HCL 50 MG/ML IJ SOLN
INTRAMUSCULAR | Status: DC | PRN
  Administered 2014-08-16: 25 mg via INTRAVENOUS

## 2014-08-16 MED ORDER — BACITRACIN-NEOMYCIN-POLYMYXIN OINTMENT TUBE
TOPICAL_OINTMENT | Freq: Every day | CUTANEOUS | Status: DC
  Administered 2014-08-16 – 2014-08-17 (×2): via TOPICAL
  Filled 2014-08-16: qty 15

## 2014-08-16 NOTE — Progress Notes (Signed)
At ~0715Patient had increased dizzines, decreased LOC for ~20 seconds with no knowledged of change,  new onset of nausea, clamminess, and pale in appearance after transfer  From bed to wheelchair. Sinus tachycardia. See echarting for additional data. Patient vomited x2  ~63ml bright red emesis   Action: with help of staff assisted patient to bed, gave prn zofran, notified endo suite rn of neuro, vital sign change. x2 Rn escorted to endo suite with transporter  Response: patient remained pale in complexion, stated that nausea relieved after emesis and medicinal intervention. remained on cardiac monitor.

## 2014-08-16 NOTE — Telephone Encounter (Signed)
Pt is requesting refill on Alprazolam. Pt currently admitted for GI bleed.  Last OV: 07/20/2014 Last Fill: 06/20/2014 #90 1RF UDS: 01/20/2013 Low risk   Please advise.

## 2014-08-16 NOTE — Care Management Note (Addendum)
Case Management Note  Patient Details  Name: Gene [name] MRN: [id number] Date of Birth: 1941/01/27  Subjective/Objective:    Pt from home alone admitted with GI BLEED.              History of hypertension, hyperlipidemia and recent surgery for osteomyelitis of the left fourth toe.  Action/Plan: Return to home when medically stable. CM to f/u with d/c needs.  Expected Discharge Date:  08/17/14               Expected Discharge Plan:  Home/Self Care  In-House Referral:     Discharge planning Services  CM Consult  Post Acute Care Choice:    Choice offered to:     DME Arranged:    DME Agency:     HH Arranged:    HH Agency:     Status of Service:  In process, will continue to follow  Medicare Important Message Given:    Date Medicare IM Given:    Medicare IM give by:    Date Additional Medicare IM Given:    Additional Medicare Important Message give by:     If discussed at Long Length of Stay Meetings, dates discussed:    Additional Comments: Gene [name] (son) [phone]. Pt states has rolling walker @  home and crutches @ bedside in room that he owns. Gae Gallop [place], RN 08/16/2014, 11:19 AM

## 2014-08-16 NOTE — Telephone Encounter (Signed)
Send #90 no refills

## 2014-08-16 NOTE — Telephone Encounter (Signed)
Rx faxed to [facility] pharmacy.  

## 2014-08-16 NOTE — Op Note (Signed)
[facility] [address] [place], [address]   ENDOSCOPY PROCEDURE REPORT  PATIENT: Gene [name], Gene [name]  MR#: [id number] BIRTHDATE: 22-Mar-1940 , 73  yrs. old GENDER: male ENDOSCOPIST: Vida Rigger, MD REFERRED BY:  [name], M.D. PROCEDURE DATE:  08/16/2014 PROCEDURE:  EGD, diagnostic ASA CLASS:     Class II INDICATIONS:  hematemesis. MEDICATIONS: Benadryl 25 mg IV, Fentanyl 75 mcg IV, and Versed 9 mg IV TOPICAL ANESTHETIC: Cetacaine Spray  DESCRIPTION OF PROCEDURE: After the risks benefits and alternatives of the procedure were thoroughly explained, informed consent was obtained.  The [employer]AX GASTOROSCOPE [id number] endoscope was introduced through the mouth and advanced to the second portion of the duodenum , Without limitations.  The instrument was slowly withdrawn as the mucosa was fully examined. Estimated blood loss is zero unless otherwise noted in this procedure report.    the findings are recorded below       Retroflexed views revealed no abnormalities.  we were unable to suction all the proximal clots from the stomach including using the super suction device and we even rolled him on his back and raise the head of his bed in an effort to change the location of the clots but were still unsuccessful in finding an obvious bleeding source   The scope was then withdrawn from the patient and the procedure completed.  COMPLICATIONS: There were no immediate complications.  ENDOSCOPIC IMPRESSION: 1. Tiny hiatal hernia with minimal esophagitis 2. Clots and some fresh blood proximal stomach unable to completely suction as above 3. Antral erosions 4. Minimal proximal gastric erosions as well 5. Normal duodenal bulb and second portion of the duodenum without any blood in the duodenum 6. Otherwise within normal limits  RECOMMENDATIONS:we will allow clear liquids agree with transfusion rescoped when necessary signs of active bleeding if not will last my  partner to repeat endoscopy tomorrow in the meantime hold aspirin and non-steroidal's and continue pump inhibitors and based on those findings of endoscopy tomorrow we'll need to decide whether he can go back on aspirin and when I would not use non-steroidal's without pump inhibitors in the future and I'm happy to see back when necessary or in a few weeks to repeat CBC and proceed with a routine colonoscopy when he is stable for his history of polyps  REPEAT EXAM: tomorrow or when necessary  eSigned:  Vida Rigger, MD 08/16/2014 8:53 AM    KK:OECX Drue Novel, MD  CPT CODES: ICD CODES:  The ICD and CPT codes recommended by this software are interpretations from the data that the clinical staff has captured with the software.  The verification of the translation of this report to the ICD and CPT codes and modifiers is the sole responsibility of the health care institution and practicing physician where this report was generated.  [employer] will not be held responsible for the validity of the ICD and CPT codes included on this report.  AMA assumes no liability for data contained or not contained herein. CPT is a Publishing rights manager of the [employer].  PATIENT NAME:  [name], [name] MR#: [id number]

## 2014-08-16 NOTE — Progress Notes (Signed)
Data: patient admitted at ~2310 Via srtetcher with ED RN on cardiac monitor. Patient alert and oriented x4, FSC, MAEW. Denies c/o Numbness/ tingling, chest pain, discomfort or visual changes.  See echarting for additonal data.   action: reviewed and updated patient on POC. Oriented patient to floor, room, and call bell. Ensured needed items within reach and bed in lowest position. Assessed Right toe wound.  Action: patient safe, acknowledges understanding of POC and use of call bell.

## 2014-08-16 NOTE — Telephone Encounter (Signed)
Rx printed, awaiting MD signature.  

## 2014-08-16 NOTE — Consult Note (Addendum)
Reason for Consult upper GI bleeding and melana Referring Physician: Hospital team  Gene [name] is an 74 y.o. male.  HPI: Patient known to me from previous colonoscopy who is actually due for his history of polyps and his family history of polyps and his office computer chart as well as the hospital computer chart was reviewed who has not had much upper tract symptoms but has been on aspirin and non-steroidal's for his recent foot and toe problem and began having black stools with actually constipation on Sunday and yesterday felt tired and he continued to have black stools and was brought to the emergency room and found to be anemic and guaiac positive but then this morning he threw up some bright red blood and we are consulted for further workup and plans  Past Medical History  Diagnosis Date  . Anxiety and depression 1995    symptoms started aprox 1995, after a divorce  . Hyperlipidemia     high TG  . Hypertension   . Neuropathy   . 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 History of alcohol abuse   . PVD (peripheral vascular disease)     PVD, mild saw Dr Gwenlyn Found 2012, intolerant to pletal, Rx observation  . Heart murmur     as a baby " outgrew"  . 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 Family history of anesthesia complication     "father would start seeing things"  . Anxiety   . Arthritis     Past Surgical History  Procedure Laterality Date  . Cholecystectomy    . Tonsillectomy    . Amputation Left 11/20/13 and 11/24/13    middle  . Amputation Left 01/03/2014    Procedure: Left Transmetatarsal Amputation;  Surgeon: Newt Minion, MD;  Location: [place];  Service: Orthopedics;  Laterality: Left;  . Transmetatarsal amputation Left 10/2013  . Colonoscopy    . Amputation Right 07/27/2014    Procedure: Right 4th Ray Amputation;  Surgeon: Newt Minion, MD;  Location: [place];  Service: Orthopedics;  Laterality: Right;    Family History   Problem Relation Age of Onset  . Hyperlipidemia Mother   . Lung cancer Father     asbestos releated  . Colon polyps Father   . Diabetes Neg Hx   . Coronary artery disease Neg Hx   . Prostate cancer Neg Hx     Social History:  reports that he has never smoked. He has never used smokeless tobacco. He reports that he drinks about 2.4 oz of alcohol per week. He reports that he does not use illicit drugs.  Allergies: No Known Allergies  Medications: I have reviewed the patient's current medications.  Results for orders placed or performed during the hospital encounter of 08/15/14 (from the past 48 hour(s))  Type and screen for Red Blood Exchange     Status: None   Collection Time: 08/15/14  4:52 PM  Result Value Ref Range   ABO/RH(D) O POS    Antibody Screen NEG    Sample Expiration 08/18/2014   Urinalysis, Routine w reflex microscopic (not at [facility])     Status: None   Collection Time: 08/15/14  5:38 PM  Result Value Ref Range   Color, Urine YELLOW YELLOW   APPearance CLEAR CLEAR   Specific Gravity, Urine 1.022 1.005 - 1.030   pH 5.0 5.0 - 8.0   Glucose, UA NEGATIVE NEGATIVE mg/dL  Hgb urine dipstick NEGATIVE NEGATIVE   Bilirubin Urine NEGATIVE NEGATIVE   Ketones, ur NEGATIVE NEGATIVE mg/dL   Protein, ur NEGATIVE NEGATIVE mg/dL   Urobilinogen, UA 0.2 0.0 - 1.0 mg/dL   Nitrite NEGATIVE NEGATIVE   Leukocytes, UA NEGATIVE NEGATIVE    Comment: MICROSCOPIC NOT DONE ON URINES WITH NEGATIVE PROTEIN, BLOOD, LEUKOCYTES, NITRITE, OR GLUCOSE <1000 mg/dL.  CBC with Differential     Status: Abnormal   Collection Time: 08/15/14  6:10 PM  Result Value Ref Range   WBC 6.9 4.0 - 10.5 K/uL   RBC 3.16 (L) 4.22 - 5.81 MIL/uL   Hemoglobin 9.6 (L) 13.0 - 17.0 g/dL   HCT 28.3 (L) 39.0 - 52.0 %   MCV 89.6 78.0 - 100.0 fL   MCH 30.4 26.0 - 34.0 pg   MCHC 33.9 30.0 - 36.0 g/dL   RDW 12.7 11.5 - 15.5 %   Platelets 287 150 - 400 K/uL   Neutrophils Relative % 70 43 - 77 %   Neutro Abs 4.8 1.7 -  7.7 K/uL   Lymphocytes Relative 19 12 - 46 %   Lymphs Abs 1.3 0.7 - 4.0 K/uL   Monocytes Relative 11 3 - 12 %   Monocytes Absolute 0.7 0.1 - 1.0 K/uL   Eosinophils Relative 0 0 - 5 %   Eosinophils Absolute 0.0 0.0 - 0.7 K/uL   Basophils Relative 0 0 - 1 %   Basophils Absolute 0.0 0.0 - 0.1 K/uL  Comprehensive metabolic panel     Status: Abnormal   Collection Time: 08/15/14  6:10 PM  Result Value Ref Range   Sodium 135 135 - 145 mmol/L   Potassium 3.8 3.5 - 5.1 mmol/L   Chloride 105 101 - 111 mmol/L   CO2 20 (L) 22 - 32 mmol/L   Glucose, Bld 118 (H) 65 - 99 mg/dL   BUN 57 (H) 6 - 20 mg/dL   Creatinine, Ser 1.17 0.61 - 1.24 mg/dL   Calcium 8.7 (L) 8.9 - 10.3 mg/dL   Total Protein 5.5 (L) 6.5 - 8.1 g/dL   Albumin 2.9 (L) 3.5 - 5.0 g/dL   AST 23 15 - 41 U/L   ALT 18 17 - 63 U/L   Alkaline Phosphatase 32 (L) 38 - 126 U/L   Total Bilirubin 0.5 0.3 - 1.2 mg/dL   GFR calc non Af Amer >60 >60 mL/min   GFR calc Af Amer >60 >60 mL/min    Comment: (NOTE) The eGFR has been calculated using the CKD EPI equation. This calculation has not been validated in all clinical situations. eGFR's persistently <60 mL/min signify possible Chronic Kidney Disease.    Anion gap 10 5 - 15  Lipase, blood     Status: None   Collection Time: 08/15/14  6:10 PM  Result Value Ref Range   Lipase 25 22 - 51 U/L  ABO/Rh     Status: None   Collection Time: 08/15/14  6:10 PM  Result Value Ref Range   ABO/RH(D) O POS   I-stat troponin, ED     Status: None   Collection Time: 08/15/14  6:22 PM  Result Value Ref Range   Troponin i, poc 0.01 0.00 - 0.08 ng/mL   Comment 3            Comment: Due to the release kinetics of cTnI, a negative result within the first hours of the onset of symptoms does not rule out myocardial infarction with certainty. If myocardial infarction is  still suspected, repeat the test at appropriate intervals.   MRSA PCR Screening     Status: None   Collection Time: 08/15/14 11:28 PM   Result Value Ref Range   MRSA by PCR NEGATIVE NEGATIVE    Comment:        The GeneXpert MRSA Assay (FDA approved for NASAL specimens only), is one component of a comprehensive MRSA colonization surveillance program. It is not intended to diagnose MRSA infection nor to guide or monitor treatment for MRSA infections.   CBC     Status: Abnormal   Collection Time: 08/16/14 12:30 AM  Result Value Ref Range   WBC 6.6 4.0 - 10.5 K/uL   RBC 2.97 (L) 4.22 - 5.81 MIL/uL   Hemoglobin 9.2 (L) 13.0 - 17.0 g/dL   HCT 26.8 (L) 39.0 - 52.0 %   MCV 90.2 78.0 - 100.0 fL   MCH 31.0 26.0 - 34.0 pg   MCHC 34.3 30.0 - 36.0 g/dL   RDW 12.9 11.5 - 15.5 %   Platelets 255 150 - 400 K/uL  Glucose, capillary     Status: Abnormal   Collection Time: 08/16/14  2:57 AM  Result Value Ref Range   Glucose-Capillary 152 (H) 65 - 99 mg/dL  CBC     Status: Abnormal   Collection Time: 08/16/14  3:27 AM  Result Value Ref Range   WBC 7.4 4.0 - 10.5 K/uL   RBC 2.59 (L) 4.22 - 5.81 MIL/uL   Hemoglobin 8.0 (L) 13.0 - 17.0 g/dL   HCT 23.5 (L) 39.0 - 52.0 %   MCV 90.7 78.0 - 100.0 fL   MCH 30.9 26.0 - 34.0 pg   MCHC 34.0 30.0 - 36.0 g/dL   RDW 12.9 11.5 - 15.5 %   Platelets 259 150 - 400 K/uL  Comprehensive metabolic panel     Status: Abnormal   Collection Time: 08/16/14  3:27 AM  Result Value Ref Range   Sodium 135 135 - 145 mmol/L   Potassium 4.1 3.5 - 5.1 mmol/L   Chloride 107 101 - 111 mmol/L   CO2 20 (L) 22 - 32 mmol/L   Glucose, Bld 134 (H) 65 - 99 mg/dL   BUN 57 (H) 6 - 20 mg/dL   Creatinine, Ser 1.06 0.61 - 1.24 mg/dL   Calcium 8.2 (L) 8.9 - 10.3 mg/dL   Total Protein 4.7 (L) 6.5 - 8.1 g/dL   Albumin 2.5 (L) 3.5 - 5.0 g/dL   AST 19 15 - 41 U/L   ALT 14 (L) 17 - 63 U/L   Alkaline Phosphatase 25 (L) 38 - 126 U/L   Total Bilirubin 0.4 0.3 - 1.2 mg/dL   GFR calc non Af Amer >60 >60 mL/min   GFR calc Af Amer >60 >60 mL/min    Comment: (NOTE) The eGFR has been calculated using the CKD EPI  equation. This calculation has not been validated in all clinical situations. eGFR's persistently <60 mL/min signify possible Chronic Kidney Disease.    Anion gap 8 5 - 15  CBC     Status: Abnormal   Collection Time: 08/16/14  7:25 AM  Result Value Ref Range   WBC 10.9 (H) 4.0 - 10.5 K/uL   RBC 2.55 (L) 4.22 - 5.81 MIL/uL   Hemoglobin 7.8 (L) 13.0 - 17.0 g/dL   HCT 22.9 (L) 39.0 - 52.0 %   MCV 89.8 78.0 - 100.0 fL   MCH 30.6 26.0 - 34.0 pg   MCHC 34.1 30.0 -  36.0 g/dL   RDW 12.9 11.5 - 15.5 %   Platelets 302 150 - 400 K/uL    No results found.  ROS negative except above Blood pressure 138/71, pulse 99, temperature 97.7 F (36.5 C), temperature source Oral, resp. rate 15, SpO2 99 %. Physical Exam vital signs stable afebrile please see preassessment for physical exam evaluation labs reviewed   Assessment/Plan: Upper GI bleeding Plan: The risks benefits methods of endoscopy was discussed and we compared it to the colonoscopy he's had in the past and will proceed ASAP with further workup and plans pending those findings  [place] 08/16/2014, 8:11 AM

## 2014-08-16 NOTE — Progress Notes (Signed)
Physician notified: Gene [name] At: 1215  Regarding: FYI pt had medium size black tarry stool. Finishing up first unit PRBC, no further episodes of hematemesis.

## 2014-08-16 NOTE — Progress Notes (Signed)
[facility] PROGRESS NOTE  Gene [name] UJW:[id number] DOB: 09-24-1940 DOA: 08/15/2014 PCP: [name], MD  Assessment/Plan: Principal Problem:   GI bleed Active Problems:   Hyperlipidemia   HTN (hypertension)   Acute blood loss anemia   Acute GI bleeding   Generalized weakness    1. Acute GI bleed -   likely upper GI bleed. Upper endoscopy results as below, appreciate gastroenterology consult, Dr. Ewing Schlein , continue Protonix infusion. Holding aspirin. Advance diet per gastroenterology recommendations 2. Hypertension - since patient is orthostatic and we will hold off antihypertensives. 3. Recent right foot fourth toe amputation for osteomyelitis - doxycycline has been changed to IV as patient is nothing by mouth. Wound care consultation completed 4. History of hyperlipidemia - continue present medications and patient can take orally. 5. History of peripheral vascular disease - aspirin on hold due to GI bleed. 6. Acute blood loss anemia - hemoglobin dropped overnight, will transfuse 2 units of packed red blood cells 7.   Code Status: full Family Communication: family updated about patient's clinical progress Disposition Plan:  As above    Brief narrative: 74 y.o. 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 Patient has been noticing melanotic stool over the last few days with epigastric pain. Denies any nausea vomiting. Patient has been feeling dizzy on standing. Patient used to take aspirin for his peripheral vascular disease which he stopped taking 4 days ago after patient became dizzy. In the ER patient's hemoglobin is found to be around 9 which is a drop of 4 on 4 g from his previous 3 weeks ago. On call gastroenterologist Dr. Ewing Schlein has been consulted and patient has been admitted for GI bleed. Rectal exam shows melanotic stool as per  the ER physician. Patient also states in addition to Avastin he was taking Aleve for pain relief. Denies any chest pain or shortness of breath  Consultants:  Gastroenterology  Procedures: EGDENDOSCOPIC IMPRESSION: 1. Tiny hiatal hernia with minimal esophagitis 2. Clots and some fresh blood proximal stomach unable to completely suction as above 3. Antral erosions 4. Minimal proximal gastric erosions as well 5. Normal duodenal bulb and second portion of the  duodenum without any blood in the duodenum 6. Otherwise within normal limits  Antibiotics: Anti-infectives    Start     Dose/Rate Route Frequency Ordered Stop   08/15/14 2330  doxycycline (VIBRAMYCIN) 100 mg in dextrose 5 % 250 mL IVPB     100 mg 125 mL/hr over 120 Minutes Intravenous Every 12 hours 08/15/14 2329           HPI/Subjective: Patient had another episode of hematemesis this morning  Objective: Filed Vitals:   08/16/14 0840 08/16/14 0845 08/16/14 0850 08/16/14 0931  BP: 119/74 152/123 134/64 155/62  Pulse: 105 97 94 96  Temp:    98.1 F (36.7 C)  TempSrc:    Oral  Resp: Height:  (1.727 m)     Weight: 187.3 kg (412 lb 14.8 oz)     SpO2: 100% 99% 99% 98%    Intake/Output Summary (Last 24 hours) at 08/16/14 0947 Last data filed at 08/16/14 0600  Gross per 24 hour  Intake    865 ml  Output    700 ml  Net    165 ml    Exam:  General: No acute  respiratory distress Lungs: Clear to auscultation bilaterally without wheezes or crackles Cardiovascular: Regular rate and rhythm without murmur gallop or rub normal S1 and S2 Abdomen: Nontender, nondistended, soft, bowel sounds positive, no rebound, no ascites, no appreciable mass Extremities: No significant cyanosis, clubbing, or edema bilateral lower extremities     Data Review   Micro Results Recent Results (from the past 240 hour(s))  MRSA PCR Screening     Status: None   Collection Time: 08/15/14 11:28 PM  Result Value Ref Range  Status   MRSA by PCR NEGATIVE NEGATIVE Final    Comment:        The GeneXpert MRSA Assay (FDA approved for NASAL specimens only), is one component of a comprehensive MRSA colonization surveillance program. It is not intended to diagnose MRSA infection nor to guide or monitor treatment for MRSA infections.     Radiology Reports Dg Ankle Complete Left  07/30/2014   3 views of a skeletally mature individual were obtained of the left ankle.  Study includes AP, oblique, lateral projections.  Vessel calcifications present. There is no evidence of acute fracture or  stress fracture. Joint space maintained. Evidence of transmetatarsal  amputation.  Dg Foot Complete Right  07/30/2014   3 views of a skeletally mature individual were obtained of the right foot.  Study includes AP, oblique, lateral projections.  Vessel calcifications present. 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 There is abnormal osseous formation of the fourth digit. No  soft tissue emphysema.    CBC  Recent Labs Lab 08/15/14 1810 08/16/14 0030 08/16/14 0327 08/16/14 0725  WBC 6.9 6.6 7.4 10.9*  HGB 9.6* 9.2* 8.0* 7.8*  HCT 28.3* 26.8* 23.5* 22.9*  PLT 287 255 259 302  MCV 89.6 90.2 90.7 89.8  MCH 30.4 31.0 30.9 30.6  MCHC 33.9 34.3 34.0 34.1  RDW 12.7 12.9 12.9 12.9  LYMPHSABS 1.3  --   --   --   MONOABS 0.7  --   --   --   EOSABS 0.0  --   --   --   BASOSABS 0.0  --   --   --     Chemistries   Recent Labs Lab 08/15/14 1810 08/16/14 0327  NA 135 135  K 3.8 4.1  CL 105 107  CO2 20* 20*  GLUCOSE 118* 134*  BUN 57* 57*  CREATININE 1.17 1.06  CALCIUM 8.7* 8.2*  AST 23 19  ALT 18 14*  ALKPHOS 32* 25*  BILITOT 0.5 0.4   ------------------------------------------------------------------------------------------------------------------ estimated creatinine clearance is 103.4 mL/min (by C-G formula based on Cr of  1.06). ------------------------------------------------------------------------------------------------------------------ No results for input(s): HGBA1C in the last 72 hours. ------------------------------------------------------------------------------------------------------------------ No results for input(s): CHOL, HDL, LDLCALC, TRIG, CHOLHDL, LDLDIRECT in the last 72 hours. ------------------------------------------------------------------------------------------------------------------ No results for input(s): TSH, T4TOTAL, T3FREE, THYROIDAB in the last 72 hours.  Invalid input(s): FREET3 ------------------------------------------------------------------------------------------------------------------ No results for input(s): VITAMINB12, FOLATE, FERRITIN, TIBC, IRON, RETICCTPCT in the last 72 hours.  Coagulation profile No results for input(s): INR, PROTIME in the last 168 hours.  No results for input(s): DDIMER in the last 72 hours.  Cardiac Enzymes No results for input(s): CKMB, TROPONINI, MYOGLOBIN in the last 168 hours.  Invalid input(s): CK ------------------------------------------------------------------------------------------------------------------ Invalid input(s): POCBNP   CBG:  Recent Labs Lab 08/16/14 0257  GLUCAP 152*      Recent Results (from the past 240 hour(s))  MRSA PCR Screening     Status: None   Collection Time: 08/15/14 11:28  PM  Result Value Ref Range Status   MRSA by PCR NEGATIVE NEGATIVE Final    Comment:        The GeneXpert MRSA Assay (FDA approved for NASAL specimens only), is one component of a comprehensive MRSA colonization surveillance program. It is not intended to diagnose MRSA infection nor to guide or monitor treatment for MRSA infections.        Studies: No results found.    Lab Results  Component Value Date   HGBA1C 5.7* 07/20/2014   HGBA1C 5.7 12/26/2013   HGBA1C 5.4 10/09/2011   Lab Results  Component  Value Date   LDLCALC 95 07/20/2014   CREATININE 1.06 08/16/2014       Scheduled Meds: . sodium chloride   Intravenous Once  . doxycycline (VIBRAMYCIN) IV  100 mg Intravenous Q12H  . neomycin-bacitracin-polymyxin   Topical Daily  . [START ON 08/19/2014] pantoprazole (PROTONIX) IV  40 mg Intravenous Q12H   Continuous Infusions: . sodium chloride 500 mL (08/16/14 0848)  . pantoprozole (PROTONIX) infusion 8 mg/hr (08/16/14 0216)    Principal Problem:   GI bleed Active Problems:   Hyperlipidemia   HTN (hypertension)   Acute blood loss anemia   Acute GI bleeding   Generalized weakness    Time spent: 45 minutes   [facility]  [facility] Pager [phone]. If 7PM-7AM, please contact night-coverage at [email or web address], password [facility] 08/16/2014, 9:47 AM  LOS: 1 day

## 2014-08-16 NOTE — Consult Note (Signed)
WOC wound consult note Reason for Consult: Consult requested for right foot.  Pt was followed by Dr Lajoyce Corners prior to admission and had recent foot surgery.  He is due for follow-up apt with ortho service on Monday to assess wound and remove sutures. Wound type: Full thickness post-op wound to right anterior and plantar wound Measurement: 8X2X.6cm Wound bed: sutures loosly approximated over 75% of wound, 25% wound is open with slough/eschar and the depth indicated is from area below toes. Drainage (amount, consistency, odor) Small amt yellow drainage, no odor Periwound: Intact skin surrounding wound Dressing procedure/placement/frequency: Continue present plan of care ordered by Dr Lajoyce Corners with antibiotic ointment Q day and kerlex and ace wrap.  Pt is well-informed regarding plan of care and plans to follow-up with Dr Lajoyce Corners after discharge. Please re-consult if further assistance is needed.  Thank-you,  Cammie Mcgee MSN, RN, CWOCN, [place], CNS [phone]

## 2014-08-17 ENCOUNTER — Encounter (HOSPITAL_COMMUNITY)
Admission: EM | Disposition: A | Discharge status: Home / Self Care | Payer: Medicare Other | Source: Home / Self Care | Attending: Internal Medicine

## 2014-08-17 ENCOUNTER — Inpatient Hospital Stay (HOSPITAL_COMMUNITY): Payer: Medicare Other | Admitting: Critical Care Medicine

## 2014-08-17 ENCOUNTER — Encounter (HOSPITAL_COMMUNITY): Payer: Self-pay | Admitting: Critical Care Medicine

## 2014-08-17 DIAGNOSIS — D62 Acute posthemorrhagic anemia: Secondary | ICD-10-CM

## 2014-08-17 HISTORY — PX: ESOPHAGOGASTRODUODENOSCOPY (EGD) WITH PROPOFOL: SHX5813

## 2014-08-17 LAB — COMPREHENSIVE METABOLIC PANEL
ALK PHOS: 23 U/L — AB (ref 38–126)
ALT: 14 U/L — ABNORMAL LOW (ref 17–63)
AST: 21 U/L (ref 15–41)
Albumin: 2.4 g/dL — ABNORMAL LOW (ref 3.5–5.0)
Anion gap: 7 (ref 5–15)
BUN: 31 mg/dL — AB (ref 6–20)
CHLORIDE: 109 mmol/L (ref 101–111)
CO2: 22 mmol/L (ref 22–32)
Calcium: 7.9 mg/dL — ABNORMAL LOW (ref 8.9–10.3)
Creatinine, Ser: 1.05 mg/dL (ref 0.61–1.24)
GFR calc non Af Amer: >60 mL/min (ref >60)
GLUCOSE: 115 mg/dL — AB (ref 65–99)
Potassium: 3.8 mmol/L (ref 3.5–5.1)
Sodium: 138 mmol/L (ref 135–145)
Total Bilirubin: 0.9 mg/dL (ref 0.3–1.2)
Total Protein: 4.2 g/dL — ABNORMAL LOW (ref 6.5–8.1)

## 2014-08-17 LAB — CBC
HCT: 24 % — ABNORMAL LOW (ref 39.0–52.0)
HEMOGLOBIN: 8.3 g/dL — AB (ref 13.0–17.0)
MCH: 29.2 pg (ref 26.0–34.0)
MCHC: 34.6 g/dL (ref 30.0–36.0)
MCV: 84.5 fL (ref 78.0–100.0)
Platelets: 222 10*3/uL (ref 150–400)
RBC: 2.84 MIL/uL — AB (ref 4.22–5.81)
RDW: 16.8 % — ABNORMAL HIGH (ref 11.5–15.5)
WBC: 9.3 10*3/uL (ref 4.0–10.5)

## 2014-08-17 LAB — TYPE AND SCREEN
ABO/RH(D): O POS
ANTIBODY SCREEN: NEGATIVE
Unit division: 0
Unit division: 0

## 2014-08-17 LAB — GLUCOSE, CAPILLARY
Glucose-Capillary: 105 mg/dL — ABNORMAL HIGH (ref 65–99)
Glucose-Capillary: 110 mg/dL — ABNORMAL HIGH (ref 65–99)

## 2014-08-17 SURGERY — ESOPHAGOGASTRODUODENOSCOPY (EGD) WITH PROPOFOL
Anesthesia: General

## 2014-08-17 MED ORDER — FENTANYL CITRATE (PF) 100 MCG/2ML IJ SOLN
INTRAMUSCULAR | Status: DC | PRN
  Administered 2014-08-17: 50 ug via INTRAVENOUS

## 2014-08-17 MED ORDER — SODIUM CHLORIDE 0.9 % IV SOLN: INTRAVENOUS | Status: DC

## 2014-08-17 MED ORDER — PHENYLEPHRINE HCL 10 MG/ML IJ SOLN
INTRAMUSCULAR | Status: DC | PRN
  Administered 2014-08-17 (×2): 80 ug via INTRAVENOUS

## 2014-08-17 MED ORDER — SUCRALFATE 1 GM/10ML PO SUSP
1.0000 g | Freq: Four times a day (QID) | ORAL | Status: DC
  Administered 2014-08-17: 1 g via ORAL
  Filled 2014-08-17 (×4): qty 10

## 2014-08-17 MED ORDER — DEXAMETHASONE SODIUM PHOSPHATE 4 MG/ML IJ SOLN
INTRAMUSCULAR | Status: DC | PRN
  Administered 2014-08-17: 4 mg via INTRAVENOUS

## 2014-08-17 MED ORDER — PROPOFOL 10 MG/ML IV BOLUS
INTRAVENOUS | Status: DC | PRN
  Administered 2014-08-17: 140 mg via INTRAVENOUS

## 2014-08-17 MED ORDER — SUCRALFATE 1 GM/10ML PO SUSP: 1.0000 g | Freq: Four times a day (QID) | ORAL | Status: DC

## 2014-08-17 MED ORDER — LIDOCAINE HCL (CARDIAC) 20 MG/ML IV SOLN
INTRAVENOUS | Status: DC | PRN
  Administered 2014-08-17: 60 mg via INTRAVENOUS

## 2014-08-17 MED ORDER — MIDAZOLAM HCL 5 MG/5ML IJ SOLN
INTRAMUSCULAR | Status: DC | PRN
  Administered 2014-08-17: 2 mg via INTRAVENOUS

## 2014-08-17 MED ORDER — ONDANSETRON HCL 4 MG/2ML IJ SOLN
INTRAMUSCULAR | Status: DC | PRN
  Administered 2014-08-17: 4 mg via INTRAVENOUS

## 2014-08-17 MED ORDER — PANTOPRAZOLE SODIUM 40 MG PO TBEC: 40.0000 mg | DELAYED_RELEASE_TABLET | Freq: Two times a day (BID) | ORAL | Status: DC

## 2014-08-17 MED ORDER — SUCCINYLCHOLINE CHLORIDE 20 MG/ML IJ SOLN
INTRAMUSCULAR | Status: DC | PRN
  Administered 2014-08-17: 120 mg via INTRAVENOUS

## 2014-08-17 MED ORDER — LACTATED RINGERS IV SOLN
INTRAVENOUS | Status: DC | PRN
  Administered 2014-08-17: 09:00:00 via INTRAVENOUS

## 2014-08-17 NOTE — Transfer of Care (Signed)
Immediate Anesthesia Transfer of Care Note  Patient: Gene [name]  Procedure(s) Performed: Procedure(s): ESOPHAGOGASTRODUODENOSCOPY (EGD) WITH PROPOFOL (N/A)  Patient Location: Endoscopy Unit  Anesthesia Type:General  Level of Consciousness: awake, alert  and oriented  Airway & Oxygen Therapy: Patient Spontanous Breathing and Patient connected to nasal cannula oxygen  Post-op Assessment: Report given to RN, Post -op Vital signs reviewed and stable and Patient moving all extremities X 4  Post vital signs: Reviewed and stable  Last Vitals:  Filed Vitals:   08/17/14 0830  BP: 145/62  Pulse:   Temp:   Resp: 20    Complications: No apparent anesthesia complications

## 2014-08-17 NOTE — Op Note (Signed)
[facility] [address] [place], [address]   ENDOSCOPY PROCEDURE REPORT  PATIENT: [name], Gene  MR#: [id number] BIRTHDATE: [birth year] , 73  yrs. old GENDER: male ENDOSCOPIST: Wandalee Ferdinand, MD REFERRED BY: PROCEDURE DATE:  09-06-14 PROCEDURE:  EGD ASA CLASS:     2 INDICATIONS:  upper GI bleed MEDICATIONS: Mac per anesthesia TOPICAL ANESTHETIC:  DESCRIPTION OF PROCEDURE: After the risks benefits and alternatives of the procedure were thoroughly explained, informed consent was obtained.  The Pentax Gastroscope [id number] endoscope was introduced through the mouth and advanced to the second portion of the duodenum , Without limitations.  The instrument was slowly withdrawn as the mucosa was fully examined. Estimated blood loss is zero unless otherwise noted in this procedure report.  Findings:  Esophagus: There was a distal esophageal ulcer beginning at the level of the esophagogastric junction and extending upwards about 8 mm. See image 010. The rest of the esophagus looked normal.  Stomach: Mild antral erythema. Small erosion in proximal antrum. Linear erosion in more proximal greater curvature which was very superficial.  Duodenum: Normal  No blood in the upper GI tract on this exam today. No stigmata of bleeding.    The scope was then withdrawn from the patient and the procedure completed.  COMPLICATIONS: There were no immediate complications.  ENDOSCOPIC IMPRESSION:see above   RECOMMENDATIONS:PPI therapy. Carafate slurry 1 g 4 times a day. This ulcer in the distal esophagus may be a pill-induced esophageal ulcer as he has been on NSAIDs and doxycycline. He is experiencing epigastric discomfort. This can come from pill induced esophageal ulcers. This should take a few days to heal. We will sign off. Call us again if needed. He can follow-up as an outpatient with Dr. Ewing Schlein his gastroenterologist. advance diet as  tolerated.   REPEAT EXAM:  eSignedWandalee Ferdinand, MD 09-06-2014 9:54 AM    CC:  CPT CODES: ICD CODES:  The ICD and CPT codes recommended by this software are interpretations from the data that the clinical staff has captured with the software.  The verification of the translation of this report to the ICD and CPT codes and modifiers is the sole responsibility of the health care institution and practicing physician where this report was generated.  [employer] will not be held responsible for the validity of the ICD and CPT codes included on this report.  AMA assumes no liability for data contained or not contained herein. CPT is a Publishing rights manager of the [employer].  PATIENT NAME:  Gene, [name] MR#: [id number]

## 2014-08-17 NOTE — Anesthesia Postprocedure Evaluation (Signed)
  Anesthesia Post-op Note  Patient: Gene [name]  Procedure(s) Performed: Procedure(s): ESOPHAGOGASTRODUODENOSCOPY (EGD) WITH PROPOFOL (N/A)  Patient Location: Endoscopy Unit  Anesthesia Type:General  Level of Consciousness: awake, alert  and oriented  Airway and Oxygen Therapy: Patient Spontanous Breathing and Patient connected to nasal cannula oxygen  Post-op Pain: none  Post-op Assessment: Post-op Vital signs reviewed, Patient's Cardiovascular Status Stable, Respiratory Function Stable, Patent Airway and Pain level controlled              Post-op Vital Signs: stable  Last Vitals:  Filed Vitals:   08/17/14 1020  BP: 146/64  Pulse: 88  Temp:   Resp: 18    Complications: No apparent anesthesia complications

## 2014-08-17 NOTE — Progress Notes (Signed)
[facility] PROGRESS NOTE  Gene [name] ZOX:[id number] DOB: April 09, 1940 DOA: 08/15/2014 PCP: [name], MD  Assessment/Plan: Principal Problem:   GI bleed Active Problems:   Hyperlipidemia   HTN (hypertension)   Acute blood loss anemia   Acute GI bleeding   Generalized weakness    1. Acute GI bleed -   likely upper GI bleed. Upper endoscopy results as below, patient had a repeat endoscopy today which   Suggests pill  induced esophagitis secondary to NSAIDs and doxycycline , appreciate gastroenterology continue IV PPI, patient started on Carafate slurry,. Holding aspirin. Advance diet per gastroenterology recommendations. Transfer out of stepdown 2. Hypertension - since patient is orthostatic and we will hold off antihypertensives. 3. Recent right foot fourth toe amputation for osteomyelitis - doxycycline has been changed to IV as patient is nothing by mouth. Wound care consultation completed 4. History of hyperlipidemia - continue present medications and patient can take orally. 5. History of peripheral vascular disease - aspirin on hold due to GI bleed. 6. Acute blood loss anemia - minimal improvement in hemoglobin after 2 units of packed red blood cell infusion,, repeat CBC in the morning if stable patient can DC home 7.   Code Status: full Family Communication: family updated about patient's clinical progress Disposition Plan: Transfer to telemetry   Brief narrative: 74 y.o. 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 Patient has been noticing melanotic stool over the last few days with epigastric pain. Denies any nausea vomiting. Patient has been feeling dizzy on standing. Patient used to take aspirin for his peripheral vascular disease which he stopped taking 4 days ago after patient became dizzy. In the ER patient's hemoglobin is  found to be around 9 which is a drop of 4 on 4 g from his previous 3 weeks ago. On call gastroenterologist Dr. Ewing Schlein has been consulted and patient has been admitted for GI bleed. Rectal exam shows melanotic stool as per the ER physician. Patient also states in addition to Avastin he was taking Aleve for pain relief. Denies any chest pain or shortness of breath  Consultants:  Gastroenterology  Procedures: EGDENDOSCOPIC IMPRESSION: 1. Tiny hiatal hernia with minimal esophagitis 2. Clots and some fresh blood proximal stomach unable to completely suction as above 3. Antral erosions 4. Minimal proximal gastric erosions as well 5. Normal duodenal bulb and second portion of the  duodenum without any blood in the duodenum 6. Otherwise within normal limits  Antibiotics: Anti-infectives    Start     Dose/Rate Route Frequency Ordered Stop   08/15/14 2330  doxycycline (VIBRAMYCIN) 100 mg in dextrose 5 % 250 mL IVPB     100 mg 125 mL/hr over 120 Minutes Intravenous Every 12 hours 08/15/14 2329           HPI/Subjective: No further episodes of a hematemesis or melena overnight  Objective: Filed Vitals:   08/17/14 1000 08/17/14 1010 08/17/14 1018 08/17/14 1020  BP: 127/64 148/68 146/66 146/64  Pulse: 85 89 90 88  Temp:      TempSrc:      Resp: Height:      Weight:      SpO2: 96% 96% 96% 97%    Intake/Output Summary (Last 24 hours) at 08/17/14 1116 Last data filed at 08/17/14 0936  Gross per 24 hour  Intake  4508 ml  Output   1525 ml  Net   2983 ml    Exam:  General: No acute respiratory distress Lungs: Clear to auscultation bilaterally without wheezes or crackles Cardiovascular: Regular rate and rhythm without murmur gallop or rub normal S1 and S2 Abdomen: Nontender, nondistended, soft, bowel sounds positive, no rebound, no ascites, no appreciable mass Extremities: No significant cyanosis, clubbing, or edema bilateral lower extremities     Data Review    Micro Results Recent Results (from the past 240 hour(s))  MRSA PCR Screening     Status: None   Collection Time: 08/15/14 11:28 PM  Result Value Ref Range Status   MRSA by PCR NEGATIVE NEGATIVE Final    Comment:        The GeneXpert MRSA Assay (FDA approved for NASAL specimens only), is one component of a comprehensive MRSA colonization surveillance program. It is not intended to diagnose MRSA infection nor to guide or monitor treatment for MRSA infections.     Radiology Reports Dg Ankle Complete Left  07/30/2014   3 views of a skeletally mature individual were obtained of the left ankle.  Study includes AP, oblique, lateral projections.  Vessel calcifications present. There is no evidence of acute fracture or  stress fracture. Joint space maintained. Evidence of transmetatarsal  amputation.  Dg Foot Complete Right  07/30/2014   3 views of a skeletally mature individual were obtained of the right foot.  Study includes AP, oblique, lateral projections.  Vessel calcifications present. 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 There is abnormal osseous formation of the fourth digit. No  soft tissue emphysema.    CBC  Recent Labs Lab 08/15/14 1810 08/16/14 0030 08/16/14 0327 08/16/14 0725 08/17/14 0216  WBC 6.9 6.6 7.4 10.9* 9.3  HGB 9.6* 9.2* 8.0* 7.8* 8.3*  HCT 28.3* 26.8* 23.5* 22.9* 24.0*  PLT 287 255 259 302 222  MCV 89.6 90.2 90.7 89.8 84.5  MCH 30.4 31.0 30.9 30.6 29.2  MCHC 33.9 34.3 34.0 34.1 34.6  RDW 12.7 12.9 12.9 12.9 16.8*  LYMPHSABS 1.3  --   --   --   --   MONOABS 0.7  --   --   --   --   EOSABS 0.0  --   --   --   --   BASOSABS 0.0  --   --   --   --     Chemistries   Recent Labs Lab 08/15/14 1810 08/16/14 0327 08/17/14 0216  NA 135 135 138  K 3.8 4.1 3.8  CL 105 107 109  CO2 20* 20* 22  GLUCOSE 118* 134* 115*  BUN 57* 57* 31*  CREATININE 1.17 1.06 1.05  CALCIUM 8.7* 8.2* 7.9*  AST 23 19 21   ALT 18 14* 14*  ALKPHOS 32*  25* 23*  BILITOT 0.5 0.4 0.9   ------------------------------------------------------------------------------------------------------------------ estimated creatinine clearance is 67.9 mL/min (by C-G formula based on Cr of 1.05). ------------------------------------------------------------------------------------------------------------------ No results for input(s): HGBA1C in the last 72 hours. ------------------------------------------------------------------------------------------------------------------ No results for input(s): CHOL, HDL, LDLCALC, TRIG, CHOLHDL, LDLDIRECT in the last 72 hours. ------------------------------------------------------------------------------------------------------------------ No results for input(s): TSH, T4TOTAL, T3FREE, THYROIDAB in the last 72 hours.  Invalid input(s): FREET3 ------------------------------------------------------------------------------------------------------------------ No results for input(s): VITAMINB12, FOLATE, FERRITIN, TIBC, IRON, RETICCTPCT in the last 72 hours.  Coagulation profile No results for input(s): INR, PROTIME in the last 168 hours.  No results for input(s): DDIMER in the last 72 hours.  Cardiac Enzymes No results  for input(s): CKMB, TROPONINI, MYOGLOBIN in the last 168 hours.  Invalid input(s): CK ------------------------------------------------------------------------------------------------------------------ Invalid input(s): POCBNP   CBG:  Recent Labs Lab 08/16/14 0257 08/16/14 1213 08/16/14 1804 08/16/14 2325 08/17/14 0511  GLUCAP 152* 127* 122* 102* 105*      Recent Results (from the past 240 hour(s))  MRSA PCR Screening     Status: None   Collection Time: 08/15/14 11:28 PM  Result Value Ref Range Status   MRSA by PCR NEGATIVE NEGATIVE Final    Comment:        The GeneXpert MRSA Assay (FDA approved for NASAL specimens only), is one component of a comprehensive MRSA  colonization surveillance program. It is not intended to diagnose MRSA infection nor to guide or monitor treatment for MRSA infections.        Studies: No results found.    Lab Results  Component Value Date   HGBA1C 5.7* 07/20/2014   HGBA1C 5.7 12/26/2013   HGBA1C 5.4 10/09/2011   Lab Results  Component Value Date   LDLCALC 95 07/20/2014   CREATININE 1.05 08/17/2014       Scheduled Meds: . doxycycline (VIBRAMYCIN) IV  100 mg Intravenous Q12H  . neomycin-bacitracin-polymyxin   Topical Daily  . [START ON 08/19/2014] pantoprazole (PROTONIX) IV  40 mg Intravenous Q12H   Continuous Infusions: . sodium chloride    . sodium chloride    . sodium chloride 75 mL/hr at 08/17/14 0200  . pantoprozole (PROTONIX) infusion 8 mg/hr (08/17/14 0454)    Principal Problem:   GI bleed Active Problems:   Hyperlipidemia   HTN (hypertension)   Acute blood loss anemia   Acute GI bleeding   Generalized weakness    Time spent: 45 minutes   [facility]  [facility] Pager [phone]. If 7PM-7AM, please contact night-coverage at [email or web address], password [facility] 08/17/2014, 11:16 AM  LOS: 2 days

## 2014-08-17 NOTE — Discharge Summary (Addendum)
Physician Discharge Summary  Gene [name] MRN: [id number] DOB/AGE: March 06, 1941 74 y.o.  PCP: Kathlene November, MD   Admit date: 08/15/2014 Discharge date: 08/17/2014  Discharge Diagnoses:     Principal Problem:   GI bleed Active Problems:   Hyperlipidemia   HTN (hypertension)   Acute blood loss anemia   Acute GI bleeding   Generalized weakness pill induced esophagitis   Follow-up recommendations Follow-up with PCP in 3-5 days , including although additional recommended appointments as below Follow-up CBC, CMP in 3-5 days      Medication List    STOP taking these medications        aspirin EC 81 MG tablet     doxycycline 50 MG capsule  Commonly known as:  VIBRAMYCIN     losartan 100 MG tablet  Commonly known as:  COZAAR     naproxen sodium 220 MG tablet  Commonly known as:  ANAPROX      TAKE these medications        Alpha-Lipoic Acid 600 MG Caps  Take 1 capsule by mouth daily.     ALPRAZolam 0.5 MG tablet  Commonly known as:  XANAX  Take 1 tablet (0.5 mg total) by mouth 3 (three) times daily.     amLODipine 5 MG tablet  Commonly known as:  NORVASC  Take 1 tablet (5 mg total) by mouth daily.     atorvastatin 40 MG tablet  Commonly known as:  LIPITOR  Take 1 tablet (40 mg total) by mouth at bedtime.     b complex vitamins capsule  Take 1 capsule by mouth daily.     citalopram 20 MG tablet  Commonly known as:  CELEXA  Take 20 mg by mouth 2 (two) times daily.     fenofibrate 160 MG tablet  take 1 tablet by mouth once daily     HYDROcodone-acetaminophen 5-325 MG per tablet  Commonly known as:  NORCO  Take 1 tablet by mouth every 6 (six) hours as needed.     Krill Oil 1000 MG Caps  Take 1,000 mg by mouth daily.     LYRICA 75 MG capsule  Generic drug:  pregabalin  Take 75 mg by mouth 2 (two) times daily.     METAMUCIL PO  Take 2 capsules by mouth daily.     oxymetazoline 0.05 % nasal spray  Commonly known as:  AFRIN  Place 1 spray into both  nostrils 2 (two) times daily as needed for congestion.     pantoprazole 40 MG tablet  Commonly known as:  PROTONIX  Take 1 tablet (40 mg total) by mouth 2 (two) times daily.     sucralfate 1 GM/10ML suspension  Commonly known as:  CARAFATE  Take 10 mLs (1 g total) by mouth every 6 (six) hours.     traZODone 50 MG tablet  Commonly known as:  DESYREL  Take 25 mg by mouth daily as needed for sleep.     VITAMIN B1-B12 PO  Take 2,000 mg by mouth 2 (two) times daily.     zolpidem 10 MG tablet  Commonly known as:  AMBIEN  Take 10 mg by mouth at bedtime as needed for sleep.         Discharge Condition: Stable ,pt insistent on going home today rather than in am   Disposition: 01-Home or Self Care   Consults:  Stable   Significant Diagnostic Studies:  Dg Ankle Complete Left  07/30/2014   3 views of a skeletally  mature individual were obtained of the left ankle.  Study includes AP, oblique, lateral projections.  Vessel calcifications present. There is no evidence of acute fracture or  stress fracture. Joint space maintained. Evidence of transmetatarsal  amputation.  Dg Foot Complete Right  07/30/2014   3 views of a skeletally mature individual were obtained of the right foot.  Study includes AP, oblique, lateral projections.  Vessel calcifications present. 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 There is abnormal osseous formation of the fourth digit. No  soft tissue emphysema.      Filed Weights   08/16/14 0000 08/16/14 0840 08/17/14 0830  Weight: 187.3 kg (412 lb 14.8 oz) 187.3 kg (412 lb 14.8 oz) 86.183 kg (190 lb)     Microbiology: Recent Results (from the past 240 hour(s))  MRSA PCR Screening     Status: None   Collection Time: 08/15/14 11:28 PM  Result Value Ref Range Status   MRSA by PCR NEGATIVE NEGATIVE Final    Comment:        The GeneXpert MRSA Assay (FDA approved for NASAL specimens only), is one component of a comprehensive MRSA  colonization surveillance program. It is not intended to diagnose MRSA infection nor to guide or monitor treatment for MRSA infections.        Blood Culture No results found for: SDES, [place], CULT, REPTSTATUS    Labs: Results for orders placed or performed during the hospital encounter of 08/15/14 (from the past 48 hour(s))  Urinalysis, Routine w reflex microscopic (not at [facility])     Status: None   Collection Time: 08/15/14  5:38 PM  Result Value Ref Range   Color, Urine YELLOW YELLOW   APPearance CLEAR CLEAR   Specific Gravity, Urine 1.022 1.005 - 1.030   pH 5.0 5.0 - 8.0   Glucose, UA NEGATIVE NEGATIVE mg/dL   Hgb urine dipstick NEGATIVE NEGATIVE   Bilirubin Urine NEGATIVE NEGATIVE   Ketones, ur NEGATIVE NEGATIVE mg/dL   Protein, ur NEGATIVE NEGATIVE mg/dL   Urobilinogen, UA 0.2 0.0 - 1.0 mg/dL   Nitrite NEGATIVE NEGATIVE   Leukocytes, UA NEGATIVE NEGATIVE    Comment: MICROSCOPIC NOT DONE ON URINES WITH NEGATIVE PROTEIN, BLOOD, LEUKOCYTES, NITRITE, OR GLUCOSE <1000 mg/dL.  CBC with Differential     Status: Abnormal   Collection Time: 08/15/14  6:10 PM  Result Value Ref Range   WBC 6.9 4.0 - 10.5 K/uL   RBC 3.16 (L) 4.22 - 5.81 MIL/uL   Hemoglobin 9.6 (L) 13.0 - 17.0 g/dL   HCT 28.3 (L) 39.0 - 52.0 %   MCV 89.6 78.0 - 100.0 fL   MCH 30.4 26.0 - 34.0 pg   MCHC 33.9 30.0 - 36.0 g/dL   RDW 12.7 11.5 - 15.5 %   Platelets 287 150 - 400 K/uL   Neutrophils Relative % 70 43 - 77 %   Neutro Abs 4.8 1.7 - 7.7 K/uL   Lymphocytes Relative 19 12 - 46 %   Lymphs Abs 1.3 0.7 - 4.0 K/uL   Monocytes Relative 11 3 - 12 %   Monocytes Absolute 0.7 0.1 - 1.0 K/uL   Eosinophils Relative 0 0 - 5 %   Eosinophils Absolute 0.0 0.0 - 0.7 K/uL   Basophils Relative 0 0 - 1 %   Basophils Absolute 0.0 0.0 - 0.1 K/uL  Comprehensive metabolic panel     Status: Abnormal   Collection Time: 08/15/14  6:10 PM  Result Value Ref Range  Sodium 135 135 - 145 mmol/L   Potassium 3.8 3.5 - 5.1  mmol/L   Chloride 105 101 - 111 mmol/L   CO2 20 (L) 22 - 32 mmol/L   Glucose, Bld 118 (H) 65 - 99 mg/dL   BUN 57 (H) 6 - 20 mg/dL   Creatinine, Ser 1.17 0.61 - 1.24 mg/dL   Calcium 8.7 (L) 8.9 - 10.3 mg/dL   Total Protein 5.5 (L) 6.5 - 8.1 g/dL   Albumin 2.9 (L) 3.5 - 5.0 g/dL   AST 23 15 - 41 U/L   ALT 18 17 - 63 U/L   Alkaline Phosphatase 32 (L) 38 - 126 U/L   Total Bilirubin 0.5 0.3 - 1.2 mg/dL   GFR calc non Af Amer >60 >60 mL/min   GFR calc Af Amer >60 >60 mL/min    Comment: (NOTE) The eGFR has been calculated using the CKD EPI equation. This calculation has not been validated in all clinical situations. eGFR's persistently <60 mL/min signify possible Chronic Kidney Disease.    Anion gap 10 5 - 15  Type and screen for Red Blood Exchange     Status: None   Collection Time: 08/15/14  6:10 PM  Result Value Ref Range   ABO/RH(D) O POS    Antibody Screen NEG    Sample Expiration 08/18/2014    Unit Number [id number]    Blood Component Type RBC LR PHER2    Unit division 00    Status of Unit ISSUED,FINAL    Transfusion Status OK TO TRANSFUSE    Crossmatch Result Compatible    Unit Number [id number]    Blood Component Type RED CELLS,LR    Unit division 00    Status of Unit ISSUED,FINAL    Transfusion Status OK TO TRANSFUSE    Crossmatch Result Compatible   Lipase, blood     Status: None   Collection Time: 08/15/14  6:10 PM  Result Value Ref Range   Lipase 25 22 - 51 U/L  ABO/Rh     Status: None   Collection Time: 08/15/14  6:10 PM  Result Value Ref Range   ABO/RH(D) O POS   I-stat troponin, ED     Status: None   Collection Time: 08/15/14  6:22 PM  Result Value Ref Range   Troponin i, poc 0.01 0.00 - 0.08 ng/mL   Comment 3            Comment: Due to the release kinetics of cTnI, a negative result within the first hours of the onset of symptoms does not rule out myocardial infarction with certainty. If myocardial infarction is still suspected, repeat the test  at appropriate intervals.   MRSA PCR Screening     Status: None   Collection Time: 08/15/14 11:28 PM  Result Value Ref Range   MRSA by PCR NEGATIVE NEGATIVE    Comment:        The GeneXpert MRSA Assay (FDA approved for NASAL specimens only), is one component of a comprehensive MRSA colonization surveillance program. It is not intended to diagnose MRSA infection nor to guide or monitor treatment for MRSA infections.   CBC     Status: Abnormal   Collection Time: 08/16/14 12:30 AM  Result Value Ref Range   WBC 6.6 4.0 - 10.5 K/uL   RBC 2.97 (L) 4.22 - 5.81 MIL/uL   Hemoglobin 9.2 (L) 13.0 - 17.0 g/dL   HCT 26.8 (L) 39.0 - 52.0 %   MCV 90.2 78.0 - 100.0  fL   MCH 31.0 26.0 - 34.0 pg   MCHC 34.3 30.0 - 36.0 g/dL   RDW 12.9 11.5 - 15.5 %   Platelets 255 150 - 400 K/uL  Glucose, capillary     Status: Abnormal   Collection Time: 08/16/14  2:57 AM  Result Value Ref Range   Glucose-Capillary 152 (H) 65 - 99 mg/dL  CBC     Status: Abnormal   Collection Time: 08/16/14  3:27 AM  Result Value Ref Range   WBC 7.4 4.0 - 10.5 K/uL   RBC 2.59 (L) 4.22 - 5.81 MIL/uL   Hemoglobin 8.0 (L) 13.0 - 17.0 g/dL   HCT 23.5 (L) 39.0 - 52.0 %   MCV 90.7 78.0 - 100.0 fL   MCH 30.9 26.0 - 34.0 pg   MCHC 34.0 30.0 - 36.0 g/dL   RDW 12.9 11.5 - 15.5 %   Platelets 259 150 - 400 K/uL  Comprehensive metabolic panel     Status: Abnormal   Collection Time: 08/16/14  3:27 AM  Result Value Ref Range   Sodium 135 135 - 145 mmol/L   Potassium 4.1 3.5 - 5.1 mmol/L   Chloride 107 101 - 111 mmol/L   CO2 20 (L) 22 - 32 mmol/L   Glucose, Bld 134 (H) 65 - 99 mg/dL   BUN 57 (H) 6 - 20 mg/dL   Creatinine, Ser 1.06 0.61 - 1.24 mg/dL   Calcium 8.2 (L) 8.9 - 10.3 mg/dL   Total Protein 4.7 (L) 6.5 - 8.1 g/dL   Albumin 2.5 (L) 3.5 - 5.0 g/dL   AST 19 15 - 41 U/L   ALT 14 (L) 17 - 63 U/L   Alkaline Phosphatase 25 (L) 38 - 126 U/L   Total Bilirubin 0.4 0.3 - 1.2 mg/dL   GFR calc non Af Amer >60 >60 mL/min   GFR  calc Af Amer >60 >60 mL/min    Comment: (NOTE) The eGFR has been calculated using the CKD EPI equation. This calculation has not been validated in all clinical situations. eGFR's persistently <60 mL/min signify possible Chronic Kidney Disease.    Anion gap 8 5 - 15  CBC     Status: Abnormal   Collection Time: 08/16/14  7:25 AM  Result Value Ref Range   WBC 10.9 (H) 4.0 - 10.5 K/uL   RBC 2.55 (L) 4.22 - 5.81 MIL/uL   Hemoglobin 7.8 (L) 13.0 - 17.0 g/dL   HCT 22.9 (L) 39.0 - 52.0 %   MCV 89.8 78.0 - 100.0 fL   MCH 30.6 26.0 - 34.0 pg   MCHC 34.1 30.0 - 36.0 g/dL   RDW 12.9 11.5 - 15.5 %   Platelets 302 150 - 400 K/uL  Prepare RBC     Status: None   Collection Time: 08/16/14  8:43 AM  Result Value Ref Range   Order Confirmation ORDER PROCESSED BY BLOOD BANK   Glucose, capillary     Status: Abnormal   Collection Time: 08/16/14 12:13 PM  Result Value Ref Range   Glucose-Capillary 127 (H) 65 - 99 mg/dL  Glucose, capillary     Status: Abnormal   Collection Time: 08/16/14  6:04 PM  Result Value Ref Range   Glucose-Capillary 122 (H) 65 - 99 mg/dL  Glucose, capillary     Status: Abnormal   Collection Time: 08/16/14 11:25 PM  Result Value Ref Range   Glucose-Capillary 102 (H) 65 - 99 mg/dL  CBC     Status: Abnormal  Collection Time: 08/17/14  2:16 AM  Result Value Ref Range   WBC 9.3 4.0 - 10.5 K/uL   RBC 2.84 (L) 4.22 - 5.81 MIL/uL   Hemoglobin 8.3 (L) 13.0 - 17.0 g/dL   HCT 24.0 (L) 39.0 - 52.0 %   MCV 84.5 78.0 - 100.0 fL   MCH 29.2 26.0 - 34.0 pg   MCHC 34.6 30.0 - 36.0 g/dL   RDW 16.8 (H) 11.5 - 15.5 %   Platelets 222 150 - 400 K/uL  Comprehensive metabolic panel     Status: Abnormal   Collection Time: 08/17/14  2:16 AM  Result Value Ref Range   Sodium 138 135 - 145 mmol/L   Potassium 3.8 3.5 - 5.1 mmol/L   Chloride 109 101 - 111 mmol/L   CO2 22 22 - 32 mmol/L   Glucose, Bld 115 (H) 65 - 99 mg/dL   BUN 31 (H) 6 - 20 mg/dL   Creatinine, Ser 1.05 0.61 - 1.24 mg/dL    Calcium 7.9 (L) 8.9 - 10.3 mg/dL   Total Protein 4.2 (L) 6.5 - 8.1 g/dL   Albumin 2.4 (L) 3.5 - 5.0 g/dL   AST 21 15 - 41 U/L   ALT 14 (L) 17 - 63 U/L   Alkaline Phosphatase 23 (L) 38 - 126 U/L   Total Bilirubin 0.9 0.3 - 1.2 mg/dL   GFR calc non Af Amer >60 >60 mL/min   GFR calc Af Amer >60 >60 mL/min    Comment: (NOTE) The eGFR has been calculated using the CKD EPI equation. This calculation has not been validated in all clinical situations. eGFR's persistently <60 mL/min signify possible Chronic Kidney Disease.    Anion gap 7 5 - 15  Glucose, capillary     Status: Abnormal   Collection Time: 08/17/14  5:11 AM  Result Value Ref Range   Glucose-Capillary 105 (H) 65 - 99 mg/dL     Lipid Panel     Component Value Date/Time   CHOL 164 07/20/2014 1438   TRIG 130 07/20/2014 1438   TRIG 202* 01/07/2006 1456   HDL 43 07/20/2014 1438   CHOLHDL 3.8 07/20/2014 1438   CHOLHDL 4.9 CALC 01/07/2006 1456   VLDL 26 07/20/2014 1438   LDLCALC 95 07/20/2014 1438   LDLDIRECT 68.2 01/20/2013 1055   LDLDIRECT 103.8 01/07/2006 1456     Lab Results  Component Value Date   HGBA1C 5.7* 07/20/2014   HGBA1C 5.7 12/26/2013   HGBA1C 5.4 10/09/2011     Lab Results  Component Value Date   LDLCALC 95 07/20/2014   CREATININE 1.05 08/17/2014     HPI :74 y.o. 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 Patient has been noticing melanotic stool over the last few days with epigastric pain. Denies any nausea vomiting. Patient has been feeling dizzy on standing. Patient used to take aspirin for his peripheral vascular disease which he stopped taking 4 days ago after patient became dizzy. In the ER patient's hemoglobin is found to be around 9 which is a drop of 4 on 4 g from his previous 3 weeks ago. On call gastroenterologist Dr. Watt Climes has been consulted and  patient has been admitted for GI bleed. Rectal exam shows melanotic stool as per the ER physician. Patient also states in addition to Avastin he was taking Aleve for pain relief. Denies any chest pain or  shortness of breath  Consultants: 1. Gastroenterology  Procedures: EGDENDOSCOPIC IMPRESSION: 1. Tiny hiatal hernia with minimal esophagitis 2. Clots and some fresh blood proximal stomach unable to completely suction as above 3. Antral erosions 4. Minimal proximal gastric erosions as well 5. Normal duodenal bulb and second portion of the  duodenum without any blood in the duodenum 6. Otherwise within normal limits    HOSPITAL COURSE:    2. Acute GI bleed - likely upper GI bleed. Upper endoscopy results as below, patient had a repeat endoscopy today which Suggests pill induced esophagitis secondary to NSAIDs and doxycycline , appreciate gastroenterology continue IV PPI, patient started on Carafate slurry,. Holding aspirin. Advance diet per gastroenterology recommendations. Patient insisted to go home today rather than in am after a recheck of his CBC  3. Hypertension - since patient is orthostatic and we will hold off antihypertensives. 4. Recent right foot fourth toe amputation for osteomyelitis - doxycycline has been changed to IV as patient is nothing by mouth. Wound care consultation completed 5. History of hyperlipidemia - continue present medications and patient can take orally. 6. History of peripheral vascular disease - aspirin on hold due to GI bleed. 7. Acute blood loss anemia - minimal improvement in hemoglobin after 2 units of packed red blood cell infusion,, patient refuses to wait for  CBC in the morning     Discharge Exam:    Blood pressure 146/64, pulse 88, temperature 98.4 F (36.9 C), temperature source Oral, resp. rate 18, height '5\' 8"'  (1.727 m), weight 86.183 kg (190 lb), SpO2 97 %.  General: No acute respiratory distress Lungs: Clear to auscultation bilaterally  without wheezes or crackles Cardiovascular: Regular rate and rhythm without murmur gallop or rub normal S1 and S2 Abdomen: Nontender, nondistended, soft, bowel sounds positive, no rebound, no ascites, no appreciable mass Extremities: No significant cyanosis, clubbing, or edema bilateral lower extremities        Discharge Instructions    Diet - low sodium heart healthy    Complete by:  As directed      Increase activity slowly    Complete by:  As directed            Follow-up Information    Follow up with Kathlene November, MD. Schedule an appointment as soon as possible for a visit in 3 days.   Specialty:  Internal Medicine   Why:  cbc in follow up   Contact information:   [place] [address] [phone]       Signed: Reyne Dumas 08/17/2014, 12:25 PM        Time spent >45 mins

## 2014-08-17 NOTE — Progress Notes (Signed)
08/17/2014 5:07 PM  Pt's iv d/c'd. Discharge instructions gone over with patient. Patient waiting for ride. Gene [name]

## 2014-08-17 NOTE — Anesthesia Procedure Notes (Signed)
Procedure Name: Intubation Date/Time: 08/17/2014 9:25 AM Performed by: Glo Herring B Pre-anesthesia Checklist: Patient identified, Emergency Drugs available, Suction available, Patient being monitored and Timeout performed Patient Re-evaluated:Patient Re-evaluated prior to inductionOxygen Delivery Method: Circle system utilized Intubation Type: IV induction and Cricoid Pressure applied Laryngoscope Size: Mac and 4 Grade View: Grade III Tube type: Oral Tube size: 8.0 mm Number of attempts: 1 Airway Equipment and Method: Stylet Placement Confirmation: breath sounds checked- equal and bilateral,  CO2 detector,  positive ETCO2 and ETT inserted through vocal cords under direct vision Secured at: 23 cm Tube secured with: Tape Dental Injury: Teeth and Oropharynx as per pre-operative assessment

## 2014-08-17 NOTE — Anesthesia Preprocedure Evaluation (Addendum)
Anesthesia Evaluation  Patient identified by MRN, date of birth, ID band Patient awake    Airway Mallampati: II  TM Distance: >3 FB Neck ROM: Full    Dental  (+) Teeth Intact, Dental Advisory Given   Pulmonary  breath sounds clear to auscultation        Cardiovascular hypertension, Pt. on medications + Peripheral Vascular Disease Rhythm:Regular Rate:Normal     Neuro/Psych Anxiety    GI/Hepatic   Endo/Other  Morbid obesity  Renal/GU      Musculoskeletal  (+) Arthritis -,   Abdominal (+) + obese,   Peds  Hematology  (+) anemia ,   Anesthesia Other Findings   Reproductive/Obstetrics                           Anesthesia Physical Anesthesia Plan  ASA: III  Anesthesia Plan: General   Post-op Pain Management:    Induction: Intravenous  Airway Management Planned: Oral ETT  Additional Equipment:   Intra-op Plan:   Post-operative Plan: Extubation in OR  Informed Consent: I have reviewed the patients History and Physical, chart, labs and discussed the procedure including the risks, benefits and alternatives for the proposed anesthesia with the patient or authorized representative who has indicated his/her understanding and acceptance.   Dental advisory given  Plan Discussed with: Anesthesiologist, Surgeon and CRNA  Anesthesia Plan Comments: (Attempted moderate sedation yesterday - unable to complete study.   Anemia Hct 24.3  Plan GA with oral ETT Type and screen active.  Kipp Brood)      Anesthesia Quick Evaluation

## 2014-08-20 ENCOUNTER — Encounter (HOSPITAL_COMMUNITY): Payer: Self-pay | Admitting: Gastroenterology

## 2014-08-22 ENCOUNTER — Other Ambulatory Visit (HOSPITAL_COMMUNITY): Payer: Medicare Other

## 2014-08-23 ENCOUNTER — Ambulatory Visit (INDEPENDENT_AMBULATORY_CARE_PROVIDER_SITE_OTHER): Payer: Medicare Other | Admitting: Internal Medicine

## 2014-08-23 ENCOUNTER — Other Ambulatory Visit (INDEPENDENT_AMBULATORY_CARE_PROVIDER_SITE_OTHER): Payer: Medicare Other

## 2014-08-23 VITALS — BP 109/71 | HR 87

## 2014-08-23 DIAGNOSIS — D62 Acute posthemorrhagic anemia: Secondary | ICD-10-CM

## 2014-08-23 DIAGNOSIS — D649 Anemia, unspecified: Secondary | ICD-10-CM

## 2014-08-23 DIAGNOSIS — I10 Essential (primary) hypertension: Secondary | ICD-10-CM | POA: Diagnosis not present

## 2014-08-23 NOTE — Progress Notes (Signed)
Pre visit review using our clinic review tool, if applicable. No additional management support is needed unless otherwise documented below in the visit note.  Patient presents today for BP check.  Per patient, he was told by eye doctor at [facility] to f/u and have BP checked.  Verbal order per Dr. Drue Novel OK to check BP.  Hospital f/u scheduled with Dr. Drue Novel 08/28/14.   Verbal orders from Dr. Drue Novel for CBC, CMP, and iron/ferritin.  Patient escorted to lab.

## 2014-08-24 ENCOUNTER — Telehealth: Payer: Self-pay | Admitting: Internal Medicine

## 2014-08-24 ENCOUNTER — Other Ambulatory Visit: Payer: Self-pay | Admitting: Internal Medicine

## 2014-08-24 LAB — CBC WITH DIFFERENTIAL/PLATELET
BASOS ABS: 0 10*3/uL (ref 0.0–0.1)
BASOS PCT: 0.5 % (ref 0.0–3.0)
Eosinophils Absolute: 0.2 10*3/uL (ref 0.0–0.7)
Eosinophils Relative: 3.1 % (ref 0.0–5.0)
Hemoglobin: 8.3 g/dL — ABNORMAL LOW (ref 13.0–17.0)
LYMPHS ABS: 1.6 10*3/uL (ref 0.7–4.0)
Lymphocytes Relative: 23.6 % (ref 12.0–46.0)
MCHC: 33.3 g/dL (ref 30.0–36.0)
MCV: 91.4 fl (ref 78.0–100.0)
MONOS PCT: 2 % — AB (ref 3.0–12.0)
Monocytes Absolute: 0.1 10*3/uL (ref 0.1–1.0)
Neutro Abs: 4.8 10*3/uL (ref 1.4–7.7)
Neutrophils Relative %: 70.8 % (ref 43.0–77.0)
Platelets: 238 10*3/uL (ref 150.0–400.0)
RBC: 2.72 Mil/uL — AB (ref 4.22–5.81)
RDW: 19.8 % — AB (ref 11.5–15.5)
WBC: 6.7 10*3/uL (ref 4.0–10.5)

## 2014-08-24 LAB — COMPREHENSIVE METABOLIC PANEL
ALT: 16 U/L (ref 0–53)
AST: 17 U/L (ref 0–37)
Albumin: 3.3 g/dL — ABNORMAL LOW (ref 3.5–5.2)
Alkaline Phosphatase: 26 U/L — ABNORMAL LOW (ref 39–117)
BUN: 20 mg/dL (ref 6–23)
CALCIUM: 8.5 mg/dL (ref 8.4–10.5)
CO2: 22 meq/L (ref 19–32)
CREATININE: 1.15 mg/dL (ref 0.40–1.50)
Chloride: 111 mEq/L (ref 96–112)
GFR: 66.26 mL/min (ref >60.00)
Glucose, Bld: 97 mg/dL (ref 70–99)
Potassium: 3.6 mEq/L (ref 3.5–5.1)
SODIUM: 140 meq/L (ref 135–145)
Total Bilirubin: 0.5 mg/dL (ref 0.2–1.2)
Total Protein: 5.3 g/dL — ABNORMAL LOW (ref 6.0–8.3)

## 2014-08-24 LAB — FERRITIN: Ferritin: 93 ng/mL (ref 22.0–322.0)

## 2014-08-24 LAB — IRON: IRON: 42 ug/dL (ref 42–165)

## 2014-08-24 NOTE — Telephone Encounter (Signed)
Called patient at [phone] [facility]) *Preferred* and left message for return call.

## 2014-08-24 NOTE — Telephone Encounter (Signed)
FYI

## 2014-08-24 NOTE — Telephone Encounter (Signed)
Recently admitted to the hospital with upper GI bleed, he came yesterday complaining of low blood pressure and not feeling better. BP was in the low side, he was still taking BP medications---->  we advise him to stop amlodipine. Blood work continue showing anemia, not improved since he left the hospital. Plan: Iron 325 mg one by mouth 3 times a day, call 90 and 3 RF Multivitamins daily Schedule a follow-up next week if not done already

## 2014-08-27 ENCOUNTER — Other Ambulatory Visit: Payer: Self-pay

## 2014-08-27 NOTE — Telephone Encounter (Signed)
Left a message for call back.    Pt has an appointment scheduled tomorrow, 08/28/14, at 11:30 am with Dr. Drue Novel.

## 2014-08-28 ENCOUNTER — Encounter: Payer: Self-pay | Admitting: Internal Medicine

## 2014-08-28 ENCOUNTER — Ambulatory Visit (INDEPENDENT_AMBULATORY_CARE_PROVIDER_SITE_OTHER): Payer: Medicare Other | Admitting: Internal Medicine

## 2014-08-28 ENCOUNTER — Telehealth: Payer: Self-pay

## 2014-08-28 VITALS — BP 124/78 | HR 87 | Temp 97.8°F | Ht 68.0 in | Wt 200.2 lb

## 2014-08-28 DIAGNOSIS — K922 Gastrointestinal hemorrhage, unspecified: Secondary | ICD-10-CM

## 2014-08-28 DIAGNOSIS — M869 Osteomyelitis, unspecified: Secondary | ICD-10-CM

## 2014-08-28 DIAGNOSIS — H5440 Blindness, one eye, unspecified eye: Secondary | ICD-10-CM

## 2014-08-28 DIAGNOSIS — D62 Acute posthemorrhagic anemia: Secondary | ICD-10-CM | POA: Diagnosis not present

## 2014-08-28 DIAGNOSIS — R609 Edema, unspecified: Secondary | ICD-10-CM

## 2014-08-28 LAB — BASIC METABOLIC PANEL
BUN: 17 mg/dL (ref 6–23)
CO2: 21 meq/L (ref 19–32)
Calcium: 8.7 mg/dL (ref 8.4–10.5)
Chloride: 114 mEq/L — ABNORMAL HIGH (ref 96–112)
Creatinine, Ser: 0.97 mg/dL (ref 0.40–1.50)
GFR: 80.64 mL/min (ref >60.00)
GLUCOSE: 89 mg/dL (ref 70–99)
POTASSIUM: 3.6 meq/L (ref 3.5–5.1)
Sodium: 141 mEq/L (ref 135–145)

## 2014-08-28 LAB — CBC WITH DIFFERENTIAL/PLATELET
BASOS PCT: 0.5 % (ref 0.0–3.0)
Basophils Absolute: 0 10*3/uL (ref 0.0–0.1)
EOS PCT: 1.9 % (ref 0.0–5.0)
Eosinophils Absolute: 0.1 10*3/uL (ref 0.0–0.7)
HCT: 22.5 % — CL (ref 39.0–52.0)
Hemoglobin: 7.2 g/dL — CL (ref 13.0–17.0)
LYMPHS PCT: 20.3 % (ref 12.0–46.0)
Lymphs Abs: 1.3 10*3/uL (ref 0.7–4.0)
MCHC: 32 g/dL (ref 30.0–36.0)
MCV: 93.2 fl (ref 78.0–100.0)
MONO ABS: 0.5 10*3/uL (ref 0.1–1.0)
Monocytes Relative: 8.1 % (ref 3.0–12.0)
Neutro Abs: 4.3 10*3/uL (ref 1.4–7.7)
Neutrophils Relative %: 69.2 % (ref 43.0–77.0)
Platelets: 286 10*3/uL (ref 150.0–400.0)
RDW: 19.6 % — ABNORMAL HIGH (ref 11.5–15.5)
WBC: 6.2 10*3/uL (ref 4.0–10.5)

## 2014-08-28 LAB — IRON: Iron: 25 ug/dL — ABNORMAL LOW (ref 42–165)

## 2014-08-28 LAB — FERRITIN: Ferritin: 63.1 ng/mL (ref 22.0–322.0)

## 2014-08-28 NOTE — Telephone Encounter (Signed)
Hemoglobin decrease again, paged GI on call, no answer yet. I spoke with the patient, aware of the results, he again reports poor iron tolerance. We'll discuss with GI first thing in the morning in the meantime patient knows to go to the ER if more bleeding, chest pain, difficulty breathing

## 2014-08-28 NOTE — Patient Instructions (Signed)
Go to the lab.

## 2014-08-28 NOTE — Telephone Encounter (Signed)
Hope with [facility] called with critical Hgb 7.2

## 2014-08-28 NOTE — Progress Notes (Signed)
Subjective:    Patient ID: Gene [name], male    DOB: 1940/04/25, 74 y.o.   MRN: [id number]  DOS:  08/28/2014 Type of visit - description : Hospital follow-up Interval history: Admitted to the hospital 08/15/2014 for 2 days Was admitted with melena, GI was consulted, upper endoscopy was performed, it showed minimal esophagitis, fresh blood at the proximal stomach, antral erosions, minimal proximal  gastritis erosions, normal duodenal bulb. A repeated endoscopy showed  Esophagus: There was a distal esophageal ulcer beginning at the level of the esophagogastric junction and extending upwards about 8 mm. See image 010. The rest of the esophagus looked normal. Stomach: Mild antral erythema. Small erosion in proximal antrum. Linear erosion in more proximal greater curvature which was very superficial. Duodenum: Normal No blood in the upper GI tract on this exam today. No stigmata of bleeding.  Bleeding was felt to be due to esophagitis due to doxycycline /NSAIDs. Aspirin was put on hold Patient was profoundly anemic, status post 2 PRBCs. Patient was orthostatic and was recommended to put blood pressure medicines on hold. He has a history of osteomyelitis, status post an amputation, was taking doxycycline but that was changed to IV abx and wound care consulted while @ the hospital  Review of Systems After he left the hospital, he continue to take amlodipine, BP was low on 08/23/2014 and he was recommended to stop amlodipine but again he didn't. Labs were done the same day: Kidney function stable, hemoglobin still   low at 8.3, he was recommended to start iron and multivitamins. At this point, he feels slightly better, not dizzy, less weak. No fever chills, appetite normal No abdominal pain, stools are back to normal, slightly darker since he started taking iron. Had diarrhea yesterday but again no blood in the stools. No dysphagia, odynophagia or heartburn.    Past Medical History    Diagnosis Date  . Anxiety and depression 1995    symptoms started aprox 1995, after a divorce  . Hyperlipidemia     high TG  . Hypertension   . Neuropathy   . 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 History of alcohol abuse   . PVD (peripheral vascular disease)     PVD, mild saw Dr Allyson Sabal 2012, intolerant to pletal, Rx observation  . Heart murmur     as a baby " outgrew"  . 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 Family history of anesthesia complication     "father would start seeing things"  . Anxiety   . Arthritis     Past Surgical History  Procedure Laterality Date  . Cholecystectomy    . Tonsillectomy    . Amputation Left 11/20/13 and 11/24/13    middle  . Amputation Left 01/03/2014    Procedure: Left Transmetatarsal Amputation;  Surgeon: Nadara Mustard, MD;  Location: [facility] OR;  Service: Orthopedics;  Laterality: Left;  . Transmetatarsal amputation Left 10/2013  . Colonoscopy    . Amputation Right 07/27/2014    Procedure: Right 4th Ray Amputation;  Surgeon: Nadara Mustard, MD;  Location: [facility] OR;  Service: Orthopedics;  Laterality: Right;  . Esophagogastroduodenoscopy N/A 08/16/2014    Procedure: ESOPHAGOGASTRODUODENOSCOPY (EGD);  Surgeon: Vida Rigger, MD;  Location: [facility] ENDOSCOPY;  Service: Endoscopy;  Laterality: N/A;  . Esophagogastroduodenoscopy (egd) with propofol N/A 08/17/2014    Procedure: ESOPHAGOGASTRODUODENOSCOPY (EGD) WITH PROPOFOL;  Surgeon: Graylin Shiver, MD;  Location: [facility] ENDOSCOPY;  Service: Endoscopy;  Laterality: N/A;  . Toe amputation 4th r  07-27-2014    History   Social History  . Marital Status: Divorced    Spouse Name: N/A  . Number of Children: 3  . Years of Education: College   Occupational History  . 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 Smoking status: Never Smoker   . Smokeless tobacco: Never Used  . Alcohol Use: 2.4 oz/week    0 Standard drinks or  equivalent, 4 Cans of beer per week     Comment: h/o abuse, now on weekends, slt heavier x last few months   . Drug Use: No  . Sexual Activity: Not on file   Other Topics Concern  . Not on file   Social History Narrative    Divorced since 1994; lives by himself, 3 sons, 1 GD  (all live close by)   [place] veteran          Medication List       This list is accurate as of: 08/28/14 11:59 PM.  Always use your most recent med list.               Alpha-Lipoic Acid 600 MG Caps  Take 1 capsule by mouth daily.     ALPRAZolam 0.5 MG tablet  Commonly known as:  XANAX  Take 1 tablet (0.5 mg total) by mouth 3 (three) times daily.     amLODipine 5 MG tablet  Commonly known as:  NORVASC  Take 1 tablet (5 mg total) by mouth daily.     atorvastatin 40 MG tablet  Commonly known as:  LIPITOR  Take 1 tablet (40 mg total) by mouth at bedtime.     b complex vitamins capsule  Take 1 capsule by mouth daily.     citalopram 20 MG tablet  Commonly known as:  CELEXA  Take 2 tablets (40 mg total) by mouth daily.     fenofibrate 160 MG tablet  Take 1 tablet (160 mg total) by mouth daily.     HYDROcodone-acetaminophen 5-325 MG per tablet  Commonly known as:  NORCO  Take 1 tablet by mouth every 6 (six) hours as needed.     Krill Oil 1000 MG Caps  Take 1,000 mg by mouth daily.     LYRICA 75 MG capsule  Generic drug:  pregabalin  Take 75 mg by mouth 2 (two) times daily.     METAMUCIL PO  Take 2 capsules by mouth daily.     oxymetazoline 0.05 % nasal spray  Commonly known as:  AFRIN  Place 1 spray into both nostrils 2 (two) times daily as needed for congestion.     pantoprazole 40 MG tablet  Commonly known as:  PROTONIX  Take 1 tablet (40 mg total) by mouth 2 (two) times daily.     sucralfate 1 GM/10ML suspension  Commonly known as:  CARAFATE  Take 10 mLs (1 g total) by mouth every 6 (six) hours.     traZODone 50 MG tablet  Commonly known as:  DESYREL  Take 25 mg by mouth  daily as needed for sleep.     VITAMIN B1-B12 PO  Take 2,000 mg by mouth 2 (two) times daily.     zolpidem 10 MG tablet  Commonly known as:  AMBIEN  Take 10 mg by mouth at bedtime as needed for sleep.  Objective:   Physical Exam BP 124/78 mmHg  Pulse 87  Temp(Src) 97.8 F (36.6 C) (Oral)  Ht  (1.727 m)  Wt 200 lb 4 oz (90.833 kg)  BMI 30.46 kg/m2  SpO2 97% General:   Well developed, well nourished . NAD.  HEENT:  Normocephalic . Face symmetric, atraumatic Lungs:  CTA B Normal respiratory effort, no intercostal retractions, no accessory muscle use. Heart: RRR,  Trace syst  murmur.  + edema R>L  pretibial   Abdomen:  Not distended, soft, non-tender. No rebound or rigidity. No mass,organomegaly Skin: Not pale. Not jaundice Neurologic:  alert & oriented X3.  Speech normal, gait appropriate for age and unassisted Psych--  Cognition and judgment appear intact.  Cooperative with normal attention span and concentration.  Behavior appropriate. No anxious or depressed appearing.       Assessment & Plan:    Addendum: Labs came back, anemia has worsened, case discussed with GI, they recommend IV iron and they will see him in few days, if he continued to be anemic he will have to be re-scope in few weeks .  Today , I spent more than  45  min with the patient: >50% of the time counseling regards the critical labs and coordinating his care.

## 2014-08-28 NOTE — Progress Notes (Signed)
Pre visit review using our clinic review tool, if applicable. No additional management support is needed unless otherwise documented below in the visit note. 

## 2014-08-29 ENCOUNTER — Encounter: Payer: Self-pay | Admitting: Internal Medicine

## 2014-08-29 ENCOUNTER — Telehealth: Payer: Self-pay | Admitting: Internal Medicine

## 2014-08-29 MED ORDER — SUCRALFATE 1 GM/10ML PO SUSP: 1.0000 g | Freq: Four times a day (QID) | ORAL | Status: DC

## 2014-08-29 NOTE — Telephone Encounter (Signed)
LMOM at [facility] short stay with Pt's name, DOB, MRN number, with information letting them know that Pt needs Feraheme Infusion 500 mg ASAP, and to repeat in one week. Gave them Dx code for GI bleed, anemia. Pt's home number and mobile number given as well as my return call number.

## 2014-08-29 NOTE — Assessment & Plan Note (Signed)
  Acute upper GI bleed Likely due to esophagitis and esophageal ulcer. Continue with PPIs, finish Carafate, will contact GI and see if he needs to be seen by them. He is now asymptomatic

## 2014-08-29 NOTE — Telephone Encounter (Signed)
Spoke with [facility] short [facility] informed it would be several weeks before they can schedule infusion. Informed to try [facility] at  [phone], was informed the earliest they could do was Monday 09/03/2014. Informed to try [facility] at [phone]. Spoke with Tiffany at [facility] and was informed they could schedule for Friday June 24. Tiffany requested Physician orders for FeraHeme 500 mg IV. Physician orders completed and faxed to Tiffany's attention at [phone]. Tiffany informed she would call Pt to set up appointment once she receives orders.    Received fax confirmation on 08/29/2014 at 1017. Physician order form sent for scanning.

## 2014-08-29 NOTE — Assessment & Plan Note (Signed)
  Profound anemia, due to GI bleed. Status post 2 PRBCs in the hospital, now on iron by mouth. Will check a CBC. If not improving consider other treatment options.

## 2014-08-29 NOTE — Telephone Encounter (Signed)
LMOM informing Barbara at Dr. Marlane Hatcher office that Pt is currently on crutches and can not get around very easily. Informed her that Pt was appt at the [facility] on Friday, August 31, 2014 and another on September 07, 2014. Informed her that Pt may be more willing to come in to OV if they are able to schedule anything on those days since the Pt will already be out on that day. Informed Britta Mccreedy to call back if she has any further questions.

## 2014-08-29 NOTE — Telephone Encounter (Signed)
FYI. Pt has infusion scheduled at [facility] on 08/31/2014 at 1200.

## 2014-08-29 NOTE — Assessment & Plan Note (Signed)
Patient reports new blindness of the left eye  after GI bleed, saw Dr Luciana Axe, it was felt to be due to acute anemia, they are hoping he will recuperate somewhat for his vision

## 2014-08-29 NOTE — Telephone Encounter (Signed)
Advise patient, I do recommend him to see GI and most definitely get the iron infusion

## 2014-08-29 NOTE — Telephone Encounter (Signed)
Caller name: Britta Mccreedy at Dr. Ewing Schlein office Relation to pt: Call back number: [phone] Pharmacy:  Reason for call:   States that she called patient to schedule appointment for Monday at 12.15. Patients states that he cannot get around good right now and states that he doesn't need to be seen so soon. Patient told Britta Mccreedy that he will call them back to schedule appointment.

## 2014-08-29 NOTE — Telephone Encounter (Addendum)
Spoke w/ Dr Randa Evens regards persistent anemia, plan: They will see the patient on follow-up, may need to repeat endoscopies Please arrange the following and short stay in [facility] or [facility]: Infusion of Feraheme 500 mg ASAP, repeat infusion in a week DDX GI bleed, anemia Please notify patient of above

## 2014-08-29 NOTE — Telephone Encounter (Signed)
Pt has infusion appt scheduled at [facility] on 08/31/2014 at 1200 to receive FeraHeme.

## 2014-08-29 NOTE — Assessment & Plan Note (Signed)
At baseline 

## 2014-08-29 NOTE — Assessment & Plan Note (Signed)
Osteomyelitis, Status post amputation of the right fourth toe on May, was taking doxycycline due to osteomyelitis, currently on no abx , doxycycline was stopped because he was felt to be the culprit of esophagitis.. Plans to see Dr. Lajoyce Corners again but at the same time would like a second opinion

## 2014-08-30 ENCOUNTER — Encounter (INDEPENDENT_AMBULATORY_CARE_PROVIDER_SITE_OTHER): Payer: Medicare Other | Admitting: Ophthalmology

## 2014-08-31 ENCOUNTER — Ambulatory Visit (HOSPITAL_COMMUNITY)
Admission: RE | Admit: 2014-08-31 | Discharge: 2014-08-31 | Disposition: A | Discharge status: Home / Self Care | Payer: Medicare Other | Source: Ambulatory Visit | Attending: Internal Medicine | Admitting: Internal Medicine

## 2014-08-31 DIAGNOSIS — D62 Acute posthemorrhagic anemia: Secondary | ICD-10-CM | POA: Insufficient documentation

## 2014-08-31 DIAGNOSIS — K922 Gastrointestinal hemorrhage, unspecified: Secondary | ICD-10-CM | POA: Diagnosis not present

## 2014-08-31 MED ORDER — SODIUM CHLORIDE 0.9 % IV SOLN
510.0000 mg | Freq: Once | INTRAVENOUS | Status: AC
  Administered 2014-08-31: 510 mg via INTRAVENOUS
  Filled 2014-08-31: qty 17

## 2014-08-31 NOTE — Procedures (Signed)
[facility]  Procedure Note  Gene [name] YME:[id number] DOB: 01-21-1941 DOA: 08/31/2014   PCP: [name], MD   Associated Diagnosis: Acute GI bleed (K92.2), Acute blood loss anemia (D62)  Procedure Note: IV infusion of feraheme   Condition During Procedure:  Pt tolerated well; pt observed for 30 minutes post infusion   Condition at Discharge:  No complications noted   Bluford Kaufmann, RN  [facility]

## 2014-09-04 ENCOUNTER — Telehealth: Payer: Self-pay | Admitting: Internal Medicine

## 2014-09-04 ENCOUNTER — Encounter: Payer: Self-pay | Admitting: Internal Medicine

## 2014-09-04 ENCOUNTER — Other Ambulatory Visit (HOSPITAL_COMMUNITY): Payer: Self-pay | Admitting: Orthopedic Surgery

## 2014-09-04 DIAGNOSIS — M869 Osteomyelitis, unspecified: Secondary | ICD-10-CM

## 2014-09-04 NOTE — Telephone Encounter (Signed)
Caller name: Rosana FretWilliam Staib Relationship to patient: self Can be reached: [phone], cell #  Reason for call: Pt called in stating he has to have surgery on his right foot to remove all his toes. He is concerned as he is getting iron infusions due to low hemoglobin. He wants to make you aware of the surgery and is asking if he needs to see you prior to surgery (scheduled for 09/12/2014). Pt was scheduled for echocardiogram 09/13/14 and is calling to cancel that appt. Please advise.

## 2014-09-04 NOTE — Telephone Encounter (Signed)
FYI

## 2014-09-05 NOTE — Telephone Encounter (Signed)
Patient is concerned about the upcoming surgery for  the right transmetatarsal amputation reportedly due to osteomyelitis, so I spoke with Dr. Lajoyce Cornersuda, one of the barriers for care is the fact that the patient is putting weight on his feet sooner than recommended.  I suggested an ID consult and orthopedic agrees. I spoke with the patient and encouraged him to: - proceed with a echocardiogram as recommended - be very careful with weightbearing after surgery -he is aware of the ID referral. -I also encouraged him to get the iron infusion.  Will check on him after surgery, consider reassessment by cardiology due to h/o peripheral vascular disease

## 2014-09-06 ENCOUNTER — Encounter: Payer: Self-pay | Admitting: Internal Medicine

## 2014-09-06 DIAGNOSIS — D62 Acute posthemorrhagic anemia: Secondary | ICD-10-CM

## 2014-09-06 NOTE — Telephone Encounter (Signed)
PATIENT IS CALLING TO ADVISE DR PAZ THAT HIS HEMOGLOBIN HAS DROPPED 10 POINTS FROM June 16 TO June 21.  HE WOULD LIKE DR PAZ TO ORDER A CHECK OF HIS HEMOGLOBIN EITHER TODAY [facility]ORROW [facility]  PLEASE CALL THE PATIENT AND ADVISE   NUMBER IS [phone]

## 2014-09-06 NOTE — Telephone Encounter (Signed)
CBC w/ diff ordered. Instructions added to appt notes for Pt's infusion appt tomorrow.

## 2014-09-06 NOTE — Telephone Encounter (Signed)
-----   Message from Wanda PlumpJose E Paz, MD sent at 09/06/2014  1:32 PM EDT ----- Regarding: See patient's message See patient's message, please asked the nurse doing the infusion tomorrow to get a [facility]

## 2014-09-07 ENCOUNTER — Ambulatory Visit (HOSPITAL_COMMUNITY)
Admission: RE | Admit: 2014-09-07 | Discharge: 2014-09-07 | Disposition: A | Discharge status: Home / Self Care | Payer: Medicare Other | Source: Ambulatory Visit | Attending: Internal Medicine | Admitting: Internal Medicine

## 2014-09-07 DIAGNOSIS — K922 Gastrointestinal hemorrhage, unspecified: Secondary | ICD-10-CM | POA: Insufficient documentation

## 2014-09-07 DIAGNOSIS — D62 Acute posthemorrhagic anemia: Secondary | ICD-10-CM | POA: Diagnosis not present

## 2014-09-07 LAB — CBC WITH DIFFERENTIAL/PLATELET
BASOS ABS: 0 10*3/uL (ref 0.0–0.1)
Basophils Relative: 0 % (ref 0–1)
Eosinophils Absolute: 0.1 10*3/uL (ref 0.0–0.7)
Eosinophils Relative: 1 % (ref 0–5)
HEMATOCRIT: 27.7 % — AB (ref 39.0–52.0)
Hemoglobin: 8.9 g/dL — ABNORMAL LOW (ref 13.0–17.0)
LYMPHS ABS: 1.1 10*3/uL (ref 0.7–4.0)
LYMPHS PCT: 21 % (ref 12–46)
MCH: 30.7 pg (ref 26.0–34.0)
MCHC: 32.1 g/dL (ref 30.0–36.0)
MCV: 95.5 fL (ref 78.0–100.0)
Monocytes Absolute: 0.7 10*3/uL (ref 0.1–1.0)
Monocytes Relative: 15 % — ABNORMAL HIGH (ref 3–12)
NEUTROS ABS: 3.1 10*3/uL (ref 1.7–7.7)
Neutrophils Relative %: 63 % (ref 43–77)
Platelets: 338 10*3/uL (ref 150–400)
RBC: 2.9 MIL/uL — ABNORMAL LOW (ref 4.22–5.81)
RDW: 18.4 % — AB (ref 11.5–15.5)
WBC: 5 10*3/uL (ref 4.0–10.5)

## 2014-09-07 MED ORDER — SODIUM CHLORIDE 0.9 % IV SOLN
510.0000 mg | Freq: Once | INTRAVENOUS | Status: AC
  Administered 2014-09-07: 510 mg via INTRAVENOUS
  Filled 2014-09-07: qty 17

## 2014-09-07 NOTE — Procedures (Signed)
Associated Diagnosis: Acute GI bleed K92.2 and Acute blood loss anemia D62 MD: [name]  Procedure Note IV infusion feraheme, labs drawn Condition during procedure: Tolerated well Condition after procedure: Alert, oriented, and ambulatory

## 2014-09-11 ENCOUNTER — Encounter (HOSPITAL_COMMUNITY): Payer: Self-pay | Admitting: *Deleted

## 2014-09-11 MED ORDER — ONDANSETRON HCL 4 MG/2ML IJ SOLN: 4.0000 mg | Freq: Once | INTRAMUSCULAR | Status: DC | PRN

## 2014-09-11 MED ORDER — OXYCODONE HCL 5 MG/5ML PO SOLN: 5.0000 mg | Freq: Once | ORAL | Status: DC | PRN

## 2014-09-11 MED ORDER — FENTANYL CITRATE (PF) 100 MCG/2ML IJ SOLN: 25.0000 ug | INTRAMUSCULAR | Status: DC | PRN

## 2014-09-11 MED ORDER — OXYCODONE HCL 5 MG PO TABS: 5.0000 mg | ORAL_TABLET | Freq: Once | ORAL | Status: DC | PRN

## 2014-09-11 NOTE — Progress Notes (Signed)
Hemoglobin  on 7/1 was 8.9, past received the 2nd of 2 Iron infusions.  Patient reported that Dr [name] noted a small heart murmer and patient is to have an Echo, but it will be rescheduled since it was scheduled for 7/6/ also. I notified Dr Noreene LarssonJoslin of latest Hemoglobin and the small heart murmer, he ordered a type and screen.

## 2014-09-12 ENCOUNTER — Inpatient Hospital Stay (HOSPITAL_COMMUNITY): Payer: Medicare Other | Admitting: Anesthesiology

## 2014-09-12 ENCOUNTER — Encounter (HOSPITAL_COMMUNITY): Payer: Self-pay | Admitting: Surgery

## 2014-09-12 ENCOUNTER — Ambulatory Visit (HOSPITAL_COMMUNITY)
Admission: RE | Admit: 2014-09-12 | Discharge: 2014-09-13 | Disposition: A | Discharge status: Home / Self Care | Payer: Medicare Other | Source: Ambulatory Visit | Attending: Orthopedic Surgery | Admitting: Orthopedic Surgery

## 2014-09-12 ENCOUNTER — Encounter (HOSPITAL_COMMUNITY)
Admission: RE | Disposition: A | Discharge status: Home / Self Care | Payer: Self-pay | Source: Ambulatory Visit | Attending: Orthopedic Surgery

## 2014-09-12 DIAGNOSIS — M869 Osteomyelitis, unspecified: Secondary | ICD-10-CM | POA: Diagnosis not present

## 2014-09-12 DIAGNOSIS — I10 Essential (primary) hypertension: Secondary | ICD-10-CM | POA: Insufficient documentation

## 2014-09-12 DIAGNOSIS — I96 Gangrene, not elsewhere classified: Secondary | ICD-10-CM | POA: Diagnosis not present

## 2014-09-12 DIAGNOSIS — E785 Hyperlipidemia, unspecified: Secondary | ICD-10-CM | POA: Diagnosis not present

## 2014-09-12 DIAGNOSIS — I739 Peripheral vascular disease, unspecified: Secondary | ICD-10-CM | POA: Diagnosis not present

## 2014-09-12 DIAGNOSIS — F329 Major depressive disorder, single episode, unspecified: Secondary | ICD-10-CM | POA: Insufficient documentation

## 2014-09-12 DIAGNOSIS — Y929 Unspecified place or not applicable: Secondary | ICD-10-CM | POA: Insufficient documentation

## 2014-09-12 DIAGNOSIS — F419 Anxiety disorder, unspecified: Secondary | ICD-10-CM | POA: Insufficient documentation

## 2014-09-12 DIAGNOSIS — Z89439 Acquired absence of unspecified foot: Secondary | ICD-10-CM

## 2014-09-12 DIAGNOSIS — T8130XA Disruption of wound, unspecified, initial encounter: Secondary | ICD-10-CM | POA: Diagnosis present

## 2014-09-12 DIAGNOSIS — G5621 Lesion of ulnar nerve, right upper limb: Secondary | ICD-10-CM | POA: Diagnosis not present

## 2014-09-12 DIAGNOSIS — Y839 Surgical procedure, unspecified as the cause of abnormal reaction of the patient, or of later complication, without mention of misadventure at the time of the procedure: Secondary | ICD-10-CM | POA: Diagnosis not present

## 2014-09-12 DIAGNOSIS — G5622 Lesion of ulnar nerve, left upper limb: Secondary | ICD-10-CM | POA: Insufficient documentation

## 2014-09-12 HISTORY — DX: Ulcer of esophagus without bleeding: K22.10

## 2014-09-12 HISTORY — PX: AMPUTATION: SHX166

## 2014-09-12 HISTORY — DX: Personal history of other medical treatment: Z92.89

## 2014-09-12 LAB — COMPREHENSIVE METABOLIC PANEL
ALT: 10 U/L — AB (ref 17–63)
AST: 15 U/L (ref 15–41)
Albumin: 3 g/dL — ABNORMAL LOW (ref 3.5–5.0)
Alkaline Phosphatase: 28 U/L — ABNORMAL LOW (ref 38–126)
Anion gap: 8 (ref 5–15)
BILIRUBIN TOTAL: 0.5 mg/dL (ref 0.3–1.2)
BUN: 15 mg/dL (ref 6–20)
CHLORIDE: 112 mmol/L — AB (ref 101–111)
CO2: 21 mmol/L — ABNORMAL LOW (ref 22–32)
Calcium: 8.8 mg/dL — ABNORMAL LOW (ref 8.9–10.3)
Creatinine, Ser: 1.11 mg/dL (ref 0.61–1.24)
GFR calc Af Amer: >60 mL/min (ref >60)
GFR calc non Af Amer: >60 mL/min (ref >60)
GLUCOSE: 93 mg/dL (ref 65–99)
Potassium: 3.9 mmol/L (ref 3.5–5.1)
Sodium: 141 mmol/L (ref 135–145)
Total Protein: 5.7 g/dL — ABNORMAL LOW (ref 6.5–8.1)

## 2014-09-12 LAB — CBC
HCT: 31 % — ABNORMAL LOW (ref 39.0–52.0)
Hemoglobin: 9.7 g/dL — ABNORMAL LOW (ref 13.0–17.0)
MCH: 29.9 pg (ref 26.0–34.0)
MCHC: 31.3 g/dL (ref 30.0–36.0)
MCV: 95.7 fL (ref 78.0–100.0)
PLATELETS: 364 10*3/uL (ref 150–400)
RBC: 3.24 MIL/uL — AB (ref 4.22–5.81)
RDW: 17.7 % — ABNORMAL HIGH (ref 11.5–15.5)
WBC: 5.4 10*3/uL (ref 4.0–10.5)

## 2014-09-12 LAB — APTT: APTT: 28 s (ref 24–37)

## 2014-09-12 LAB — PROTIME-INR
INR: 1.29 (ref 0.00–1.49)
PROTHROMBIN TIME: 16.2 s — AB (ref 11.6–15.2)

## 2014-09-12 LAB — TYPE AND SCREEN
ABO/RH(D): O POS
Antibody Screen: NEGATIVE

## 2014-09-12 SURGERY — AMPUTATION, FOOT, PARTIAL
Anesthesia: General | Laterality: Right

## 2014-09-12 MED ORDER — ONDANSETRON HCL 4 MG/2ML IJ SOLN
INTRAMUSCULAR | Status: DC | PRN
  Administered 2014-09-12: 4 mg via INTRAVENOUS

## 2014-09-12 MED ORDER — TRAZODONE HCL 50 MG PO TABS: 50.0000 mg | ORAL_TABLET | Freq: Every day | ORAL | Status: DC | PRN

## 2014-09-12 MED ORDER — PROPOFOL 10 MG/ML IV BOLUS
INTRAVENOUS | Status: DC | PRN
  Administered 2014-09-12: 200 mg via INTRAVENOUS

## 2014-09-12 MED ORDER — ALPRAZOLAM 0.5 MG PO TABS
0.5000 mg | ORAL_TABLET | Freq: Three times a day (TID) | ORAL | Status: DC
  Administered 2014-09-12 – 2014-09-13 (×2): 0.5 mg via ORAL
  Filled 2014-09-12 (×3): qty 1

## 2014-09-12 MED ORDER — ONDANSETRON HCL 4 MG PO TABS: 4.0000 mg | ORAL_TABLET | Freq: Four times a day (QID) | ORAL | Status: DC | PRN

## 2014-09-12 MED ORDER — METHOCARBAMOL 1000 MG/10ML IJ SOLN
500.0000 mg | Freq: Four times a day (QID) | INTRAVENOUS | Status: DC | PRN
  Filled 2014-09-12: qty 5

## 2014-09-12 MED ORDER — METOCLOPRAMIDE HCL 5 MG/ML IJ SOLN: 5.0000 mg | Freq: Three times a day (TID) | INTRAMUSCULAR | Status: DC | PRN

## 2014-09-12 MED ORDER — ACETAMINOPHEN 325 MG PO TABS: 650.0000 mg | ORAL_TABLET | Freq: Four times a day (QID) | ORAL | Status: DC | PRN

## 2014-09-12 MED ORDER — ONDANSETRON HCL 4 MG/2ML IJ SOLN: 4.0000 mg | Freq: Four times a day (QID) | INTRAMUSCULAR | Status: DC | PRN

## 2014-09-12 MED ORDER — LIDOCAINE HCL (CARDIAC) 20 MG/ML IV SOLN
INTRAVENOUS | Status: AC
  Filled 2014-09-12: qty 5

## 2014-09-12 MED ORDER — CEFAZOLIN SODIUM 1-5 GM-% IV SOLN
1.0000 g | Freq: Four times a day (QID) | INTRAVENOUS | Status: AC
  Administered 2014-09-12 – 2014-09-13 (×3): 1 g via INTRAVENOUS
  Filled 2014-09-12 (×3): qty 50

## 2014-09-12 MED ORDER — OXYCODONE HCL 5 MG PO TABS
5.0000 mg | ORAL_TABLET | ORAL | Status: DC | PRN
  Administered 2014-09-12 – 2014-09-13 (×4): 10 mg via ORAL
  Filled 2014-09-12 (×4): qty 2

## 2014-09-12 MED ORDER — METHOCARBAMOL 500 MG PO TABS
500.0000 mg | ORAL_TABLET | Freq: Four times a day (QID) | ORAL | Status: DC | PRN
  Filled 2014-09-12: qty 1

## 2014-09-12 MED ORDER — PANTOPRAZOLE SODIUM 40 MG PO TBEC
40.0000 mg | DELAYED_RELEASE_TABLET | Freq: Two times a day (BID) | ORAL | Status: DC
  Administered 2014-09-12 – 2014-09-13 (×3): 40 mg via ORAL
  Filled 2014-09-12 (×3): qty 1

## 2014-09-12 MED ORDER — CITALOPRAM HYDROBROMIDE 40 MG PO TABS
40.0000 mg | ORAL_TABLET | Freq: Every day | ORAL | Status: DC
  Administered 2014-09-12 – 2014-09-13 (×2): 40 mg via ORAL
  Filled 2014-09-12 (×2): qty 1

## 2014-09-12 MED ORDER — FENTANYL CITRATE (PF) 100 MCG/2ML IJ SOLN
INTRAMUSCULAR | Status: DC | PRN
  Administered 2014-09-12: 50 ug via INTRAVENOUS

## 2014-09-12 MED ORDER — SUCCINYLCHOLINE CHLORIDE 20 MG/ML IJ SOLN
INTRAMUSCULAR | Status: AC
  Filled 2014-09-12: qty 1

## 2014-09-12 MED ORDER — HYDROMORPHONE HCL 1 MG/ML IJ SOLN
1.0000 mg | INTRAMUSCULAR | Status: DC | PRN
  Administered 2014-09-12 – 2014-09-13 (×3): 1 mg via INTRAVENOUS
  Filled 2014-09-12 (×3): qty 1

## 2014-09-12 MED ORDER — HYDROMORPHONE HCL 1 MG/ML IJ SOLN
INTRAMUSCULAR | Status: AC
  Filled 2014-09-12: qty 1

## 2014-09-12 MED ORDER — HYDROMORPHONE HCL 1 MG/ML IJ SOLN: 0.2500 mg | INTRAMUSCULAR | Status: DC | PRN

## 2014-09-12 MED ORDER — LACTATED RINGERS IV SOLN
INTRAVENOUS | Status: DC
  Administered 2014-09-12: 10:00:00 via INTRAVENOUS

## 2014-09-12 MED ORDER — LIDOCAINE HCL (CARDIAC) 20 MG/ML IV SOLN
INTRAVENOUS | Status: DC | PRN
  Administered 2014-09-12: 60 mg via INTRAVENOUS

## 2014-09-12 MED ORDER — PROMETHAZINE HCL 25 MG/ML IJ SOLN: 6.2500 mg | INTRAMUSCULAR | Status: DC | PRN

## 2014-09-12 MED ORDER — FENTANYL CITRATE (PF) 250 MCG/5ML IJ SOLN
INTRAMUSCULAR | Status: AC
  Filled 2014-09-12: qty 5

## 2014-09-12 MED ORDER — METOCLOPRAMIDE HCL 5 MG PO TABS: 5.0000 mg | ORAL_TABLET | Freq: Three times a day (TID) | ORAL | Status: DC | PRN

## 2014-09-12 MED ORDER — PROPOFOL 10 MG/ML IV BOLUS
INTRAVENOUS | Status: AC
  Filled 2014-09-12: qty 20

## 2014-09-12 MED ORDER — PHENYLEPHRINE HCL 10 MG/ML IJ SOLN
INTRAMUSCULAR | Status: DC | PRN
  Administered 2014-09-12: 80 ug via INTRAVENOUS

## 2014-09-12 MED ORDER — ONDANSETRON HCL 4 MG/2ML IJ SOLN
INTRAMUSCULAR | Status: AC
  Filled 2014-09-12: qty 2

## 2014-09-12 MED ORDER — SUCRALFATE 1 GM/10ML PO SUSP
1.0000 g | Freq: Four times a day (QID) | ORAL | Status: DC
  Administered 2014-09-12 – 2014-09-13 (×3): 1 g via ORAL
  Filled 2014-09-12 (×8): qty 10

## 2014-09-12 MED ORDER — OXYCODONE HCL 5 MG/5ML PO SOLN: 5.0000 mg | Freq: Once | ORAL | Status: AC | PRN

## 2014-09-12 MED ORDER — OXYCODONE HCL 5 MG PO TABS
ORAL_TABLET | ORAL | Status: AC
  Filled 2014-09-12: qty 1

## 2014-09-12 MED ORDER — ATORVASTATIN CALCIUM 40 MG PO TABS
40.0000 mg | ORAL_TABLET | Freq: Every day | ORAL | Status: DC
Start: 2014-09-12 — End: 2014-09-13
  Administered 2014-09-12: 40 mg via ORAL
  Filled 2014-09-12: qty 1

## 2014-09-12 MED ORDER — 0.9 % SODIUM CHLORIDE (POUR BTL) OPTIME
TOPICAL | Status: DC | PRN
  Administered 2014-09-12: 1000 mL

## 2014-09-12 MED ORDER — ACETAMINOPHEN 650 MG RE SUPP: 650.0000 mg | Freq: Four times a day (QID) | RECTAL | Status: DC | PRN

## 2014-09-12 MED ORDER — SODIUM CHLORIDE 0.9 % IV SOLN
INTRAVENOUS | Status: DC
  Administered 2014-09-12: 13:00:00 via INTRAVENOUS

## 2014-09-12 MED ORDER — PREGABALIN 75 MG PO CAPS
75.0000 mg | ORAL_CAPSULE | Freq: Two times a day (BID) | ORAL | Status: DC
  Administered 2014-09-12 – 2014-09-13 (×2): 75 mg via ORAL
  Filled 2014-09-12 (×2): qty 1

## 2014-09-12 MED ORDER — ASPIRIN EC 325 MG PO TBEC: 325.0000 mg | DELAYED_RELEASE_TABLET | Freq: Every day | ORAL | Status: DC

## 2014-09-12 MED ORDER — ZOLPIDEM TARTRATE 5 MG PO TABS: 5.0000 mg | ORAL_TABLET | Freq: Every evening | ORAL | Status: DC | PRN

## 2014-09-12 MED ORDER — CEFAZOLIN SODIUM-DEXTROSE 2-3 GM-% IV SOLR
2.0000 g | INTRAVENOUS | Status: AC
  Administered 2014-09-12: 2 g via INTRAVENOUS
  Filled 2014-09-12: qty 50

## 2014-09-12 MED ORDER — OXYCODONE HCL 5 MG PO TABS
5.0000 mg | ORAL_TABLET | Freq: Once | ORAL | Status: AC | PRN
  Administered 2014-09-12: 5 mg via ORAL

## 2014-09-12 MED ORDER — FENOFIBRATE 160 MG PO TABS
160.0000 mg | ORAL_TABLET | Freq: Every day | ORAL | Status: DC
  Administered 2014-09-13: 160 mg via ORAL
  Filled 2014-09-12: qty 1

## 2014-09-12 MED ORDER — AMLODIPINE BESYLATE 5 MG PO TABS
5.0000 mg | ORAL_TABLET | Freq: Every day | ORAL | Status: DC
  Administered 2014-09-13: 5 mg via ORAL
  Filled 2014-09-12: qty 1

## 2014-09-12 SURGICAL SUPPLY — 36 items
BLADE SAW SGTL HD 18.5X60.5X1. (BLADE) ×3 IMPLANT
BLADE SURG 10 STRL SS (BLADE) IMPLANT
BNDG COHESIVE 4X5 TAN STRL (GAUZE/BANDAGES/DRESSINGS) ×3 IMPLANT
BNDG GAUZE ELAST 4 BULKY (GAUZE/BANDAGES/DRESSINGS) ×3 IMPLANT
COVER SURGICAL LIGHT HANDLE (MISCELLANEOUS) ×6 IMPLANT
DRAPE U-SHAPE 47X51 STRL (DRAPES) ×3 IMPLANT
DRSG ADAPTIC 3X8 NADH LF (GAUZE/BANDAGES/DRESSINGS) ×3 IMPLANT
DRSG PAD ABDOMINAL 8X10 ST (GAUZE/BANDAGES/DRESSINGS) ×3 IMPLANT
DURAPREP 26ML APPLICATOR (WOUND CARE) ×3 IMPLANT
ELECT REM PT RETURN 9FT ADLT (ELECTROSURGICAL) ×3
ELECTRODE REM PT RTRN 9FT ADLT (ELECTROSURGICAL) ×1 IMPLANT
GAUZE SPONGE 4X4 12PLY STRL (GAUZE/BANDAGES/DRESSINGS) ×3 IMPLANT
GLOVE BIOGEL PI IND STRL 6.5 (GLOVE) ×2 IMPLANT
GLOVE BIOGEL PI IND STRL 7.5 (GLOVE) ×1 IMPLANT
GLOVE BIOGEL PI IND STRL 9 (GLOVE) ×1 IMPLANT
GLOVE BIOGEL PI INDICATOR 6.5 (GLOVE) ×4
GLOVE BIOGEL PI INDICATOR 7.5 (GLOVE) ×2
GLOVE BIOGEL PI INDICATOR 9 (GLOVE) ×2
GLOVE SURG ORTHO 9.0 STRL STRW (GLOVE) ×3 IMPLANT
GLOVE SURG SS PI 6.5 STRL IVOR (GLOVE) ×3 IMPLANT
GOWN STRL REUS W/ TWL LRG LVL3 (GOWN DISPOSABLE) ×2 IMPLANT
GOWN STRL REUS W/ TWL XL LVL3 (GOWN DISPOSABLE) ×3 IMPLANT
GOWN STRL REUS W/TWL LRG LVL3 (GOWN DISPOSABLE) ×4
GOWN STRL REUS W/TWL XL LVL3 (GOWN DISPOSABLE) ×6
KIT BASIN OR (CUSTOM PROCEDURE TRAY) ×3 IMPLANT
KIT ROOM TURNOVER OR (KITS) ×3 IMPLANT
NS IRRIG 1000ML POUR BTL (IV SOLUTION) ×3 IMPLANT
PACK ORTHO EXTREMITY (CUSTOM PROCEDURE TRAY) ×3 IMPLANT
PAD ARMBOARD 7.5X6 YLW CONV (MISCELLANEOUS) ×6 IMPLANT
SPONGE GAUZE 4X4 12PLY STER LF (GAUZE/BANDAGES/DRESSINGS) ×3 IMPLANT
SPONGE LAP 18X18 X RAY DECT (DISPOSABLE) ×3 IMPLANT
SUT ETHILON 2 0 PSLX (SUTURE) ×9 IMPLANT
SUT VIC AB 2-0 CTB1 (SUTURE) IMPLANT
TOWEL OR 17X24 6PK STRL BLUE (TOWEL DISPOSABLE) ×3 IMPLANT
TOWEL OR 17X26 10 PK STRL BLUE (TOWEL DISPOSABLE) ×3 IMPLANT
WATER STERILE IRR 1000ML POUR (IV SOLUTION) ×3 IMPLANT

## 2014-09-12 NOTE — Anesthesia Preprocedure Evaluation (Signed)
Anesthesia Evaluation  Patient identified by MRN, date of birth, ID band Patient awake    Airway Mallampati: II  TM Distance: >3 FB Neck ROM: Full    Dental  (+) Teeth Intact, Dental Advisory Given   Pulmonary neg pulmonary ROS,  breath sounds clear to auscultation        Cardiovascular hypertension, Pt. on medications + Peripheral Vascular Disease Rhythm:Regular Rate:Normal     Neuro/Psych Anxiety  Neuromuscular disease    GI/Hepatic negative GI ROS, (+)     substance abuse  alcohol use,   Endo/Other  Morbid obesity  Renal/GU      Musculoskeletal  (+) Arthritis -,   Abdominal (+) + obese,   Peds  Hematology  (+) anemia ,   Anesthesia Other Findings   Reproductive/Obstetrics                             Anesthesia Physical  Anesthesia Plan  ASA: III  Anesthesia Plan: General   Post-op Pain Management:    Induction: Intravenous  Airway Management Planned: LMA  Additional Equipment:   Intra-op Plan:   Post-operative Plan: Extubation in OR  Informed Consent: I have reviewed the patients History and Physical, chart, labs and discussed the procedure including the risks, benefits and alternatives for the proposed anesthesia with the patient or authorized representative who has indicated his/her understanding and acceptance.   Dental advisory given  Plan Discussed with: Anesthesiologist, Surgeon and CRNA  Anesthesia Plan Comments:         Anesthesia Quick Evaluation

## 2014-09-12 NOTE — Transfer of Care (Signed)
Immediate Anesthesia Transfer of Care Note  Patient: Gene FretWilliam Broadwater  Procedure(s) Performed: Procedure(s): RIGHT TRANSMETATARSAL AMPUTATION (Right)  Patient Location: PACU  Anesthesia Type:General  Level of Consciousness: awake, alert  and oriented  Airway & Oxygen Therapy: Patient Spontanous Breathing  Post-op Assessment: Report given to RN and Post -op Vital signs reviewed and stable  Post vital signs: Reviewed and stable  Last Vitals:  Filed Vitals:   09/12/14 0915  BP: 114/62  Pulse: 79  Temp: 36.8 C  Resp: 18    Complications: No apparent anesthesia complications

## 2014-09-12 NOTE — Anesthesia Procedure Notes (Signed)
Procedure Name: LMA Insertion Date/Time: 09/12/2014 10:38 AM Performed by: Arlice ColtMANESS, Astaria Nanez B Pre-anesthesia Checklist: Patient identified, Emergency Drugs available, Suction available, Patient being monitored and Timeout performed Patient Re-evaluated:Patient Re-evaluated prior to inductionOxygen Delivery Method: Circle system utilized Preoxygenation: Pre-oxygenation with 100% oxygen Intubation Type: IV induction LMA: LMA flexible inserted LMA Size: 4.0 Number of attempts: 1 Placement Confirmation: positive ETCO2 and breath sounds checked- equal and bilateral Tube secured with: Tape Dental Injury: Teeth and Oropharynx as per pre-operative assessment

## 2014-09-12 NOTE — Anesthesia Postprocedure Evaluation (Signed)
  Anesthesia Post-op Note  Patient: Gene FretWilliam [name]  Procedure(s) Performed: Procedure(s): RIGHT TRANSMETATARSAL AMPUTATION (Right)  Patient Location: PACU  Anesthesia Type:General  Level of Consciousness: awake and alert   Airway and Oxygen Therapy: Patient Spontanous Breathing  Post-op Pain: mild  Post-op Assessment: Post-op Vital signs reviewed              Post-op Vital Signs: Reviewed  Last Vitals:  Filed Vitals:   09/12/14 1200  BP: 113/63  Pulse: 68  Temp: 37 C  Resp: 12    Complications: No apparent anesthesia complications

## 2014-09-12 NOTE — Op Note (Signed)
09/12/2014  11:07 AM  PATIENT:  Gene [name]    PRE-OPERATIVE DIAGNOSIS:  dehiscence right foot  POST-OPERATIVE DIAGNOSIS:  Same  PROCEDURE:  RIGHT TRANSMETATARSAL AMPUTATION  SURGEON:  Nadara MustardUDA,MARCUS V, MD  PHYSICIAN ASSISTANT:None ANESTHESIA:   General  PREOPERATIVE INDICATIONS:  Gene [name] is a  74 y.o. male with a diagnosis of dehiscence right foot who failed conservative measures and elected for surgical management.    The risks benefits and alternatives were discussed with the patient preoperatively including but not limited to the risks of infection, bleeding, nerve injury, cardiopulmonary complications, the need for revision surgery, among others, and the patient was willing to proceed.  OPERATIVE IMPLANTS: None  OPERATIVE FINDINGS: Calcified vessels minimal microcirculation  OPERATIVE PROCEDURE: Patient was brought to the operating room and underwent a general and aesthetic. After adequate levels anesthesia were obtained patient's right lower extremity was prepped using DuraPrep draped into a sterile field. A timeout was called. A fishmouth incision was made just proximal to the ischemic tissue. A precipitating saw was used to perform a transmetatarsal amputation. The bone edges were beveled. Electrocautery was used for hemostasis. The wound was irrigated with normal saline. Incision was closed using 2-0 nylon. A compressive dressing was applied. Patient was extubated taken to the PACU in stable condition.

## 2014-09-12 NOTE — Evaluation (Signed)
Physical Therapy Evaluation Patient Details Name: Gene FretWilliam [name] MRN: [id number] DOB: [birth year] Today's Date: 09/12/2014   History of Present Illness  Patient is a 74 year old gentleman with peripheral vascular disease status post left transmetatarsal amputation who presents with progressive gangrenous changes of the right foot status post fourth ray amputation. Now presents for right transmet amp  Clinical Impression  Pt admitted with above diagnosis. Pt currently with functional limitations due to the deficits listed below (see PT Problem List). Pt currently struggling to hop on left LE to keep RLE fully NWB, question whether he could put heel down in post op shoe which would increase safety with mobility. Pt will benefit from skilled PT to increase their independence and safety with mobility to allow discharge to the venue listed below.       Follow Up Recommendations Home health PT;Supervision - Intermittent    Equipment Recommendations  None recommended by PT    Recommendations for Other Services OT consult     Precautions / Restrictions Precautions Precautions: Fall Required Braces or Orthoses: Other Brace/Splint Other Brace/Splint: post op shoe Restrictions Weight Bearing Restrictions: Yes RUE Weight Bearing: Non weight bearing      Mobility  Bed Mobility Overal bed mobility: Independent                Transfers Overall transfer level: Needs assistance Equipment used: Rolling walker (2 wheeled) Transfers: Sit to/from Stand Sit to Stand: Min guard         General transfer comment: vc's for hand placement, pt tends to move quickly and somewhat impulsively with sitting, vc's for safety awareness. Improved with trials.  Ambulation/Gait Ambulation/Gait assistance: Min assist Ambulation Distance (Feet): 3 Feet Assistive device: Rolling walker (2 wheeled) Gait Pattern/deviations: Step-to pattern Gait velocity: decreased Gait velocity interpretation: <1.8  ft/sec, indicative of risk for recurrent falls General Gait Details: 3' fwd and 3' back. Pt unsteady and struggled to hop on left foot to keep right fully NWB. Would be easier if he could place right heel on floor.   Stairs            Wheelchair Mobility    Modified Rankin (Stroke Patients Only)       Balance Overall balance assessment: Needs assistance Sitting-balance support: Feet supported Sitting balance-Leahy Scale: Normal     Standing balance support: Bilateral upper extremity supported Standing balance-Leahy Scale: Poor Standing balance comment: due to NWB right, unable to stand safely on left leg only without UE support                             Pertinent Vitals/Pain Pain Assessment: Faces Faces Pain Scale: Hurts whole lot Pain Location: right foot Pain Intervention(s): Limited activity within patient's tolerance;Repositioned;Monitored during session    Home Living Family/patient expects to be discharged to:: Private residence Living Arrangements: Alone Available Help at Discharge: Neighbor;Available PRN/intermittently Type of Home: House Home Access: Level entry     Home Layout: Multi-level Home Equipment: Walker - 2 wheels;Crutches Additional Comments: home is trilevel with RW on one floor, and crutches on 2 others. Pt has been sitting down for stairs and has been doing this effectively without using RLE while trying to get it to heal    Prior Function Level of Independence: Independent         Comments: pt can get a ride to grocery store and then rides electric cart     Hand Dominance   Dominant Hand: Right  Extremity/Trunk Assessment   Upper Extremity Assessment: Overall WFL for tasks assessed           Lower Extremity Assessment: LLE deficits/detail;RLE deficits/detail RLE Deficits / Details: pt able to lift leg against gravity, not further assessed due to NWB status LLE Deficits / Details: transmet amp, has prosthetic  shoe for that foot  Cervical / Trunk Assessment: Normal  Communication   Communication: No difficulties  Cognition Arousal/Alertness: Awake/alert Behavior During Therapy: WFL for tasks assessed/performed Overall Cognitive Status: Within Functional Limits for tasks assessed                      General Comments General comments (skin integrity, edema, etc.): pt does not appear to have much family support at home. Asked if anyone could come stay with him for a few nights and first stated "no". He does have granddaughter who is 57 who will be with him sometimes.     Exercises        Assessment/Plan    PT Assessment Patient needs continued PT services  PT Diagnosis Difficulty walking;Abnormality of gait;Acute pain   PT Problem List Decreased balance;Decreased mobility;Pain;Decreased knowledge of precautions;Decreased safety awareness  PT Treatment Interventions DME instruction;Gait training;Stair training;Functional mobility training;Therapeutic activities;Therapeutic exercise;Balance training;Patient/family education   PT Goals (Current goals can be found in the Care Plan section) Acute Rehab PT Goals Patient Stated Goal: return home PT Goal Formulation: With patient Time For Goal Achievement: 09/19/14 Potential to Achieve Goals: Good    Frequency Min 5X/week   Barriers to discharge Decreased caregiver support      Co-evaluation               End of Session Equipment Utilized During Treatment: Gait belt Activity Tolerance: Patient tolerated treatment well Patient left: in bed;with bed alarm set;with call bell/phone within reach Nurse Communication: Mobility status    Functional Assessment Tool Used: clinical judgement Functional Limitation: Mobility: Walking and moving around Mobility: Walking and Moving Around Current Status (K7425): At least 1 percent but less than 20 percent impaired, limited or restricted Mobility: Walking and Moving Around Goal Status  [phone]): 0 percent impaired, limited or restricted    Time: 7564-3329 PT Time Calculation (min) (ACUTE ONLY): 33 min   Charges:   PT Evaluation $Initial PT Evaluation Tier I: 1 Procedure PT Treatments $Therapeutic Activity: 8-22 mins   PT G Codes:   PT G-Codes **NOT FOR INPATIENT CLASS** Functional Assessment Tool Used: clinical judgement Functional Limitation: Mobility: Walking and moving around Mobility: Walking and Moving Around Current Status (J1884): At least 1 percent but less than 20 percent impaired, limited or restricted Mobility: Walking and Moving Around Goal Status [phone]): 0 percent impaired, limited or restricted  Lyanne Co, PT  Acute Rehab Services  [phone]   Lyanne Co 09/12/2014, 4:45 PM

## 2014-09-12 NOTE — H&P (Signed)
Gene [name] is an 74 y.o. male.   Chief Complaint: Dehiscence right foot fourth Ray amputation HPI: Patient is a 74 year old gentleman with peripheral vascular disease status post left transmetatarsal amputation who presents with progressive gangrenous changes of the right foot status post fourth ray amputation  Past Medical History  Diagnosis Date  . Anxiety and depression 1995    symptoms started aprox 1995, after a divorce  . Hyperlipidemia     high TG  . Hypertension   . Neuropathy   . 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 History of alcohol abuse   . 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 Family history of anesthesia complication     "father would start seeing things"  . Anxiety   . Arthritis   . Ulcer of esophagus   . Heart murmur     as a baby " outgrew" " small one per Dr [name].  . PVD (peripheral vascular disease)     PVD, mild saw Dr Allyson SabalBerry 2012, intolerant to pletal, Rx observation  . History of blood transfusion     Past Surgical History  Procedure Laterality Date  . Cholecystectomy    . Tonsillectomy    . Amputation Left 11/20/13 and 11/24/13    middle  . Amputation Left 01/03/2014    Procedure: Left Transmetatarsal Amputation;  Surgeon: Nadara MustardMarcus Duda V, MD;  [facility];  Service: Orthopedics;  Laterality: Left;  . Transmetatarsal amputation Left 10/2013  . Colonoscopy    . Amputation Right 07/27/2014    Procedure: Right 4th Ray Amputation;  Surgeon: Nadara MustardMarcus Duda V, MD;  [facility];  Service: Orthopedics;  Laterality: Right;  . Esophagogastroduodenoscopy N/A 08/16/2014    Procedure: ESOPHAGOGASTRODUODENOSCOPY (EGD);  Surgeon: Vida RiggerMarc Magod, MD;  [facility];  Service: Endoscopy;  Laterality: N/A;  . Esophagogastroduodenoscopy (egd) with propofol N/A 08/17/2014    Procedure: ESOPHAGOGASTRODUODENOSCOPY (EGD) WITH PROPOFOL;  Surgeon: Graylin ShiverSalem F Ganem, MD;  [facility];  Service: Endoscopy;  Laterality: N/A;   . Toe amputation 4th r  07-27-2014    Family History  Problem Relation Age of Onset  . Hyperlipidemia Mother   . Lung cancer Father     asbestos releated  . Colon polyps Father   . Diabetes Neg Hx   . Coronary artery disease Neg Hx   . Prostate cancer Neg Hx    Social History:  reports that he has never smoked. He has never used smokeless tobacco. He reports that he does not drink alcohol or use illicit drugs.  Allergies: No Known Allergies  No prescriptions prior to admission    No results found for this or any previous visit (from the past 48 hour(s)). No results found.  Review of Systems  All other systems reviewed and are negative.   There were no vitals taken for this visit. Physical Exam   On examination patient has progressive gangrenous changes of the fourth ray amputation surgical incision with wound dehiscence. Assessment/Plan Assessment: Peripheral vascular disease with dehiscence of fourth ray amputation.  Plan: We will proceed with a transmetatarsal amputation. Discussed with the patient risk of the wound not healing need for higher level amputation. Patient states he understands and wishes to proceed at this time.  DUDA,MARCUS V 09/12/2014, 7:35 AM

## 2014-09-13 ENCOUNTER — Other Ambulatory Visit (HOSPITAL_COMMUNITY): Payer: Medicare Other

## 2014-09-13 ENCOUNTER — Encounter (HOSPITAL_COMMUNITY): Payer: Self-pay | Admitting: Orthopedic Surgery

## 2014-09-13 DIAGNOSIS — T8130XA Disruption of wound, unspecified, initial encounter: Secondary | ICD-10-CM | POA: Diagnosis not present

## 2014-09-13 MED ORDER — OXYCODONE-ACETAMINOPHEN 5-325 MG PO TABS: 1.0000 | ORAL_TABLET | ORAL | Status: DC | PRN

## 2014-09-13 NOTE — Discharge Summary (Signed)
Physician Discharge Summary  Patient ID: Gene FretWilliam [name] MRN: [id number] DOB/AGE: March 26, 1940 74 y.o.  Admit date: 09/12/2014 Discharge date: 09/13/2014  Admission Diagnoses: Osteomyelitis right foot with wound dehiscence  Discharge Diagnoses:  Active Problems:   S/P transmetatarsal amputation of foot   Discharged Condition: stable  Hospital Course: Patient's hospital course was essentially unremarkable. He stayed overnight and was discharged to home in stable condition.  Consults: None  Significant Diagnostic Studies: labs: Routine labs  Treatments: surgery: See operative note  Discharge Exam: Blood pressure 112/58, pulse 89, temperature 98 F (36.7 C), temperature source Oral, resp. rate 16, height 5\' 8"  (1.727 m), weight 90.719 kg (200 lb), SpO2 95 %. Incision/Wound: dressing clean and dry  Disposition: 01-Home or Self Care     Medication List    ASK your doctor about these medications        acetaminophen 325 MG tablet  Commonly known as:  TYLENOL  Take 650 mg by mouth every 6 (six) hours as needed (pain).     Alpha-Lipoic Acid 600 MG Caps  Take 1 capsule by mouth daily.     ALPRAZolam 0.5 MG tablet  Commonly known as:  XANAX  Take 1 tablet (0.5 mg total) by mouth 3 (three) times daily.     amLODipine 5 MG tablet  Commonly known as:  NORVASC  Take 1 tablet (5 mg total) by mouth daily.     atorvastatin 40 MG tablet  Commonly known as:  LIPITOR  Take 1 tablet (40 mg total) by mouth at bedtime.     b complex vitamins capsule  Take 1 capsule by mouth daily.     citalopram 20 MG tablet  Commonly known as:  CELEXA  Take 2 tablets (40 mg total) by mouth daily.     fenofibrate 160 MG tablet  Take 1 tablet (160 mg total) by mouth daily.     HYDROcodone-acetaminophen 5-325 MG per tablet  Commonly known as:  NORCO  Take 1 tablet by mouth every 6 (six) hours as needed.     Krill Oil 1000 MG Caps  Take 1,000 mg by mouth daily.     LYRICA 75 MG capsule   Generic drug:  pregabalin  Take 75 mg by mouth 2 (two) times daily.     METAMUCIL PO  Take 2 capsules by mouth daily.     oxymetazoline 0.05 % nasal spray  Commonly known as:  AFRIN  Place 1 spray into both nostrils 2 (two) times daily as needed for congestion.     pantoprazole 40 MG tablet  Commonly known as:  PROTONIX  Take 1 tablet (40 mg total) by mouth 2 (two) times daily.     sucralfate 1 GM/10ML suspension  Commonly known as:  CARAFATE  Take 10 mLs (1 g total) by mouth every 6 (six) hours.     traZODone 50 MG tablet  Commonly known as:  DESYREL  Take 50 mg by mouth daily as needed for sleep.     VITAMIN B1-B12 PO  Take 2,000 mg by mouth 2 (two) times daily.     zolpidem 10 MG tablet  Commonly known as:  AMBIEN  Take 10 mg by mouth at bedtime as needed for sleep.           Follow-up Information    Follow up with DUDA,MARCUS V, MD In 2 weeks.   Specialty:  Orthopedic Surgery   Contact information:   [address] [place] [address] [phone]  Signed: DUDA,MARCUS V 09/13/2014, 7:14 AM

## 2014-09-13 NOTE — Progress Notes (Signed)
Physical Therapy Treatment Patient Details Name: Gene [name] MRN: [id number] DOB: 1940-08-23 Today's Date: 09/13/2014    History of Present Illness Patient is a 74 year old gentleman with peripheral vascular disease status post left transmetatarsal amputation who presents with progressive gangrenous changes of the right foot status post fourth ray amputation. Now presents for right transmet amp    PT Comments    POD # 1 amb pt twice.  First time with B crutches per pt request.  Very unsteady, near fall and inability to maintain NWB status R LE.  Amb second time still unsteady but better able to maintain NWB R LE. Pt lives home alone in a tri level house.  He stated he plans to scoot on his buttocks as he did prior and have intermittent family assist.   Follow Up Recommendations  Home health PT     Equipment Recommendations       Recommendations for Other Services       Precautions / Restrictions Precautions Precautions: Fall Required Braces or Orthoses: Other Brace/Splint Other Brace/Splint: post op shoe Restrictions Weight Bearing Restrictions: Yes RLE Weight Bearing: Non weight bearing    Mobility  Bed Mobility Overal bed mobility: Modified Independent             General bed mobility comments: increased time  Transfers Overall transfer level: Needs assistance Equipment used: Rolling walker (2 wheeled);Crutches Transfers: Sit to/from Stand Sit to Stand: Min guard;Min assist         General transfer comment: vc's for hand placement, pt tends to move quickly and somewhat impulsively with sitting, vc's for safety awareness.   Ambulation/Gait Ambulation/Gait assistance: Mod assist;Min assist Ambulation Distance (Feet): 20 Feet (10 feet x 2 ) Assistive device: Rolling walker (2 wheeled);Crutches Gait Pattern/deviations: Step-to pattern;Decreased stance time - right Gait velocity: decreased   General Gait Details: amb twice.  Firast time with crutches per pt  request.  very unsteady.  Near fall.  Unable to maintain NWB.  Amb second time with walker.  Still unsteady but better and able to maintain NWB 90% of time.  Still very difficult to safely amb either way with current NWB status.     Stairs Stairs: Yes       General stair comments: pt has 3 levels and stated he plans to scoot up and down on his buttocks as he did before when opposite foot was partially amputated.   Wheelchair Mobility    Modified Rankin (Stroke Patients Only)       Balance                                    Cognition Arousal/Alertness: Awake/alert Behavior During Therapy: WFL for tasks assessed/performed Overall Cognitive Status: Within Functional Limits for tasks assessed                      Exercises      General Comments        Pertinent Vitals/Pain Pain Assessment: 0-10 Pain Score: 5  Pain Location: R foot esp down in dependent position Pain Descriptors / Indicators: Constant Pain Intervention(s): Monitored during session;Repositioned    Home Living                      Prior Function            PT Goals (current goals can now be found in the care plan  section) Progress towards PT goals: Progressing toward goals    Frequency  Min 5X/week    PT Plan      Co-evaluation             End of Session Equipment Utilized During Treatment: Gait belt Activity Tolerance: Patient tolerated treatment well Patient left: in bed;with bed alarm set;with call bell/phone within reach (R LE elevated)     Time: [id number] PT Time Calculation (min) (ACUTE ONLY): 25 min  Charges:  $Gait Training: 8-22 mins $Therapeutic Activity: 8-22 mins                    G Codes:      Gene ShellingLori Alexsandria [name]  PTA [facility]  Acute  Rehab Pager      [phone]

## 2014-09-13 NOTE — Progress Notes (Signed)
Orthopedic Tech Progress Note Patient Details:  Rosana FretWilliam Dimaio 09/16/1940 [id number]  Ortho Devices Type of Ortho Device: Postop shoe/boot Ortho Device/Splint Location: RLE Ortho Device/Splint Interventions: Application   Asia R Thompson 09/13/2014, 10:37 AM

## 2014-09-15 ENCOUNTER — Emergency Department (HOSPITAL_BASED_OUTPATIENT_CLINIC_OR_DEPARTMENT_OTHER)
Admission: EM | Admit: 2014-09-15 | Discharge: 2014-09-15 | Disposition: A | Discharge status: Home / Self Care | Payer: Medicare Other | Attending: Emergency Medicine | Admitting: Emergency Medicine

## 2014-09-15 ENCOUNTER — Encounter (HOSPITAL_BASED_OUTPATIENT_CLINIC_OR_DEPARTMENT_OTHER): Payer: Self-pay | Admitting: Emergency Medicine

## 2014-09-15 ENCOUNTER — Emergency Department (HOSPITAL_BASED_OUTPATIENT_CLINIC_OR_DEPARTMENT_OTHER): Payer: Medicare Other

## 2014-09-15 DIAGNOSIS — F329 Major depressive disorder, single episode, unspecified: Secondary | ICD-10-CM | POA: Insufficient documentation

## 2014-09-15 DIAGNOSIS — R011 Cardiac murmur, unspecified: Secondary | ICD-10-CM | POA: Insufficient documentation

## 2014-09-15 DIAGNOSIS — M1990 Unspecified osteoarthritis, unspecified site: Secondary | ICD-10-CM | POA: Insufficient documentation

## 2014-09-15 DIAGNOSIS — F419 Anxiety disorder, unspecified: Secondary | ICD-10-CM | POA: Insufficient documentation

## 2014-09-15 DIAGNOSIS — R609 Edema, unspecified: Secondary | ICD-10-CM | POA: Diagnosis not present

## 2014-09-15 DIAGNOSIS — Z89421 Acquired absence of other right toe(s): Secondary | ICD-10-CM | POA: Diagnosis not present

## 2014-09-15 DIAGNOSIS — Z8719 Personal history of other diseases of the digestive system: Secondary | ICD-10-CM | POA: Diagnosis not present

## 2014-09-15 DIAGNOSIS — I10 Essential (primary) hypertension: Secondary | ICD-10-CM | POA: Diagnosis not present

## 2014-09-15 DIAGNOSIS — M791 Myalgia: Secondary | ICD-10-CM | POA: Insufficient documentation

## 2014-09-15 DIAGNOSIS — Z79899 Other long term (current) drug therapy: Secondary | ICD-10-CM | POA: Diagnosis not present

## 2014-09-15 DIAGNOSIS — R531 Weakness: Secondary | ICD-10-CM | POA: Insufficient documentation

## 2014-09-15 DIAGNOSIS — Z8619 Personal history of other infectious and parasitic diseases: Secondary | ICD-10-CM | POA: Insufficient documentation

## 2014-09-15 DIAGNOSIS — G629 Polyneuropathy, unspecified: Secondary | ICD-10-CM | POA: Insufficient documentation

## 2014-09-15 LAB — BASIC METABOLIC PANEL
Anion gap: 12 (ref 5–15)
BUN: 19 mg/dL (ref 6–20)
CHLORIDE: 108 mmol/L (ref 101–111)
CO2: 20 mmol/L — ABNORMAL LOW (ref 22–32)
CREATININE: 1.1 mg/dL (ref 0.61–1.24)
Calcium: 9 mg/dL (ref 8.9–10.3)
GFR calc non Af Amer: >60 mL/min (ref >60)
Glucose, Bld: 101 mg/dL — ABNORMAL HIGH (ref 65–99)
Potassium: 3.5 mmol/L (ref 3.5–5.1)
Sodium: 140 mmol/L (ref 135–145)

## 2014-09-15 LAB — CBC WITH DIFFERENTIAL/PLATELET
BASOS PCT: 0 % (ref 0–1)
Basophils Absolute: 0 10*3/uL (ref 0.0–0.1)
Eosinophils Absolute: 0 10*3/uL (ref 0.0–0.7)
Eosinophils Relative: 0 % (ref 0–5)
HEMATOCRIT: 30.9 % — AB (ref 39.0–52.0)
HEMOGLOBIN: 9.8 g/dL — AB (ref 13.0–17.0)
Lymphocytes Relative: 7 % — ABNORMAL LOW (ref 12–46)
Lymphs Abs: 0.7 10*3/uL (ref 0.7–4.0)
MCH: 30.5 pg (ref 26.0–34.0)
MCHC: 31.7 g/dL (ref 30.0–36.0)
MCV: 96.3 fL (ref 78.0–100.0)
MONO ABS: 1.2 10*3/uL — AB (ref 0.1–1.0)
MONOS PCT: 13 % — AB (ref 3–12)
Neutro Abs: 7.7 10*3/uL (ref 1.7–7.7)
Neutrophils Relative %: 80 % — ABNORMAL HIGH (ref 43–77)
Platelets: 351 10*3/uL (ref 150–400)
RBC: 3.21 MIL/uL — ABNORMAL LOW (ref 4.22–5.81)
RDW: 16.2 % — ABNORMAL HIGH (ref 11.5–15.5)
WBC: 9.7 10*3/uL (ref 4.0–10.5)

## 2014-09-15 LAB — TROPONIN I
TROPONIN I: 0.1 ng/mL — AB (ref <0.031)
Troponin I: 0.13 ng/mL — ABNORMAL HIGH (ref <0.031)

## 2014-09-15 MED ORDER — IOHEXOL 350 MG/ML SOLN
100.0000 mL | Freq: Once | INTRAVENOUS | Status: AC | PRN
  Administered 2014-09-15: 100 mL via INTRAVENOUS

## 2014-09-15 NOTE — Discharge Instructions (Signed)
Call your cardiologist on Monday to arrange an expedited follow-up.  Return to the emergency department if you experience any chest pain, difficulty breathing, or other new and concerning symptoms.   Weakness Weakness is a lack of strength. It may be felt all over the body (generalized) or in one specific part of the body (focal). Some causes of weakness can be serious. You may need further medical evaluation, especially if you are elderly or you have a history of immunosuppression (such as chemotherapy or HIV), kidney disease, heart disease, or diabetes. CAUSES  Weakness can be caused by many different things, including:  Infection.  Physical exhaustion.  Internal bleeding or other blood loss that results in a lack of red blood cells (anemia).  Dehydration. This cause is more common in elderly people.  Side effects or electrolyte abnormalities from medicines, such as pain medicines or sedatives.  Emotional distress, anxiety, or depression.  Circulation problems, especially severe peripheral arterial disease.  Heart disease, such as rapid atrial fibrillation, bradycardia, or heart failure.  Nervous system disorders, such as Guillain-Barr syndrome, multiple sclerosis, or stroke. DIAGNOSIS  To find the cause of your weakness, your caregiver will take your history and perform a physical exam. Lab tests or X-rays may also be ordered, if needed. TREATMENT  Treatment of weakness depends on the cause of your symptoms and can vary greatly. HOME CARE INSTRUCTIONS   Rest as needed.  Eat a well-balanced diet.  Try to get some exercise every day.  Only take over-the-counter or prescription medicines as directed by your caregiver. SEEK MEDICAL CARE IF:   Your weakness seems to be getting worse or spreads to other parts of your body.  You develop new aches or pains. SEEK IMMEDIATE MEDICAL CARE IF:   You cannot perform your normal daily activities, such as getting dressed and feeding  yourself.  You cannot walk up and down stairs, or you feel exhausted when you do so.  You have shortness of breath or chest pain.  You have difficulty moving parts of your body.  You have weakness in only one area of the body or on only one side of the body.  You have a fever.  You have trouble speaking or swallowing.  You cannot control your bladder or bowel movements.  You have black or bloody vomit or stools. MAKE SURE YOU:  Understand these instructions.  Will watch your condition.  Will get help right away if you are not doing well or get worse. Document Released: 02/23/2005 Document Revised: 08/25/2011 Document Reviewed: 04/24/2011 [facility] Patient Information 2015 [employer] [place]. This information is not intended to replace advice given to you by your health care provider. Make sure you discuss any questions you have with your health care provider.

## 2014-09-15 NOTE — ED Notes (Signed)
Pt in c/o weakness since esophageal surgery x 3 weeks ago. Pt lost blood during surgery, received transfusions and supplemental iron outpatient, but still feels weak. Pt is pale and jittery in triage, tachycardic with irregular rate. Pt is alert, oriented, and answering questions appropriately.

## 2014-09-15 NOTE — ED Notes (Signed)
Pt's heart rate frequently spikes from 80s/90s to 130s/140s then back down within a few seconds -- EDP aware (no EKG needed). Pt asymptomatic; monitor tracings printed and placed in pt's chart. Danna HeftyGolden, Enjoli Tidd Lee, RN

## 2014-09-15 NOTE — ED Provider Notes (Signed)
CSN: [id number]   Arrival date & time 09/15/14 1452  History  This chart was scribed for  Geoffery Lyonsouglas Laraine Samet, MD by Bethel BornBritney McCollum, ED Scribe. This patient was seen in room MH12/MH12 and the patient's care was started at 4:10 PM.  Chief Complaint  Patient presents with  . Weakness    HPI Patient is a 74 y.o. male presenting with weakness. The history is provided by the patient and a relative. No language interpreter was used.  Weakness This is a new problem. The current episode started yesterday. The problem occurs constantly. The problem has not changed since onset.Pertinent negatives include no chest pain, no abdominal pain and no shortness of breath. Nothing aggravates the symptoms. Nothing relieves the symptoms. Gene [name] has tried nothing for the symptoms.   Gene [name] is a 74 y.o. male who presents to the Emergency Department complaining of generalized weakness that increased yesterday. Pt notes slipping and falling from bed yesterday with associated left knee pain. Gene [name] states that Gene [name] was unable to get back in bed after several attempts yesterday. Associated symptoms include myalgias that Gene [name] describes as soreness. The toes of his right foot were amputated 3 days ago. At that time Gene [name] was offered physical therapy but declined.  Pt denies fever,chest pain, palpitations, SOB, and cough. Pt denies significant cardiac history. No anticoagulation.   Past Medical History  Diagnosis Date  . Anxiety and depression 1995    symptoms started aprox 1995, after a divorce  . Hyperlipidemia     high TG  . Hypertension   . Neuropathy   . 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 History of alcohol abuse   . 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 Family history of anesthesia complication     "father would start seeing things"  . Anxiety   . Arthritis   . Ulcer of esophagus   . Heart murmur     as a baby " outgrew" " small one per Dr [name].  . PVD (peripheral vascular  disease)     PVD, mild saw Dr Allyson SabalBerry 2012, intolerant to pletal, Rx observation  . History of blood transfusion     Past Surgical History  Procedure Laterality Date  . Cholecystectomy    . Tonsillectomy    . Amputation Left 11/20/13 and 11/24/13    middle  . Amputation Left 01/03/2014    Procedure: Left Transmetatarsal Amputation;  Surgeon: Nadara MustardMarcus Duda V, MD;  [facility];  Service: Orthopedics;  Laterality: Left;  . Transmetatarsal amputation Left 10/2013  . Colonoscopy    . Amputation Right 07/27/2014    Procedure: Right 4th Ray Amputation;  Surgeon: Nadara MustardMarcus Duda V, MD;  [facility];  Service: Orthopedics;  Laterality: Right;  . Esophagogastroduodenoscopy N/A 08/16/2014    Procedure: ESOPHAGOGASTRODUODENOSCOPY (EGD);  Surgeon: Vida RiggerMarc Magod, MD;  [facility];  Service: Endoscopy;  Laterality: N/A;  . Esophagogastroduodenoscopy (egd) with propofol N/A 08/17/2014    Procedure: ESOPHAGOGASTRODUODENOSCOPY (EGD) WITH PROPOFOL;  Surgeon: Graylin ShiverSalem F Ganem, MD;  [facility];  Service: Endoscopy;  Laterality: N/A;  . Toe amputation 4th r  07-27-2014  . Transmetatarsal amputation Right 07/06/02016  . Amputation Right 09/12/2014    Procedure: RIGHT TRANSMETATARSAL AMPUTATION;  Surgeon: Nadara MustardMarcus Duda V, MD;  [facility];  Service: Orthopedics;  Laterality: Right;    Family History  Problem Relation Age of Onset  . Hyperlipidemia Mother   . Lung cancer Father  asbestos releated  . Colon polyps Father   . Diabetes Neg Hx   . Coronary artery disease Neg Hx   . Prostate cancer Neg Hx     History  Substance Use Topics  . Smoking status: Never Smoker   . Smokeless tobacco: Never Used  . Alcohol Use: No     Comment: h/o abuse, now on weekends, slt heavier x last few months   States no alcohol     Review of Systems  Constitutional: Negative for fever and chills.  Respiratory: Negative for cough and shortness of breath.   Cardiovascular: Negative for chest pain and  palpitations.  Gastrointestinal: Negative for abdominal pain.  Musculoskeletal: Positive for myalgias.  Neurological: Positive for weakness. Negative for dizziness, focal weakness and loss of consciousness.  All other systems reviewed and are negative.   Home Medications   Prior to Admission medications   Medication Sig Start Date End Date Taking? Authorizing Provider  acetaminophen (TYLENOL) 325 MG tablet Take 650 mg by mouth every 6 (six) hours as needed (pain).    Historical Provider, MD  Alpha-Lipoic Acid 600 MG CAPS Take 1 capsule by mouth daily.    Historical Provider, MD  ALPRAZolam Prudy Feeler) 0.5 MG tablet Take 1 tablet (0.5 mg total) by mouth 3 (three) times daily. 08/16/14   [name], MD  amLODipine (NORVASC) 5 MG tablet Take 1 tablet (5 mg total) by mouth daily. 07/20/14   [name], MD  atorvastatin (LIPITOR) 40 MG tablet Take 1 tablet (40 mg total) by mouth at bedtime. 07/23/14   [name], MD  b complex vitamins capsule Take 1 capsule by mouth daily.    Historical Provider, MD  citalopram (CELEXA) 20 MG tablet Take 2 tablets (40 mg total) by mouth daily. 08/24/14   [name], MD  fenofibrate 160 MG tablet Take 1 tablet (160 mg total) by mouth daily. 08/24/14   [name], MD  HYDROcodone-acetaminophen (NORCO) 5-325 MG per tablet Take 1 tablet by mouth every 6 (six) hours as needed. Patient not taking: Reported on 08/28/2014 07/27/14   Nadara Mustard, MD  Krill Oil 1000 MG CAPS Take 1,000 mg by mouth daily.     Historical Provider, MD  LYRICA 75 MG capsule Take 75 mg by mouth 2 (two) times daily. 06/24/14   Historical Provider, MD  oxyCODONE-acetaminophen (ROXICET) 5-325 MG per tablet Take 1 tablet by mouth every 4 (four) hours as needed for severe pain. 09/13/14   Nadara Mustard, MD  oxymetazoline (AFRIN) 0.05 % nasal spray Place 1 spray into both nostrils 2 (two) times daily as needed for congestion.    Historical Provider, MD  pantoprazole (PROTONIX) 40 MG tablet Take 1 tablet (40 mg  total) by mouth 2 (two) times daily. 08/17/14   Richarda Overlie, MD  Psyllium (METAMUCIL PO) Take 2 capsules by mouth daily.    Historical Provider, MD  sucralfate (CARAFATE) 1 GM/10ML suspension Take 10 mLs (1 g total) by mouth every 6 (six) hours. 08/29/14   [name], MD  traZODone (DESYREL) 50 MG tablet Take 50 mg by mouth daily as needed for sleep.     Historical Provider, MD  VITAMIN B1-B12 PO Take 2,000 mg by mouth 2 (two) times daily.     Historical Provider, MD  zolpidem (AMBIEN) 10 MG tablet Take 10 mg by mouth at bedtime as needed for sleep.    Historical Provider, MD    Allergies  Review of patient's  allergies indicates no known allergies.  Triage Vitals: BP 131/63 mmHg  Pulse 134  Temp(Src) 98.3 F (36.8 C) (Oral)  Resp 19  SpO2 91%  Physical Exam  Constitutional: Gene [name] is oriented to person, place, and time and well-developed, well-nourished, and in no distress. No distress.  HENT:  Head: Normocephalic and atraumatic.  Eyes: Pupils are equal, round, and reactive to light.  Neck: Normal range of motion. Neck supple.  Cardiovascular: Normal rate, regular rhythm and normal heart sounds.   No murmur heard. Pulmonary/Chest: Effort normal and breath sounds normal.  Abdominal: Soft. Bowel sounds are normal. Gene [name] exhibits no distension. There is no tenderness.  Musculoskeletal: Normal range of motion. Gene [name] exhibits edema.  1+ pitting edema bilaterally.  The right foot has the surgical dressing in place. There is no purulent drainage or bleeding on the dressing material. There is no redness or warmth extending up the leg.   Neurological: Gene [name] is alert and oriented to person, place, and time.  Skin: Skin is warm and dry. Gene [name] is not diaphoretic.  Nursing note and vitals reviewed.   ED Course  Procedures   DIAGNOSTIC STUDIES: Oxygen Saturation is 91% on RA, normal by my interpretation.    COORDINATION OF CARE: 4:23 PM Discussed treatment plan which includes lab work, EKG, and CT angio  chest with pt at bedside and pt agreed to plan.  5:41 PM-Consult complete with Dr. Tarri Glenn (Cardiology). Patient case explained and discussed. Plan for second troponin. Call ended at 5:49 PM.  6:08 PM I re-evaluated the patient and provided an update on the treatment plan including my conversation with Dr. Tarri Glenn.  7:21 PM I updated Dr. Tarri Glenn on the result of the second troponin.      Labs Review-  Labs Reviewed  BASIC METABOLIC PANEL - Abnormal; Notable for the following:    CO2 20 (*)    Glucose, Bld 101 (*)    All other components within normal limits  CBC WITH DIFFERENTIAL/PLATELET - Abnormal; Notable for the following:    RBC 3.21 (*)    Hemoglobin 9.8 (*)    HCT 30.9 (*)    RDW 16.2 (*)    Neutrophils Relative % 80 (*)    Lymphocytes Relative 7 (*)    Monocytes Relative 13 (*)    Monocytes Absolute 1.2 (*)    All other components within normal limits  TROPONIN I - Abnormal; Notable for the following:    Troponin I 0.13 (*)    All other components within normal limits    Imaging Review No results found.  EKG Interpretation  Date/Time:  Saturday September 15 2014 15:16:09 EDT Ventricular Rate:  92 PR Interval:  132 QRS Duration: 96 QT Interval:  378 QTC Calculation: 467 R Axis:   71 Text Interpretation:  Sinus rhythm with Premature atrial complexes Otherwise normal ECG Confirmed by Enyla Lisbon  MD, Kani Chauvin ([id number]) on 09/15/2014 4:29:11 PM         MDM   Final diagnoses:  Weakness     Patient presents here with complaints of weakness. Gene [name] is one-week status post toe amputation with concurrent GI bleed requiring transfusion. His workup today reveals no significant abnormality within the exception of a mildly elevated troponin of 0.13. While on the monitor, Gene [name] did have several runs of what appears to be PAT which were sustained for several minutes, then resolved. The patient was asymptomatic during these episodes and denies any chest pain or shortness of breath. Due to the  recent surgery, mild  elevation of troponin, and PAT, a chest CT was obtained which revealed no evidence for pulmonary embolism.  I discussed the results of these tests and troponin with Dr. Tarri Glenn from cardiology who does not feel as though that troponin is significant given the lack of chest pain or shortness of breath. The patient is adamantly against staying in the hospital and is insistent upon discharge. Gene [name] does have an appointment in the near future at the cardiology clinic for an echocardiogram. I have advised him to call to move this appointment up. Gene [name] also understands to return to the ER if his symptoms significantly worsen or change.   I personally performed the services described in this documentation, which was scribed in my presence. The recorded information has been reviewed and is accurate.    Geoffery Lyons, MD 09/15/14 [phone]

## 2014-09-20 ENCOUNTER — Other Ambulatory Visit: Payer: Self-pay | Admitting: Internal Medicine

## 2014-09-20 NOTE — Telephone Encounter (Signed)
Ok 30 and 3 RF 

## 2014-09-20 NOTE — Telephone Encounter (Signed)
Rx sent to pharmacy   

## 2014-09-20 NOTE — Telephone Encounter (Signed)
Pt is requesting refill on Trazodone.  Last OV: 08/28/2014 Last Fill: 05/11/2014 #[phone]  Please advise.

## 2014-09-26 ENCOUNTER — Encounter: Payer: Self-pay | Admitting: Internal Medicine

## 2014-09-26 ENCOUNTER — Other Ambulatory Visit (HOSPITAL_COMMUNITY): Payer: Self-pay | Admitting: Orthopedic Surgery

## 2014-09-27 ENCOUNTER — Telehealth: Payer: Self-pay | Admitting: Behavioral Health

## 2014-09-27 ENCOUNTER — Encounter (HOSPITAL_COMMUNITY): Payer: Self-pay | Admitting: *Deleted

## 2014-09-27 MED ORDER — CEFAZOLIN SODIUM-DEXTROSE 2-3 GM-% IV SOLR
2.0000 g | INTRAVENOUS | Status: AC
  Administered 2014-09-28: 2 g via INTRAVENOUS
  Filled 2014-09-27: qty 50

## 2014-09-27 NOTE — Progress Notes (Signed)
Pt states his Hgb was up to 9.8 on 09/15/14 and he is feeling better, eating red meat daily.

## 2014-09-27 NOTE — Telephone Encounter (Signed)
Patient went for a checkup after a right metatarsal  amputation and was told that due to poor circulation he will need a below knee amputation, right side. Patient states that "I was told my circulation was okay", I review the last cardiology note with him, he does have bilateral vascular disease. I asked the patient to keep me updated,  will share this information with cardiology.

## 2014-09-27 NOTE — Telephone Encounter (Signed)
Caller: [name], Self  Reason for call: Surgery  Patient would like to make his provider aware that he will be having a right foot amputation completed on 09/28/14 at [facility] and will then be in rehabilitation for 3 weeks following surgery.

## 2014-09-28 ENCOUNTER — Encounter (HOSPITAL_COMMUNITY)
Admission: RE | Disposition: A | Discharge status: Skilled Nursing Facility | Payer: Self-pay | Source: Ambulatory Visit | Attending: Orthopedic Surgery

## 2014-09-28 ENCOUNTER — Inpatient Hospital Stay (HOSPITAL_COMMUNITY): Payer: Medicare Other | Admitting: Anesthesiology

## 2014-09-28 ENCOUNTER — Inpatient Hospital Stay (HOSPITAL_COMMUNITY)
Admission: RE | Admit: 2014-09-28 | Discharge: 2014-10-01 | DRG: 476 | Disposition: A | Discharge status: Skilled Nursing Facility | Payer: Medicare Other | Source: Ambulatory Visit | Attending: Orthopedic Surgery | Admitting: Orthopedic Surgery

## 2014-09-28 ENCOUNTER — Encounter (HOSPITAL_COMMUNITY): Payer: Self-pay | Admitting: Anesthesiology

## 2014-09-28 ENCOUNTER — Encounter: Payer: Self-pay | Admitting: Cardiovascular Disease

## 2014-09-28 DIAGNOSIS — I10 Essential (primary) hypertension: Secondary | ICD-10-CM | POA: Diagnosis present

## 2014-09-28 DIAGNOSIS — Z89432 Acquired absence of left foot: Secondary | ICD-10-CM | POA: Diagnosis not present

## 2014-09-28 DIAGNOSIS — F419 Anxiety disorder, unspecified: Secondary | ICD-10-CM | POA: Diagnosis present

## 2014-09-28 DIAGNOSIS — E785 Hyperlipidemia, unspecified: Secondary | ICD-10-CM | POA: Diagnosis present

## 2014-09-28 DIAGNOSIS — F329 Major depressive disorder, single episode, unspecified: Secondary | ICD-10-CM | POA: Diagnosis present

## 2014-09-28 DIAGNOSIS — T8781 Dehiscence of amputation stump: Secondary | ICD-10-CM | POA: Diagnosis present

## 2014-09-28 DIAGNOSIS — Y835 Amputation of limb(s) as the cause of abnormal reaction of the patient, or of later complication, without mention of misadventure at the time of the procedure: Secondary | ICD-10-CM | POA: Diagnosis present

## 2014-09-28 HISTORY — DX: Anemia, unspecified: D64.9

## 2014-09-28 HISTORY — PX: AMPUTATION: SHX166

## 2014-09-28 LAB — COMPREHENSIVE METABOLIC PANEL
ALT: 17 U/L (ref 17–63)
ANION GAP: 9 (ref 5–15)
AST: 26 U/L (ref 15–41)
Albumin: 3.5 g/dL (ref 3.5–5.0)
Alkaline Phosphatase: 37 U/L — ABNORMAL LOW (ref 38–126)
BUN: 22 mg/dL — ABNORMAL HIGH (ref 6–20)
CHLORIDE: 111 mmol/L (ref 101–111)
CO2: 19 mmol/L — AB (ref 22–32)
Calcium: 9.6 mg/dL (ref 8.9–10.3)
Creatinine, Ser: 1.05 mg/dL (ref 0.61–1.24)
Glucose, Bld: 92 mg/dL (ref 65–99)
POTASSIUM: 3.8 mmol/L (ref 3.5–5.1)
Sodium: 139 mmol/L (ref 135–145)
Total Bilirubin: 0.7 mg/dL (ref 0.3–1.2)
Total Protein: 7.4 g/dL (ref 6.5–8.1)

## 2014-09-28 LAB — CBC
HCT: 37.2 % — ABNORMAL LOW (ref 39.0–52.0)
Hemoglobin: 12.2 g/dL — ABNORMAL LOW (ref 13.0–17.0)
MCH: 29.5 pg (ref 26.0–34.0)
MCHC: 32.8 g/dL (ref 30.0–36.0)
MCV: 90.1 fL (ref 78.0–100.0)
PLATELETS: 473 10*3/uL — AB (ref 150–400)
RBC: 4.13 MIL/uL — ABNORMAL LOW (ref 4.22–5.81)
RDW: 16.3 % — ABNORMAL HIGH (ref 11.5–15.5)
WBC: 7.9 10*3/uL (ref 4.0–10.5)

## 2014-09-28 LAB — PROTIME-INR
INR: 1.21 (ref 0.00–1.49)
PROTHROMBIN TIME: 15.5 s — AB (ref 11.6–15.2)

## 2014-09-28 LAB — APTT: aPTT: 29 seconds (ref 24–37)

## 2014-09-28 SURGERY — AMPUTATION BELOW KNEE
Anesthesia: General | Site: Leg Lower | Laterality: Right

## 2014-09-28 MED ORDER — LACTATED RINGERS IV SOLN
INTRAVENOUS | Status: DC
  Administered 2014-09-28: 14:00:00 via INTRAVENOUS

## 2014-09-28 MED ORDER — AMLODIPINE BESYLATE 5 MG PO TABS
5.0000 mg | ORAL_TABLET | Freq: Every day | ORAL | Status: DC
  Administered 2014-09-29 – 2014-10-01 (×3): 5 mg via ORAL
  Filled 2014-09-28 (×4): qty 1

## 2014-09-28 MED ORDER — METHOCARBAMOL 1000 MG/10ML IJ SOLN
500.0000 mg | Freq: Four times a day (QID) | INTRAVENOUS | Status: DC | PRN
  Filled 2014-09-28: qty 5

## 2014-09-28 MED ORDER — HYDROMORPHONE HCL 1 MG/ML IJ SOLN
INTRAMUSCULAR | Status: AC
  Administered 2014-09-28: 0.5 mg via INTRAVENOUS
  Filled 2014-09-28: qty 1

## 2014-09-28 MED ORDER — OXYMETAZOLINE HCL 0.05 % NA SOLN
1.0000 | Freq: Two times a day (BID) | NASAL | Status: DC | PRN
  Filled 2014-09-28: qty 15

## 2014-09-28 MED ORDER — MEPERIDINE HCL 25 MG/ML IJ SOLN: 6.2500 mg | INTRAMUSCULAR | Status: DC | PRN

## 2014-09-28 MED ORDER — PHENYLEPHRINE 40 MCG/ML (10ML) SYRINGE FOR IV PUSH (FOR BLOOD PRESSURE SUPPORT)
PREFILLED_SYRINGE | INTRAVENOUS | Status: AC
  Filled 2014-09-28: qty 20

## 2014-09-28 MED ORDER — MIDAZOLAM HCL 5 MG/5ML IJ SOLN
INTRAMUSCULAR | Status: DC | PRN
  Administered 2014-09-28 (×2): 1 mg via INTRAVENOUS

## 2014-09-28 MED ORDER — CHLORHEXIDINE GLUCONATE 4 % EX LIQD: 60.0000 mL | Freq: Once | CUTANEOUS | Status: DC

## 2014-09-28 MED ORDER — POLYETHYLENE GLYCOL 3350 17 G PO PACK: 17.0000 g | PACK | Freq: Every day | ORAL | Status: DC | PRN

## 2014-09-28 MED ORDER — 0.9 % SODIUM CHLORIDE (POUR BTL) OPTIME
TOPICAL | Status: DC | PRN
  Administered 2014-09-28: 1000 mL

## 2014-09-28 MED ORDER — LIDOCAINE HCL (CARDIAC) 20 MG/ML IV SOLN
INTRAVENOUS | Status: DC | PRN
  Administered 2014-09-28: 100 mg via INTRAVENOUS

## 2014-09-28 MED ORDER — OXYCODONE HCL 5 MG PO TABS
5.0000 mg | ORAL_TABLET | ORAL | Status: DC | PRN
Start: 2014-09-28 — End: 2014-10-01
  Administered 2014-09-28 – 2014-10-01 (×13): 10 mg via ORAL
  Filled 2014-09-28 (×14): qty 2

## 2014-09-28 MED ORDER — ACETAMINOPHEN 650 MG RE SUPP: 650.0000 mg | Freq: Four times a day (QID) | RECTAL | Status: DC | PRN

## 2014-09-28 MED ORDER — CEFAZOLIN SODIUM 1-5 GM-% IV SOLN
1.0000 g | Freq: Four times a day (QID) | INTRAVENOUS | Status: AC
  Administered 2014-09-28 – 2014-09-29 (×3): 1 g via INTRAVENOUS
  Filled 2014-09-28 (×3): qty 50

## 2014-09-28 MED ORDER — FENTANYL CITRATE (PF) 250 MCG/5ML IJ SOLN
INTRAMUSCULAR | Status: AC
  Filled 2014-09-28: qty 5

## 2014-09-28 MED ORDER — OXYCODONE HCL 5 MG PO TABS
ORAL_TABLET | ORAL | Status: AC
  Filled 2014-09-28: qty 2

## 2014-09-28 MED ORDER — METOCLOPRAMIDE HCL 5 MG/ML IJ SOLN: 5.0000 mg | Freq: Three times a day (TID) | INTRAMUSCULAR | Status: DC | PRN

## 2014-09-28 MED ORDER — ONDANSETRON HCL 4 MG/2ML IJ SOLN
INTRAMUSCULAR | Status: AC
  Filled 2014-09-28: qty 2

## 2014-09-28 MED ORDER — FENOFIBRATE 160 MG PO TABS
160.0000 mg | ORAL_TABLET | Freq: Every day | ORAL | Status: DC
  Administered 2014-09-29 – 2014-10-01 (×3): 160 mg via ORAL
  Filled 2014-09-28 (×4): qty 1

## 2014-09-28 MED ORDER — METHOCARBAMOL 500 MG PO TABS
500.0000 mg | ORAL_TABLET | Freq: Four times a day (QID) | ORAL | Status: DC | PRN
  Administered 2014-09-28 – 2014-09-29 (×3): 500 mg via ORAL
  Filled 2014-09-28 (×2): qty 1

## 2014-09-28 MED ORDER — HYDROMORPHONE HCL 1 MG/ML IJ SOLN
INTRAMUSCULAR | Status: AC
  Filled 2014-09-28: qty 1

## 2014-09-28 MED ORDER — CITALOPRAM HYDROBROMIDE 40 MG PO TABS
40.0000 mg | ORAL_TABLET | Freq: Every day | ORAL | Status: DC
  Administered 2014-09-29: 40 mg via ORAL
  Filled 2014-09-28 (×4): qty 1

## 2014-09-28 MED ORDER — SODIUM CHLORIDE 0.9 % IV SOLN
INTRAVENOUS | Status: DC
  Administered 2014-09-28: 21:00:00 via INTRAVENOUS

## 2014-09-28 MED ORDER — LIDOCAINE HCL (CARDIAC) 20 MG/ML IV SOLN
INTRAVENOUS | Status: AC
  Filled 2014-09-28: qty 5

## 2014-09-28 MED ORDER — METOCLOPRAMIDE HCL 5 MG PO TABS: 5.0000 mg | ORAL_TABLET | Freq: Three times a day (TID) | ORAL | Status: DC | PRN

## 2014-09-28 MED ORDER — MIDAZOLAM HCL 2 MG/2ML IJ SOLN
INTRAMUSCULAR | Status: AC
  Filled 2014-09-28: qty 2

## 2014-09-28 MED ORDER — TRAZODONE HCL 50 MG PO TABS
50.0000 mg | ORAL_TABLET | Freq: Every day | ORAL | Status: DC
  Administered 2014-09-28 – 2014-09-30 (×3): 50 mg via ORAL
  Filled 2014-09-28 (×3): qty 1

## 2014-09-28 MED ORDER — ASPIRIN EC 325 MG PO TBEC
325.0000 mg | DELAYED_RELEASE_TABLET | Freq: Every day | ORAL | Status: DC
  Administered 2014-09-29 – 2014-09-30 (×2): 325 mg via ORAL
  Filled 2014-09-28 (×4): qty 1

## 2014-09-28 MED ORDER — ACETAMINOPHEN 325 MG PO TABS: 650.0000 mg | ORAL_TABLET | Freq: Four times a day (QID) | ORAL | Status: DC | PRN

## 2014-09-28 MED ORDER — TRAZODONE HCL 50 MG PO TABS: 50.0000 mg | ORAL_TABLET | Freq: Every day | ORAL | Status: DC | PRN

## 2014-09-28 MED ORDER — LACTATED RINGERS IV SOLN
INTRAVENOUS | Status: DC | PRN
  Administered 2014-09-28: 16:00:00 via INTRAVENOUS

## 2014-09-28 MED ORDER — DOCUSATE SODIUM 100 MG PO CAPS
100.0000 mg | ORAL_CAPSULE | Freq: Two times a day (BID) | ORAL | Status: DC
  Administered 2014-09-28 – 2014-10-01 (×6): 100 mg via ORAL
  Filled 2014-09-28 (×6): qty 1

## 2014-09-28 MED ORDER — HYDROMORPHONE HCL 1 MG/ML IJ SOLN
0.2500 mg | INTRAMUSCULAR | Status: DC | PRN
  Administered 2014-09-28 (×3): 0.5 mg via INTRAVENOUS

## 2014-09-28 MED ORDER — METHOCARBAMOL 500 MG PO TABS
ORAL_TABLET | ORAL | Status: AC
  Filled 2014-09-28: qty 1

## 2014-09-28 MED ORDER — PHENYLEPHRINE HCL 10 MG/ML IJ SOLN
INTRAMUSCULAR | Status: DC | PRN
  Administered 2014-09-28: 80 ug via INTRAVENOUS

## 2014-09-28 MED ORDER — ALPRAZOLAM 0.5 MG PO TABS
0.5000 mg | ORAL_TABLET | Freq: Three times a day (TID) | ORAL | Status: DC
  Administered 2014-09-28 – 2014-10-01 (×7): 0.5 mg via ORAL
  Filled 2014-09-28 (×7): qty 1

## 2014-09-28 MED ORDER — ONDANSETRON HCL 4 MG PO TABS: 4.0000 mg | ORAL_TABLET | Freq: Four times a day (QID) | ORAL | Status: DC | PRN

## 2014-09-28 MED ORDER — ONDANSETRON HCL 4 MG/2ML IJ SOLN: 4.0000 mg | Freq: Once | INTRAMUSCULAR | Status: DC | PRN

## 2014-09-28 MED ORDER — PREGABALIN 75 MG PO CAPS
75.0000 mg | ORAL_CAPSULE | Freq: Two times a day (BID) | ORAL | Status: DC
  Administered 2014-09-28 – 2014-10-01 (×6): 75 mg via ORAL
  Filled 2014-09-28 (×6): qty 1

## 2014-09-28 MED ORDER — FENTANYL CITRATE (PF) 100 MCG/2ML IJ SOLN
INTRAMUSCULAR | Status: DC | PRN
  Administered 2014-09-28 (×5): 50 ug via INTRAVENOUS

## 2014-09-28 MED ORDER — ONDANSETRON HCL 4 MG/2ML IJ SOLN: 4.0000 mg | Freq: Four times a day (QID) | INTRAMUSCULAR | Status: DC | PRN

## 2014-09-28 MED ORDER — SUCRALFATE 1 GM/10ML PO SUSP
1.0000 g | Freq: Four times a day (QID) | ORAL | Status: DC
  Filled 2014-09-28 (×7): qty 10

## 2014-09-28 MED ORDER — BISACODYL 5 MG PO TBEC: 5.0000 mg | DELAYED_RELEASE_TABLET | Freq: Every day | ORAL | Status: DC | PRN

## 2014-09-28 MED ORDER — HYDROMORPHONE HCL 1 MG/ML IJ SOLN
1.0000 mg | INTRAMUSCULAR | Status: DC | PRN
  Administered 2014-09-28 – 2014-10-01 (×9): 1 mg via INTRAVENOUS
  Filled 2014-09-28 (×9): qty 1

## 2014-09-28 MED ORDER — ATORVASTATIN CALCIUM 40 MG PO TABS
40.0000 mg | ORAL_TABLET | Freq: Every day | ORAL | Status: DC
  Administered 2014-09-28 – 2014-09-30 (×3): 40 mg via ORAL
  Filled 2014-09-28 (×3): qty 1

## 2014-09-28 MED ORDER — PROPOFOL 10 MG/ML IV BOLUS
INTRAVENOUS | Status: DC | PRN
  Administered 2014-09-28: 150 mg via INTRAVENOUS

## 2014-09-28 MED ORDER — ONDANSETRON HCL 4 MG/2ML IJ SOLN
INTRAMUSCULAR | Status: DC | PRN
  Administered 2014-09-28: 4 mg via INTRAVENOUS

## 2014-09-28 SURGICAL SUPPLY — 37 items
BLADE SAW RECIP 87.9 MT (BLADE) ×3 IMPLANT
BLADE SURG 21 STRL SS (BLADE) ×3 IMPLANT
BNDG COHESIVE 6X5 TAN STRL LF (GAUZE/BANDAGES/DRESSINGS) ×3 IMPLANT
BNDG GAUZE ELAST 4 BULKY (GAUZE/BANDAGES/DRESSINGS) ×3 IMPLANT
COVER SURGICAL LIGHT HANDLE (MISCELLANEOUS) ×3 IMPLANT
CUFF TOURNIQUET SINGLE 34IN LL (TOURNIQUET CUFF) IMPLANT
CUFF TOURNIQUET SINGLE 44IN (TOURNIQUET CUFF) IMPLANT
DRAPE EXTREMITY T 121X128X90 (DRAPE) ×3 IMPLANT
DRAPE PROXIMA HALF (DRAPES) ×6 IMPLANT
DRAPE U-SHAPE 47X51 STRL (DRAPES) ×3 IMPLANT
DRSG ADAPTIC 3X8 NADH LF (GAUZE/BANDAGES/DRESSINGS) ×3 IMPLANT
DRSG PAD ABDOMINAL 8X10 ST (GAUZE/BANDAGES/DRESSINGS) ×3 IMPLANT
DURAPREP 26ML APPLICATOR (WOUND CARE) ×3 IMPLANT
ELECT REM PT RETURN 9FT ADLT (ELECTROSURGICAL) ×3
ELECTRODE REM PT RTRN 9FT ADLT (ELECTROSURGICAL) ×1 IMPLANT
GAUZE SPONGE 4X4 12PLY STRL (GAUZE/BANDAGES/DRESSINGS) ×3 IMPLANT
GLOVE BIOGEL PI IND STRL 9 (GLOVE) ×1 IMPLANT
GLOVE BIOGEL PI INDICATOR 9 (GLOVE) ×2
GLOVE SURG ORTHO 9.0 STRL STRW (GLOVE) ×3 IMPLANT
GOWN STRL REUS W/ TWL XL LVL3 (GOWN DISPOSABLE) ×2 IMPLANT
GOWN STRL REUS W/TWL XL LVL3 (GOWN DISPOSABLE) ×4
KIT BASIN OR (CUSTOM PROCEDURE TRAY) ×3 IMPLANT
KIT ROOM TURNOVER OR (KITS) ×3 IMPLANT
MANIFOLD NEPTUNE II (INSTRUMENTS) IMPLANT
NS IRRIG 1000ML POUR BTL (IV SOLUTION) ×3 IMPLANT
PACK GENERAL/GYN (CUSTOM PROCEDURE TRAY) ×3 IMPLANT
PAD ARMBOARD 7.5X6 YLW CONV (MISCELLANEOUS) ×6 IMPLANT
SPONGE GAUZE 4X4 12PLY STER LF (GAUZE/BANDAGES/DRESSINGS) ×3 IMPLANT
SPONGE LAP 18X18 X RAY DECT (DISPOSABLE) ×3 IMPLANT
STAPLER VISISTAT 35W (STAPLE) ×3 IMPLANT
STOCKINETTE IMPERVIOUS LG (DRAPES) ×3 IMPLANT
SUT SILK 2 0 (SUTURE) ×2
SUT SILK 2-0 18XBRD TIE 12 (SUTURE) ×1 IMPLANT
SUT VIC AB 1 CTX 27 (SUTURE) ×6 IMPLANT
TOWEL OR 17X24 6PK STRL BLUE (TOWEL DISPOSABLE) ×3 IMPLANT
TOWEL OR 17X26 10 PK STRL BLUE (TOWEL DISPOSABLE) ×3 IMPLANT
WATER STERILE IRR 1000ML POUR (IV SOLUTION) IMPLANT

## 2014-09-28 NOTE — Anesthesia Preprocedure Evaluation (Addendum)
Anesthesia Evaluation  Patient identified by MRN, date of birth, ID band Patient awake    Reviewed: Allergy & Precautions, NPO status , Patient's Chart, lab work & pertinent test results  Airway Mallampati: I  TM Distance: >3 FB Neck ROM: Full    Dental   Pulmonary    Pulmonary exam normal       Cardiovascular hypertension, Pt. on medications + Peripheral Vascular Disease Normal cardiovascular exam    Neuro/Psych PSYCHIATRIC DISORDERS Anxiety  Neuromuscular disease    GI/Hepatic PUD,   Endo/Other    Renal/GU      Musculoskeletal  (+) Arthritis -,   Abdominal   Peds  Hematology  (+) anemia , H/H 12.2/37.2   Anesthesia Other Findings   Reproductive/Obstetrics                           Anesthesia Physical Anesthesia Plan  ASA: III  Anesthesia Plan: General   Post-op Pain Management:    Induction: Intravenous  Airway Management Planned: LMA  Additional Equipment:   Intra-op Plan:   Post-operative Plan: Extubation in OR  Informed Consent: I have reviewed the patients History and Physical, chart, labs and discussed the procedure including the risks, benefits and alternatives for the proposed anesthesia with the patient or authorized representative who has indicated his/her understanding and acceptance.     Plan Discussed with: CRNA and Surgeon  Anesthesia Plan Comments:         Anesthesia Quick Evaluation

## 2014-09-28 NOTE — Anesthesia Procedure Notes (Signed)
Procedure Name: LMA Insertion Date/Time: 09/28/2014 4:44 PM Performed by: Leonel Ramsay Pre-anesthesia Checklist: Patient identified, Emergency Drugs available, Suction available, Patient being monitored and Timeout performed Patient Re-evaluated:Patient Re-evaluated prior to inductionOxygen Delivery Method: Circle system utilized Preoxygenation: Pre-oxygenation with 100% oxygen Intubation Type: IV induction LMA: LMA inserted LMA Size: 4.0 Number of attempts: 1 Placement Confirmation: positive ETCO2 and breath sounds checked- equal and bilateral Tube secured with: Tape Dental Injury: Teeth and Oropharynx as per pre-operative assessment

## 2014-09-28 NOTE — H&P (Signed)
Gene [name] is an 74 y.o. male.   Chief Complaint: Dehiscence right transmetatarsal amputation HPI: Patient is a 74 year old gentleman who presents in follow-up status post right transmetatarsal amputation he has had progressive dehiscence. Patient has a stable left transmetatarsal amputation.  Past Medical History  Diagnosis Date  . Anxiety and depression 1995    symptoms started aprox 1995, after a divorce  . Hyperlipidemia     high TG  . Hypertension   . Neuropathy   . 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 History of alcohol abuse   . 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 Family history of anesthesia complication     "father would start seeing things"  . Anxiety   . Arthritis   . Ulcer of esophagus   . Heart murmur     as a baby " outgrew" " small one per Dr Drue Novel.  . PVD (peripheral vascular disease)     PVD, mild saw Dr Allyson Sabal 2012, intolerant to pletal, Rx observation  . History of blood transfusion   . Anemia     Past Surgical History  Procedure Laterality Date  . Cholecystectomy    . Tonsillectomy    . Amputation Left 11/20/13 and 11/24/13    middle  . Amputation Left 01/03/2014    Procedure: Left Transmetatarsal Amputation;  Surgeon: Nadara Mustard, MD;  Location: [facility] OR;  Service: Orthopedics;  Laterality: Left;  . Transmetatarsal amputation Left 10/2013  . Colonoscopy    . Amputation Right 07/27/2014    Procedure: Right 4th Ray Amputation;  Surgeon: Nadara Mustard, MD;  Location: [facility] OR;  Service: Orthopedics;  Laterality: Right;  . Esophagogastroduodenoscopy N/A 08/16/2014    Procedure: ESOPHAGOGASTRODUODENOSCOPY (EGD);  Surgeon: Vida Rigger, MD;  [facility];  Service: Endoscopy;  Laterality: N/A;  . Esophagogastroduodenoscopy (egd) with propofol N/A 08/17/2014    Procedure: ESOPHAGOGASTRODUODENOSCOPY (EGD) WITH PROPOFOL;  Surgeon: Graylin Shiver, MD;  Location: [facility] ENDOSCOPY;  Service: Endoscopy;  Laterality: N/A;  .  Toe amputation 4th r  07-27-2014  . Transmetatarsal amputation Right 07/06/02016  . Amputation Right 09/12/2014    Procedure: RIGHT TRANSMETATARSAL AMPUTATION;  Surgeon: Nadara Mustard, MD;  Location: [facility] OR;  Service: Orthopedics;  Laterality: Right;    Family History  Problem Relation Age of Onset  . Hyperlipidemia Mother   . Lung cancer Father     asbestos releated  . Colon polyps Father   . Diabetes Neg Hx   . Coronary artery disease Neg Hx   . Prostate cancer Neg Hx    Social History:  reports that he has never smoked. He has never used smokeless tobacco. He reports that he does not drink alcohol or use illicit drugs.  Allergies: No Known Allergies  No prescriptions prior to admission    No results found for this or any previous visit (from the past 48 hour(s)). No results found.  Review of Systems  All other systems reviewed and are negative.   There were no vitals taken for this visit. Physical Exam  On examination patient has dehiscence of the right transmetatarsal amputation Assessment/Plan Assessment: Dehiscence right transmetatarsal amputation.  Plan: We will plan for right below the knee amputation.  Joneisha Miles V 09/28/2014, 6:45 AM

## 2014-09-28 NOTE — Anesthesia Postprocedure Evaluation (Signed)
Anesthesia Post Note  Patient: Gene [name]  Procedure(s) Performed: Procedure(s) (LRB): Right Below Knee Amputation (Right)  Anesthesia type: general  Patient location: PACU  Post pain: Pain level controlled  Post assessment: Patient's Cardiovascular Status Stable  Last Vitals:  Filed Vitals:   09/28/14 1800  BP: 151/78  Pulse: 64  Temp:   Resp: 14    Post vital signs: Reviewed and stable  Level of consciousness: sedated  Complications: No apparent anesthesia complications

## 2014-09-28 NOTE — Op Note (Signed)
   Date of Surgery: 09/28/2014  INDICATIONS: [name] is a 74 y.o.-year-old male who has failed foot salvage surgery and presents at this time for transtibial amputation.  PREOPERATIVE DIAGNOSIS: Dehiscence midfoot amputation  POSTOPERATIVE DIAGNOSIS: Same.  PROCEDURE: Transtibial amputation right  SURGEON: Lajoyce Corners, M.D.  ANESTHESIA:  general  IV FLUIDS AND URINE: See anesthesia.  ESTIMATED BLOOD LOSS: Minimal mL.  COMPLICATIONS: None.  DESCRIPTION OF PROCEDURE: The patient was brought to the operating room and underwent a general anesthetic. After adequate levels of anesthesia were obtained patient's lower extremity was prepped using DuraPrep draped into a sterile field. A timeout was called.  A transverse incision was made 11 cm distal to the tibial tubercle. This curved proximally and a large posterior flap was created. The tibia was transected 1 cm proximal to the skin incision. The fibula was transected just proximal to the tibial incision. The tibia was beveled anteriorly. A large posterior flap was created. The sciatic nerve was pulled cut and allowed to retract. The vascular bundles were suture ligated with 2-0 silk. The deep and superficial fascial layers were closed using #1 Vicryl. The skin was closed using staples and 2-0 nylon. The wound was covered with Adaptic orthopedic sponges AB dressing Kerlix and Coban. Patient was extubated taken to the PACU in stable condition.  Gene Baker, MD [facility] 5:15 PM

## 2014-09-28 NOTE — Transfer of Care (Signed)
Immediate Anesthesia Transfer of Care Note  Patient: Gene [name]  Procedure(s) Performed: Procedure(s): Right Below Knee Amputation (Right)  Patient Location: PACU  Anesthesia Type:General  Level of Consciousness: awake, alert  and oriented  Airway & Oxygen Therapy: Patient Spontanous Breathing and Patient connected to nasal cannula oxygen  Post-op Assessment: Report given to RN and Post -op Vital signs reviewed and stable  Post vital signs: Reviewed and stable  Last Vitals:  Filed Vitals:   09/28/14 1727  BP: 126/71  Pulse: 83  Temp:   Resp: 13    Complications: No apparent anesthesia complications

## 2014-09-29 NOTE — Clinical Social Work Placement (Deleted)
   CLINICAL SOCIAL WORK PLACEMENT  NOTE  Date:  09/29/2014  Patient Details  Name: Gene [name] MRN: [id number] Date of Birth: 1940-11-18  Clinical Social Work is seeking post-discharge placement for this patient at the Skilled  Nursing Facility level of care (*CSW will initial, date and re-position this form in  chart as items are completed):  Yes   Patient/family provided with [facility] Clinical Social Work Department's list of facilities offering this level of care within the geographic area requested by the patient (or if unable, by the patient's family).  Yes   Patient/family informed of their freedom to choose among providers that offer the needed level of care, that participate in Medicare, Medicaid or managed care program needed by the patient, have an available bed and are willing to accept the patient.  Yes   Patient/family informed of [facility] ownership interest in [facility] and [facility], as well as of the fact that they are under no obligation to receive care at these facilities.  PASRR submitted to EDS on 09/29/14     PASRR number received on 09/29/14     Existing PASRR number confirmed on       FL2 transmitted to all facilities in geographic area requested by pt/family on       FL2 transmitted to all facilities within larger geographic area on       Patient informed that his/her managed care company has contracts with or will negotiate with certain facilities, including the following:            Patient/family informed of bed offers received.  Patient chooses bed at       Physician recommends and patient chooses bed at      Patient to be transferred to   on  .  Patient to be transferred to facility by       Patient family notified on   of transfer.  Name of family member notified:        PHYSICIAN       Additional Comment:    _______________________________________________ Linna Caprice, LCSW 09/29/2014, 2:51 PM

## 2014-09-29 NOTE — Clinical Social Work Placement (Signed)
   CLINICAL SOCIAL WORK PLACEMENT  NOTE  Date:  09/29/2014  Patient Details  Name: Gene [name] MRN: [id number] Date of Birth: October 09, 1940  Clinical Social Work is seeking post-discharge placement for this patient at the Skilled  Nursing Facility level of care (*CSW will initial, date and re-position this form in  chart as items are completed):  Yes   Patient/family provided with [facility] Clinical Social Work Department's list of facilities offering this level of care within the geographic area requested by the patient (or if unable, by the patient's family).  Yes   Patient/family informed of their freedom to choose among providers that offer the needed level of care, that participate in Medicare, Medicaid or managed care program needed by the patient, have an available bed and are willing to accept the patient.  Yes   Patient/family informed of [place]'s ownership interest in [facility] and [facility], as well as of the fact that they are under no obligation to receive care at these facilities.  PASRR submitted to EDS on 09/29/14     PASRR number received on 09/29/14     Existing PASRR number confirmed on       FL2 transmitted to all facilities in geographic area requested by pt/family on       FL2 transmitted to all facilities within larger geographic area on       Patient informed that his/her managed care company has contracts with or will negotiate with certain facilities, including the following:            Patient/family informed of bed offers received.  Patient chooses bed at       Physician recommends and patient chooses bed at      Patient to be transferred to   on  .  Patient to be transferred to facility by       Patient family notified on   of transfer.  Name of family member notified:        PHYSICIAN       Additional Comment:    _______________________________________________ Linna Caprice, LCSW 09/29/2014, 2:52 PM

## 2014-09-29 NOTE — Progress Notes (Signed)
   Subjective:  Patient reports pain as mild.    Objective:   VITALS:   Filed Vitals:   09/28/14 1945 09/28/14 2042 09/29/14 0030 09/29/14 0623  BP: 146/61 142/62 119/62 140/74  Pulse: 69 79 80 86  Temp:  98.1 F (36.7 C) 98.1 F (36.7 C) 98.3 F (36.8 C)  TempSrc:  Oral Oral Oral  Resp: Height:      Weight:      SpO2: 98% 100% 97% 97%    Neurologically intact Neurovascular intact Incision: dressing C/D/I and no drainage No cellulitis present Compartment soft   Lab Results  Component Value Date   WBC 7.9 09/28/2014   HGB 12.2* 09/28/2014   HCT 37.2* 09/28/2014   MCV 90.1 09/28/2014   PLT 473* 09/28/2014     Assessment/Plan:  1 Day Post-Op   - Expected postop acute blood loss anemia - will monitor for symptoms - Up with PT/OT - DVT ppx - SCDs, ambulation, asa - NWB operative extremity - Pain control - Discharge planning - patient [facility] to go to [facility] 09/29/2014, 7:52 AM [phone]

## 2014-09-29 NOTE — Clinical Social Work Note (Signed)
Clinical Social Work Assessment  Patient Details  Name: Gene [name] MRN: [id number] Date of Birth: 27-Jan-1941  Date of referral:  09/29/14               Reason for consult:  Facility Placement                Permission sought to share information with:  Chartered certified accountant granted to share information::  Yes, Verbal Permission Granted  Name::        Agency::     Relationship::     Contact Information:     Housing/Transportation Living arrangements for the past 2 months:  Single Family Home Source of Information:  Patient Patient Interpreter Needed:  None Criminal Activity/Legal Involvement Pertinent to Current Situation/Hospitalization:  No - Comment as needed Significant Relationships:  Adult Children, Other Family Members Lives with:  Self Do you feel safe going back to the place where you live?  Yes Need for family participation in patient care:  No (Coment)  Care giving concerns: Pt will need ST SNF as he has a limited support system and will need to increase his level of independence before returning home.  Social Worker assessment / plan: CSW met with pt and explained role of CSW/ d/c planning.  Explained to pt that CSW was contacted by the SW at [facility] that pt wants to go there for rehab at d/c.  Pt confirmed that he would like to go to [place] at d/c, as it is close to his home and close to family.  CSW explained the placement process and that we would need to send his information to all county wide area, per Medicare guidelines.  Pt indicated that he understood and is agreeable to this plan.  CSW will begin bed search and facilitate transfer, as appropriate.  Employment status:  Retired Nurse, adult PT Recommendations:  [place] / Referral to community resources:  [place]  Patient/Family's Response to care: Pt agreeable to placement for ST SNF and is thankful that CSW is  helping him with this process.  Patient/Family's Understanding of and Emotional Response to Diagnosis, Current Treatment, and Prognosis: Pt realizes that he will need continued rehab at the SNF level in order to continue being independent.  He is hopeful that he can continue picking his granddaughter up from school in the fall. Pt in good spirits and was happy that he did so well with therapy today.  Emotional Assessment Appearance:  Appears stated age Attitude/Demeanor/Rapport:    Affect (typically observed):  Calm, Hopeful, Accepting Orientation:  Oriented to Self, Oriented to Place, Oriented to  Time, Oriented to Situation Alcohol / Substance use:  Not Applicable Psych involvement (Current and /or in the community):  No (Comment)  Discharge Needs  Concerns to be addressed:  Discharge Planning Concerns Readmission within the last 30 days:  No Current discharge risk:  None Barriers to Discharge:  No Barriers Identified   Shanta Hartner M, LCSW 09/29/2014, 3:03 PM

## 2014-09-29 NOTE — Progress Notes (Signed)
Physical Therapy Evaluation Patient Details Name: Gene [name] MRN: [id number] DOB: [birth year] Today's Date: 09/29/2014   History of Present Illness  Patient is a 74 year old gentleman with peripheral vascular disease status post Right transtibial amputation on 09/28/14, prior L metatarsals amputation as well.  Clinical Impression  Patient reports readiness for therapy evaluation, looking forward to Rehab care, wants to be able to get back home again eventually.  Patient Mod I for bed mobiltiy, MIN/MOD assist for transfers and short distance gait with RW via 'hop'.  Cues for technique and safety.  Handout of exercises provided, patient instructed not to transfer without assistance.  Crutches and RW trialed today, recommend RW at this time, may re-address crutches after initial balance and mobility levels are improved.  Balance is greatest deficit, followed by strength.  Patient is good candidate for rehab services.    Follow Up Recommendations SNF    Equipment Recommendations  None recommended by PT    Recommendations for Other Services OT consult     Precautions / Restrictions Precautions Precautions: Fall Restrictions Weight Bearing Restrictions: Yes RLE Weight Bearing: Non weight bearing      Mobility  Bed Mobility Overal bed mobility: Modified Independent             General bed mobility comments: increased time  Transfers Overall transfer level: Needs assistance Equipment used: Rolling walker (2 wheeled);Crutches Transfers: Sit to/from [employer] Sit to Stand: Min assist Stand pivot transfers: Mod assist       General transfer comment: vc's for hand placement, cues for posture and to lock elbow during 'hop' for improved ease and safety.  Ambulation/Gait Ambulation/Gait assistance: Mod assist Ambulation Distance (Feet): 15 Feet Assistive device: Rolling walker (2 wheeled);Crutches (Crutches trialed, recommend RW for now.) Gait  Pattern/deviations: Trunk flexed (hop instruction, low step height.) Gait velocity: decreased   General Gait Details: RW better device, cues for posture and locking elbows during 'hop' for improved quality and safety.  Crutches trialed, but recommend RW at this time.  Stairs         General stair comments: pt has 3 levels and stated he plans to scoot up and down on his buttocks as he did before when opposite foot was partially amputated.   Wheelchair Mobility    Modified Rankin (Stroke Patients Only)       Balance Overall balance assessment: Needs assistance   Sitting balance-Leahy Scale: Normal       Standing balance-Leahy Scale: Poor                               Pertinent Vitals/Pain Pain Assessment: 0-10 Pain Score: 6  Pain Location: R LE Pain Descriptors / Indicators: Aching Pain Intervention(s): Monitored during session;Repositioned    Home Living Family/patient expects to be discharged to:: Skilled nursing facility Living Arrangements: Alone Available Help at Discharge: Neighbor;Available PRN/intermittently Type of Home: House Home Access: Level entry     Home Layout: Multi-level Home Equipment: Walker - 2 wheels;Crutches Additional Comments: home is trilevel with RW on one floor, and crutches on 2 others. Pt has been sitting down for stairs and has been doing this effectively without using RLE while trying to get it to heal    Prior Function Level of Independence: Independent with assistive device(s)         Comments: pt can get a ride to grocery store and then rides electric cart     Hand Dominance  Dominant Hand: Right    Extremity/Trunk Assessment               Lower Extremity Assessment: LLE deficits/detail;RLE deficits/detail RLE Deficits / Details: pt able to lift leg against gravity, amputation below knee LLE Deficits / Details: transmet amp, has prosthetic shoe by report for that foot  Cervical / Trunk Assessment:  Normal  Communication   Communication: No difficulties  Cognition Arousal/Alertness: Awake/alert Behavior During Therapy: WFL for tasks assessed/performed Overall Cognitive Status: Within Functional Limits for tasks assessed                      General Comments      Exercises        Assessment/Plan    PT Assessment Patient needs continued PT services  PT Diagnosis Difficulty walking;Abnormality of gait;Acute pain   PT Problem List Decreased balance;Decreased mobility;Pain;Decreased knowledge of precautions;Decreased safety awareness;Decreased strength  PT Treatment Interventions DME instruction;Gait training;Stair training;Functional mobility training;Therapeutic activities;Therapeutic exercise;Balance training;Patient/family education   PT Goals (Current goals can be found in the Care Plan section) Acute Rehab PT Goals Patient Stated Goal: To be able to drive his car again PT Goal Formulation: With patient Time For Goal Achievement: 10/13/14 Potential to Achieve Goals: Good    Frequency Min 5X/week   Barriers to discharge Decreased caregiver support      Co-evaluation               End of Session Equipment Utilized During Treatment: Gait belt Activity Tolerance: Patient tolerated treatment well Patient left: in bed;with call bell/phone within reach (R LE elevated) Nurse Communication: Mobility status         Time: 1200-1305 PT Time Calculation (min) (ACUTE ONLY): 65 min   Charges:   PT Evaluation $Initial PT Evaluation Tier I: 1 Procedure PT Treatments $Gait Training: 8-22 mins $Therapeutic Exercise: 8-22 mins $Therapeutic Activity: 8-22 mins   PT G CodesFreida Busman, Ayani Ospina L October 12, 2014, 2:44 PM

## 2014-09-29 NOTE — Clinical Social Work Note (Deleted)
Clinical Social Work Assessment  Patient Details  Name: Gene [name] MRN: [id number] Date of Birth: 12-11-1940  Date of referral:  09/29/14               Reason for consult:  Facility Placement                Permission sought to share information with:  Chartered certified accountant granted to share information::  Yes, Verbal Permission Granted  Name::        Agency::     Relationship::     Contact Information:     Housing/Transportation Living arrangements for the past 2 months:  Single Family Home Source of Information:  Patient Patient Interpreter Needed:  None Criminal Activity/Legal Involvement Pertinent to Current Situation/Hospitalization:  No - Comment as needed Significant Relationships:  Adult Children, Other Family Members Lives with:  Self Do you feel safe going back to the place where you live?  Yes Need for family participation in patient care:  No (Coment)  Care giving concerns: Pt will need ST SNF as he has a limited support system and will need to increase his level of independence before returning home.  Social Worker assessment / plan: CSW met with pt and explained role of CSW/ d/c planning.  Explained to pt that CSW was contacted by the SW at [facility] that pt wants to go there for rehab at d/c.  Pt confirmed that he would like to go to [place] at d/c, as it is close to his home and close to family.  CSW explained the placement process and that we would need to send his information to all county wide area, per Medicare guidelines.  Pt indicated that he understood and is agreeable to this plan.  CSW will begin bed search and facilitate transfer, as appropriate.  Employment status:  Retired Nurse, adult PT Recommendations:  [place] / Referral to community resources:  [place]  Patient/Family's Response to care: Pt agreeable to placement for ST SNF and is thankful that CSW is  helping him with this process.  Patient/Family's Understanding of and Emotional Response to Diagnosis, Current Treatment, and Prognosis: Pt realizes that he will need continued rehab at the SNF level in order to continue being independent.  He is hopeful that he can continue picking his granddaughter up from school in the fall. Pt in good spirits and was happy that he did so well with therapy today.  Emotional Assessment Appearance:  Appears stated age Attitude/Demeanor/Rapport:    Affect (typically observed):  Calm, Hopeful, Accepting Orientation:  Oriented to Self, Oriented to Place, Oriented to  Time, Oriented to Situation Alcohol / Substance use:  Not Applicable Psych involvement (Current and /or in the community):  No (Comment)  Discharge Needs  Concerns to be addressed:  Discharge Planning Concerns Readmission within the last 30 days:  No Current discharge risk:  None Barriers to Discharge:  No Barriers Identified   Aleli Navedo, Miachel Roux, LCSW 09/29/2014, 2:39 PM

## 2014-09-29 NOTE — Clinical Social Work Placement (Deleted)
   CLINICAL SOCIAL WORK PLACEMENT  NOTE  Date:  09/29/2014  Patient Details  Name: Gene [name] MRN: [id number] Date of Birth: Feb 05, 1941  Clinical Social Work is seeking post-discharge placement for this patient at the Skilled  Nursing Facility level of care (*CSW will initial, date and re-position this form in  chart as items are completed):  Yes   Patient/family provided with [facility] Clinical Social Work Department's list of facilities offering this level of care within the geographic area requested by the patient (or if unable, by the patient's family).  Yes   Patient/family informed of their freedom to choose among providers that offer the needed level of care, that participate in Medicare, Medicaid or managed care program needed by the patient, have an available bed and are willing to accept the patient.  Yes   Patient/family informed of [facility]'s ownership interest in [facility] and [facility], as well as of the fact that they are under no obligation to receive care at these facilities.  PASRR submitted to EDS on 09/29/14     PASRR number received on 09/29/14     Existing PASRR number confirmed on       FL2 transmitted to all facilities in geographic area requested by pt/family on       FL2 transmitted to all facilities within larger geographic area on       Patient informed that his/her managed care company has contracts with or will negotiate with certain facilities, including the following:            Patient/family informed of bed offers received.  Patient chooses bed at       Physician recommends and patient chooses bed at      Patient to be transferred to   on  .  Patient to be transferred to facility by       Patient family notified on   of transfer.  Name of family member notified:        PHYSICIAN       Additional Comment:    _______________________________________________ Linna Caprice, LCSW 09/29/2014, 2:50 PM

## 2014-09-30 NOTE — Progress Notes (Signed)
Patient is POD 2.  He is stable.  SNF pending.  Mayra Reel, MD [facility] [phone] 8:10 AM

## 2014-09-30 NOTE — Progress Notes (Signed)
Physical Therapy Treatment Patient Details Name: Gene [name] MRN: [id number] DOB: 08-25-1940 Today's Date: 09/30/2014    History of Present Illness Patient is a 74 year old gentleman with peripheral vascular disease status post Right transtibial amputation on 09/28/14, prior L metatarsals amputation as well.    PT Comments    Pt doing well despite pain in residual limb.  Educated on positioning and post amputation residual limb care.  Ambulation with RW with MOD A and cueing to keep RW closer for increased safety.  Although pt ambulated with +1, a second person with chair follow may be beneficial to increase distance.  Pt wore L shoe today with gait (didn't at eval) and felt like he did better.  Con't to recommend SNF.  Follow Up Recommendations  SNF     Equipment Recommendations  None recommended by PT    Recommendations for Other Services       Precautions / Restrictions Precautions Precautions: Fall Restrictions RLE Weight Bearing: Non weight bearing    Mobility  Bed Mobility Overal bed mobility: Modified Independent             General bed mobility comments: increased time.  Donned L shoe due to old transmet amputation on L  Transfers Overall transfer level: Needs assistance Equipment used: Rolling walker (2 wheeled) Transfers: Sit to/from Stand Sit to Stand: Min assist         General transfer comment: cues for safe technique  Ambulation/Gait Ambulation/Gait assistance: Mod assist Ambulation Distance (Feet): 20 Feet Assistive device: Rolling walker (2 wheeled) Gait Pattern/deviations: Trunk flexed;Step-to pattern (hopping) Gait velocity: decreased   General Gait Details: Pt keeping RW too far forward with gait and needing A to bring RW back.  Increased L knee flexion at times, but no buckeling.    Stairs            Wheelchair Mobility    Modified Rankin (Stroke Patients Only)       Balance     Sitting balance-Leahy Scale: Good      Standing balance support: Bilateral upper extremity supported Standing balance-Leahy Scale: Poor                      Cognition Arousal/Alertness: Awake/alert Behavior During Therapy: WFL for tasks assessed/performed Overall Cognitive Status: Within Functional Limits for tasks assessed                      Exercises Amputee Exercises Quad Sets: Strengthening;Both;10 reps    General Comments General comments (skin integrity, edema, etc.): Provided education on post-op amputation including phantom pain, importance of touching residual limb, shaping with shrinker once incision has healed etc.      Pertinent Vitals/Pain Pain Assessment: Faces Faces Pain Scale: Hurts even more Pain Location: R residual limb Pain Descriptors / Indicators: Throbbing;Operative site guarding;Grimacing Pain Intervention(s): Patient requesting pain meds-RN notified;Limited activity within patient's tolerance;Monitored during session;Repositioned;Other (comment) (Educated on phantom pain)    Home Living                      Prior Function            PT Goals (current goals can now be found in the care plan section) Acute Rehab PT Goals Patient Stated Goal: To be able to drive his car again PT Goal Formulation: With patient Time For Goal Achievement: 10/13/14 Potential to Achieve Goals: Good Progress towards PT goals: Progressing toward goals    Frequency  Min  5X/week    PT Plan Current plan remains appropriate    Co-evaluation             End of Session Equipment Utilized During Treatment: Gait belt Activity Tolerance: Patient tolerated treatment well Patient left: in chair;with call bell/phone within reach     Time: 1349-1413 PT Time Calculation (min) (ACUTE ONLY): 24 min  Charges:  $Gait Training: 8-22 mins $Therapeutic Activity: 8-22 mins                    G Codes:      Laquasia Pincus LUBECK 09/30/2014, 2:50 PM

## 2014-09-30 NOTE — Progress Notes (Addendum)
CSW continuing to follow.   CSW followed up with pt at bedside to clarify questions surrounding discharge plan. Pt states that [facility] at [facility] has bed available for him tomorrow. CSW clarified pt questions surrounding transportation to SNF by PTAR. CSW provided the additional [facility] bed offers at bedside, but pt states plan is for [employer] at [place].  CSW contacted [facility] admissions coordinator, Grayce Sessions and left voice message in order to confirm plan.   CSW to continue to follow to provide support and assist with pt discharge planning needs. Per RN, anticipate d/c tomorrow.  Addendum :  CSW received return phone call from [place] at [facility] coordinator, Grayce Sessions who confirmed that facility can offer a bed and bed available for pt tomorrow.  CSW updated pt at bedside.  Weekday CSW to facilitate pt discharge needs.  Loletta Specter, MSW, LCSW Clinical Social Work [employer] [phone]

## 2014-09-30 NOTE — Progress Notes (Signed)
Dr. Roda Shutters contacted to clarify discharge date of patient  1:14 PM Dr. Roda Shutters clarified patient will discharge tomorrow 7/25

## 2014-10-01 ENCOUNTER — Encounter (HOSPITAL_COMMUNITY): Payer: Self-pay | Admitting: Orthopedic Surgery

## 2014-10-01 ENCOUNTER — Telehealth: Payer: Self-pay | Admitting: *Deleted

## 2014-10-01 MED ORDER — OXYCODONE-ACETAMINOPHEN 5-325 MG PO TABS: 1.0000 | ORAL_TABLET | ORAL | Status: DC | PRN

## 2014-10-01 NOTE — Clinical Social Work Placement (Signed)
   CLINICAL SOCIAL WORK PLACEMENT  NOTE  Date:  10/01/2014  Patient Details  Name: Gene [name] MRN: [id number] Date of Birth: 01-Nov-1940  Clinical Social Work is seeking post-discharge placement for this patient at the Skilled  Nursing Facility level of care (*CSW will initial, date and re-position this form in  chart as items are completed):  Yes   Patient/family provided with [place] Clinical Social Work Department's list of facilities offering this level of care within the geographic area requested by the patient (or if unable, by the patient's family).  Yes   Patient/family informed of their freedom to choose among providers that offer the needed level of care, that participate in Medicare, Medicaid or managed care program needed by the patient, have an available bed and are willing to accept the patient.  Yes   Patient/family informed of [facility]'s ownership interest in [facility] and [facility], as well as of the fact that they are under no obligation to receive care at these facilities.  PASRR submitted to EDS on 09/29/14     PASRR number received on 09/29/14     Existing PASRR number confirmed on       FL2 transmitted to all facilities in geographic area requested by pt/family on 09/29/14     FL2 transmitted to all facilities within larger geographic area on       Patient informed that his/her managed care company has contracts with or will negotiate with certain facilities, including the following:        Yes   Patient/family informed of bed offers received.  Patient chooses bed at [facility]     Physician recommends and patient chooses bed at  (none)    Patient to be transferred to [facility] on 10/01/14.  Patient to be transferred to facility by PTAR     Patient family notified on 10/01/14 of transfer.  Name of family member notified:  patient alert and oriented x4. No family present at bedside.  Patient declined CSW  contacting family at this time.     PHYSICIAN Please prepare priority discharge summary, including medications     Additional Comment:    _______________________________________________ Rondel Baton, LCSW 10/01/2014, 11:20 AM

## 2014-10-01 NOTE — Discharge Planning (Addendum)
Patient will discharge today per MD order. Patient will discharge to: [facility] RN to call report prior to transportation to: [phone] Transportation: [facility]- called 11:20am  CSW sent discharge summary to SNF for review.  Packet is complete.  RN, patient and family aware of discharge plans.  Vickii Penna, LCSWA [phone]  Psychiatric & Orthopedics (5N 1-16) Clinical Social Worker

## 2014-10-01 NOTE — Progress Notes (Signed)
Pain Rx given with routine Rx's. States no longer takes Celexa - held. Tech is removing IV access site and assisting patient to dress prepare for transport per [facility].

## 2014-10-01 NOTE — Care Management Important Message (Signed)
Important Message  Patient Details  Name: Gene [name] MRN: [id number] Date of Birth: 11/17/1940   Medicare Important Message Given:  [facility] notification given    [name] 10/01/2014, 2:31 PMImportant Message  Patient Details  Name: Gene [name] MRN: [id number] Date of Birth: Jul 29, 1940   Medicare Important Message Given:  Yes-second notification given    [name] 10/01/2014, 2:31 PM

## 2014-10-01 NOTE — Discharge Summary (Signed)
Physician Discharge Summary  Patient ID: Gene [name] MRN: [id number] DOB/AGE: 73-18-1942 74 y.o.  Admit date: 09/28/2014 Discharge date: 10/01/2014  Admission Diagnoses: Dehiscence right foot salvage  Discharge Diagnoses:  Active Problems:   Below knee amputation status   Discharged Condition: stable  Hospital Course: Patient's hospital course was essentially unremarkable. Underwent a right transtibial amputation postoperatively patient progressed slowly and was discharged to skilled nursing.  Consults: None  Significant Diagnostic Studies: labs: Routine labs  Treatments: surgery: See operative note  Discharge Exam: Blood pressure 144/64, pulse 89, temperature 98 F (36.7 C), temperature source Oral, resp. rate 16, height  (1.753 m), weight 89.812 kg (198 lb), SpO2 98 %. Incision/Wound: dressing clean and dry  Disposition: 01-Home or Self Care  Discharge Instructions    Call MD / Call 911    Complete by:  As directed   If you experience chest pain or shortness of breath, CALL 911 and be transported to the hospital emergency room.  If you develope a fever above 101 F, pus (white drainage) or increased drainage or redness at the wound, or calf pain, call your surgeon's office.     Constipation Prevention    Complete by:  As directed   Drink plenty of fluids.  Prune juice may be helpful.  You may use a stool softener, such as Colace (over the counter) 100 mg twice a day.  Use MiraLax (over the counter) for constipation as needed.     Diet - low sodium heart healthy    Complete by:  As directed      Increase activity slowly as tolerated    Complete by:  As directed      Non weight bearing    Complete by:  As directed   Laterality:  right  Extremity:  Lower            Medication List    TAKE these medications        acetaminophen 325 MG tablet  Commonly known as:  TYLENOL  Take 650 mg by mouth every 6 (six) hours as needed (pain).     Alpha-Lipoic Acid 600  MG Caps  Take 1 capsule by mouth daily.     ALPRAZolam 0.5 MG tablet  Commonly known as:  XANAX  Take 1 tablet (0.5 mg total) by mouth 3 (three) times daily.     amLODipine 5 MG tablet  Commonly known as:  NORVASC  Take 1 tablet (5 mg total) by mouth daily.     atorvastatin 40 MG tablet  Commonly known as:  LIPITOR  Take 1 tablet (40 mg total) by mouth at bedtime.     b complex vitamins capsule  Take 1 capsule by mouth daily.     fenofibrate 160 MG tablet  Take 1 tablet (160 mg total) by mouth daily.     Krill Oil 1000 MG Caps  Take 1,000 mg by mouth daily.     LYRICA 75 MG capsule  Generic drug:  pregabalin  Take 75 mg by mouth 2 (two) times daily.     METAMUCIL PO  Take 2 capsules by mouth daily.     oxyCODONE-acetaminophen 5-325 MG per tablet  Commonly known as:  ROXICET  Take 1 tablet by mouth every 4 (four) hours as needed for severe pain.     oxyCODONE-acetaminophen 5-325 MG per tablet  Commonly known as:  ROXICET  Take 1 tablet by mouth every 4 (four) hours as needed for severe pain.  oxymetazoline 0.05 % nasal spray  Commonly known as:  AFRIN  Place 1 spray into both nostrils 2 (two) times daily as needed for congestion.     PROBIOTIC DAILY Caps  Take 1 capsule by mouth daily.     sucralfate 1 GM/10ML suspension  Commonly known as:  CARAFATE  Take 10 mLs (1 g total) by mouth every 6 (six) hours.     traZODone 50 MG tablet  Commonly known as:  DESYREL  Take 1 tablet (50 mg total) by mouth at bedtime.     zolpidem 10 MG tablet  Commonly known as:  AMBIEN  Take 10 mg by mouth at bedtime as needed for sleep.           Follow-up Information    Follow up with DUDA,MARCUS V, MD In 3 weeks.   Specialty:  Orthopedic Surgery   Contact information:   [address] [place] [address] [phone]       Signed: Nadara Mustard 10/01/2014, 6:52 AM

## 2014-10-02 NOTE — Addendum Note (Signed)
Addendum  created 10/02/14 1406 by Arta Bruce, MD   Modules edited: Anesthesia Attestations

## 2014-10-03 ENCOUNTER — Telehealth: Payer: Self-pay | Admitting: Internal Medicine

## 2014-10-03 NOTE — Telephone Encounter (Signed)
FYI for when you return

## 2014-10-03 NOTE — Telephone Encounter (Signed)
Relation to pt: self  Call back number: best # [phone]   Reason for call:  Patient wanted to update PCP regarding post foot surgery, patient is currently at [facility] and had to cancel ECHOCARDIOGRAM for 10/19/14. Patient can be reached on cell phone if MD would like to speak with him .

## 2014-10-04 LAB — BASIC METABOLIC PANEL
BUN: 18 mg/dL (ref 4–21)
Creatinine: 0.8 mg/dL (ref 0.6–1.3)
GLUCOSE: 85 mg/dL
Potassium: 3.9 mmol/L (ref 3.4–5.3)
SODIUM: 140 mmol/L (ref 137–147)

## 2014-10-04 LAB — HEPATIC FUNCTION PANEL
ALT: 11 U/L (ref 10–40)
AST: 20 U/L (ref 14–40)
Alkaline Phosphatase: 32 U/L (ref 25–125)
BILIRUBIN, TOTAL: 0.4 mg/dL

## 2014-10-04 LAB — CBC AND DIFFERENTIAL
HEMATOCRIT: 31 % — AB (ref 41–53)
HEMOGLOBIN: 10 g/dL — AB (ref 13.5–17.5)
Platelets: 443 10*3/uL — AB (ref 150–399)
WBC: 6.2 10^3/mL

## 2014-10-04 LAB — TSH: TSH: 1.12 u[IU]/mL (ref 0.41–5.90)

## 2014-10-08 NOTE — Telephone Encounter (Signed)
Thank you, will see the patient on follow-up

## 2014-10-13 ENCOUNTER — Encounter: Payer: Self-pay | Admitting: Internal Medicine

## 2014-10-13 ENCOUNTER — Encounter: Payer: Self-pay | Admitting: Cardiovascular Disease

## 2014-10-15 ENCOUNTER — Encounter: Payer: Self-pay | Admitting: Internal Medicine

## 2014-10-15 ENCOUNTER — Encounter: Payer: Self-pay | Admitting: Cardiovascular Disease

## 2014-10-15 NOTE — Telephone Encounter (Signed)
Patient has been communicating with me through email.

## 2014-10-18 LAB — CBC AND DIFFERENTIAL
HEMATOCRIT: 38 % — AB (ref 41–53)
Hemoglobin: 12.1 g/dL — AB (ref 13.5–17.5)

## 2014-10-19 ENCOUNTER — Other Ambulatory Visit (HOSPITAL_COMMUNITY): Payer: Medicare Other

## 2014-11-05 ENCOUNTER — Other Ambulatory Visit: Payer: Self-pay | Admitting: Internal Medicine

## 2014-11-05 NOTE — Telephone Encounter (Signed)
Pt is requesting refill on Alprazolam.  Last OV: 08/28/2014 Last Fill" 08/16/2014 #90 0RF UDS: None  Please advise.

## 2014-11-05 NOTE — Telephone Encounter (Signed)
Rx printed, awaiting MD signature.  

## 2014-11-05 NOTE — Telephone Encounter (Signed)
Okay #90, no refills Also, please call the patient, he is due for a cardiology and a visit here, needs to schedule that.

## 2014-11-05 NOTE — Telephone Encounter (Signed)
Rx faxed to [facility] pharmacy.  

## 2014-11-05 NOTE — Telephone Encounter (Signed)
Please call Pt and inform him that his Alprazolam Rx has been sent to [facility], and he is due for a routine F/U appt (30 mins please). Please schedule at Pt's convenience. Thank you.

## 2014-11-16 ENCOUNTER — Encounter: Payer: Self-pay | Admitting: Internal Medicine

## 2014-11-19 ENCOUNTER — Encounter: Payer: Self-pay | Admitting: *Deleted

## 2014-11-21 ENCOUNTER — Telehealth: Payer: Self-pay | Admitting: Internal Medicine

## 2014-11-21 NOTE — Telephone Encounter (Signed)
Pt is requesting refill on Lyrica.  Last OV: 08/28/2014 Last Fill: 04/25/2014 #180 3RF Too early?  Please advise.

## 2014-11-21 NOTE — Telephone Encounter (Signed)
Request denied, #180 tablets and 3 refills sent to [facility] on 05/11/2014.

## 2014-11-21 NOTE — Telephone Encounter (Signed)
Advise patient to use his refills

## 2014-11-23 MED ORDER — LYRICA 75 MG PO CAPS: 75.0000 mg | ORAL_CAPSULE | Freq: Two times a day (BID) | ORAL | Status: DC

## 2014-11-23 NOTE — Telephone Encounter (Signed)
Okay to refill for 3 months 

## 2014-11-23 NOTE — Telephone Encounter (Signed)
Rx printed, awaiting MD signature.  

## 2014-11-23 NOTE — Addendum Note (Signed)
Addended by: Dorette Grate on: 11/23/2014 12:52 PM   Modules accepted: Orders

## 2014-11-23 NOTE — Telephone Encounter (Signed)
Please advise 

## 2014-11-23 NOTE — Telephone Encounter (Addendum)
Caller name: Deren Degrazia Relationship to patient: Self  Can be reached: [phone] Pharmacy:  Reason for call: pt called in stating that his Rx request isn't to soon because he states that he is almost out.  Pt states that he only have 4 tablets left and the script expired on 10/23/14.    Pt request call back to discuss directly

## 2014-11-23 NOTE — Telephone Encounter (Signed)
Rx faxed to [facility] pharmacy.  

## 2014-12-06 ENCOUNTER — Other Ambulatory Visit: Payer: Self-pay | Admitting: Internal Medicine

## 2014-12-06 NOTE — Telephone Encounter (Signed)
Okay #90, no refills 

## 2014-12-06 NOTE — Telephone Encounter (Signed)
Pt is requesting refill on Alprazolam.  Last OV: 08/28/2014 Last Fill: 11/05/2014 #90 0RF UDS: 10/13/2012 Low risk  Please advise.

## 2014-12-06 NOTE — Telephone Encounter (Signed)
Rx printed, awaiting MD signature.  

## 2014-12-06 NOTE — Telephone Encounter (Signed)
Rx faxed to [facility] pharmacy.  

## 2014-12-14 ENCOUNTER — Encounter: Payer: Self-pay | Admitting: Cardiovascular Disease

## 2014-12-14 ENCOUNTER — Ambulatory Visit (INDEPENDENT_AMBULATORY_CARE_PROVIDER_SITE_OTHER): Payer: Medicare Other | Admitting: Cardiovascular Disease

## 2014-12-14 VITALS — BP 122/74 | HR 57 | Ht 69.0 in | Wt 169.0 lb

## 2014-12-14 DIAGNOSIS — E785 Hyperlipidemia, unspecified: Secondary | ICD-10-CM | POA: Diagnosis not present

## 2014-12-14 DIAGNOSIS — I739 Peripheral vascular disease, unspecified: Secondary | ICD-10-CM

## 2014-12-14 DIAGNOSIS — I10 Essential (primary) hypertension: Secondary | ICD-10-CM

## 2014-12-14 NOTE — Assessment & Plan Note (Signed)
History of hyperlipidemia on atorvastatin and fenofibrate with recent lipid profile performed in June of this year revealing a total cholesterol of 164, LDL of 95 and HDL of 43

## 2014-12-14 NOTE — Assessment & Plan Note (Signed)
History of hypertension with blood pressure measured at 122/74. He is on amlodipine and losartan. Continue current meds at current dosing

## 2014-12-14 NOTE — Progress Notes (Addendum)
12/14/2014 Gene [name]   1940/06/04  [id number]  Primary Physician Gene Ora, MD Primary Cardiologist: Gene Gess MD Gene [name]   HPI:   Gene [name] is a 74 year old moderately overweight divorced Caucasian male father of 3 and a grandfather of a grandchild who I last saw in the office 12/29/13. He was initially referred to me by Dr. Leeanne [name] for peripheral vascular evaluation.  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 Is clinically such profiles remarkable for hypertension and hyperlipidemia. He does not smoke but drinks 1-2 beers a day thought to be potentially contributory to his peripheral neuropathy (this is down from 4-5 beers a day in the past after dietary consultation and behavioral modification) . He's never had a heart attack or stroke and denies chest pain or shortness of breath. He had a transmetatarsal amputation on the leftbecause of nonhealing infected ulcer of his left second and third toes which apparently are healing well. Doppler flow showed ABIs are not obtainable because of calcified vessels with multiple short areas of total occlusion  in the anterior and posterior tibial arteries respectively bilaterally. Because of an infected ulcer on his right foot he underwent right transtibial amputation by Dr.Duda  09/28/14.   Current Outpatient Prescriptions  Medication Sig Dispense Refill  . acetaminophen (TYLENOL) 325 MG tablet Take 650 mg by mouth every 6 (six) hours as needed (pain).    . Alpha-Lipoic Acid 600 MG CAPS Take 1 capsule by mouth daily.    Marland Kitchen ALPRAZolam (XANAX) 0.5 MG tablet Take 1 tablet (0.5 mg total) by mouth 3 (three) times daily. 90 tablet 0  . amLODipine (NORVASC) 5 MG tablet Take 1 tablet (5 mg total) by mouth daily. 30 tablet 6  . atorvastatin (LIPITOR) 40 MG tablet Take 1 tablet (40 mg total) by mouth at bedtime. 30 tablet 3  . b complex vitamins capsule Take 1 capsule by mouth daily.     . citalopram (CELEXA) 20 MG tablet Take 40 mg by mouth daily.     . fenofibrate 160 MG tablet Take 1 tablet (160 mg total) by mouth daily. 30 tablet 2  . Krill Oil 1000 MG CAPS Take 1,000 mg by mouth daily.     Marland Kitchen losartan (COZAAR) 100 MG tablet Take 100 mg by mouth daily.  0  . LYRICA 75 MG capsule Take 1 capsule (75 mg total) by mouth 2 (two) times daily. 60 capsule 2  . oxymetazoline (AFRIN) 0.05 % nasal spray Place 1 spray into both nostrils 2 (two) times daily as needed for congestion.    . Probiotic Product (PROBIOTIC DAILY) CAPS Take 1 capsule by mouth daily.    . Psyllium (METAMUCIL PO) Take 2 capsules by mouth daily.    Marland Kitchen RA ASPIRIN EC 81 MG EC tablet Take 81 mg by mouth daily.  0  . sucralfate (CARAFATE) 1 GM/10ML suspension Take 10 mLs (1 g total) by mouth every 6 (six) hours. 420 mL 0  . traZODone (DESYREL) 50 MG tablet Take 1 tablet (50 mg total) by mouth at bedtime. 30 tablet 3   No current facility-administered medications for this visit.    No Known Allergies  Social History   Social History  . Marital Status: Divorced    Spouse Name: N/A  . Number of Children: 3  . Years of Education: College   Occupational History  . 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 Smoking status: Never Smoker   . Smokeless tobacco: Never Used  . Alcohol Use: No     Comment: h/o abuse, now on weekends, slt heavier x last few months   States no alcohol  . Drug Use: No  . Sexual Activity: Not on file   Other Topics Concern  . Not on file   Social History Narrative    Divorced since 1994; lives by himself, 3 sons, 1 GD  (all live close by)   [place] veteran       Review of Systems: General: negative for chills, fever, night sweats or weight changes.  Cardiovascular: negative for chest pain, dyspnea on exertion, edema, orthopnea, palpitations, paroxysmal nocturnal dyspnea or shortness of breath Dermatological: negative for  rash Respiratory: negative for cough or wheezing Urologic: negative for hematuria Abdominal: negative for nausea, vomiting, diarrhea, bright red blood per rectum, melena, or hematemesis Neurologic: negative for visual changes, syncope, or dizziness All other systems reviewed and are otherwise negative except as noted above.    Blood pressure 122/74, pulse 57, height  (1.753 m), weight 169 lb (76.658 kg).  General appearance: alert and no distress Neck: no adenopathy, no carotid bruit, no JVD, supple, symmetrical, trachea midline and thyroid not enlarged, symmetric, no tenderness/mass/nodules Lungs: clear to auscultation bilaterally Heart: regular rate and rhythm, S1, S2 normal, no murmur, click, rub or gallop Extremities: status post right transtibial amputation  EKG not performed today  ASSESSMENT AND PLAN:   PVD (peripheral vascular disease) History of peripheral arterial disease with principally scattered tibial vessel disease. He's had a left transmetatarsal amputation and since I saw him on 12/29/13 he had transtibial amputation on the right by Dr. Lajoyce [name].on 09/28/14 with a slowly healing stump.  his ABIs were normal but his vessels are noncompressible  Hyperlipidemia History of hyperlipidemia on atorvastatin and fenofibrate with recent lipid profile performed in June of this year revealing a total cholesterol of 164, LDL of 95 and HDL of 43  HTN (hypertension) History of hypertension with blood pressure measured at 122/74. He is on amlodipine and losartan. Continue current meds at current dosing      Gene Gess MD [facility], [facility] 12/14/2014 9:30 AM

## 2014-12-14 NOTE — Patient Instructions (Signed)
Medication Instructions:  Your physician recommends that you continue on your current medications as directed. Please refer to the Current Medication list given to you today.  Labwork: none  Testing/Procedures: none  Follow-Up: Dr Allyson Sabal has referred you to Karenann Cai, registered dietitian nutritionist.  I have sent her your information and she should be in contact with you.  Her contact information is [phone].  Your physician wants you to follow-up in: 12 months with Dr. Allyson Sabal. You will receive a reminder letter in the mail two months in advance. If you don't receive a letter, please call our office to schedule the follow-up appointment.   Any Other Special Instructions Will Be Listed Below (If Applicable).

## 2014-12-14 NOTE — Assessment & Plan Note (Addendum)
History of peripheral arterial disease with principally scattered tibial vessel disease. He's had a left transmetatarsal amputation and since I saw him on 12/29/13 he had transtibial amputation on the right by Dr. Lajoyce Corners.on 09/28/14 with a slowly healing stump.  his ABIs were normal but his vessels are noncompressible

## 2014-12-17 ENCOUNTER — Encounter: Payer: Self-pay | Admitting: Cardiovascular Disease

## 2014-12-26 ENCOUNTER — Encounter: Payer: Self-pay | Admitting: Internal Medicine

## 2014-12-31 ENCOUNTER — Encounter: Payer: Self-pay | Admitting: Cardiovascular Disease

## 2015-01-01 ENCOUNTER — Telehealth: Payer: Self-pay | Admitting: Cardiovascular Disease

## 2015-01-01 ENCOUNTER — Other Ambulatory Visit: Payer: Self-pay

## 2015-01-01 ENCOUNTER — Ambulatory Visit (HOSPITAL_COMMUNITY): Payer: Medicare Other | Attending: Internal Medicine

## 2015-01-01 DIAGNOSIS — E785 Hyperlipidemia, unspecified: Secondary | ICD-10-CM | POA: Diagnosis not present

## 2015-01-01 DIAGNOSIS — I340 Nonrheumatic mitral (valve) insufficiency: Secondary | ICD-10-CM | POA: Insufficient documentation

## 2015-01-01 DIAGNOSIS — I517 Cardiomegaly: Secondary | ICD-10-CM | POA: Insufficient documentation

## 2015-01-01 DIAGNOSIS — I071 Rheumatic tricuspid insufficiency: Secondary | ICD-10-CM | POA: Insufficient documentation

## 2015-01-01 DIAGNOSIS — I10 Essential (primary) hypertension: Secondary | ICD-10-CM | POA: Insufficient documentation

## 2015-01-01 DIAGNOSIS — R011 Cardiac murmur, unspecified: Secondary | ICD-10-CM | POA: Diagnosis not present

## 2015-01-01 DIAGNOSIS — I5189 Other ill-defined heart diseases: Secondary | ICD-10-CM | POA: Insufficient documentation

## 2015-01-01 DIAGNOSIS — R7303 Prediabetes: Secondary | ICD-10-CM | POA: Diagnosis not present

## 2015-01-01 DIAGNOSIS — R739 Hyperglycemia, unspecified: Secondary | ICD-10-CM | POA: Diagnosis not present

## 2015-01-01 DIAGNOSIS — I352 Nonrheumatic aortic (valve) stenosis with insufficiency: Secondary | ICD-10-CM | POA: Diagnosis not present

## 2015-01-01 NOTE — Telephone Encounter (Signed)
Pt is calling wanting to speak with Selena BattenKim about a MyChart message he sent. Please f/u with him  Thanks

## 2015-01-02 NOTE — Telephone Encounter (Signed)
Spoke with mr. Scicchitano.  He is aware changes have been made to her last office note and that it would be mailed to him. Pt verbalized understanding no questions at this time.

## 2015-01-02 NOTE — Telephone Encounter (Signed)
Left message for patient to call back  

## 2015-01-03 ENCOUNTER — Other Ambulatory Visit: Payer: Self-pay | Admitting: Internal Medicine

## 2015-01-03 NOTE — Telephone Encounter (Signed)
Ok 90 and 1 RF 

## 2015-01-03 NOTE — Telephone Encounter (Signed)
Pt is requesting refill on Alprazolam.  Last OV: 08/28/2014, appt scheduled 01/07/2015 at 2:30 PM Last Fill: 12/06/2014 #90 and 0 RF, Pt can take 1 tablet tid PRN UDS: 01/20/2013 Low risk  Please advise.

## 2015-01-04 NOTE — Telephone Encounter (Signed)
Rx faxed to [facility] pharmacy.  

## 2015-01-04 NOTE — Telephone Encounter (Signed)
Scheduled for 01/07/15

## 2015-01-04 NOTE — Telephone Encounter (Signed)
Rx printed, awaiting MD signature.  

## 2015-01-07 ENCOUNTER — Ambulatory Visit (INDEPENDENT_AMBULATORY_CARE_PROVIDER_SITE_OTHER): Payer: Medicare Other | Admitting: Internal Medicine

## 2015-01-07 ENCOUNTER — Encounter: Payer: Self-pay | Admitting: Internal Medicine

## 2015-01-07 VITALS — BP 128/70 | HR 62 | Temp 97.6°F | Ht 69.0 in | Wt 185.6 lb

## 2015-01-07 DIAGNOSIS — I10 Essential (primary) hypertension: Secondary | ICD-10-CM | POA: Diagnosis not present

## 2015-01-07 DIAGNOSIS — Z09 Encounter for follow-up examination after completed treatment for conditions other than malignant neoplasm: Secondary | ICD-10-CM

## 2015-01-07 DIAGNOSIS — M869 Osteomyelitis, unspecified: Secondary | ICD-10-CM | POA: Diagnosis not present

## 2015-01-07 DIAGNOSIS — R399 Unspecified symptoms and signs involving the genitourinary system: Secondary | ICD-10-CM | POA: Diagnosis not present

## 2015-01-07 DIAGNOSIS — D509 Iron deficiency anemia, unspecified: Secondary | ICD-10-CM | POA: Diagnosis not present

## 2015-01-07 DIAGNOSIS — L659 Nonscarring hair loss, unspecified: Secondary | ICD-10-CM | POA: Diagnosis not present

## 2015-01-07 MED ORDER — AMLODIPINE BESYLATE 5 MG PO TABS: 5.0000 mg | ORAL_TABLET | Freq: Every day | ORAL | Status: DC

## 2015-01-07 MED ORDER — ATORVASTATIN CALCIUM 40 MG PO TABS: 40.0000 mg | ORAL_TABLET | Freq: Every day | ORAL | Status: DC

## 2015-01-07 MED ORDER — FENOFIBRATE 160 MG PO TABS: 160.0000 mg | ORAL_TABLET | Freq: Every day | ORAL | Status: DC

## 2015-01-07 MED ORDER — LOSARTAN POTASSIUM 100 MG PO TABS: 100.0000 mg | ORAL_TABLET | Freq: Every day | ORAL | Status: DC

## 2015-01-07 NOTE — Progress Notes (Signed)
Pre visit review using our clinic review tool, if applicable. No additional management support is needed unless otherwise documented below in the visit note. 

## 2015-01-07 NOTE — Progress Notes (Signed)
Subjective:    Patient ID: Gene [name], male    DOB: 09-12-1940, 74 y.o.   MRN: [id number]  DOS:  01/07/2015 Type of visit - description : Routine visit, last visit at this office 08/28/2014 Interval history: Patient underwent a right metatarsal amputation follow-up by a right BKA 09-2014, after the hospital he was discharged to a rehabilitation facility x 4 weeks, then he went home. Currently, feeling well , able to his relatively well Med list reviewed, good compliance. Hypertension: Ambulatory BPs in the 120/78 Anxiety and depression: Symptoms well-controlled.    Review of Systems No fever chills Reports urinary frequency for the last month without dysuria or gross hematuria. No abdominal or flank pain. No GERD, dysphagia or odynophagia. No blood in the stools.   Past Medical History  Diagnosis Date  . Anxiety and depression 1995    symptoms started aprox 1995, after a divorce  . Hyperlipidemia     high TG  . Hypertension   . Neuropathy (HCC)   . 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 History of alcohol abuse   . 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 Family history of anesthesia complication     "father would start seeing things"  . Anxiety   . Arthritis   . Ulcer of esophagus   . Heart murmur     as a baby " outgrew" " small one per Dr Drue Novel.  . PVD (peripheral vascular disease) (HCC)     PVD, mild saw Dr Allyson Sabal 2012, intolerant to pletal, Rx observation  . History of blood transfusion   . Anemia     Past Surgical History  Procedure Laterality Date  . Cholecystectomy    . Tonsillectomy    . Amputation Left 11/20/13 and 11/24/13    middle  . Amputation Left 01/03/2014    Procedure: Left Transmetatarsal Amputation;  Surgeon: Nadara Mustard, MD;  Location: [facility] OR;  Service: Orthopedics;  Laterality: Left;  . Transmetatarsal amputation Left 10/2013  . Colonoscopy    . Amputation Right 07/27/2014    Procedure:  Right 4th Ray Amputation;  Surgeon: Nadara Mustard, MD;  Location: [facility] OR;  Service: Orthopedics;  Laterality: Right;  . Esophagogastroduodenoscopy N/A 08/16/2014    Procedure: ESOPHAGOGASTRODUODENOSCOPY (EGD);  Surgeon: Vida Rigger, MD;  Location: [facility] ENDOSCOPY;  Service: Endoscopy;  Laterality: N/A;  . Esophagogastroduodenoscopy (egd) with propofol N/A 08/17/2014    Procedure: ESOPHAGOGASTRODUODENOSCOPY (EGD) WITH PROPOFOL;  Surgeon: Graylin Shiver, MD;  Location: [facility] ENDOSCOPY;  Service: Endoscopy;  Laterality: N/A;  . Toe amputation 4th r  07-27-2014  . Transmetatarsal amputation Right 07/06/02016  . Amputation Right 09/12/2014    Procedure: RIGHT TRANSMETATARSAL AMPUTATION;  Surgeon: Nadara Mustard, MD;  [facility];  Service: Orthopedics;  Laterality: Right;  . Amputation Right 09/28/2014    Procedure: Right Below Knee Amputation;  Surgeon: Nadara Mustard, MD;  Location: [facility] OR;  Service: Orthopedics;  Laterality: Right;    Social History   Social History  . Marital Status: Divorced    Spouse Name: N/A  . Number of Children: 3  . Years of Education: College   Occupational History  . 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 Smoking status: Never Smoker   . Smokeless tobacco: Never Used  . Alcohol Use: No     Comment:  h/o abuse, now on weekends, slt heavier x last few months   States no alcohol  . Drug Use: No  . Sexual Activity: Not on file   Other Topics Concern  . Not on file   Social History Narrative    Divorced since 1994; lives by himself, 3 sons, 1 GD  (all live close by)   [place]          Medication List       This list is accurate as of: 01/07/15 11:59 PM.  Always use your most recent med list.               acetaminophen 325 MG tablet  Commonly known as:  TYLENOL  Take 650 mg by mouth every 6 (six) hours as needed (pain).     Alpha-Lipoic Acid 600 MG Caps  Take 1 capsule by mouth daily.      ALPRAZolam 0.5 MG tablet  Commonly known as:  XANAX  Take 1 tablet (0.5 mg total) by mouth 3 (three) times daily.     amLODipine 5 MG tablet  Commonly known as:  NORVASC  Take 1 tablet (5 mg total) by mouth daily.     atorvastatin 40 MG tablet  Commonly known as:  LIPITOR  Take 1 tablet (40 mg total) by mouth at bedtime.     b complex vitamins capsule  Take 1 capsule by mouth daily.     Biotin 10 MG Caps  Take by mouth.     citalopram 20 MG tablet  Commonly known as:  CELEXA  Take 40 mg by mouth daily.     fenofibrate 160 MG tablet  Take 1 tablet (160 mg total) by mouth daily.     Krill Oil 1000 MG Caps  Take 1,000 mg by mouth daily.     losartan 100 MG tablet  Commonly known as:  COZAAR  Take 1 tablet (100 mg total) by mouth daily.     LYRICA 75 MG capsule  Generic drug:  pregabalin  Take 1 capsule (75 mg total) by mouth 2 (two) times daily.     METAMUCIL PO  Take 2 capsules by mouth daily.     oxymetazoline 0.05 % nasal spray  Commonly known as:  AFRIN  Place 1 spray into both nostrils 2 (two) times daily as needed for congestion.     PROBIOTIC DAILY Caps  Take 1 capsule by mouth daily.     RA ASPIRIN EC 81 MG EC tablet  Generic drug:  aspirin  Take 81 mg by mouth daily.     traZODone 50 MG tablet  Commonly known as:  DESYREL  Take 1 tablet (50 mg total) by mouth at bedtime.     VITAMIN C & E COMBINATION PO  Take 3,000 mg by mouth.           Objective:   Physical Exam BP 128/70 mmHg  Pulse 62  Temp(Src) 97.6 F (36.4 C) (Oral)  Ht 5\' 9"  (1.753 m)  Wt 185 lb 9.6 oz (84.188 kg)  BMI 27.40 kg/m2  SpO2 98% General:   Well developed, well nourished . NAD.  HEENT:  Normocephalic . Face symmetric, atraumatic Lungs:  CTA B Normal respiratory effort, no intercostal retractions, no accessory muscle use. Heart: RRR,  soft systolic murmur.  Abdomen:  Not distended, soft, non-tender. No rebound or rigidity.  DRE: Prostate normal, not enlarged or  tender. Not nodular. brown stools. Skin: Not pale. Not jaundice Neurologic:  alert &  oriented X3.  Speech normal Psych--  Cognition and judgment appear intact.  Cooperative with normal attention span and concentration.  Behavior appropriate. No anxious or depressed appearing.    Assessment & Plan:   Assessement > Hyperglycemia,  (A1c 5.8  2013)  HTN Hyperlipidemia Anxiety, depression onset 1995 after a divorce Aortic stenosis: Mild per echocardiogram 12-2014, has a soft murmur Peripheral vascular disease --- per Dr. Allyson Sabal . Pletal intolerant, ABIs normal but vessels are noncompressible Neuropathy , peripheral: d/t ETOH (per neuro note, Dr Anne Hahn,  2014) NCS 05-2011: Moderate neuropathy, low-grade right L5 radiculopathy Bilateral ulnar neuropathy MSK: --Osteomyelitis --Multiple toes amputations 2014 --Left transmetatarsal amputation 01/03/2014 (osteomyelitis) --Right transmetatarsal amputation 09/12/2014 -- R BKA  09/28/2014 d/  Dehiscence midfoot amputation GI bleed, admited 08-2014 >>> EGD  esophageal ulcer (d/t nsaids and doxycycline ?) H/o Alcohol abuse  Plan: Osteomyelitis: Status post multiple amputations, see surgical history. Currently seems to be doing well. Not on antibiotics HTN: Seems well-controlled, check labs LUTS: Complaining of urinary frequency, DRE negative, check labs. GI bleed due to esophageal ulcer, currently asx, hemoglobin was checked at the nursing home: 09/26/2014 was 10, 10/18/2014 was 12.1. Currently on no medications, does not like to go back to GI. Will check labs Heart murmur: Likely related to aortic stenosis found on a recent echo. Complaining of hair loss: Will check a TSH RTC 3 or 4 months

## 2015-01-07 NOTE — Patient Instructions (Signed)
Get your blood work before you leave     Next visit  for a 3-4 months for a checkup   (30  minutes) Please schedule an appointment at the front desk Please come back fasting

## 2015-01-08 ENCOUNTER — Ambulatory Visit: Payer: Medicare Other | Admitting: Cardiovascular Disease

## 2015-01-08 ENCOUNTER — Encounter: Payer: Self-pay | Admitting: Internal Medicine

## 2015-01-08 DIAGNOSIS — Z09 Encounter for follow-up examination after completed treatment for conditions other than malignant neoplasm: Secondary | ICD-10-CM | POA: Insufficient documentation

## 2015-01-08 LAB — CBC WITH DIFFERENTIAL/PLATELET
Basophils Absolute: 0 10*3/uL (ref 0.0–0.1)
Basophils Relative: 0.1 % (ref 0.0–3.0)
EOS ABS: 0.1 10*3/uL (ref 0.0–0.7)
Eosinophils Relative: 1.4 % (ref 0.0–5.0)
HCT: 41.6 % (ref 39.0–52.0)
HEMOGLOBIN: 13.7 g/dL (ref 13.0–17.0)
LYMPHS PCT: 26.2 % (ref 12.0–46.0)
Lymphs Abs: 1.6 10*3/uL (ref 0.7–4.0)
MCHC: 32.9 g/dL (ref 30.0–36.0)
MCV: 85.9 fl (ref 78.0–100.0)
Monocytes Absolute: 0.6 10*3/uL (ref 0.1–1.0)
Monocytes Relative: 10.5 % (ref 3.0–12.0)
Neutro Abs: 3.7 10*3/uL (ref 1.4–7.7)
Neutrophils Relative %: 61.8 % (ref 43.0–77.0)
Platelets: 194 10*3/uL (ref 150.0–400.0)
RBC: 4.84 Mil/uL (ref 4.22–5.81)
RDW: 18.1 % — ABNORMAL HIGH (ref 11.5–15.5)
WBC: 6 10*3/uL (ref 4.0–10.5)

## 2015-01-08 LAB — URINALYSIS, ROUTINE W REFLEX MICROSCOPIC
Bilirubin Urine: NEGATIVE
Hgb urine dipstick: NEGATIVE
KETONES UR: NEGATIVE
LEUKOCYTES UA: NEGATIVE
Nitrite: NEGATIVE
PH: 6 (ref 5.0–8.0)
RBC / HPF: NONE SEEN (ref 0–?)
SPECIFIC GRAVITY, URINE: 1.025 (ref 1.000–1.030)
TOTAL PROTEIN, URINE-UPE24: NEGATIVE
UROBILINOGEN UA: 0.2 (ref 0.0–1.0)
Urine Glucose: NEGATIVE

## 2015-01-08 LAB — FERRITIN: FERRITIN: 49.9 ng/mL (ref 22.0–322.0)

## 2015-01-08 LAB — BASIC METABOLIC PANEL
BUN: 32 mg/dL — AB (ref 6–23)
CO2: 24 mEq/L (ref 19–32)
Calcium: 9.8 mg/dL (ref 8.4–10.5)
Chloride: 108 mEq/L (ref 96–112)
Creatinine, Ser: 1.06 mg/dL (ref 0.40–1.50)
GFR: 72.72 mL/min (ref >60.00)
Glucose, Bld: 94 mg/dL (ref 70–99)
Potassium: 4.5 mEq/L (ref 3.5–5.1)
Sodium: 142 mEq/L (ref 135–145)

## 2015-01-08 LAB — TSH: TSH: 2.03 u[IU]/mL (ref 0.35–4.50)

## 2015-01-08 LAB — URINE CULTURE
Colony Count: NO GROWTH
Organism ID, Bacteria: NO GROWTH

## 2015-01-08 LAB — IRON: IRON: 103 ug/dL (ref 42–165)

## 2015-01-08 NOTE — Assessment & Plan Note (Signed)
Osteomyelitis: Status post multiple amputations, see surgical history. Currently seems to be doing well. Not on antibiotics HTN: Seems well-controlled, check labs LUTS: Complaining of urinary frequency, DRE negative, check labs. GI bleed due to esophageal ulcer, currently asx, hemoglobin was checked at the nursing home: 09/26/2014 was 10, 10/18/2014 was 12.1. Currently on no medications, does not like to go back to GI. Will check labs Heart murmur: Likely related to aortic stenosis found on a recent echo. Complaining of hair loss: Will check a TSH RTC 3 or 4 months

## 2015-01-25 ENCOUNTER — Telehealth: Payer: Self-pay | Admitting: Internal Medicine

## 2015-01-25 ENCOUNTER — Ambulatory Visit (INDEPENDENT_AMBULATORY_CARE_PROVIDER_SITE_OTHER): Payer: Medicare Other | Admitting: Podiatry

## 2015-01-25 ENCOUNTER — Encounter: Payer: Self-pay | Admitting: Podiatry

## 2015-01-25 VITALS — BP 104/43 | HR 69 | Resp 12

## 2015-01-25 DIAGNOSIS — Z899 Acquired absence of limb, unspecified: Secondary | ICD-10-CM | POA: Diagnosis not present

## 2015-01-25 DIAGNOSIS — G629 Polyneuropathy, unspecified: Secondary | ICD-10-CM | POA: Diagnosis not present

## 2015-01-25 NOTE — Telephone Encounter (Signed)
Relation to WU:JWJXpt:self Call back number: [phone]   Reason for call:  Dr. Loreta AveWagner podiatrist recommended patient increasing the dosage from 1 tablet of LYRICA 75 MG capsule to 2 tablets a day due to patient having neuropathy and patient states he is experiencing edema in need of clinical advice. Please advise

## 2015-01-25 NOTE — Telephone Encounter (Signed)
Called patient who states he was seen by his prosthetic consultant , Kathlene NovemberMike with [employer] and he feels patient has edema in L leg. States if fitted for  Prothesis when edema goes down prosthesis will probably not fit. Patient also seen by podiatrist who suggests patient increase his Lyrica to 3 times daily instead of 2. Feels this would prevent worsening neuropathy in Left foot. Patient wants you opinion on these matters. Please advise

## 2015-01-25 NOTE — Telephone Encounter (Signed)
Message routed to Karen Carter, LPN.   

## 2015-01-28 ENCOUNTER — Encounter: Payer: Self-pay | Admitting: Podiatry

## 2015-01-28 DIAGNOSIS — G629 Polyneuropathy, unspecified: Secondary | ICD-10-CM | POA: Insufficient documentation

## 2015-01-28 NOTE — Telephone Encounter (Signed)
Patient has agreed to com in for appointment 01/09/15.

## 2015-01-28 NOTE — Progress Notes (Signed)
Patient ID: Gene [name], male   DOB: 01/16/1941, 74 y.o.   MRN: [id number]  Subjective: 74 year old male presents the office today for diabetic risk assessment and to check his left foot to ensure that there is no infection. Since I last saw him he has undergone a transtibial amputation on the right side. He presented with a wound in the ball of his foot for which I saw him last on his right foot but he was scheduled to have toe amputation with Dr. Lajoyce Cornersuda the next day. He subsequently underwent transmetatarsal amputation ultimately a transtibial amputation. On the left side which I previously saw him for having multiple toe ulcerations which I performed digital amputations and he ultimately had a transmetatarsal amputation by Dr. Lajoyce Cornersuda. He returns today to check the left foot. He denies any swelling, redness or open sores to the left foot at this time. He denies any systemic complaints such as fevers, chills, nausea, vomiting. No calf pain, chest pain, shortness of breath. At his appointment he did admit adhesive drink 8 beers a night however he is cut back to 2 a night.   Objective: AAO x 3, NAD DP/PT pulses palpable on the left side. There is an immediate capillary refill time to the flap of the transmetatarsal amputation. 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 On the left side there is not appear to be any open lesions or pre-ulcer lesions identified at this time. There is no overlying edema, erythema, increase in warmth to the left foot or ankle. Ankle joint range of motion is intact without any restrictions or pain. There is no area of tenderness to the left lower extremity. There is no pain with calf compression, swelling, warmth, erythema.  Assessment: 74 year old male with left transmetatarsal amputation; right transtibial amputation.  Plan: -Treatment options discussed including all alternatives, risks, and complications -At this time, there does not appear to be any infection or ulceration  on the left side. Continue follow-up with Dr. Lajoyce Cornersuda for the right transtibial amputation. -Continue to monitor daily for any skin breakdown or any changes. If his any problems to call the office. -Follow-up in 2-3 months at his request to check on the left foot or sooner if any problems arise. In the meantime, encouraged to call the office with any questions, concerns, change in symptoms.   Gene [name], DPM

## 2015-01-28 NOTE — Telephone Encounter (Signed)
1. Lyrica  will not "prevent" worsening of neuropathy, if he has pain and  symptoms okay to increase to 3 times a day. 2. As far as the edema, the last time he was here I don't recall him having a lot of edema, if that is the case please come back to the office, may need to stop amlodipine and start a diuretic.

## 2015-02-04 ENCOUNTER — Telehealth: Payer: Self-pay | Admitting: Internal Medicine

## 2015-02-04 MED ORDER — LYRICA 75 MG PO CAPS: 75.0000 mg | ORAL_CAPSULE | Freq: Three times a day (TID) | ORAL | Status: DC

## 2015-02-04 NOTE — Telephone Encounter (Signed)
Rx faxed to [facility] pharmacy.  

## 2015-02-04 NOTE — Telephone Encounter (Signed)
Okay to change to 3 times a day, ok 3 month supply and 1 RF

## 2015-02-04 NOTE — Telephone Encounter (Signed)
Lyrica increased to TID, Rx printed, awaiting MD signature.

## 2015-02-04 NOTE — Telephone Encounter (Signed)
Pharmacy: [id number] - , [place] - [phone]  Reason for call: Pt has 3-4 days of lyrica 75 mg left. He said Dr. [name] authorized him to take 3/day now. His RX is for 2/day. He is needing updated RX sent to pharmacy so he can get refill.

## 2015-02-04 NOTE — Telephone Encounter (Signed)
Please advise 

## 2015-02-05 ENCOUNTER — Other Ambulatory Visit: Payer: Self-pay | Admitting: Internal Medicine

## 2015-02-05 NOTE — Telephone Encounter (Signed)
Pt is requesting refill on Trazodone.  Last OV: 01/07/2015 Last Fill: 09/20/2014 #30 and 3RF UDS: None  Please advise.

## 2015-02-05 NOTE — Telephone Encounter (Signed)
Refills sent

## 2015-02-05 NOTE — Telephone Encounter (Signed)
Okay #30 and 5 refills 

## 2015-02-08 ENCOUNTER — Encounter: Payer: Self-pay | Admitting: Internal Medicine

## 2015-02-08 ENCOUNTER — Ambulatory Visit (INDEPENDENT_AMBULATORY_CARE_PROVIDER_SITE_OTHER): Payer: Medicare Other | Admitting: Internal Medicine

## 2015-02-08 VITALS — BP 126/74 | HR 60 | Temp 98.2°F | Ht 69.0 in | Wt 185.0 lb

## 2015-02-08 DIAGNOSIS — Z89511 Acquired absence of right leg below knee: Secondary | ICD-10-CM | POA: Diagnosis not present

## 2015-02-08 DIAGNOSIS — G629 Polyneuropathy, unspecified: Secondary | ICD-10-CM | POA: Diagnosis not present

## 2015-02-08 DIAGNOSIS — R399 Unspecified symptoms and signs involving the genitourinary system: Secondary | ICD-10-CM

## 2015-02-08 DIAGNOSIS — Z09 Encounter for follow-up examination after completed treatment for conditions other than malignant neoplasm: Secondary | ICD-10-CM

## 2015-02-08 MED ORDER — TAMSULOSIN HCL 0.4 MG PO CAPS: 0.4000 mg | ORAL_CAPSULE | Freq: Every day | ORAL | Status: DC

## 2015-02-08 NOTE — Progress Notes (Signed)
Subjective:    Patient ID: Gene [name], male    DOB: 1940-09-17, 74 y.o.   MRN: [id number]  DOS:  02/08/2015 Type of visit - description : Acute Interval history: Concern about edema at the stump on the right leg, he saw  his orthopedic doctor yesterday and was told  it looks okay to put the  prosthesis . Continue with urinary symptoms, nocturia and urinary frequency. No dysuria or gross hematuria. Show some on Lyrica. Take it bid? Tid? States that neuropathy symptoms are okay with twice a day but he has occasional pain at the right fifth finger and increasing the dose to 3 times a day help to some extent that pain   Review of Systems Denies chest pain or difficulty breathing. Weight is checked, he  is 1 pound lighter compared to last visit  Past Medical History  Diagnosis Date  . Anxiety and depression 1995    symptoms started aprox 1995, after a divorce  . Hyperlipidemia     high TG  . Hypertension   . Neuropathy (HCC)   . 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 History of alcohol abuse   . 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 Family history of anesthesia complication     "father would start seeing things"  . Anxiety   . Arthritis   . Ulcer of esophagus   . Heart murmur     as a baby " outgrew" " small one per Dr Drue Novel.  . PVD (peripheral vascular disease) (HCC)     PVD, mild saw Dr Allyson Sabal 2012, intolerant to pletal, Rx observation  . History of blood transfusion   . Anemia     Past Surgical History  Procedure Laterality Date  . Cholecystectomy    . Tonsillectomy    . Amputation Left 11/20/13 and 11/24/13    middle  . Amputation Left 01/03/2014    Procedure: Left Transmetatarsal Amputation;  Surgeon: Nadara Mustard, MD;  Location: [facility] OR;  Service: Orthopedics;  Laterality: Left;  . Transmetatarsal amputation Left 10/2013  . Colonoscopy    . Amputation Right 07/27/2014    Procedure: Right 4th Ray Amputation;   Surgeon: Nadara Mustard, MD;  Location: [facility] OR;  Service: Orthopedics;  Laterality: Right;  . Esophagogastroduodenoscopy N/A 08/16/2014    Procedure: ESOPHAGOGASTRODUODENOSCOPY (EGD);  Surgeon: Vida Rigger, MD;  Location: [facility] ENDOSCOPY;  Service: Endoscopy;  Laterality: N/A;  . Esophagogastroduodenoscopy (egd) with propofol N/A 08/17/2014    Procedure: ESOPHAGOGASTRODUODENOSCOPY (EGD) WITH PROPOFOL;  Surgeon: Graylin Shiver, MD;  Location: [facility] ENDOSCOPY;  Service: Endoscopy;  Laterality: N/A;  . Toe amputation 4th r  07-27-2014  . Transmetatarsal amputation Right 07/06/02016  . Amputation Right 09/12/2014    Procedure: RIGHT TRANSMETATARSAL AMPUTATION;  Surgeon: Nadara Mustard, MD;  Location: [facility] OR;  Service: Orthopedics;  Laterality: Right;  . Amputation Right 09/28/2014    Procedure: Right Below Knee Amputation;  Surgeon: Nadara Mustard, MD;  Location: [facility] OR;  Service: Orthopedics;  Laterality: Right;    Social History   Social History  . Marital Status: Divorced    Spouse Name: N/A  . Number of Children: 3  . Years of Education: College   Occupational History  . 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 Smoking status: Never Smoker   . Smokeless tobacco:  Never Used  . Alcohol Use: No     Comment: h/o abuse, now on weekends, slt heavier x last few months   States no alcohol  . Drug Use: No  . Sexual Activity: Not on file   Other Topics Concern  . Not on file   Social History Narrative    Divorced since 1994; lives by himself, 3 sons, 1 GD  (all live close by)   [place]          Medication List       This list is accurate as of: 02/08/15 11:59 PM.  Always use your most recent med list.               acetaminophen 325 MG tablet  Commonly known as:  TYLENOL  Take 650 mg by mouth every 6 (six) hours as needed (pain).     Alpha-Lipoic Acid 600 MG Caps  Take 1 capsule by mouth daily.     ALPRAZolam 0.5 MG tablet  Commonly  known as:  XANAX  Take 1 tablet (0.5 mg total) by mouth 3 (three) times daily.     amLODipine 5 MG tablet  Commonly known as:  NORVASC  Take 1 tablet (5 mg total) by mouth daily.     atorvastatin 40 MG tablet  Commonly known as:  LIPITOR  Take 1 tablet (40 mg total) by mouth at bedtime.     b complex vitamins capsule  Take 1 capsule by mouth daily.     Biotin 10 MG Caps  Take 10 mg by mouth daily.     citalopram 20 MG tablet  Commonly known as:  CELEXA  Take 40 mg by mouth daily.     Collagen Hydrolysate Powd  by Does not apply route. As directed.     fenofibrate 160 MG tablet  Take 1 tablet (160 mg total) by mouth daily.     Krill Oil 1000 MG Caps  Take 1,000 mg by mouth daily.     losartan 100 MG tablet  Commonly known as:  COZAAR  Take 1 tablet (100 mg total) by mouth daily.     METAMUCIL PO  Take 2 capsules by mouth daily.     oxymetazoline 0.05 % nasal spray  Commonly known as:  AFRIN  Place 1 spray into both nostrils 2 (two) times daily as needed for congestion.     pregabalin 75 MG capsule  Commonly known as:  LYRICA  Take 75 mg by mouth 2 (two) times daily.     PROBIOTIC DAILY Caps  Take 1 capsule by mouth daily.     RA ASPIRIN EC 81 MG EC tablet  Generic drug:  aspirin  Take 81 mg by mouth daily.     tamsulosin 0.4 MG Caps capsule  Commonly known as:  FLOMAX  Take 1 capsule (0.4 mg total) by mouth daily.     traZODone 50 MG tablet  Commonly known as:  DESYREL  take 1 tablet by mouth at bedtime     VITAMIN C & E COMBINATION PO  Take 3,000 mg by mouth.     vitamin E 600 UNIT capsule  Take 600 Units by mouth daily.           Objective:   Physical Exam BP 126/74 mmHg  Pulse 60  Temp(Src) 98.2 F (36.8 C) (Oral)  Ht 5\' 9"  (1.753 m)  Wt 185 lb (83.915 kg)  BMI 27.31 kg/m2  SpO2 96% General:   Well developed,  well nourished . NAD.  HEENT:  Normocephalic . Face symmetric, atraumatic Lower extremities: Trace pretibial edema on the  pretibial  left Right leg: Trace edema at the stump Neurologic:  alert & oriented X3.  Speech normal Psych--  Cognition and judgment appear intact.  Cooperative with normal attention span and concentration.  Behavior appropriate. No anxious or depressed appearing.      Assessment & Plan:   Assessement > Hyperglycemia,  (A1c 5.8  2013) Neuropathy , peripheral: d/t ETOH (per neuro note, Dr Anne Hahn,  2014) NCS 05-2011: Moderate neuropathy, low-grade right L5 radiculopathy Bilateral ulnar neuropathy HTN Hyperlipidemia Anxiety, depression onset 1995 after a divorce Aortic stenosis: Mild per echocardiogram 12-2014, has a soft murmur Peripheral vascular disease --- per Dr. Allyson Sabal . Pletal intolerant, ABIs normal but vessels are noncompressible MSK: --Osteomyelitis --Multiple toes amputations 2014 --Left transmetatarsal amputation 01/03/2014 (osteomyelitis) --Right transmetatarsal amputation 09/12/2014 -- R BKA  09/28/2014 d/  Dehiscence midfoot amputation GI bleed, admited 08-2014 >>> EGD  esophageal ulcer (d/t nsaids and doxycycline ?) H/o Alcohol abuse   PLAN LUTS: Again reported symptoms, recent UA, PSA and DRE negative. Recommend a trial of Flomax. Neuropathy: Recently requested to increase Lyrica to 3 times a day, see phone note however symptoms are well-controlled w/ BID, recommend to stay on twice a day Status post R BKA: Has minimal edema at this time, his weight is stable, was seen yesterday by orthopedic surgery on felt to be okay to proceed with prosthetic despite mild edema.

## 2015-02-08 NOTE — Progress Notes (Signed)
Pre visit review using our clinic review tool, if applicable. No additional management support is needed unless otherwise documented below in the visit note. 

## 2015-02-08 NOTE — Patient Instructions (Signed)
For the urinary symptoms, take Flomax 1 tablet at bedtime  Take Lyrica twice a day only

## 2015-02-10 NOTE — Assessment & Plan Note (Signed)
LUTS: Again reported symptoms, recent UA, PSA and DRE negative. Recommend a trial of Flomax. Neuropathy: Recently requested to increase Lyrica to 3 times a day, see phone note however symptoms are well-controlled w/ BID, recommend to stay on twice a day Status post R BKA: Has minimal edema at this time, his weight is stable, was seen yesterday by orthopedic surgery on felt to be okay to proceed with prosthetic despite mild edema.

## 2015-03-05 ENCOUNTER — Other Ambulatory Visit: Payer: Self-pay | Admitting: Internal Medicine

## 2015-03-05 NOTE — Telephone Encounter (Signed)
Pt is requesting refill on Alprazolam.  Last OV: 02/08/2015 Last Fill: 01/04/2015 #90 and 1RF Pt sig: 1 tablet tid PRN UDS: 04/15/2012 Low risk   Please advise.

## 2015-03-05 NOTE — Telephone Encounter (Signed)
Rx printed, awaiting MD signature.  

## 2015-03-05 NOTE — Telephone Encounter (Signed)
Okay #90 and 1 refill. I can sign upon return from sick leave or if you find another provider to sign that is okay

## 2015-03-06 ENCOUNTER — Encounter: Payer: Self-pay | Admitting: Internal Medicine

## 2015-03-07 NOTE — Telephone Encounter (Signed)
Rx faxed to [facility]

## 2015-03-29 ENCOUNTER — Ambulatory Visit (INDEPENDENT_AMBULATORY_CARE_PROVIDER_SITE_OTHER): Payer: Medicare Other | Admitting: Podiatry

## 2015-03-29 ENCOUNTER — Encounter: Payer: Self-pay | Admitting: Podiatry

## 2015-03-29 ENCOUNTER — Ambulatory Visit: Payer: Medicare Other | Admitting: Podiatry

## 2015-03-29 VITALS — BP 102/46 | HR 66 | Resp 18

## 2015-03-29 DIAGNOSIS — G629 Polyneuropathy, unspecified: Secondary | ICD-10-CM | POA: Diagnosis not present

## 2015-03-29 DIAGNOSIS — Z899 Acquired absence of limb, unspecified: Secondary | ICD-10-CM | POA: Diagnosis not present

## 2015-04-01 ENCOUNTER — Encounter: Payer: Self-pay | Admitting: Podiatry

## 2015-04-01 ENCOUNTER — Ambulatory Visit: Payer: Medicare Other | Admitting: Podiatry

## 2015-04-01 NOTE — Progress Notes (Signed)
Patient ID: Gene [name], male   DOB: 15-May-1940, 75 y.o.   MRN: [id number]  Subjective: 75 year old male presents the office today for diabetic risk assessment. He states that the wound on the right BKA site is healed he is being fitted for prosthetic. Also since last appointment he has had hand controls. In his car and he is back to driving. He states he is doing well to his left foot he has not noticed any redness, calluses, or any open sores. He does continue follow-up with Dr. Lajoyce Corners as well. No other complaints at this time.  Objective: AAO x 3, NAD DP/PT pulses palpable on the left side. There is an immediate capillary refill time to the flap of the transmetatarsal amputation. 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 Range of motion is intact the ankle joint without any restriction. Subtalar joint range of motion intact. Mild equinus. There is no erythema to the foot and there is no pre-ulcer lesions or ulcerations identified at this time. All the skin is intact. No areas of tenderness to the left lower extremity.   There is no pain with calf compression, swelling, warmth, erythema.  Assessment: 75 year old male with left transmetatarsal amputation with no signs of ulceration; right transtibial amputation.  Plan: -Treatment options discussed including all alternatives, risks, and complications -At this time, there does not appear to be any infection or ulceration on the left side.Continue monitor closely.  Continue follow-up with Dr. Lajoyce Corners for the right transtibial amputation. -Achilles exercises  -Continue to monitor daily for any skin breakdown or any changes. If his any problems to call the office. -Follow-up in 3 months at his request to check on the left foot or sooner if any problems arise. In the meantime, encouraged to call the office with any questions, concerns, change in symptoms.   Ovid Curd, DPM

## 2015-04-04 ENCOUNTER — Encounter: Payer: Self-pay | Admitting: Cardiovascular Disease

## 2015-04-04 ENCOUNTER — Encounter: Payer: Self-pay | Admitting: Internal Medicine

## 2015-04-08 ENCOUNTER — Ambulatory Visit: Payer: Medicare Other | Admitting: Podiatry

## 2015-04-09 ENCOUNTER — Encounter: Payer: Self-pay | Admitting: Cardiovascular Disease

## 2015-04-12 ENCOUNTER — Ambulatory Visit: Payer: Medicare Other | Admitting: Podiatry

## 2015-04-18 ENCOUNTER — Encounter: Payer: Self-pay | Admitting: Physical Therapy

## 2015-04-18 ENCOUNTER — Ambulatory Visit: Payer: Medicare Other | Attending: Orthopedic Surgery | Admitting: Physical Therapy

## 2015-04-18 DIAGNOSIS — R6889 Other general symptoms and signs: Secondary | ICD-10-CM

## 2015-04-18 DIAGNOSIS — Z89511 Acquired absence of right leg below knee: Secondary | ICD-10-CM | POA: Diagnosis present

## 2015-04-18 DIAGNOSIS — R2681 Unsteadiness on feet: Secondary | ICD-10-CM

## 2015-04-18 DIAGNOSIS — M623 Immobility syndrome (paraplegic): Secondary | ICD-10-CM | POA: Insufficient documentation

## 2015-04-18 DIAGNOSIS — R531 Weakness: Secondary | ICD-10-CM | POA: Diagnosis present

## 2015-04-18 DIAGNOSIS — R269 Unspecified abnormalities of gait and mobility: Secondary | ICD-10-CM

## 2015-04-18 DIAGNOSIS — Z89432 Acquired absence of left foot: Secondary | ICD-10-CM | POA: Diagnosis present

## 2015-04-18 DIAGNOSIS — R29818 Other symptoms and signs involving the nervous system: Secondary | ICD-10-CM | POA: Insufficient documentation

## 2015-04-18 DIAGNOSIS — Z7409 Other reduced mobility: Secondary | ICD-10-CM

## 2015-04-18 DIAGNOSIS — Z5189 Encounter for other specified aftercare: Secondary | ICD-10-CM | POA: Diagnosis present

## 2015-04-18 DIAGNOSIS — M2560 Stiffness of unspecified joint, not elsewhere classified: Secondary | ICD-10-CM

## 2015-04-18 DIAGNOSIS — R2689 Other abnormalities of gait and mobility: Secondary | ICD-10-CM

## 2015-04-18 DIAGNOSIS — Z4789 Encounter for other orthopedic aftercare: Secondary | ICD-10-CM

## 2015-04-18 NOTE — Therapy (Signed)
[facility] [address] [place], [place], [address] Phone: [phone]   Fax:  [phone]  Physical Therapy Evaluation  Patient Details  Name: Gene [name] MRN: [id number] Date of Birth: 1940/03/20 Referring Provider: Aldean Baker, MD  Encounter Date: 04/18/2015      PT End of Session - 04/18/15 1205    Visit Number 1   Number of Visits 17   Date for PT Re-Evaluation 06/17/15   Authorization Type Medicare G-code & progress note every 10th visit   PT Start Time 0750   PT Stop Time 0845   PT Time Calculation (min) 55 min   Equipment Utilized During Treatment Gait belt   Activity Tolerance Patient tolerated treatment well   Behavior During Therapy [facility] for tasks assessed/performed      Past Medical History  Diagnosis Date  . Anxiety and depression 1995    symptoms started aprox 1995, after a divorce  . Hyperlipidemia     high TG  . Hypertension   . Neuropathy (HCC)   . 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 History of alcohol abuse   . 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 Family history of anesthesia complication     "father would start seeing things"  . Anxiety   . Arthritis   . Ulcer of esophagus   . Heart murmur     as a baby " outgrew" " small one per Dr Drue Novel.  . PVD (peripheral vascular disease) (HCC)     PVD, mild saw Dr Allyson Sabal 2012, intolerant to pletal, Rx observation  . History of blood transfusion   . Anemia     Past Surgical History  Procedure Laterality Date  . Cholecystectomy    . Tonsillectomy    . Amputation Left 11/20/13 and 11/24/13    middle  . Amputation Left 01/03/2014    Procedure: Left Transmetatarsal Amputation;  Surgeon: Nadara Mustard, MD;  Location: [facility] OR;  Service: Orthopedics;  Laterality: Left;  . Transmetatarsal amputation Left 10/2013  . Colonoscopy    . Amputation Right 07/27/2014    Procedure: Right 4th Ray  Amputation;  Surgeon: Nadara Mustard, MD;  Location: [facility] OR;  Service: Orthopedics;  Laterality: Right;  . Esophagogastroduodenoscopy N/A 08/16/2014    Procedure: ESOPHAGOGASTRODUODENOSCOPY (EGD);  Surgeon: Vida Rigger, MD;  Location: [facility] ENDOSCOPY;  Service: Endoscopy;  Laterality: N/A;  . Esophagogastroduodenoscopy (egd) with propofol N/A 08/17/2014    Procedure: ESOPHAGOGASTRODUODENOSCOPY (EGD) WITH PROPOFOL;  Surgeon: Graylin Shiver, MD;  Location: [facility] ENDOSCOPY;  Service: Endoscopy;  Laterality: N/A;  . Toe amputation 4th r  07-27-2014  . Transmetatarsal amputation Right 07/06/02016  . Amputation Right 09/12/2014    Procedure: RIGHT TRANSMETATARSAL AMPUTATION;  Surgeon: Nadara Mustard, MD;  Location: [facility] OR;  Service: Orthopedics;  Laterality: Right;  . Amputation Right 09/28/2014    Procedure: Right Below Knee Amputation;  Surgeon: Nadara Mustard, MD;  Location: [facility] OR;  Service: Orthopedics;  Laterality: Right;    There were no vitals filed for this visit.  Visit Diagnosis:  Abnormality of gait  Unsteadiness  Balance problems  Weakness generalized  Stiffness due to immobility  Decreased functional activity tolerance  Encounter for prosthetic gait training  Status post below knee amputation of right lower extremity (HCC)  S/P transmetatarsal amputation of foot, left (HCC)      Subjective Assessment - 04/18/15 6962  Subjective This 74yo male developed wound on right foot with 4th ray amputation that did not heal and underwent a right Transtibial Amputation on 09/12/2014. He recieved his first prosthesis on 04/15/2015. He is dependent in use & care. He presents for PT evaluation.    Pertinent History HTN, Neuropathy, hx of alcohol abuse, Ulnar nerve lesions bil. , arthritis, PVD, Left foot Trans Metatarsal amputation 01/03/2014.    Patient Stated Goals He wants to use prosthesis to get around home & community.    Currently in Pain? No/denies            [facility] PT Assessment - 04/18/15  0800    Assessment   Medical Diagnosis Right Transtibial Amputation   Referring Provider Aldean Baker, MD   Onset Date/Surgical Date 04/15/15  Prosthesis delivery   Hand Dominance Right   Precautions   Precautions Fall   Restrictions   Weight Bearing Restrictions No   Balance Screen   Has the patient fallen in the past 6 months Yes   How many times? 1   Has the patient had a decrease in activity level because of a fear of falling?  No   Is the patient reluctant to leave their home because of a fear of falling?  No   Home Environment   Living Environment Private residence   Living Arrangements Alone   Type of Home House   Home Access Ramped entrance;Stairs to enter  main level has ramp or 3 steps secondary entrance   Entrance Stairs-Number of Steps 2-3   Entrance Stairs-Rails None   Home Layout Multi-level  3 levels enter on middle level, no bathrrom on main level   Alternate Level Stairs-Number of Steps 8   Alternate Level Stairs-Rails Right  right has chair lift for both stairs.   Home Equipment Walker - 2 wheels;Crutches;Tub bench;Grab bars - tub/shower;Wheelchair - manual   Prior Function   Level of Independence Independent;Independent with household mobility without device;Independent with community mobility without device;Independent with gait  no device >4 blocks if needed   Vocation Retired   Observation/Other Assessments   Focus on Therapeutic Outcomes (FOTO)  41.45% Functional Status   Fear Avoidance Belief Questionnaire (FABQ)  33 (10)   Posture/Postural Control   Posture/Postural Control Postural limitations   Postural Limitations Rounded Shoulders;Forward head;Increased lumbar lordosis;Increased thoracic kyphosis;Flexed trunk;Weight shift left   ROM / Strength   AROM / PROM / Strength AROM;Strength   AROM   Overall AROM  Deficits;Within functional limits for tasks performed   Overall AROM Comments UEs WFL, hips appear tight flexors   AROM Assessment Site Knee    Right/Left Knee Right   Right Knee Flexion -8   Strength   Overall Strength Within functional limits for tasks performed   Overall Strength Comments gross MMT in sitting 5/5 but functionally hips appear weak   Transfers   Transfers Sit to Stand;Stand to Sit   Sit to Stand 5: Supervision;With upper extremity assist;With armrests;From chair/3-in-1  to RW for stablization   Stand to Sit 5: Supervision;With upper extremity assist;With armrests;To chair/3-in-1  RW for stabilization   Squat Pivot Transfers 6: Modified independent (Device/Increase time);With upper extremity assistance  car to w/c without prosthesis   Ambulation/Gait   Ambulation/Gait Yes   Ambulation/Gait Assistance 5: Supervision   Ambulation/Gait Assistance Details partial wt on prosthesis   Ambulation Distance (Feet) 100 Feet   Assistive device Prosthesis;Rolling walker   Gait Pattern Step-to pattern;Decreased step length - left;Decreased stance time - right;Decreased stride  length;Decreased hip/knee flexion - right;Decreased weight shift to right;Right hip hike;Right flexed knee in stance;Antalgic;Trunk flexed;Trunk rotated posteriorly on right;Abducted- right;Poor foot clearance - right   Ambulation Surface Indoor;Level   Gait velocity 0.88 ft/sec  <1.8 ft/sec indicates fall risk   Standardized Balance Assessment   Standardized Balance Assessment Berg Balance Test   Berg Balance Test   Sit to Stand Needs minimal aid to stand or to stabilize  RW required to stabilize   Standing Unsupported Able to stand 2 minutes with supervision   Sitting with Back Unsupported but Feet Supported on Floor or Stool Able to sit safely and securely 2 minutes   Stand to Sit Sits independently, has uncontrolled descent  uses RW to stabilize   Transfers Able to transfer safely, definite need of hands   Standing Unsupported with Eyes Closed Unable to keep eyes closed 3 seconds but stays steady  with RW support >10sec with supervision    Standing Ubsupported with Feet Together Needs help to attain position and unable to hold for 15 seconds  with RW 1 minute with supervision   From Standing, Reach Forward with Outstretched Arm Loses balance while trying/requires external support  With RW reaches 5"    From Standing Position, Pick up Object from Floor Unable to try/needs assist to keep balance  With RW picks up with supervision   From Standing Position, Turn to Look Behind Over each Shoulder Needs assist to keep from losing balance and falling  With RW looks to sides only   Turn 360 Degrees Needs assistance while turning  With RW needs close supervision   Standing Unsupported, Alternately Place Feet on Step/Stool Needs assistance to keep from falling or unable to try  with RW completes 4 steps with supervision   Standing Unsupported, One Foot in [employer] balance while stepping or standing  with RW steps forward small step   Standing on One Leg Unable to try or needs assist to prevent fall  RW stands on left LE 10 sec with supervision   Total Score 13   Berg comment: <36/56 indicates dependent in ADLs & <45/56 indicates high fall risk         Prosthetics Assessment - 04/18/15 0800    Prosthetics   Prosthetic Care Dependent with Skin check;Residual limb care;Prosthetic cleaning;Ply sock cleaning;Correct ply sock adjustment;Proper wear schedule/adjustment;Proper weight-bearing schedule/adjustment   Prosthetic Care Comments  left transmetatarsal amputation has carbon plate with partial foot prosthesis   Donning prosthesis  Supervision   Doffing prosthesis  Modified independent (Device/Increase time)   Current prosthetic wear tolerance (days/week)  wore prosthesis 1x since delivery 3 days ago   Current prosthetic wear tolerance (#hours/day)  one wear for 1 hr. PT instructed initiate 2hrs 2x/day with weekly increase if no problems   Current prosthetic weight-bearing tolerance (hours/day)  tolerated 5 minutes with partial wt  on prosthesis with no c/o pain or discomfort.    Edema non-pitting on residual limb   Residual limb condition  scabs on medial & lateral incision line 1.5 X 2.0 cm no signs of infection, dry, red scaly skin distal limb, no hair growth,    K code/activity level with prosthetic use  3                  [facility] Adult PT Treatment/Exercise - 04/18/15 0800    Prosthetics   Education Provided Skin check;Residual limb care;Prosthetic cleaning;Care of non-amputated limb;Correct ply sock adjustment;Proper Donning;Proper Doffing;Proper wear schedule/adjustment;Proper weight-bearing schedule/adjustment   Person(s)  Educated Patient   Education Method Explanation;Demonstration;Tactile cues;Verbal cues   Education Method Verbalized understanding;Returned demonstration;Tactile cues required;Verbal cues required;Needs further instruction                  PT Short Term Goals - 04/18/15 1223    PT SHORT TERM GOAL #1   Title Patient tolerates wear >8 hours total /day without negative changes to current wounds & no new wounds. (Target Date: 05/17/2015)   Time 4   Period Weeks   Status New   PT SHORT TERM GOAL #2   Title Patient verbalizes proper cleaning of prosthesis and demonstrates proper donning modified independent. (Target Date: 05/17/2015)   Time 4   Period Months   Status New   PT SHORT TERM GOAL #3   Title Patient reaches 5" anteriorly, laterally, across midline & to floor without UE support with supervision. (Target Date: 05/17/2015)   Time 4   Period Weeks   Status New   PT SHORT TERM GOAL #4   Title Patient ambulates 300' & negotiates ramps /curbs with RW / prosthesis modified independent. (Target Date: 05/17/2015)   Time 4   Period Weeks   Status New   PT SHORT TERM GOAL #5   Title Patient ambulates 100' with single point cane & prosthesis with minimal assist. (Target Date: 05/17/2015)   Time 4   Period Weeks   Status New           PT Long Term Goals - 04/18/15  1219    PT LONG TERM GOAL #1   Title Patient is independent in prosthetic care to enable safe use of prosthesis. (Target Date: 06/14/2015)   Time 8   Period Weeks   Status New   PT LONG TERM GOAL #2   Title Patient tolerates wear of prosthesis >90% of awake hours without skin intergrity issues or limb tenderness to enable function most of awake hours. (Target Date: 06/14/2015)   Time 8   Period Weeks   Status New   PT LONG TERM GOAL #3   Title Berg Balance >45/56 to indicate lower fall risk. (Target Date: 06/14/2015)   Time 8   Period Weeks   Status New   PT LONG TERM GOAL #4   Title Patient ambulates 100' carrying laundry basket around furniture with prosthesis only modified indepenent. (Target Date: 06/14/2015)   Time 8   Period Weeks   Status New   PT LONG TERM GOAL #5   Title Patient ambulates 500' including grass, ramps, curbs & stairs (1 rail) with single point cane & prosthesis modified independent. (Target Date: 06/14/2015)   Time 8   Period Weeks   Status New               Plan - 04/18/15 1208    Clinical Impression Statement This 74yo male has been immobile for 10 months due to wounds & progressive amputation surgeries causing deconditioning. He recieved his first prosthesis 3 days prior to PT evalaution with wear only one time for 1 hr. Limited wear limits potential to function throughout his awake hours. He is dependent in safe use & care of prosthesis with risk of skin breakdown or limb pain with dependency. Berg Balance of 13/56 indicates high fall risk & dependency in ADLs. His gait is unsafe & dependent on bilateral UE support of RW. He is non-ambulatory on barriers. Gait Velocity of 0.40ft/sec indicates fall risk. Patient condition is evolving and plan of care has moderate complexity.  Pt will benefit from skilled therapeutic intervention in order to improve on the following deficits Abnormal gait;Decreased activity tolerance;Decreased balance;Decreased  endurance;Decreased knowledge of use of DME;Decreased mobility;Decreased range of motion;Decreased strength;Postural dysfunction;Prosthetic Dependency   Rehab Potential Good   PT Frequency 2x / week   PT Duration 8 weeks   PT Treatment/Interventions ADLs/Self Care Home Management;DME Instruction;Gait training;Stair training;Functional mobility training;Therapeutic activities;Therapeutic exercise;Balance training;Neuromuscular re-education;Patient/family education;Prosthetic Training;Vestibular   PT Next Visit Plan Review prosthetic care, prosthetic gait with RW including barriers, Mid-line HEP at sink   Consulted and Agree with Plan of Care Patient          G-Codes - 04/28/15 07/25/16    Functional Assessment Tool Used Patient is dependent in prosthetic care. Tolerates wear only 1 time for 1 hour.   Functional Limitation Self care   Self Care Current Status [phone]) At least 80 percent but less than 100 percent impaired, limited or restricted   Self Care Goal Status (W0981) At least 1 percent but less than 20 percent impaired, limited or restricted       Problem List Patient Active Problem List   Diagnosis Date Noted  . Status post amputation 01/28/2015  . Neuropathy (HCC) 01/28/2015  . PCP NOTES >>>> 01/08/2015  . Below knee amputation status (HCC) 09/28/2014  . Osteomyelitis (HCC) 08/29/2014  . 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 Generalized weakness   . GI bleed 08/15/2014  . Acute blood loss anemia 08/15/2014  . Acute GI bleeding 08/15/2014  . Gastrointestinal hemorrhage with melena   . Prediabetes 07/20/2014  . Heart murmur 05/11/2014  . S/P transmetatarsal amputation of foot (HCC) 01/03/2014  . PVD (peripheral vascular disease) (HCC) 01/20/2013  . Lesion of ulnar nerve 07/22/2012  . Edema 10/09/2011  . Hyperglycemia 10/09/2011  . Hemorrhoid 10/09/2011  . DJD (degenerative joint disease) 07/10/2011  . Peripheral neuropathy (HCC) 04/17/2011  . Alcohol abuse  01/22/2011  . Annual physical exam 09/26/2010  . FATIGUE 09/14/2008  . ERECTILE DYSFUNCTION 01/26/2008  . Hyperlipidemia 06/11/2006  . Anxiety and depression 06/11/2006  . HTN (hypertension) 06/11/2006  . s/t transmetatarsal amputation due to osteomyelitis 06/11/2006  . Insomnia 06/11/2006    Maricsa Sammons PT, DPT 04-28-2015, 12:28 PM  [place] [facility] [address] [place], [place], [address] Phone: [phone]   Fax:  [phone]  Name: Gene [name] MRN: [id number] Date of Birth: [birth year]

## 2015-04-22 ENCOUNTER — Telehealth: Payer: Self-pay | Admitting: Cardiovascular Disease

## 2015-04-22 ENCOUNTER — Ambulatory Visit: Payer: Medicare Other | Admitting: Physical Therapy

## 2015-04-22 ENCOUNTER — Encounter: Payer: Self-pay | Admitting: Physical Therapy

## 2015-04-22 DIAGNOSIS — R269 Unspecified abnormalities of gait and mobility: Secondary | ICD-10-CM

## 2015-04-22 DIAGNOSIS — M2560 Stiffness of unspecified joint, not elsewhere classified: Secondary | ICD-10-CM

## 2015-04-22 DIAGNOSIS — Z7409 Other reduced mobility: Secondary | ICD-10-CM

## 2015-04-22 DIAGNOSIS — R6889 Other general symptoms and signs: Secondary | ICD-10-CM

## 2015-04-22 DIAGNOSIS — R2681 Unsteadiness on feet: Secondary | ICD-10-CM

## 2015-04-22 DIAGNOSIS — Z4789 Encounter for other orthopedic aftercare: Secondary | ICD-10-CM

## 2015-04-22 DIAGNOSIS — R2689 Other abnormalities of gait and mobility: Secondary | ICD-10-CM

## 2015-04-22 DIAGNOSIS — R531 Weakness: Secondary | ICD-10-CM

## 2015-04-22 NOTE — Patient Instructions (Addendum)

## 2015-04-22 NOTE — Telephone Encounter (Signed)
Spoke to patient Informed patient he had a echo 12/2014. Possible made need nuclear stress test or cardiac ct to see if any plaque within the coronary arteries patient states has has lost some toes as well, amputation BKA. Patient states he just need to know what to tell [facility].

## 2015-04-22 NOTE — Telephone Encounter (Signed)
New message     Sent Dr Allyson Sabal a mychart question.  [facility]  Pt would like this question answered soon so that he would have an answer for the [place].  Please call

## 2015-04-24 NOTE — Therapy (Signed)
[facility] [address] [place], [place], [address] Phone: [phone]   Fax:  [phone]  Physical Therapy Treatment  Patient Details  Name: Gene [name] MRN: [id number] Date of Birth: 21-Jul-1940 Referring Provider: Aldean Baker, MD  Encounter Date: 04/22/2015   04/22/15 1438  PT Visits / Re-Eval  Visit Number 2  Number of Visits 17  Date for PT Re-Evaluation 06/17/15  Authorization  Authorization Type Medicare G-code & progress note every 10th visit  PT Time Calculation  PT Start Time 1400  PT Stop Time 1445  PT Time Calculation (min) 45 min  PT - End of Session  Equipment Utilized During Treatment Gait belt  Activity Tolerance Patient tolerated treatment well  Behavior During Therapy [facility] for tasks assessed/performed     Past Medical History  Diagnosis Date  . Anxiety and depression 1995    symptoms started aprox 1995, after a divorce  . Hyperlipidemia     high TG  . Hypertension   . Neuropathy (HCC)   . 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 History of alcohol abuse   . 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 Family history of anesthesia complication     "father would start seeing things"  . Anxiety   . Arthritis   . Ulcer of esophagus   . Heart murmur     as a baby " outgrew" " small one per Dr Drue Novel.  . PVD (peripheral vascular disease) (HCC)     PVD, mild saw Dr Allyson Sabal 2012, intolerant to pletal, Rx observation  . History of blood transfusion   . Anemia     Past Surgical History  Procedure Laterality Date  . Cholecystectomy    . Tonsillectomy    . Amputation Left 11/20/13 and 11/24/13    middle  . Amputation Left 01/03/2014    Procedure: Left Transmetatarsal Amputation;  Surgeon: Nadara Mustard, MD;  Location: [facility] OR;  Service: Orthopedics;  Laterality: Left;  . Transmetatarsal amputation Left 10/2013  . Colonoscopy    . Amputation Right  07/27/2014    Procedure: Right 4th Ray Amputation;  Surgeon: Nadara Mustard, MD;  Location: [facility] OR;  Service: Orthopedics;  Laterality: Right;  . Esophagogastroduodenoscopy N/A 08/16/2014    Procedure: ESOPHAGOGASTRODUODENOSCOPY (EGD);  Surgeon: Vida Rigger, MD;  Location: [facility] ENDOSCOPY;  Service: Endoscopy;  Laterality: N/A;  . Esophagogastroduodenoscopy (egd) with propofol N/A 08/17/2014    Procedure: ESOPHAGOGASTRODUODENOSCOPY (EGD) WITH PROPOFOL;  Surgeon: Graylin Shiver, MD;  Location: [facility] ENDOSCOPY;  Service: Endoscopy;  Laterality: N/A;  . Toe amputation 4th r  07-27-2014  . Transmetatarsal amputation Right 07/06/02016  . Amputation Right 09/12/2014    Procedure: RIGHT TRANSMETATARSAL AMPUTATION;  Surgeon: Nadara Mustard, MD;  Location: [facility] OR;  Service: Orthopedics;  Laterality: Right;  . Amputation Right 09/28/2014    Procedure: Right Below Knee Amputation;  Surgeon: Nadara Mustard, MD;  Location: [facility] OR;  Service: Orthopedics;  Laterality: Right;    There were no vitals filed for this visit.  Visit Diagnosis:  Abnormality of gait  Unsteadiness  Balance problems  Weakness generalized  Stiffness due to immobility  Decreased functional activity tolerance  Encounter for prosthetic gait training     04/22/15 1410  Symptoms/Limitations  Subjective No new complaints. Reports no pain.   Pertinent History HTN, Neuropathy, hx of alcohol abuse, Ulnar nerve lesions bil. , arthritis, PVD, Left foot  Trans Metatarsal amputation 01/03/2014.   Patient Stated Goals He wants to use prosthesis to get around home & community.   Pain Assessment  Currently in Pain? No/denies      04/22/15 1411  Transfers  Sit to Stand 5: Supervision;With upper extremity assist;With armrests;From chair/3-in-1  Sit to Stand Details Verbal cues for safe use of DME/AE;Verbal cues for sequencing  Stand to Sit 5: Supervision;With upper extremity assist;With armrests;To chair/3-in-1  Stand to Sit Details (indicate cue type  and reason) Verbal cues for safe use of DME/AE;Verbal cues for precautions/safety  Ambulation/Gait  Ambulation/Gait Yes  Ambulation/Gait Assistance 5: Supervision;4: Min guard  Ambulation/Gait Assistance Details cues on posture, step length, weight shifting onto prosthesis in stance phase, to decreased hip abduction on prothetic side for improved base of support with gait  Ambulation Distance (Feet) 100 Feet  Assistive device Prosthesis;Rolling walker  Gait Pattern Step-to pattern;Decreased step length - left;Decreased stance time - right;Decreased stride length;Decreased weight shift to right;Right hip hike;Right flexed knee in stance;Trunk flexed;Abducted- right  Ambulation Surface Level;Indoor  Prosthetics  Prosthetic Care Comments  reinforced wear schedule of 2 hours 2x day for now to allow for gradual build up of tolerance to liner and to protect skin integrity. pt verbalized understanding.                         Current prosthetic wear tolerance (days/week)  worn 2 of 3 days since eval last Thursday  Current prosthetic wear tolerance (#hours/day)  intermittent wear of up to 4 hours straight  Residual limb condition  scabs on medial & lateral incision line 1.5 X 2.0 cm no signs of infection. pt also with open areas lateral to patella (appear to be skin tears/scratches, that are draining/bleeding currently. Tegaderm applied due to drainage with education to pt on how to don tegaderm.                               Education Provided Residual limb care;Correct ply sock adjustment;Proper wear schedule/adjustment;Proper weight-bearing schedule/adjustment  Person(s) Educated Patient  Education Method Explanation;Demonstration;Verbal cues  Education Method Verbalized understanding;Needs further instruction;Verbal cues required  Donning Prosthesis 5  Doffing Prosthesis 5     04/22/15 1437  PT Education  Education provided Yes  Education Details HEP: sink exercises for balance and  proprioception  Person(s) Educated Patient  Methods Explanation;Demonstration;Handout;Verbal cues;Tactile cues  Comprehension Verbalized understanding;Verbal cues required;Tactile cues required         PT Short Term Goals - 04/18/15 1223    PT SHORT TERM GOAL #1   Title Patient tolerates wear >8 hours total /day without negative changes to current wounds & no new wounds. (Target Date: 05/17/2015)   Time 4   Period Weeks   Status New   PT SHORT TERM GOAL #2   Title Patient verbalizes proper cleaning of prosthesis and demonstrates proper donning modified independent. (Target Date: 05/17/2015)   Time 4   Period Months   Status New   PT SHORT TERM GOAL #3   Title Patient reaches 5" anteriorly, laterally, across midline & to floor without UE support with supervision. (Target Date: 05/17/2015)   Time 4   Period Weeks   Status New   PT SHORT TERM GOAL #4   Title Patient ambulates 300' & negotiates ramps /curbs with RW / prosthesis modified independent. (Target Date: 05/17/2015)   Time 4   Period Weeks  Status New   PT SHORT TERM GOAL #5   Title Patient ambulates 5' with single point cane & prosthesis with minimal assist. (Target Date: 05/17/2015)   Time 4   Period Weeks   Status New           PT Long Term Goals - 04/18/15 1219    PT LONG TERM GOAL #1   Title Patient is independent in prosthetic care to enable safe use of prosthesis. (Target Date: 06/14/2015)   Time 8   Period Weeks   Status New   PT LONG TERM GOAL #2   Title Patient tolerates wear of prosthesis >90% of awake hours without skin intergrity issues or limb tenderness to enable function most of awake hours. (Target Date: 06/14/2015)   Time 8   Period Weeks   Status New   PT LONG TERM GOAL #3   Title Berg Balance >45/56 to indicate lower fall risk. (Target Date: 06/14/2015)   Time 8   Period Weeks   Status New   PT LONG TERM GOAL #4   Title Patient ambulates 100' carrying laundry basket around furniture with  prosthesis only modified indepenent. (Target Date: 06/14/2015)   Time 8   Period Weeks   Status New   PT LONG TERM GOAL #5   Title Patient ambulates 500' including grass, ramps, curbs & stairs (1 rail) with single point cane & prosthesis modified independent. (Target Date: 06/14/2015)   Time 8   Period Weeks   Status New        04/22/15 1438  Plan  Clinical Impression Statement Skilled session focused on prosthetic education, gait with prosthesis and HEP at sink for proprioception. Pt able to return demo of sink HEP with minimal cues needed in session on correct form/technique. Pt is making steady progress toward goals.  Pt will benefit from skilled therapeutic intervention in order to improve on the following deficits Abnormal gait;Decreased activity tolerance;Decreased balance;Decreased endurance;Decreased knowledge of use of DME;Decreased mobility;Decreased range of motion;Decreased strength;Postural dysfunction;Prosthetic Dependency  Rehab Potential Good  PT Frequency 2x / week  PT Duration 8 weeks  PT Treatment/Interventions ADLs/Self Care Home Management;DME Instruction;Gait training;Stair training;Functional mobility training;Therapeutic activities;Therapeutic exercise;Balance training;Neuromuscular re-education;Patient/family education;Prosthetic Training;Vestibular  PT Next Visit Plan Review prosthetic care, prosthetic gait with RW including barriers  Consulted and Agree with Plan of Care Patient       Problem List Patient Active Problem List   Diagnosis Date Noted  . Status post amputation 01/28/2015  . Neuropathy (HCC) 01/28/2015  . PCP NOTES >>>> 01/08/2015  . Below knee amputation status (HCC) 09/28/2014  . Osteomyelitis (HCC) 08/29/2014  . 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 Generalized weakness   . GI bleed 08/15/2014  . Acute blood loss anemia 08/15/2014  . Acute GI bleeding 08/15/2014  . Gastrointestinal hemorrhage with melena   . Prediabetes 07/20/2014   . Heart murmur 05/11/2014  . S/P transmetatarsal amputation of foot (HCC) 01/03/2014  . PVD (peripheral vascular disease) (HCC) 01/20/2013  . Lesion of ulnar nerve 07/22/2012  . Edema 10/09/2011  . Hyperglycemia 10/09/2011  . Hemorrhoid 10/09/2011  . DJD (degenerative joint disease) 07/10/2011  . Peripheral neuropathy (HCC) 04/17/2011  . Alcohol abuse 01/22/2011  . Annual physical exam 09/26/2010  . FATIGUE 09/14/2008  . ERECTILE DYSFUNCTION 01/26/2008  . Hyperlipidemia 06/11/2006  . Anxiety and depression 06/11/2006  . HTN (hypertension) 06/11/2006  . s/t transmetatarsal amputation due to osteomyelitis 06/11/2006  . Insomnia 06/11/2006    Sallyanne Kuster 04/24/2015,  12:24 PM  Sallyanne Kuster, PTA, [facility] [address] [place], [place] [address] [phone] 04/24/2015, 12:24 PM   Name: Gene [name] MRN: [id number] Date of Birth: [birth year]

## 2015-04-25 ENCOUNTER — Ambulatory Visit: Payer: Medicare Other | Admitting: Physical Therapy

## 2015-04-25 ENCOUNTER — Encounter: Payer: Self-pay | Admitting: Physical Therapy

## 2015-04-25 DIAGNOSIS — Z89511 Acquired absence of right leg below knee: Secondary | ICD-10-CM

## 2015-04-25 DIAGNOSIS — R269 Unspecified abnormalities of gait and mobility: Secondary | ICD-10-CM | POA: Diagnosis not present

## 2015-04-25 DIAGNOSIS — R531 Weakness: Secondary | ICD-10-CM

## 2015-04-25 DIAGNOSIS — Z4789 Encounter for other orthopedic aftercare: Secondary | ICD-10-CM

## 2015-04-25 DIAGNOSIS — M2560 Stiffness of unspecified joint, not elsewhere classified: Secondary | ICD-10-CM

## 2015-04-25 DIAGNOSIS — R2689 Other abnormalities of gait and mobility: Secondary | ICD-10-CM

## 2015-04-25 DIAGNOSIS — Z7409 Other reduced mobility: Secondary | ICD-10-CM

## 2015-04-25 DIAGNOSIS — R2681 Unsteadiness on feet: Secondary | ICD-10-CM

## 2015-04-25 DIAGNOSIS — R6889 Other general symptoms and signs: Secondary | ICD-10-CM

## 2015-04-25 NOTE — Therapy (Signed)
[facility] [address] [place], [place], [address] Phone: [phone]   Fax:  [phone]  Physical Therapy Treatment  Patient Details  Name: Gene [name] MRN: [id number] Date of Birth: Nov 27, 1940 Referring Provider: Aldean Baker, MD  Encounter Date: 04/25/2015      PT End of Session - 04/25/15 1100    Visit Number 3   Number of Visits 17   Date for PT Re-Evaluation 06/17/15   Authorization Type Medicare G-code & progress note every 10th visit   PT Start Time 1015   PT Stop Time 1100   PT Time Calculation (min) 45 min   Equipment Utilized During Treatment Gait belt   Activity Tolerance Patient tolerated treatment well   Behavior During Therapy [facility] for tasks assessed/performed      Past Medical History  Diagnosis Date  . Anxiety and depression 1995    symptoms started aprox 1995, after a divorce  . Hyperlipidemia     high TG  . Hypertension   . Neuropathy (HCC)   . 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 History of alcohol abuse   . 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 Family history of anesthesia complication     "father would start seeing things"  . Anxiety   . Arthritis   . Ulcer of esophagus   . Heart murmur     as a baby " outgrew" " small one per Dr Drue Novel.  . PVD (peripheral vascular disease) (HCC)     PVD, mild saw Dr Allyson Sabal 2012, intolerant to pletal, Rx observation  . History of blood transfusion   . Anemia     Past Surgical History  Procedure Laterality Date  . Cholecystectomy    . Tonsillectomy    . Amputation Left 11/20/13 and 11/24/13    middle  . Amputation Left 01/03/2014    Procedure: Left Transmetatarsal Amputation;  Surgeon: Nadara Mustard, MD;  Location: [facility] OR;  Service: Orthopedics;  Laterality: Left;  . Transmetatarsal amputation Left 10/2013  . Colonoscopy    . Amputation Right 07/27/2014    Procedure: Right 4th Ray  Amputation;  Surgeon: Nadara Mustard, MD;  Location: [facility] OR;  Service: Orthopedics;  Laterality: Right;  . Esophagogastroduodenoscopy N/A 08/16/2014    Procedure: ESOPHAGOGASTRODUODENOSCOPY (EGD);  Surgeon: Vida Rigger, MD;  Location: [facility] ENDOSCOPY;  Service: Endoscopy;  Laterality: N/A;  . Esophagogastroduodenoscopy (egd) with propofol N/A 08/17/2014    Procedure: ESOPHAGOGASTRODUODENOSCOPY (EGD) WITH PROPOFOL;  Surgeon: Graylin Shiver, MD;  Location: [facility] ENDOSCOPY;  Service: Endoscopy;  Laterality: N/A;  . Toe amputation 4th r  07-27-2014  . Transmetatarsal amputation Right 07/06/02016  . Amputation Right 09/12/2014    Procedure: RIGHT TRANSMETATARSAL AMPUTATION;  Surgeon: Nadara Mustard, MD;  Location: [facility] OR;  Service: Orthopedics;  Laterality: Right;  . Amputation Right 09/28/2014    Procedure: Right Below Knee Amputation;  Surgeon: Nadara Mustard, MD;  Location: [facility] OR;  Service: Orthopedics;  Laterality: Right;    There were no vitals filed for this visit.  Visit Diagnosis:  Abnormality of gait  Unsteadiness  Balance problems  Weakness generalized  Stiffness due to immobility  Decreased functional activity tolerance  Encounter for prosthetic gait training  Status post below knee amputation of right lower extremity (HCC)      Subjective Assessment - 04/25/15 1015    Subjective Wearing prosthesis 2 hrs 2x/day as advised.  Currently in Pain? No/denies                         [facility] Adult PT Treatment/Exercise - 04/25/15 1015    Transfers   Sit to Stand 5: Supervision;With upper extremity assist;With armrests;From chair/3-in-1  chairs without armrests using UEs   Sit to Stand Details Verbal cues for safe use of DME/AE;Verbal cues for sequencing   Sit to Stand Details (indicate cue type and reason) demo, cues on technique with chairs without armrests   Stand to Sit 5: Supervision;With upper extremity assist;With armrests;To chair/3-in-1  chairs without armrests using  UEs   Stand to Sit Details (indicate cue type and reason) Verbal cues for safe use of DME/AE;Verbal cues for precautions/safety   Stand to Sit Details demo, cues on technique with chairs without armrests   Ambulation/Gait   Ambulation/Gait Yes   Ambulation/Gait Assistance 4: Min assist   Ambulation/Gait Assistance Details initial cueing proper step width to decrease abduction with RW; progressed to cane with demo, verbal cues on sequence, posture and balance reactions'; demo, instructed with trial of SBQC, single point cane with regualar tip, quad tip & tripod tip.    Ambulation Distance (Feet) 150 Feet  150' X 2 with RW; 120' X 4 with canes   Assistive device Prosthesis;Rolling walker;Straight cane  trial of various tips on single point cane   Gait Pattern Step-to pattern;Decreased step length - left;Decreased stance time - right;Decreased stride length;Decreased weight shift to right;Right hip hike;Right flexed knee in stance;Trunk flexed;Abducted- right   Ambulation Surface Indoor;Level   Stairs Yes   Stairs Assistance 5: Supervision   Stairs Assistance Details (indicate cue type and reason) PT demo, instructed in sequence, knee flexion for clearance & wt shift   Stair Management Technique Two rails;Step to pattern;Forwards   Number of Stairs 4   Ramp 5: Supervision  RW & prosthesis   Ramp Details (indicate cue type and reason) demo, verbal cues on wt shift technique & posture   Curb 5: Supervision  RW & prosthesis   Curb Details (indicate cue type and reason) demo, verbal cues on technique   Prosthetics   Prosthetic Care Comments  increase wear to 3hrs 2x/day   Current prosthetic wear tolerance (days/week)  daily   Current prosthetic wear tolerance (#hours/day)  wears 2hrs 2x/day PT advised to increase 3hrs 2x/day   Residual limb condition  scabs on medial & lateral incision line 1.5 X 2.0 cm no signs of infection. pt also with open areas lateral to patella (appear to be skin  tears/scratches, that are draining/bleeding currently. Tegaderm applied due to drainage with education to pt on how to don tegaderm.                                Education Provided Residual limb care;Correct ply sock adjustment;Proper wear schedule/adjustment;Proper weight-bearing schedule/adjustment   Person(s) Educated Patient   Education Method Explanation;Verbal cues   Education Method Verbalized understanding                  PT Short Term Goals - 04/18/15 1223    PT SHORT TERM GOAL #1   Title Patient tolerates wear >8 hours total /day without negative changes to current wounds & no new wounds. (Target Date: 05/17/2015)   Time 4   Period Weeks   Status New   PT SHORT TERM GOAL #2   Title  Patient verbalizes proper cleaning of prosthesis and demonstrates proper donning modified independent. (Target Date: 05/17/2015)   Time 4   Period Months   Status New   PT SHORT TERM GOAL #3   Title Patient reaches 5" anteriorly, laterally, across midline & to floor without UE support with supervision. (Target Date: 05/17/2015)   Time 4   Period Weeks   Status New   PT SHORT TERM GOAL #4   Title Patient ambulates 300' & negotiates ramps /curbs with RW / prosthesis modified independent. (Target Date: 05/17/2015)   Time 4   Period Weeks   Status New   PT SHORT TERM GOAL #5   Title Patient ambulates 100' with single point cane & prosthesis with minimal assist. (Target Date: 05/17/2015)   Time 4   Period Weeks   Status New           PT Long Term Goals - 04/18/15 1219    PT LONG TERM GOAL #1   Title Patient is independent in prosthetic care to enable safe use of prosthesis. (Target Date: 06/14/2015)   Time 8   Period Weeks   Status New   PT LONG TERM GOAL #2   Title Patient tolerates wear of prosthesis >90% of awake hours without skin intergrity issues or limb tenderness to enable function most of awake hours. (Target Date: 06/14/2015)   Time 8   Period Weeks   Status New   PT  LONG TERM GOAL #3   Title Berg Balance >45/56 to indicate lower fall risk. (Target Date: 06/14/2015)   Time 8   Period Weeks   Status New   PT LONG TERM GOAL #4   Title Patient ambulates 100' carrying laundry basket around furniture with prosthesis only modified indepenent. (Target Date: 06/14/2015)   Time 8   Period Weeks   Status New   PT LONG TERM GOAL #5   Title Patient ambulates 500' including grass, ramps, curbs & stairs (1 rail) with single point cane & prosthesis modified independent. (Target Date: 06/14/2015)   Time 8   Period Weeks   Status New               Plan - 04/25/15 1100    Clinical Impression Statement Patient was able to ambulate with cane with minA. He prefers single point cane with standard tip. Patient has general understanding of negotiating barriers with modified technique with barriers.    Pt will benefit from skilled therapeutic intervention in order to improve on the following deficits Abnormal gait;Decreased activity tolerance;Decreased balance;Decreased endurance;Decreased knowledge of use of DME;Decreased mobility;Decreased range of motion;Decreased strength;Postural dysfunction;Prosthetic Dependency   Rehab Potential Good   PT Frequency 2x / week   PT Duration 8 weeks   PT Treatment/Interventions ADLs/Self Care Home Management;DME Instruction;Gait training;Stair training;Functional mobility training;Therapeutic activities;Therapeutic exercise;Balance training;Neuromuscular re-education;Patient/family education;Prosthetic Training;Vestibular   PT Next Visit Plan Review prosthetic care, prosthetic gait with RW including barriers   Consulted and Agree with Plan of Care Patient        Problem List Patient Active Problem List   Diagnosis Date Noted  . Status post amputation 01/28/2015  . Neuropathy (HCC) 01/28/2015  . PCP NOTES >>>> 01/08/2015  . Below knee amputation status (HCC) 09/28/2014  . Osteomyelitis (HCC) 08/29/2014  . Near-total blindness  of one eye 08/29/2014  . Generalized weakness   . GI bleed 08/15/2014  . Acute blood loss anemia 08/15/2014  . Acute GI bleeding 08/15/2014  . Gastrointestinal hemorrhage with melena   .  Prediabetes 07/20/2014  . Heart murmur 05/11/2014  . S/P transmetatarsal amputation of foot (HCC) 01/03/2014  . PVD (peripheral vascular disease) (HCC) 01/20/2013  . Lesion of ulnar nerve 07/22/2012  . Edema 10/09/2011  . Hyperglycemia 10/09/2011  . Hemorrhoid 10/09/2011  . DJD (degenerative joint disease) 07/10/2011  . Peripheral neuropathy (HCC) 04/17/2011  . Alcohol abuse 01/22/2011  . Annual physical exam 09/26/2010  . FATIGUE 09/14/2008  . ERECTILE DYSFUNCTION 01/26/2008  . Hyperlipidemia 06/11/2006  . Anxiety and depression 06/11/2006  . HTN (hypertension) 06/11/2006  . s/t transmetatarsal amputation due to osteomyelitis 06/11/2006  . Insomnia 06/11/2006    Nyshawn Gowdy PT, DPT 04/25/2015, 3:45 PM  [facility] [address] [place], [place], [address] Phone: [phone]   Fax:  [phone]  Name: SAMANTHA OLIVERA MRN: [id number] Date of Birth: [birth year]

## 2015-04-29 ENCOUNTER — Encounter: Payer: Self-pay | Admitting: Physical Therapy

## 2015-04-29 ENCOUNTER — Ambulatory Visit: Payer: Medicare Other | Admitting: Physical Therapy

## 2015-04-29 ENCOUNTER — Ambulatory Visit: Payer: Medicare Other | Admitting: Podiatry

## 2015-04-29 DIAGNOSIS — R6889 Other general symptoms and signs: Secondary | ICD-10-CM

## 2015-04-29 DIAGNOSIS — R269 Unspecified abnormalities of gait and mobility: Secondary | ICD-10-CM

## 2015-04-29 DIAGNOSIS — R2681 Unsteadiness on feet: Secondary | ICD-10-CM

## 2015-04-29 DIAGNOSIS — Z4789 Encounter for other orthopedic aftercare: Secondary | ICD-10-CM

## 2015-04-29 DIAGNOSIS — M2560 Stiffness of unspecified joint, not elsewhere classified: Secondary | ICD-10-CM

## 2015-04-29 DIAGNOSIS — R531 Weakness: Secondary | ICD-10-CM

## 2015-04-29 DIAGNOSIS — Z7409 Other reduced mobility: Secondary | ICD-10-CM

## 2015-04-29 DIAGNOSIS — Z89511 Acquired absence of right leg below knee: Secondary | ICD-10-CM

## 2015-04-29 DIAGNOSIS — R2689 Other abnormalities of gait and mobility: Secondary | ICD-10-CM

## 2015-04-29 NOTE — Therapy (Signed)
[facility] [address] [place], [place], [address] Phone: [phone]   Fax:  [phone]  Physical Therapy Treatment  Patient Details  Name: Gene [name] MRN: [id number] Date of Birth: 1940-05-25 Referring Provider: Aldean Baker, MD  Encounter Date: 04/29/2015      PT End of Session - 04/29/15 0930    Visit Number 4   Number of Visits 17   Date for PT Re-Evaluation 06/17/15   Authorization Type Medicare G-code & progress note every 10th visit   PT Start Time 0846   PT Stop Time 0930   PT Time Calculation (min) 44 min   Equipment Utilized During Treatment Gait belt   Activity Tolerance Patient tolerated treatment well   Behavior During Therapy [facility] for tasks assessed/performed      Past Medical History  Diagnosis Date  . Anxiety and depression 1995    symptoms started aprox 1995, after a divorce  . Hyperlipidemia     high TG  . Hypertension   . Neuropathy (HCC)   . 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 History of alcohol abuse   . 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 Family history of anesthesia complication     "father would start seeing things"  . Anxiety   . Arthritis   . Ulcer of esophagus   . Heart murmur     as a baby " outgrew" " small one per Dr Drue Novel.  . PVD (peripheral vascular disease) (HCC)     PVD, mild saw Dr Allyson Sabal 2012, intolerant to pletal, Rx observation  . History of blood transfusion   . Anemia     Past Surgical History  Procedure Laterality Date  . Cholecystectomy    . Tonsillectomy    . Amputation Left 11/20/13 and 11/24/13    middle  . Amputation Left 01/03/2014    Procedure: Left Transmetatarsal Amputation;  Surgeon: Nadara Mustard, MD;  Location: [facility] OR;  Service: Orthopedics;  Laterality: Left;  . Transmetatarsal amputation Left 10/2013  . Colonoscopy    . Amputation Right 07/27/2014    Procedure: Right 4th Ray  Amputation;  Surgeon: Nadara Mustard, MD;  Location: [facility] OR;  Service: Orthopedics;  Laterality: Right;  . Esophagogastroduodenoscopy N/A 08/16/2014    Procedure: ESOPHAGOGASTRODUODENOSCOPY (EGD);  Surgeon: Vida Rigger, MD;  Location: [facility] ENDOSCOPY;  Service: Endoscopy;  Laterality: N/A;  . Esophagogastroduodenoscopy (egd) with propofol N/A 08/17/2014    Procedure: ESOPHAGOGASTRODUODENOSCOPY (EGD) WITH PROPOFOL;  Surgeon: Graylin Shiver, MD;  Location: [facility] ENDOSCOPY;  Service: Endoscopy;  Laterality: N/A;  . Toe amputation 4th r  07-27-2014  . Transmetatarsal amputation Right 07/06/02016  . Amputation Right 09/12/2014    Procedure: RIGHT TRANSMETATARSAL AMPUTATION;  Surgeon: Nadara Mustard, MD;  Location: [facility] OR;  Service: Orthopedics;  Laterality: Right;  . Amputation Right 09/28/2014    Procedure: Right Below Knee Amputation;  Surgeon: Nadara Mustard, MD;  Location: [facility] OR;  Service: Orthopedics;  Laterality: Right;    There were no vitals filed for this visit.  Visit Diagnosis:  Abnormality of gait  Unsteadiness  Balance problems  Weakness generalized  Stiffness due to immobility  Decreased functional activity tolerance  Encounter for prosthetic gait training  Status post below knee amputation of right lower extremity (HCC)      Subjective Assessment - 04/29/15 0851    Subjective [facility] and he  is working to switch from [place] to [place]. He is wearing prosthesis 2hrs 2x/day   Currently in Pain? No/denies                         [facility] Adult PT Treatment/Exercise - 04/29/15 0845    Transfers   Sit to Stand 5: Supervision;With upper extremity assist;With armrests;From chair/3-in-1  chairs without armrests using UEs   Sit to Stand Details Verbal cues for safe use of DME/AE;Verbal cues for sequencing   Stand to Sit 5: Supervision;With upper extremity assist;With armrests;To chair/3-in-1  chairs without armrests using UEs   Stand to Sit Details (indicate  cue type and reason) Verbal cues for safe use of DME/AE;Verbal cues for precautions/safety   Ambulation/Gait   Ambulation/Gait Yes   Ambulation/Gait Assistance 4: Min assist   Ambulation/Gait Assistance Details verbal & tactile cues on posture, wt shift & step length   Ambulation Distance (Feet) 100 Feet  100'    Assistive device Prosthesis;Rolling walker;Straight cane  arrived with RW   Gait Pattern Step-to pattern;Decreased step length - left;Decreased stance time - right;Decreased stride length;Decreased weight shift to right;Right hip hike;Right flexed knee in stance;Trunk flexed;Abducted- right   Ambulation Surface Indoor;Level   Stairs --   Stairs Assistance --   Stair Management Technique --   Number of Stairs --   Ramp --   Curb --   Prosthetics   Prosthetic Care Comments  increase wear to 3hrs 2x/day   Current prosthetic wear tolerance (days/week)  daily   Current prosthetic wear tolerance (#hours/day)  wears 2hrs 2x/day PT advised to increase 3hrs 2x/day   Residual limb condition  PT removed dry scabs.   Education Provided Residual limb care;Correct ply sock adjustment;Proper wear schedule/adjustment;Proper weight-bearing schedule/adjustment;Skin check   Person(s) Educated Patient   Education Method Explanation;Demonstration;Tactile cues;Verbal cues   Education Method Verbalized understanding;Returned demonstration;Tactile cues required;Verbal cues required;Needs further instruction             Balance Exercises - 04/29/15 0845    Balance Exercises: Standing   Rockerboard Anterior/posterior;Lateral;Head turns;EO;Other (comment)  weight shifts & stabilization /recovery   Step Ups 4 inch;UE support 2;UE support 1;Forward;Lateral   Balance Beam standing crossways with red theraband reciprocal & BUE row, forward & upward reaching and alternate forward stepping             PT Short Term Goals - 04/29/15 0930    PT SHORT TERM GOAL #1   Title Patient tolerates  wear >8 hours total /day without negative changes to current wounds & no new wounds. (Target Date: 05/17/2015)   Time 4   Period Weeks   Status On-going   PT SHORT TERM GOAL #2   Title Patient verbalizes proper cleaning of prosthesis and demonstrates proper donning modified independent. (Target Date: 05/17/2015)   Time 4   Period Months   Status On-going   PT SHORT TERM GOAL #3   Title Patient reaches 5" anteriorly, laterally, across midline & to floor without UE support with supervision. (Target Date: 05/17/2015)   Time 4   Period Weeks   Status On-going   PT SHORT TERM GOAL #4   Title Patient ambulates 300' & negotiates ramps /curbs with RW / prosthesis modified independent. (Target Date: 05/17/2015)   Time 4   Period Weeks   Status On-going   PT SHORT TERM GOAL #5   Title Patient ambulates 100' with single point cane & prosthesis with minimal assist. (Target Date:  05/17/2015)   Time 4   Period Weeks   Status On-going           PT Long Term Goals - 04/29/15 0930    PT LONG TERM GOAL #1   Title Patient is independent in prosthetic care to enable safe use of prosthesis. (Target Date: 06/14/2015)   Time 8   Period Weeks   Status On-going   PT LONG TERM GOAL #2   Title Patient tolerates wear of prosthesis >90% of awake hours without skin intergrity issues or limb tenderness to enable function most of awake hours. (Target Date: 06/14/2015)   Time 8   Period Weeks   Status On-going   PT LONG TERM GOAL #3   Title Berg Balance >45/56 to indicate lower fall risk. (Target Date: 06/14/2015)   Time 8   Period Weeks   Status On-going   PT LONG TERM GOAL #4   Title Patient ambulates 100' carrying laundry basket around furniture with prosthesis only modified indepenent. (Target Date: 06/14/2015)   Time 8   Period Weeks   Status On-going   PT LONG TERM GOAL #5   Title Patient ambulates 500' including grass, ramps, curbs & stairs (1 rail) with single point cane & prosthesis modified  independent. (Target Date: 06/14/2015)   Time 8   Period Weeks   Status On-going               Plan - 04/29/15 0930    Clinical Impression Statement Patient improved  balance reactions with skilled instruction including tactile cues and repetition. Patient improved weight shift over prosthesis with prosthetic gait with a cane.    Pt will benefit from skilled therapeutic intervention in order to improve on the following deficits Abnormal gait;Decreased activity tolerance;Decreased balance;Decreased endurance;Decreased knowledge of use of DME;Decreased mobility;Decreased range of motion;Decreased strength;Postural dysfunction;Prosthetic Dependency   Rehab Potential Good   PT Frequency 2x / week   PT Duration 8 weeks   PT Treatment/Interventions ADLs/Self Care Home Management;DME Instruction;Gait training;Stair training;Functional mobility training;Therapeutic activities;Therapeutic exercise;Balance training;Neuromuscular re-education;Patient/family education;Prosthetic Training;Vestibular   PT Next Visit Plan continue balance training & gait with cane including barriers.    Consulted and Agree with Plan of Care Patient        Problem List Patient Active Problem List   Diagnosis Date Noted  . Status post amputation 01/28/2015  . Neuropathy (HCC) 01/28/2015  . PCP NOTES >>>> 01/08/2015  . Below knee amputation status (HCC) 09/28/2014  . Osteomyelitis (HCC) 08/29/2014  . 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 Generalized weakness   . GI bleed 08/15/2014  . Acute blood loss anemia 08/15/2014  . Acute GI bleeding 08/15/2014  . Gastrointestinal hemorrhage with melena   . Prediabetes 07/20/2014  . Heart murmur 05/11/2014  . S/P transmetatarsal amputation of foot (HCC) 01/03/2014  . PVD (peripheral vascular disease) (HCC) 01/20/2013  . Lesion of ulnar nerve 07/22/2012  . Edema 10/09/2011  . Hyperglycemia 10/09/2011  . Hemorrhoid 10/09/2011  . DJD (degenerative joint  disease) 07/10/2011  . Peripheral neuropathy (HCC) 04/17/2011  . Alcohol abuse 01/22/2011  . Annual physical exam 09/26/2010  . FATIGUE 09/14/2008  . ERECTILE DYSFUNCTION 01/26/2008  . Hyperlipidemia 06/11/2006  . Anxiety and depression 06/11/2006  . HTN (hypertension) 06/11/2006  . s/t transmetatarsal amputation due to osteomyelitis 06/11/2006  . Insomnia 06/11/2006    Demonte Dobratz PT, DPT 04/29/2015, 7:05 PM  [facility] [address] [place], [place], [address] Phone: [phone]  Fax:  [phone]  Name: Gene [name] MRN: [id number] Date of Birth: 1940-03-11

## 2015-05-03 ENCOUNTER — Encounter: Payer: Self-pay | Admitting: Physical Therapy

## 2015-05-03 ENCOUNTER — Ambulatory Visit: Payer: Medicare Other | Admitting: Physical Therapy

## 2015-05-03 DIAGNOSIS — R531 Weakness: Secondary | ICD-10-CM

## 2015-05-03 DIAGNOSIS — R269 Unspecified abnormalities of gait and mobility: Secondary | ICD-10-CM

## 2015-05-03 DIAGNOSIS — R6889 Other general symptoms and signs: Secondary | ICD-10-CM

## 2015-05-03 DIAGNOSIS — R2681 Unsteadiness on feet: Secondary | ICD-10-CM

## 2015-05-03 DIAGNOSIS — Z4789 Encounter for other orthopedic aftercare: Secondary | ICD-10-CM

## 2015-05-03 DIAGNOSIS — Z7409 Other reduced mobility: Secondary | ICD-10-CM

## 2015-05-03 DIAGNOSIS — M2560 Stiffness of unspecified joint, not elsewhere classified: Secondary | ICD-10-CM

## 2015-05-03 DIAGNOSIS — R2689 Other abnormalities of gait and mobility: Secondary | ICD-10-CM

## 2015-05-05 NOTE — Therapy (Signed)
[facility] [address] [place], [place], [address] Phone: [phone]   Fax:  [phone]  Physical Therapy Treatment  Patient Details  Name: Gene [name] MRN: [id number] Date of Birth: 05-07-1940 Referring Provider: Aldean Baker, MD  Encounter Date: 05/03/2015   05/03/15 0852  PT Visits / Re-Eval  Visit Number 5  Number of Visits 17  Date for PT Re-Evaluation 06/17/15  Authorization  Authorization Type Medicare G-code & progress note every 10th visit  PT Time Calculation  PT Start Time 0848  PT Stop Time 0930  PT Time Calculation (min) 42 min  PT - End of Session  Equipment Utilized During Treatment Gait belt  Activity Tolerance Patient tolerated treatment well  Behavior During Therapy [facility] for tasks assessed/performed     Past Medical History  Diagnosis Date  . Anxiety and depression 1995    symptoms started aprox 1995, after a divorce  . Hyperlipidemia     high TG  . Hypertension   . Neuropathy (HCC)   . 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 History of alcohol abuse   . 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 Family history of anesthesia complication     "father would start seeing things"  . Anxiety   . Arthritis   . Ulcer of esophagus   . Heart murmur     as a baby " outgrew" " small one per Dr Drue Novel.  . PVD (peripheral vascular disease) (HCC)     PVD, mild saw Dr Allyson Sabal 2012, intolerant to pletal, Rx observation  . History of blood transfusion   . Anemia     Past Surgical History  Procedure Laterality Date  . Cholecystectomy    . Tonsillectomy    . Amputation Left 11/20/13 and 11/24/13    middle  . Amputation Left 01/03/2014    Procedure: Left Transmetatarsal Amputation;  Surgeon: Nadara Mustard, MD;  Location: [facility] OR;  Service: Orthopedics;  Laterality: Left;  . Transmetatarsal amputation Left 10/2013  . Colonoscopy    . Amputation Right  07/27/2014    Procedure: Right 4th Ray Amputation;  Surgeon: Nadara Mustard, MD;  Location: [facility] OR;  Service: Orthopedics;  Laterality: Right;  . Esophagogastroduodenoscopy N/A 08/16/2014    Procedure: ESOPHAGOGASTRODUODENOSCOPY (EGD);  Surgeon: Vida Rigger, MD;  Location: [facility] ENDOSCOPY;  Service: Endoscopy;  Laterality: N/A;  . Esophagogastroduodenoscopy (egd) with propofol N/A 08/17/2014    Procedure: ESOPHAGOGASTRODUODENOSCOPY (EGD) WITH PROPOFOL;  Surgeon: Graylin Shiver, MD;  Location: [facility] ENDOSCOPY;  Service: Endoscopy;  Laterality: N/A;  . Toe amputation 4th r  07-27-2014  . Transmetatarsal amputation Right 07/06/02016  . Amputation Right 09/12/2014    Procedure: RIGHT TRANSMETATARSAL AMPUTATION;  Surgeon: Nadara Mustard, MD;  Location: [facility] OR;  Service: Orthopedics;  Laterality: Right;  . Amputation Right 09/28/2014    Procedure: Right Below Knee Amputation;  Surgeon: Nadara Mustard, MD;  Location: [facility] OR;  Service: Orthopedics;  Laterality: Right;    There were no vitals filed for this visit.  Visit Diagnosis:  Abnormality of gait  Unsteadiness  Balance problems  Weakness generalized  Stiffness due to immobility  Decreased functional activity tolerance  Encounter for prosthetic gait training     05/03/15 0850  Symptoms/Limitations  Subjective Had one fall in the house this past wednesday. Was using walker and not paying attention and fell backwards. Landed on buttocks and back. Had  some neck pain afterwards, none now. Was able to get him sef up from floor. Continues to use walker to enter/exit home and community for short distances without any issues.                                       Pertinent History HTN, Neuropathy, hx of alcohol abuse, Ulnar nerve lesions bil. , arthritis, PVD, Left foot Trans Metatarsal amputation 01/03/2014.   Patient Stated Goals He wants to use prosthesis to get around home & community.   Pain Assessment  Currently in Pain? No/denies       05/03/15 0853   Transfers  Sit to Stand 5: Supervision;With upper extremity assist;With armrests;From chair/3-in-1  Sit to Stand Details Verbal cues for safe use of DME/AE;Verbal cues for precautions/safety  Stand to Sit 5: Supervision;With upper extremity assist;With armrests;To chair/3-in-1  Stand to Sit Details (indicate cue type and reason) Verbal cues for precautions/safety;Verbal cues for safe use of DME/AE  Ambulation/Gait  Ambulation/Gait Yes  Ambulation/Gait Assistance 4: Min assist;4: Min guard  Ambulation/Gait Assistance Details cues on posture and sequencing. increased assist needed with turns, obstacle negotiation and when pt is fatigued. cues to correct listed gait deviations as well.                      Ambulation Distance (Feet) 460 Feet (x1)  Assistive device Prosthesis;Straight cane  Gait Pattern Step-through pattern;Step-to pattern;Decreased stride length;Decreased step length - left;Decreased stance time - right;Abducted- right;Narrow base of support  Ambulation Surface Level;Indoor  Ramp 4: Min assist (cane and prosthesis x 3 reps)  Ramp Details (indicate cue type and reason) cues on sequencing and posture  Curb 4: Min assist (cane and prosthesis)  Curb Details (indicate cue type and reason) cues on posture, cane placement, stance position and technique  Prosthetics  Prosthetic Care Comments  reinforced importance of consistent wear times and gradual build up of wear time and weight bearing on prosthesis.  Current prosthetic wear tolerance (days/week)  daily  Current prosthetic wear tolerance (#hours/day)  trying to build up to 3 hours 2x day, limited by driving and multiple appointments as he can't wear it in the car due to hand controls that are in the way. wears it all the time he is at home except for bed and showers.  Residual limb condition  PTA removed dry scabs for bil lateral edges of incision. smooth skin with no open areas underneath.  Education Provided Residual limb  care;Correct ply sock adjustment;Proper Donning;Proper Doffing;Proper wear schedule/adjustment;Proper weight-bearing schedule/adjustment  Person(s) Educated Patient  Education Method Explanation;Verbal cues;Demonstration  Education Method Verbalized understanding;Verbal cues required  Donning Prosthesis 5  Doffing Prosthesis 5           PT Short Term Goals - 04/29/15 0930    PT SHORT TERM GOAL #1   Title Patient tolerates wear >8 hours total /day without negative changes to current wounds & no new wounds. (Target Date: 05/17/2015)   Time 4   Period Weeks   Status On-going   PT SHORT TERM GOAL #2   Title Patient verbalizes proper cleaning of prosthesis and demonstrates proper donning modified independent. (Target Date: 05/17/2015)   Time 4   Period Months   Status On-going   PT SHORT TERM GOAL #3   Title Patient reaches 5" anteriorly, laterally, across midline & to floor without UE support with supervision. (Target  Date: 05/17/2015)   Time 4   Period Weeks   Status On-going   PT SHORT TERM GOAL #4   Title Patient ambulates 300' & negotiates ramps /curbs with RW / prosthesis modified independent. (Target Date: 05/17/2015)   Time 4   Period Weeks   Status On-going   PT SHORT TERM GOAL #5   Title Patient ambulates 100' with single point cane & prosthesis with minimal assist. (Target Date: 05/17/2015)   Time 4   Period Weeks   Status On-going           PT Long Term Goals - 04/29/15 0930    PT LONG TERM GOAL #1   Title Patient is independent in prosthetic care to enable safe use of prosthesis. (Target Date: 06/14/2015)   Time 8   Period Weeks   Status On-going   PT LONG TERM GOAL #2   Title Patient tolerates wear of prosthesis >90% of awake hours without skin intergrity issues or limb tenderness to enable function most of awake hours. (Target Date: 06/14/2015)   Time 8   Period Weeks   Status On-going   PT LONG TERM GOAL #3   Title Berg Balance >45/56 to indicate lower fall  risk. (Target Date: 06/14/2015)   Time 8   Period Weeks   Status On-going   PT LONG TERM GOAL #4   Title Patient ambulates 100' carrying laundry basket around furniture with prosthesis only modified indepenent. (Target Date: 06/14/2015)   Time 8   Period Weeks   Status On-going   PT LONG TERM GOAL #5   Title Patient ambulates 500' including grass, ramps, curbs & stairs (1 rail) with single point cane & prosthesis modified independent. (Target Date: 06/14/2015)   Time 8   Period Weeks   Status On-going        05/03/15 1610  Plan  Clinical Impression Statement Today's skilled session continued to focus on gait with cane and prosthesis. Pt is making steady progress toward goals with increased gait distance and less overall assistance needed today.  Pt will benefit from skilled therapeutic intervention in order to improve on the following deficits Abnormal gait;Decreased activity tolerance;Decreased balance;Decreased endurance;Decreased knowledge of use of DME;Decreased mobility;Decreased range of motion;Decreased strength;Postural dysfunction;Prosthetic Dependency  Rehab Potential Good  PT Frequency 2x / week  PT Duration 8 weeks  PT Treatment/Interventions ADLs/Self Care Home Management;DME Instruction;Gait training;Stair training;Functional mobility training;Therapeutic activities;Therapeutic exercise;Balance training;Neuromuscular re-education;Patient/family education;Prosthetic Training;Vestibular  PT Next Visit Plan continue balance training & gait with cane including barriers.   Consulted and Agree with Plan of Care Patient     Problem List Patient Active Problem List   Diagnosis Date Noted  . Status post amputation 01/28/2015  . Neuropathy (HCC) 01/28/2015  . PCP NOTES >>>> 01/08/2015  . Below knee amputation status (HCC) 09/28/2014  . Osteomyelitis (HCC) 08/29/2014  . 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 Generalized weakness   . GI bleed 08/15/2014  . Acute blood loss  anemia 08/15/2014  . Acute GI bleeding 08/15/2014  . Gastrointestinal hemorrhage with melena   . Prediabetes 07/20/2014  . Heart murmur 05/11/2014  . S/P transmetatarsal amputation of foot (HCC) 01/03/2014  . PVD (peripheral vascular disease) (HCC) 01/20/2013  . Lesion of ulnar nerve 07/22/2012  . Edema 10/09/2011  . Hyperglycemia 10/09/2011  . Hemorrhoid 10/09/2011  . DJD (degenerative joint disease) 07/10/2011  . Peripheral neuropathy (HCC) 04/17/2011  . Alcohol abuse 01/22/2011  . Annual physical exam 09/26/2010  . FATIGUE 09/14/2008  .  ERECTILE DYSFUNCTION 01/26/2008  . Hyperlipidemia 06/11/2006  . Anxiety and depression 06/11/2006  . HTN (hypertension) 06/11/2006  . s/t transmetatarsal amputation due to osteomyelitis 06/11/2006  . Insomnia 06/11/2006    Sallyanne Kuster 05/05/2015, 10:23 PM  Sallyanne Kuster, PTA, [facility] [address], [address] [place], [place] [address] [phone] 05/05/2015, 10:34 PM   Name: Gene [name] MRN: [id number] Date of Birth: [birth year]

## 2015-05-06 ENCOUNTER — Ambulatory Visit: Payer: Medicare Other | Admitting: Physical Therapy

## 2015-05-06 ENCOUNTER — Encounter: Payer: Self-pay | Admitting: Physical Therapy

## 2015-05-06 DIAGNOSIS — R269 Unspecified abnormalities of gait and mobility: Secondary | ICD-10-CM | POA: Diagnosis not present

## 2015-05-06 DIAGNOSIS — Z89432 Acquired absence of left foot: Secondary | ICD-10-CM

## 2015-05-06 DIAGNOSIS — Z89511 Acquired absence of right leg below knee: Secondary | ICD-10-CM

## 2015-05-06 DIAGNOSIS — R531 Weakness: Secondary | ICD-10-CM

## 2015-05-06 DIAGNOSIS — M2560 Stiffness of unspecified joint, not elsewhere classified: Secondary | ICD-10-CM

## 2015-05-06 DIAGNOSIS — R6889 Other general symptoms and signs: Secondary | ICD-10-CM

## 2015-05-06 DIAGNOSIS — Z7409 Other reduced mobility: Secondary | ICD-10-CM

## 2015-05-06 DIAGNOSIS — R2689 Other abnormalities of gait and mobility: Secondary | ICD-10-CM

## 2015-05-06 DIAGNOSIS — Z4789 Encounter for other orthopedic aftercare: Secondary | ICD-10-CM

## 2015-05-06 DIAGNOSIS — R2681 Unsteadiness on feet: Secondary | ICD-10-CM

## 2015-05-06 NOTE — Therapy (Signed)
[facility] [address] [place], [place], [address] Phone: [phone]   Fax:  [phone]  Physical Therapy Treatment  Patient Details  Name: Gene [name] MRN: [id number] Date of Birth: Jan 09, 1941 Referring Provider: Aldean [place], MD  Encounter Date: 05/06/2015      PT End of Session - 05/06/15 0930    Visit Number 6   Number of Visits 17   Date for PT Re-Evaluation 06/17/15   Authorization Type Medicare G-code & progress note every 10th visit   PT Start Time 0845   PT Stop Time 0930   PT Time Calculation (min) 45 min   Equipment Utilized During Treatment Gait belt   Activity Tolerance Patient tolerated treatment well   Behavior During Therapy [facility] for tasks assessed/performed      Past Medical History  Diagnosis Date  . Anxiety and depression 1995    symptoms started aprox 1995, after a divorce  . Hyperlipidemia     high TG  . Hypertension   . Neuropathy (HCC)   . 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 History of alcohol abuse   . 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 Family history of anesthesia complication     "father would start seeing things"  . Anxiety   . Arthritis   . Ulcer of esophagus   . Heart murmur     as a baby " outgrew" " small one per Dr Drue Novel.  . PVD (peripheral vascular disease) (HCC)     PVD, mild saw Dr Allyson Sabal 2012, intolerant to pletal, Rx observation  . History of blood transfusion   . Anemia     Past Surgical History  Procedure Laterality Date  . Cholecystectomy    . Tonsillectomy    . Amputation Left 11/20/13 and 11/24/13    middle  . Amputation Left 01/03/2014    Procedure: Left Transmetatarsal Amputation;  Surgeon: Nadara Mustard, MD;  Location: [facility] OR;  Service: Orthopedics;  Laterality: Left;  . Transmetatarsal amputation Left 10/2013  . Colonoscopy    . Amputation Right 07/27/2014    Procedure: Right 4th Ray  Amputation;  Surgeon: Nadara Mustard, MD;  Location: [facility] OR;  Service: Orthopedics;  Laterality: Right;  . Esophagogastroduodenoscopy N/A 08/16/2014    Procedure: ESOPHAGOGASTRODUODENOSCOPY (EGD);  Surgeon: Vida Rigger, MD;  Location: [facility] ENDOSCOPY;  Service: Endoscopy;  Laterality: N/A;  . Esophagogastroduodenoscopy (egd) with propofol N/A 08/17/2014    Procedure: ESOPHAGOGASTRODUODENOSCOPY (EGD) WITH PROPOFOL;  Surgeon: Graylin Shiver, MD;  Location[facility] ENDOSCOPY;  Service: Endoscopy;  Laterality: N/A;  . Toe amputation 4th r  07-27-2014  . Transmetatarsal amputation Right 07/06/02016  . Amputation Right 09/12/2014    Procedure: RIGHT TRANSMETATARSAL AMPUTATION;  Surgeon: Nadara Mustard, MD;  Location: [facility] OR;  Service: Orthopedics;  Laterality: Right;  . Amputation Right 09/28/2014    Procedure: Right Below Knee Amputation;  Surgeon: Nadara Mustard, MD;  Location[facility] OR;  Service: Orthopedics;  Laterality: Right;    There were no vitals filed for this visit.  Visit Diagnosis:  Abnormality of gait  Unsteadiness  Balance problems  Weakness generalized  Stiffness due to immobility  Decreased functional activity tolerance  Status post below knee amputation of right lower extremity (HCC)  Encounter for prosthetic gait training  S/P transmetatarsal amputation of foot, left (HCC)      Subjective Assessment - 05/06/15 1347  Subjective Reports wearing prosthesis 5-6 hours once per day.    Pertinent History HTN, Neuropathy, hx of alcohol abuse, Ulnar nerve lesions bil. , arthritis, PVD, Left foot Trans Metatarsal amputation 01/03/2014.    Patient Stated Goals He wants to use prosthesis to get around home & community.    Currently in Pain? No/denies                         [facility] Adult PT Treatment/Exercise - 05/06/15 0845    Transfers   Sit to Stand 5: Supervision;With upper extremity assist;With armrests;From chair/3-in-1   Sit to Stand Details Verbal cues for safe use of  DME/AE;Verbal cues for precautions/safety   Stand to Sit 5: Supervision;With upper extremity assist;With armrests;To chair/3-in-1   Stand to Sit Details (indicate cue type and reason) Verbal cues for precautions/safety;Verbal cues for safe use of DME/AE   Ambulation/Gait   Ambulation/Gait Yes   Ambulation/Gait Assistance 4: Min assist;4: Min guard;5: Supervision  SBA rollator, MinA to Min guard [facility]   Ambulation/Gait Assistance Details verbal & tactile cues on posture & step length; PT instructed in use of rollator as option with seat for rest periods.    Ambulation Distance (Feet) 300 Feet  200' with rollator walker; 300' X 2 with cane   Assistive device Prosthesis;Straight cane;Rollator   Gait Pattern Step-through pattern;Step-to pattern;Decreased stride length;Decreased step length - left;Decreased stance time - right;Abducted- right;Narrow base of support   Ambulation Surface Indoor;Level   Stairs Yes   Stairs Assistance 5: Supervision   Stairs Assistance Details (indicate cue type and reason) cues on sequence, weight shift & cane location   Stair Management Technique One rail Right;One rail Left;Step to pattern;Forwards;With cane  varied rail location; used single rail & cane   Number of Stairs 4  3 reps   Ramp 4: Min assist;5: Supervision  cane and prosthesis x 3 reps; SBA rollator   Ramp Details (indicate cue type and reason) cues on how to use rollator on ramps; with cane tactile, manual & verbal cues on technique   Curb 4: Min assist;5: Supervision  cane and prosthesis; rollator SBA   Curb Details (indicate cue type and reason) PT instructed in use of rollator; PT verbal, manual & tactile cues on technique with cane   Prosthetics   Prosthetic Care Comments  instructed in use of proximal to knee under liner cut-off sock to aid sweat wicking; proper location of socks; start & end wear once upstairs (he usually only goes up at end of day & comes downstairs after bath, ready for day.  PT recommended donne upstairs immediately after bath, wear 5 hrs; then remove liner & prosthesis for 2-3 hours. Redonne for 2nd wear of 5 hours with goal to end at end of his day upstairs. Recommended to always have prosthesis in car (wear liners & socks while driving) and connect /disconnect prosthesis once seated in car as he reports unable to drive with prosthesis on limb.    Current prosthetic wear tolerance (days/week)  daily   Current prosthetic wear tolerance (#hours/day)  increase to 5 hours 2x/day   Residual limb condition  dry scabs with no open areas.    Education Provided Residual limb care;Correct ply sock adjustment;Proper Donning;Proper Doffing;Proper wear schedule/adjustment;Proper weight-bearing schedule/adjustment;Other (comment)  see prosthetic care   Person(s) Educated Patient   Education Method Explanation;Demonstration;Tactile cues;Verbal cues   Education Method Verbalized understanding;Returned demonstration;Tactile cues required;Verbal cues required;Needs further instruction   Donning Prosthesis Supervision  Doffing Prosthesis Supervision                  PT Short Term Goals - 04/29/15 0930    PT SHORT TERM GOAL #1   Title Patient tolerates wear >8 hours total /day without negative changes to current wounds & no new wounds. (Target Date: 05/17/2015)   Time 4   Period Weeks   Status On-going   PT SHORT TERM GOAL #2   Title Patient verbalizes proper cleaning of prosthesis and demonstrates proper donning modified independent. (Target Date: 05/17/2015)   Time 4   Period Months   Status On-going   PT SHORT TERM GOAL #3   Title Patient reaches 5" anteriorly, laterally, across midline & to floor without UE support with supervision. (Target Date: 05/17/2015)   Time 4   Period Weeks   Status On-going   PT SHORT TERM GOAL #4   Title Patient ambulates 300' & negotiates ramps /curbs with RW / prosthesis modified independent. (Target Date: 05/17/2015)   Time 4    Period Weeks   Status On-going   PT SHORT TERM GOAL #5   Title Patient ambulates 100' with single point cane & prosthesis with minimal assist. (Target Date: 05/17/2015)   Time 4   Period Weeks   Status On-going           PT Long Term Goals - 04/29/15 0930    PT LONG TERM GOAL #1   Title Patient is independent in prosthetic care to enable safe use of prosthesis. (Target Date: 06/14/2015)   Time 8   Period Weeks   Status On-going   PT LONG TERM GOAL #2   Title Patient tolerates wear of prosthesis >90% of awake hours without skin intergrity issues or limb tenderness to enable function most of awake hours. (Target Date: 06/14/2015)   Time 8   Period Weeks   Status On-going   PT LONG TERM GOAL #3   Title Berg Balance >45/56 to indicate lower fall risk. (Target Date: 06/14/2015)   Time 8   Period Weeks   Status On-going   PT LONG TERM GOAL #4   Title Patient ambulates 100' carrying laundry basket around furniture with prosthesis only modified indepenent. (Target Date: 06/14/2015)   Time 8   Period Weeks   Status On-going   PT LONG TERM GOAL #5   Title Patient ambulates 500' including grass, ramps, curbs & stairs (1 rail) with single point cane & prosthesis modified independent. (Target Date: 06/14/2015)   Time 8   Period Weeks   Status On-going               Plan - 05/06/15 0930    Clinical Impression Statement Patient appears ready to increase wear time. He appears to understand PT recommendation to initiate & end wear once upstairs where his bedroom is located. Patient appears safe with rollator walker if he needs to rest as he wants to start going more places.    Pt will benefit from skilled therapeutic intervention in order to improve on the following deficits Abnormal gait;Decreased activity tolerance;Decreased balance;Decreased endurance;Decreased knowledge of use of DME;Decreased mobility;Decreased range of motion;Decreased strength;Postural dysfunction;Prosthetic Dependency    Rehab Potential Good   PT Frequency 2x / week   PT Duration 8 weeks   PT Treatment/Interventions ADLs/Self Care Home Management;DME Instruction;Gait training;Stair training;Functional mobility training;Therapeutic activities;Therapeutic exercise;Balance training;Neuromuscular re-education;Patient/family education;Prosthetic Training;Vestibular   PT Next Visit Plan continue balance training & gait with cane including barriers.  Consulted and Agree with Plan of Care Patient        Problem List Patient Active Problem List   Diagnosis Date Noted  . Status post amputation 01/28/2015  . Neuropathy (HCC) 01/28/2015  . PCP NOTES >>>> 01/08/2015  . Below knee amputation status (HCC) 09/28/2014  . Osteomyelitis (HCC) 08/29/2014  . 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 Generalized weakness   . GI bleed 08/15/2014  . Acute blood loss anemia 08/15/2014  . Acute GI bleeding 08/15/2014  . Gastrointestinal hemorrhage with melena   . Prediabetes 07/20/2014  . Heart murmur 05/11/2014  . S/P transmetatarsal amputation of foot (HCC) 01/03/2014  . PVD (peripheral vascular disease) (HCC) 01/20/2013  . Lesion of ulnar nerve 07/22/2012  . Edema 10/09/2011  . Hyperglycemia 10/09/2011  . Hemorrhoid 10/09/2011  . DJD (degenerative joint disease) 07/10/2011  . Peripheral neuropathy (HCC) 04/17/2011  . Alcohol abuse 01/22/2011  . Annual physical exam 09/26/2010  . FATIGUE 09/14/2008  . ERECTILE DYSFUNCTION 01/26/2008  . Hyperlipidemia 06/11/2006  . Anxiety and depression 06/11/2006  . HTN (hypertension) 06/11/2006  . s/t transmetatarsal amputation due to osteomyelitis 06/11/2006  . Insomnia 06/11/2006    Vladimir Faster PT, DPT 05/06/2015, 2:08 PM  [facility] [address] [place], [place], [address] Phone: [phone]   Fax:  [phone]  Name: DACOTAH CABELLO MRN: [id number] Date of Birth: Sep 13, 1940

## 2015-05-08 ENCOUNTER — Telehealth: Payer: Self-pay | Admitting: Internal Medicine

## 2015-05-08 HISTORY — PX: CATARACT EXTRACTION W/ INTRAOCULAR LENS  IMPLANT, BILATERAL: SHX1307

## 2015-05-08 MED ORDER — ALPRAZOLAM 0.5 MG PO TABS: 0.5000 mg | ORAL_TABLET | Freq: Three times a day (TID) | ORAL | Status: DC

## 2015-05-08 NOTE — Telephone Encounter (Signed)
Rx printed, awaiting MD signature.  

## 2015-05-08 NOTE — Telephone Encounter (Signed)
Caller name: Self  Can be reached:  [phone]    Reason for call: Request refill on ALPRAZolam Prudy Feeler) 0.5 MG tablet [[id number]] and also request a call back about another issue he is having

## 2015-05-08 NOTE — Telephone Encounter (Signed)
Rx faxed to [facility]. LMOM informing Pt to return call.

## 2015-05-08 NOTE — Telephone Encounter (Signed)
Okay #90 and 1 refill 

## 2015-05-08 NOTE — Telephone Encounter (Signed)
Pt is requesting refill on Alprazolam.  Last OV: 02/08/2015 Last Fill: 03/05/2015 #90 and 1RF Pt sig: 1 tablet TID PRN UDS: None  Please advise.

## 2015-05-09 ENCOUNTER — Encounter: Payer: Self-pay | Admitting: Physical Therapy

## 2015-05-09 ENCOUNTER — Ambulatory Visit: Payer: Medicare Other | Attending: Orthopedic Surgery | Admitting: Physical Therapy

## 2015-05-09 DIAGNOSIS — R531 Weakness: Secondary | ICD-10-CM | POA: Diagnosis present

## 2015-05-09 DIAGNOSIS — R29818 Other symptoms and signs involving the nervous system: Secondary | ICD-10-CM | POA: Insufficient documentation

## 2015-05-09 DIAGNOSIS — Z89511 Acquired absence of right leg below knee: Secondary | ICD-10-CM | POA: Insufficient documentation

## 2015-05-09 DIAGNOSIS — R2681 Unsteadiness on feet: Secondary | ICD-10-CM

## 2015-05-09 DIAGNOSIS — R6889 Other general symptoms and signs: Secondary | ICD-10-CM | POA: Insufficient documentation

## 2015-05-09 DIAGNOSIS — M623 Immobility syndrome (paraplegic): Secondary | ICD-10-CM | POA: Diagnosis present

## 2015-05-09 DIAGNOSIS — Z5189 Encounter for other specified aftercare: Secondary | ICD-10-CM | POA: Diagnosis present

## 2015-05-09 DIAGNOSIS — R2689 Other abnormalities of gait and mobility: Secondary | ICD-10-CM

## 2015-05-09 DIAGNOSIS — R269 Unspecified abnormalities of gait and mobility: Secondary | ICD-10-CM | POA: Insufficient documentation

## 2015-05-09 DIAGNOSIS — Z89432 Acquired absence of left foot: Secondary | ICD-10-CM | POA: Insufficient documentation

## 2015-05-09 DIAGNOSIS — Z4789 Encounter for other orthopedic aftercare: Secondary | ICD-10-CM

## 2015-05-09 NOTE — Therapy (Signed)
[facility] [address] [place], [place], [address] Phone: [phone]   Fax:  [phone]  Physical Therapy Treatment  Patient Details  Name: Gene [name] MRN: [id number] Date of Birth: [birth year] Referring Provider: Aldean [place], MD  Encounter Date: 05/09/2015      PT End of Session - 05/09/15 0938    Visit Number 7   Number of Visits 17   Date for PT Re-Evaluation 06/17/15   Authorization Type Medicare G-code & progress note every 10th visit   PT Start Time 0845   PT Stop Time 0930   PT Time Calculation (min) 45 min   Equipment Utilized During Treatment Gait belt   Activity Tolerance Patient tolerated treatment well   Behavior During Therapy [facility] for tasks assessed/performed      Past Medical History  Diagnosis Date  . Anxiety and depression 1995    symptoms started aprox 1995, after a divorce  . Hyperlipidemia     high TG  . Hypertension   . Neuropathy (HCC)   . 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 History of alcohol abuse   . 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 Family history of anesthesia complication     "father would start seeing things"  . Anxiety   . Arthritis   . Ulcer of esophagus   . Heart murmur     as a baby " outgrew" " small one per Dr Drue Novel.  . PVD (peripheral vascular disease) (HCC)     PVD, mild saw Dr Allyson Sabal 2012, intolerant to pletal, Rx observation  . History of blood transfusion   . Anemia     Past Surgical History  Procedure Laterality Date  . Cholecystectomy    . Tonsillectomy    . Amputation Left 11/20/13 and 11/24/13    middle  . Amputation Left 01/03/2014    Procedure: Left Transmetatarsal Amputation;  Surgeon: Nadara Mustard, MD;  Location: [facility] OR;  Service: Orthopedics;  Laterality: Left;  . Transmetatarsal amputation Left 10/2013  . Colonoscopy    . Amputation Right 07/27/2014    Procedure: Right 4th Ray  Amputation;  Surgeon: Nadara Mustard, MD;  Location: [facility] OR;  Service: Orthopedics;  Laterality: Right;  . Esophagogastroduodenoscopy N/A 08/16/2014    Procedure: ESOPHAGOGASTRODUODENOSCOPY (EGD);  Surgeon: Vida Rigger, MD;  Location: [facility] ENDOSCOPY;  Service: Endoscopy;  Laterality: N/A;  . Esophagogastroduodenoscopy (egd) with propofol N/A 08/17/2014    Procedure: ESOPHAGOGASTRODUODENOSCOPY (EGD) WITH PROPOFOL;  Surgeon: Graylin Shiver, MD;  Location: [facility] ENDOSCOPY;  Service: Endoscopy;  Laterality: N/A;  . Toe amputation 4th r  07-27-2014  . Transmetatarsal amputation Right 07/06/02016  . Amputation Right 09/12/2014    Procedure: RIGHT TRANSMETATARSAL AMPUTATION;  Surgeon: Nadara Mustard, MD;  Location: [facility] OR;  Service: Orthopedics;  Laterality: Right;  . Amputation Right 09/28/2014    Procedure: Right Below Knee Amputation;  Surgeon: Nadara Mustard, MD;  Location: [facility] OR;  Service: Orthopedics;  Laterality: Right;    There were no vitals filed for this visit.  Visit Diagnosis:  Abnormality of gait  Unsteadiness  Balance problems  Encounter for prosthetic gait training      Subjective Assessment - 05/09/15 0848    Subjective Reports wearing prosthesis 6 hours twice per day.    Pertinent History HTN, Neuropathy, hx of alcohol abuse, Ulnar nerve lesions bil. , arthritis, PVD, Left foot Trans Metatarsal amputation  01/03/2014.    Patient Stated Goals He wants to use prosthesis to get around home & community.    Currently in Pain? No/denies          [facility] Adult PT Treatment/Exercise - 05/09/15 0901    Ambulation/Gait   Ambulation/Gait Yes   Ambulation/Gait Assistance 5: Supervision   Ambulation/Gait Assistance Details verbal cues for increased heel strike LLE, noted increased foot slap LLE.   Ambulation Distance (Feet) 630 Feet  630x1 indoor, 500x1 outdoor   Assistive device Prosthesis;Straight cane   Gait Pattern Step-through pattern;Decreased step length - left;Narrow base of support    Ambulation Surface Level;Indoor;Outdoor;Unlevel  Outdoor through grass, gravel, and uneven surfaces.   Stairs Yes   Stairs Assistance 5: Supervision   Stairs Assistance Details (indicate cue type and reason) Verbal cues for sequence.   Stair Management Technique One rail Right;One rail Left;Step to pattern;Forwards;With cane   Number of Stairs 4   Ramp 5: Supervision   Ramp Details (indicate cue type and reason) Verbal cues for sequencing. Indoor and outdoor.   Curb 5: Supervision;4: Min assist   Curb Details (indicate cue type and reason) Patient had single LOB when ascending indoor curb with cane still on ground; patient leaned to side with self recovery. Patient negotiated outdoor curb with cane and prosthesis well.   Neuro Re-ed    Neuro Re-ed Details  // Bars no UE support eyes closed head nod,eyes closed head shake, eyes closed diagonals, diagonals with distractions, all for 1 set of 30 seconds each with min/guard to minA to decrease fall risk and increase balance necessary for safe community ambulation.          PT Education - 05/09/15 0933    Education provided Yes   Education Details Discussed single point cane usage for short distances or in home. Patient appears comfortable with navigating indoor and outdoor obstacles including grass, gravel, ramps, and curbs.    Person(s) Educated Patient   Methods Explanation   Comprehension Verbalized understanding;Returned demonstration          PT Short Term Goals - 04/29/15 0930    PT SHORT TERM GOAL #1   Title Patient tolerates wear >8 hours total /day without negative changes to current wounds & no new wounds. (Target Date: 05/17/2015)   Time 4   Period Weeks   Status On-going   PT SHORT TERM GOAL #2   Title Patient verbalizes proper cleaning of prosthesis and demonstrates proper donning modified independent. (Target Date: 05/17/2015)   Time 4   Period Months   Status On-going   PT SHORT TERM GOAL #3   Title Patient reaches  5" anteriorly, laterally, across midline & to floor without UE support with supervision. (Target Date: 05/17/2015)   Time 4   Period Weeks   Status On-going   PT SHORT TERM GOAL #4   Title Patient ambulates 300' & negotiates ramps /curbs with RW / prosthesis modified independent. (Target Date: 05/17/2015)   Time 4   Period Weeks   Status On-going   PT SHORT TERM GOAL #5   Title Patient ambulates 100' with single point cane & prosthesis with minimal assist. (Target Date: 05/17/2015)   Time 4   Period Weeks   Status On-going           PT Long Term Goals - 04/29/15 0930    PT LONG TERM GOAL #1   Title Patient is independent in prosthetic care to enable safe use of prosthesis. (Target Date: 06/14/2015)  Time 8   Period Weeks   Status On-going   PT LONG TERM GOAL #2   Title Patient tolerates wear of prosthesis >90% of awake hours without skin intergrity issues or limb tenderness to enable function most of awake hours. (Target Date: 06/14/2015)   Time 8   Period Weeks   Status On-going   PT LONG TERM GOAL #3   Title Berg Balance >45/56 to indicate lower fall risk. (Target Date: 06/14/2015)   Time 8   Period Weeks   Status On-going   PT LONG TERM GOAL #4   Title Patient ambulates 100' carrying laundry basket around furniture with prosthesis only modified indepenent. (Target Date: 06/14/2015)   Time 8   Period Weeks   Status On-going   PT LONG TERM GOAL #5   Title Patient ambulates 500' including grass, ramps, curbs & stairs (1 rail) with single point cane & prosthesis modified independent. (Target Date: 06/14/2015)   Time 8   Period Weeks   Status On-going          Plan - 05/09/15 1610    Clinical Impression Statement Skilled session addressing gait with barriers with single point cane and balance deficits with patient appearing comfortable with cane usage for short distance in familiar environments, both indoor and outdoor, including obstacles such as grass, gravel, ramps, and  curbs. Patient is comfortable challenging balance and is making considerable progress toward goals.   Pt will benefit from skilled therapeutic intervention in order to improve on the following deficits Abnormal gait;Decreased activity tolerance;Decreased balance;Decreased endurance;Decreased knowledge of use of DME;Decreased mobility;Decreased range of motion;Decreased strength;Postural dysfunction;Prosthetic Dependency   Rehab Potential Good   PT Frequency 2x / week   PT Duration 8 weeks   PT Treatment/Interventions ADLs/Self Care Home Management;DME Instruction;Gait training;Stair training;Functional mobility training;Therapeutic activities;Therapeutic exercise;Balance training;Neuromuscular re-education;Patient/family education;Prosthetic Training;Vestibular   PT Next Visit Plan Follow-up on cane usage, continue with gait and balance training, consider adding hip strengthening to address deficits in flexion and abduction strength.   Consulted and Agree with Plan of Care Patient        Problem List Patient Active Problem List   Diagnosis Date Noted  . Status post amputation 01/28/2015  . Neuropathy (HCC) 01/28/2015  . PCP NOTES >>>> 01/08/2015  . Below knee amputation status (HCC) 09/28/2014  . Osteomyelitis (HCC) 08/29/2014  . 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 Generalized weakness   . GI bleed 08/15/2014  . Acute blood loss anemia 08/15/2014  . Acute GI bleeding 08/15/2014  . Gastrointestinal hemorrhage with melena   . Prediabetes 07/20/2014  . Heart murmur 05/11/2014  . S/P transmetatarsal amputation of foot (HCC) 01/03/2014  . PVD (peripheral vascular disease) (HCC) 01/20/2013  . Lesion of ulnar nerve 07/22/2012  . Edema 10/09/2011  . Hyperglycemia 10/09/2011  . Hemorrhoid 10/09/2011  . DJD (degenerative joint disease) 07/10/2011  . Peripheral neuropathy (HCC) 04/17/2011  . Alcohol abuse 01/22/2011  . Annual physical exam 09/26/2010  . FATIGUE 09/14/2008  .  ERECTILE DYSFUNCTION 01/26/2008  . Hyperlipidemia 06/11/2006  . Anxiety and depression 06/11/2006  . HTN (hypertension) 06/11/2006  . s/t transmetatarsal amputation due to osteomyelitis 06/11/2006  . Insomnia 06/11/2006    Otis Dials, SPTA 05/09/2015, 9:47 AM  [facility] [address] [place], [place], [address] Phone: [phone]   Fax:  [phone]  Name: ROCHELLE NEPHEW MRN: [id number] Date of Birth: 1940-06-13  This note has been reviewed and edited by supervising  CI.  Sallyanne Kuster, PTA, [facility] Outpatient [facility] [address] [place], [place] [address] [phone] 05/09/2015, 1:42 PM

## 2015-05-14 ENCOUNTER — Encounter: Payer: Self-pay | Admitting: Physical Therapy

## 2015-05-14 ENCOUNTER — Ambulatory Visit: Payer: Medicare Other | Admitting: Physical Therapy

## 2015-05-14 DIAGNOSIS — R2689 Other abnormalities of gait and mobility: Secondary | ICD-10-CM

## 2015-05-14 DIAGNOSIS — R2681 Unsteadiness on feet: Secondary | ICD-10-CM

## 2015-05-14 DIAGNOSIS — R6889 Other general symptoms and signs: Secondary | ICD-10-CM

## 2015-05-14 DIAGNOSIS — M2560 Stiffness of unspecified joint, not elsewhere classified: Secondary | ICD-10-CM

## 2015-05-14 DIAGNOSIS — Z7409 Other reduced mobility: Secondary | ICD-10-CM

## 2015-05-14 DIAGNOSIS — Z89511 Acquired absence of right leg below knee: Secondary | ICD-10-CM

## 2015-05-14 DIAGNOSIS — Z89432 Acquired absence of left foot: Secondary | ICD-10-CM

## 2015-05-14 DIAGNOSIS — R531 Weakness: Secondary | ICD-10-CM

## 2015-05-14 DIAGNOSIS — Z4789 Encounter for other orthopedic aftercare: Secondary | ICD-10-CM

## 2015-05-14 DIAGNOSIS — R269 Unspecified abnormalities of gait and mobility: Secondary | ICD-10-CM

## 2015-05-14 NOTE — Therapy (Signed)
[facility] [address] [place], [place], [address] Phone: [phone]   Fax:  [phone]  Physical Therapy Treatment  Patient Details  Name: Gene [name] MRN: [id number] Date of Birth: Feb 16, 1941 Referring Provider: Meridee Score, MD  Encounter Date: 05/14/2015      PT End of Session - 05/14/15 0930    Visit Number 8   Number of Visits 17   Date for PT Re-Evaluation 06/17/15   Authorization Type Medicare G-code & progress note every 10th visit   PT Start Time 0845   PT Stop Time 0933   PT Time Calculation (min) 48 min   Equipment Utilized During Treatment Gait belt   Activity Tolerance Patient tolerated treatment well   Behavior During Therapy [facility] for tasks assessed/performed      Past Medical History  Diagnosis Date  . Anxiety and depression 1995    symptoms started aprox 1995, after a divorce  . Hyperlipidemia     high TG  . Hypertension   . Neuropathy ([place])   . 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 History of alcohol abuse   . Osteomyelitis of ankle and foot ([place]) 10/2013    left  . Family history of anesthesia complication     "father would start seeing things"  . Anxiety   . Arthritis   . Ulcer of esophagus   . Heart murmur     as a baby " outgrew" " small one per Dr Larose Kells.  . PVD (peripheral vascular disease) (HCC)     PVD, mild saw Dr Gwenlyn Found 2012, intolerant to pletal, Rx observation  . History of blood transfusion   . Anemia     Past Surgical History  Procedure Laterality Date  . Cholecystectomy    . Tonsillectomy    . Amputation Left 11/20/13 and 11/24/13    middle  . Amputation Left 01/03/2014    Procedure: Left Transmetatarsal Amputation;  Surgeon: Newt Minion, MD;  Location: [place];  Service: Orthopedics;  Laterality: Left;  . Transmetatarsal amputation Left 10/2013  . Colonoscopy    . Amputation Right 07/27/2014    Procedure: Right 4th Ray  Amputation;  Surgeon: Newt Minion, MD;  Location: [place];  Service: Orthopedics;  Laterality: Right;  . Esophagogastroduodenoscopy N/A 08/16/2014    Procedure: ESOPHAGOGASTRODUODENOSCOPY (EGD);  Surgeon: Clarene Essex, MD;  Location: [facility] ENDOSCOPY;  Service: Endoscopy;  Laterality: N/A;  . Esophagogastroduodenoscopy (egd) with propofol N/A 08/17/2014    Procedure: ESOPHAGOGASTRODUODENOSCOPY (EGD) WITH PROPOFOL;  Surgeon: Wonda Horner, MD;  Location: [facility] ENDOSCOPY;  Service: Endoscopy;  Laterality: N/A;  . Toe amputation 4th r  07-27-2014  . Transmetatarsal amputation Right 07/06/02016  . Amputation Right 09/12/2014    Procedure: RIGHT TRANSMETATARSAL AMPUTATION;  Surgeon: Newt Minion, MD;  Location: [place];  Service: Orthopedics;  Laterality: Right;  . Amputation Right 09/28/2014    Procedure: Right Below Knee Amputation;  Surgeon: Newt Minion, MD;  Location: [place];  Service: Orthopedics;  Laterality: Right;    There were no vitals filed for this visit.  Visit Diagnosis:  Abnormality of gait  Unsteadiness  Balance problems  Encounter for prosthetic gait training  Weakness generalized  Stiffness due to immobility  Status post below knee amputation of right lower extremity (HCC)  Decreased functional activity tolerance  S/P transmetatarsal amputation of foot, left (HCC)      Subjective Assessment - 05/14/15 0845  Subjective Wearing prosthesis all awake hours drying 1-2 times per day. Patient reports donning after bath while still updstairs and doffing when back upstairs getting ready for bed. No issues.    Pertinent History HTN, Neuropathy, hx of alcohol abuse, Ulnar nerve lesions bil. , arthritis, PVD, Left foot Trans Metatarsal amputation 01/03/2014.    Patient Stated Goals He wants to use prosthesis to get around home & community.    Currently in Pain? No/denies                         [facility] Adult PT Treatment/Exercise - 05/14/15 1334    Ambulation/Gait    Ambulation/Gait --   Ambulation/Gait Assistance --   Ambulation Distance (Feet) --   Assistive device --   Gait Pattern --   Stairs --   Stairs Assistance --   Stair Management Technique --   Number of Stairs --   Ramp --   Curb --   Neuro Re-ed    Neuro Re-ed Details  --                  PT Short Term Goals - 05/14/15 1343    PT SHORT TERM GOAL #1   Title Patient tolerates wear >8 hours total /day without negative changes to current wounds & no new wounds. (Target Date: 05/17/2015)   Baseline MET 05/14/2015   Time 4   Period Weeks   Status Achieved   PT SHORT TERM GOAL #2   Title Patient verbalizes proper cleaning of prosthesis and demonstrates proper donning modified independent. (Target Date: 05/17/2015)   Baseline MET 05/14/2015   Time 4   Period Months   Status Achieved   PT SHORT TERM GOAL #3   Title Patient reaches 5" anteriorly, laterally, across midline & to floor without UE support with supervision. (Target Date: 05/17/2015)   Baseline MET 05/14/2015   Time 4   Period Weeks   Status Achieved   PT SHORT TERM GOAL #4   Title Patient ambulates 300' & negotiates ramps /curbs with RW / prosthesis modified independent. (Target Date: 05/17/2015)   Baseline MET 05/14/2015   Time 4   Period Weeks   Status Achieved   PT SHORT TERM GOAL #5   Title Patient ambulates 100' with single point cane & prosthesis with minimal assist. (Target Date: 05/17/2015)   Baseline MET 05/14/2015   Time 4   Period Weeks   Status Achieved           PT Long Term Goals - 04/29/15 0930    PT LONG TERM GOAL #1   Title Patient is independent in prosthetic care to enable safe use of prosthesis. (Target Date: 06/14/2015)   Time 8   Period Weeks   Status On-going   PT LONG TERM GOAL #2   Title Patient tolerates wear of prosthesis >90% of awake hours without skin intergrity issues or limb tenderness to enable function most of awake hours. (Target Date: 06/14/2015)   Time 8   Period Weeks    Status On-going   PT LONG TERM GOAL #3   Title Berg Balance >45/56 to indicate lower fall risk. (Target Date: 06/14/2015)   Time 8   Period Weeks   Status On-going   PT LONG TERM GOAL #4   Title Patient ambulates 100' carrying laundry basket around furniture with prosthesis only modified indepenent. (Target Date: 06/14/2015)   Time 8   Period Weeks   Status On-going  PT LONG TERM GOAL #5   Title Patient ambulates 500' including grass, ramps, curbs & stairs (1 rail) with single point cane & prosthesis modified independent. (Target Date: 06/14/2015)   Time 8   Period Weeks   Status On-going               Plan - 05/14/15 1345    Clinical Impression Statement Patient met all STGs set for first 4 weeks. Patient improved gait with cane but needs assistance with community based activities. He appears safe to use a cane in a home situation and walker for community at this time.    Pt will benefit from skilled therapeutic intervention in order to improve on the following deficits Abnormal gait;Decreased activity tolerance;Decreased balance;Decreased endurance;Decreased knowledge of use of DME;Decreased mobility;Decreased range of motion;Decreased strength;Postural dysfunction;Prosthetic Dependency   Rehab Potential Good   PT Frequency 2x / week   PT Duration 8 weeks   PT Treatment/Interventions ADLs/Self Care Home Management;DME Instruction;Gait training;Stair training;Functional mobility training;Therapeutic activities;Therapeutic exercise;Balance training;Neuromuscular re-education;Patient/family education;Prosthetic Training;Vestibular   PT Next Visit Plan prosthetic gait & balance working towards LTGs   Consulted and Agree with Plan of Care Patient        Problem List Patient Active Problem List   Diagnosis Date Noted  . Status post amputation 01/28/2015  . Neuropathy ([place]) 01/28/2015  . PCP NOTES >>>> 01/08/2015  . Below knee amputation status ([place]) 09/28/2014  . Osteomyelitis  ([place]) 08/29/2014  . 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 Generalized weakness   . GI bleed 08/15/2014  . Acute blood loss anemia 08/15/2014  . Acute GI bleeding 08/15/2014  . Gastrointestinal hemorrhage with melena   . Prediabetes 07/20/2014  . Heart murmur 05/11/2014  . S/P transmetatarsal amputation of foot ([place]) 01/03/2014  . PVD (peripheral vascular disease) ([place]) 01/20/2013  . Lesion of ulnar nerve 07/22/2012  . Edema 10/09/2011  . Hyperglycemia 10/09/2011  . Hemorrhoid 10/09/2011  . DJD (degenerative joint disease) 07/10/2011  . Peripheral neuropathy ([place]) 04/17/2011  . Alcohol abuse 01/22/2011  . Annual physical exam 09/26/2010  . FATIGUE 09/14/2008  . ERECTILE DYSFUNCTION 01/26/2008  . Hyperlipidemia 06/11/2006  . Anxiety and depression 06/11/2006  . HTN (hypertension) 06/11/2006  . s/t transmetatarsal amputation due to osteomyelitis 06/11/2006  . Insomnia 06/11/2006    Onesti Bonfiglio PT, DPT 05/14/2015, 1:48 PM  [facility] [address] [place], [place], [address] Phone: [phone]   Fax:  [phone]  Name: COULTON SCHLINK MRN: [id number] Date of Birth: [birth year]

## 2015-05-16 ENCOUNTER — Ambulatory Visit: Payer: Medicare Other | Admitting: Physical Therapy

## 2015-05-16 ENCOUNTER — Encounter: Payer: Self-pay | Admitting: Physical Therapy

## 2015-05-16 DIAGNOSIS — R2689 Other abnormalities of gait and mobility: Secondary | ICD-10-CM

## 2015-05-16 DIAGNOSIS — R269 Unspecified abnormalities of gait and mobility: Secondary | ICD-10-CM

## 2015-05-16 DIAGNOSIS — Z4789 Encounter for other orthopedic aftercare: Secondary | ICD-10-CM

## 2015-05-16 DIAGNOSIS — R2681 Unsteadiness on feet: Secondary | ICD-10-CM

## 2015-05-16 NOTE — Therapy (Signed)
[facility] [address] [place], [place], [address] Phone: [phone]   Fax:  [phone]  Physical Therapy Treatment  Patient Details  Name: Gene [name] MRN: [id number] Date of Birth: 06/22/1940 Referring Provider: Meridee Score, MD  Encounter Date: 05/16/2015      PT End of Session - 05/16/15 1045    Visit Number 9   Number of Visits 17   Date for PT Re-Evaluation 06/17/15   Authorization Type Medicare G-code & progress note every 10th visit   PT Start Time 0846   PT Stop Time 0929   PT Time Calculation (min) 43 min   Equipment Utilized During Treatment Gait belt   Activity Tolerance Patient tolerated treatment well   Behavior During Therapy [facility] for tasks assessed/performed      Past Medical History  Diagnosis Date  . Anxiety and depression 1995    symptoms started aprox 1995, after a divorce  . Hyperlipidemia     high TG  . Hypertension   . Neuropathy ([place])   . 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 History of alcohol abuse   . Osteomyelitis of ankle and foot ([place]) 10/2013    left  . Family history of anesthesia complication     "father would start seeing things"  . Anxiety   . Arthritis   . Ulcer of esophagus   . Heart murmur     as a baby " outgrew" " small one per Dr Larose Kells.  . PVD (peripheral vascular disease) (HCC)     PVD, mild saw Dr Gwenlyn Found 2012, intolerant to pletal, Rx observation  . History of blood transfusion   . Anemia     Past Surgical History  Procedure Laterality Date  . Cholecystectomy    . Tonsillectomy    . Amputation Left 11/20/13 and 11/24/13    middle  . Amputation Left 01/03/2014    Procedure: Left Transmetatarsal Amputation;  Surgeon: Newt Minion, MD;  Location: [place];  Service: Orthopedics;  Laterality: Left;  . Transmetatarsal amputation Left 10/2013  . Colonoscopy    . Amputation Right 07/27/2014    Procedure: Right 4th Ray  Amputation;  Surgeon: Newt Minion, MD;  Location: [place];  Service: Orthopedics;  Laterality: Right;  . Esophagogastroduodenoscopy N/A 08/16/2014    Procedure: ESOPHAGOGASTRODUODENOSCOPY (EGD);  Surgeon: Clarene Essex, MD;  Location: [facility] ENDOSCOPY;  Service: Endoscopy;  Laterality: N/A;  . Esophagogastroduodenoscopy (egd) with propofol N/A 08/17/2014    Procedure: ESOPHAGOGASTRODUODENOSCOPY (EGD) WITH PROPOFOL;  Surgeon: Wonda Horner, MD;  Location: [facility] ENDOSCOPY;  Service: Endoscopy;  Laterality: N/A;  . Toe amputation 4th r  07-27-2014  . Transmetatarsal amputation Right 07/06/02016  . Amputation Right 09/12/2014    Procedure: RIGHT TRANSMETATARSAL AMPUTATION;  Surgeon: Newt Minion, MD;  Location: [place];  Service: Orthopedics;  Laterality: Right;  . Amputation Right 09/28/2014    Procedure: Right Below Knee Amputation;  Surgeon: Newt Minion, MD;  Location: [place];  Service: Orthopedics;  Laterality: Right;    There were no vitals filed for this visit.  Visit Diagnosis:  Abnormality of gait  Unsteadiness  Balance problems  Encounter for prosthetic gait training      Subjective Assessment - 05/16/15 0856    Subjective Wearing prosthesis all awake hours drying 1-2 times per day. Patient reports donning after bath while still updstairs and doffing when back upstairs getting ready for bed. No issues. No new  falls.   Pertinent History HTN, Neuropathy, hx of alcohol abuse, Ulnar nerve lesions bil. , arthritis, PVD, Left foot Trans Metatarsal amputation 01/03/2014.    Patient Stated Goals He wants to use prosthesis to get around home & community.    Currently in Pain? No/denies          [facility] Adult PT Treatment/Exercise - 05/16/15 0911    Ambulation/Gait   Ambulation/Gait Yes   Ambulation/Gait Assistance 5: Supervision   Ambulation/Gait Assistance Details verbal cues for increased stride length, full weight bearing on prosthesis.   Ambulation Distance (Feet) 420 Feet   Assistive device  Prosthesis;Straight cane   Gait Pattern Step-through pattern;Decreased step length - left;Narrow base of support   Ambulation Surface Level;Indoor   Stairs Yes   Stairs Assistance 5: Supervision   Stairs Assistance Details (indicate cue type and reason) Verbal cues for sequencing and clearance.   Stair Management Technique One rail Right;One rail Left;Forwards;With cane;Alternating pattern  Step-to pattern; progressed to step-through pattern.   Number of Stairs 4   Ramp 5: Supervision   Ramp Details (indicate cue type and reason) Verbal cues for weight shift and posture.   Curb 5: Supervision   Curb Details (indicate cue type and reason) Verbal cues on sequence and clearance.   Neuro Re-ed    Neuro Re-ed Details  Corner exercises narrow BOS: head shake, head nod, alternating/bilateral UE raises, diagonals; all exercises repeated standing on pillows for 10 reps each exercise to decrease fall risk.   Exercises   Exercises Knee/Hip   Knee/Hip Exercises: Standing   Heel Raises Both;1 set;10 reps   Hip Flexion Stengthening;Both;1 set;10 reps   Hip Abduction Stengthening;Both;1 set;10 reps   Hip Extension Stengthening;Both;1 set;10 reps          PT Short Term Goals - 05/14/15 1343    PT SHORT TERM GOAL #1   Title Patient tolerates wear >8 hours total /day without negative changes to current wounds & no new wounds. (Target Date: 05/17/2015)   Baseline MET 05/14/2015   Time 4   Period Weeks   Status Achieved   PT SHORT TERM GOAL #2   Title Patient verbalizes proper cleaning of prosthesis and demonstrates proper donning modified independent. (Target Date: 05/17/2015)   Baseline MET 05/14/2015   Time 4   Period Months   Status Achieved   PT SHORT TERM GOAL #3   Title Patient reaches 5" anteriorly, laterally, across midline & to floor without UE support with supervision. (Target Date: 05/17/2015)   Baseline MET 05/14/2015   Time 4   Period Weeks   Status Achieved   PT SHORT TERM GOAL #4    Title Patient ambulates 300' & negotiates ramps /curbs with RW / prosthesis modified independent. (Target Date: 05/17/2015)   Baseline MET 05/14/2015   Time 4   Period Weeks   Status Achieved   PT SHORT TERM GOAL #5   Title Patient ambulates 100' with single point cane & prosthesis with minimal assist. (Target Date: 05/17/2015)   Baseline MET 05/14/2015   Time 4   Period Weeks   Status Achieved          PT Long Term Goals - 04/29/15 0930    PT LONG TERM GOAL #1   Title Patient is independent in prosthetic care to enable safe use of prosthesis. (Target Date: 06/14/2015)   Time 8   Period Weeks   Status On-going   PT LONG TERM GOAL #2   Title Patient tolerates wear  of prosthesis >90% of awake hours without skin intergrity issues or limb tenderness to enable function most of awake hours. (Target Date: 06/14/2015)   Time 8   Period Weeks   Status On-going   PT LONG TERM GOAL #3   Title Berg Balance >45/56 to indicate lower fall risk. (Target Date: 06/14/2015)   Time 8   Period Weeks   Status On-going   PT LONG TERM GOAL #4   Title Patient ambulates 100' carrying laundry basket around furniture with prosthesis only modified indepenent. (Target Date: 06/14/2015)   Time 8   Period Weeks   Status On-going   PT LONG TERM GOAL #5   Title Patient ambulates 500' including grass, ramps, curbs & stairs (1 rail) with single point cane & prosthesis modified independent. (Target Date: 06/14/2015)   Time 8   Period Weeks   Status On-going          Plan - 05/16/15 1046    Clinical Impression Statement Skilled session addressing deficits in hip strength, balance, and gait with barriers. Patient appears more comfortable with barriers and requires verbal cues with their navigation. Patient is making consistent progress toward goals.    Pt will benefit from skilled therapeutic intervention in order to improve on the following deficits Abnormal gait;Decreased activity tolerance;Decreased balance;Decreased  endurance;Decreased knowledge of use of DME;Decreased mobility;Decreased range of motion;Decreased strength;Postural dysfunction;Prosthetic Dependency   Rehab Potential Good   PT Frequency 2x / week   PT Duration 8 weeks   PT Treatment/Interventions ADLs/Self Care Home Management;DME Instruction;Gait training;Stair training;Functional mobility training;Therapeutic activities;Therapeutic exercise;Balance training;Neuromuscular re-education;Patient/family education;Prosthetic Training;Vestibular   PT Next Visit Plan prosthetic gait & balance working towards LTGs. G CODE DUE NEXT VISIT.   Consulted and Agree with Plan of Care Patient      Problem List Patient Active Problem List   Diagnosis Date Noted  . Status post amputation 01/28/2015  . Neuropathy ([place]) 01/28/2015  . PCP NOTES >>>> 01/08/2015  . Below knee amputation status ([place]) 09/28/2014  . Osteomyelitis ([place]) 08/29/2014  . 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 Generalized weakness   . GI bleed 08/15/2014  . Acute blood loss anemia 08/15/2014  . Acute GI bleeding 08/15/2014  . Gastrointestinal hemorrhage with melena   . Prediabetes 07/20/2014  . Heart murmur 05/11/2014  . S/P transmetatarsal amputation of foot ([place]) 01/03/2014  . PVD (peripheral vascular disease) ([place]) 01/20/2013  . Lesion of ulnar nerve 07/22/2012  . Edema 10/09/2011  . Hyperglycemia 10/09/2011  . Hemorrhoid 10/09/2011  . DJD (degenerative joint disease) 07/10/2011  . Peripheral neuropathy ([place]) 04/17/2011  . Alcohol abuse 01/22/2011  . Annual physical exam 09/26/2010  . FATIGUE 09/14/2008  . ERECTILE DYSFUNCTION 01/26/2008  . Hyperlipidemia 06/11/2006  . Anxiety and depression 06/11/2006  . HTN (hypertension) 06/11/2006  . s/t transmetatarsal amputation due to osteomyelitis 06/11/2006  . Insomnia 06/11/2006    Bayard Beaver, SPTA 05/16/2015, 10:57 AM  [facility] [address] [place], [place], [address] Phone: [phone]   Fax:  [phone]  Name: Gene [name] MRN: [id number] Date of Birth: [birth year]  This note has been reviewed and edited by supervising CI.  [name], PTA, [facility] [address], [place], [place] [address] [phone] 05/17/2015, 11:28 AM

## 2015-05-20 ENCOUNTER — Ambulatory Visit: Payer: Medicare Other | Admitting: Physical Therapy

## 2015-05-20 ENCOUNTER — Encounter: Payer: Self-pay | Admitting: Physical Therapy

## 2015-05-20 DIAGNOSIS — R269 Unspecified abnormalities of gait and mobility: Secondary | ICD-10-CM

## 2015-05-20 DIAGNOSIS — R2681 Unsteadiness on feet: Secondary | ICD-10-CM

## 2015-05-20 DIAGNOSIS — Z4789 Encounter for other orthopedic aftercare: Secondary | ICD-10-CM

## 2015-05-20 DIAGNOSIS — R531 Weakness: Secondary | ICD-10-CM

## 2015-05-20 DIAGNOSIS — R2689 Other abnormalities of gait and mobility: Secondary | ICD-10-CM

## 2015-05-20 NOTE — Therapy (Addendum)
[facility] [address] [place], [place], [address] Phone: [phone]   Fax:  [phone]  Physical Therapy Treatment  Patient Details  Name: Gene [name] MRN: [id number] Date of Birth: 11-02-1940 Referring Provider: Meridee Score, MD  Encounter Date: 05/20/2015      PT End of Session - 05/20/15 0940    Visit Number 10   Number of Visits 17   Date for PT Re-Evaluation 06/17/15   Authorization Type Medicare G-code & progress note every 10th visit   PT Start Time 0846   PT Stop Time 0928   PT Time Calculation (min) 42 min   Equipment Utilized During Treatment Gait belt   Activity Tolerance Patient tolerated treatment well   Behavior During Therapy [facility] for tasks assessed/performed      Past Medical History  Diagnosis Date  . Anxiety and depression 1995    symptoms started aprox 1995, after a divorce  . Hyperlipidemia     high TG  . Hypertension   . Neuropathy ([place])   . 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 History of alcohol abuse   . Osteomyelitis of ankle and foot ([place]) 10/2013    left  . Family history of anesthesia complication     "father would start seeing things"  . Anxiety   . Arthritis   . Ulcer of esophagus   . Heart murmur     as a baby " outgrew" " small one per Dr Larose Kells.  . PVD (peripheral vascular disease) (HCC)     PVD, mild saw Dr Gwenlyn Found 2012, intolerant to pletal, Rx observation  . History of blood transfusion   . Anemia     Past Surgical History  Procedure Laterality Date  . Cholecystectomy    . Tonsillectomy    . Amputation Left 11/20/13 and 11/24/13    middle  . Amputation Left 01/03/2014    Procedure: Left Transmetatarsal Amputation;  Surgeon: Newt Minion, MD;  Location: [facility];  Service: Orthopedics;  Laterality: Left;  . Transmetatarsal amputation Left 10/2013  . Colonoscopy    . Amputation Right 07/27/2014    Procedure: Right 4th Ray  Amputation;  Surgeon: Newt Minion, MD;  Location: [facility];  Service: Orthopedics;  Laterality: Right;  . Esophagogastroduodenoscopy N/A 08/16/2014    Procedure: ESOPHAGOGASTRODUODENOSCOPY (EGD);  Surgeon: Clarene Essex, MD;  Location: [facility] ENDOSCOPY;  Service: Endoscopy;  Laterality: N/A;  . Esophagogastroduodenoscopy (egd) with propofol N/A 08/17/2014    Procedure: ESOPHAGOGASTRODUODENOSCOPY (EGD) WITH PROPOFOL;  Surgeon: Wonda Horner, MD;  [facility]COPY;  Service: Endoscopy;  Laterality: N/A;  . Toe amputation 4th r  07-27-2014  . Transmetatarsal amputation Right 07/06/02016  . Amputation Right 09/12/2014    Procedure: RIGHT TRANSMETATARSAL AMPUTATION;  Surgeon: Newt Minion, MD;  Location: [facility];  Service: Orthopedics;  Laterality: Right;  . Amputation Right 09/28/2014    Procedure: Right Below Knee Amputation;  Surgeon: Newt Minion, MD;  Location: [facility];  Service: Orthopedics;  Laterality: Right;    There were no vitals filed for this visit.  Visit Diagnosis:  Abnormality of gait  Unsteadiness  Balance problems  Weakness generalized  Encounter for prosthetic gait training      Subjective Assessment - 05/20/15 0850    Subjective Wearing prosthesis all awake hours drying 1-2 times per day. Patient reports ambulating around house without AD and states he is growing more comfortable being mobile around the  house. No issues. No new falls.   Pertinent History HTN, Neuropathy, hx of alcohol abuse, Ulnar nerve lesions bil. , arthritis, PVD, Left foot Trans Metatarsal amputation 01/03/2014.    Patient Stated Goals He wants to use prosthesis to get around home & community.    Currently in Pain? Yes   Pain Score 5    Pain Location Back   Pain Orientation Lower   Pain Onset More than a month ago          [facility] Adult PT Treatment/Exercise - 05/20/15 0904    Ambulation/Gait   Ambulation/Gait Yes   Ambulation/Gait Assistance 5: Supervision   Ambulation/Gait Assistance Details Verbal  cues for full weight bearing on prosthesis/controlling descent RLE.   Ambulation Distance (Feet) 840 Feet  One trial 420 feet with no cane. min/guard   Assistive device Prosthesis;Straight cane   Gait Pattern Step-through pattern;Decreased step length - left;Narrow base of support   Ambulation Surface Level;Indoor   Ramp 4: Min assist no device;min guard with cane   Ramp Details (indicate cue type and reason) Verbal cues for weight shift and safety awareness, no cane required increased assistance. X 5-6 reps   Curb 4: Min assist;3: Mod assist   Curb Details (indicate cue type and reason) Mod assist with no AD; verbal cues for safety awareness, sequencing, using hand on knee for stability. Min assist with use of cane x1; x5-6 without AD Single LOB forward ascending curb with clincian recovery.   Exercises   Exercises Knee/Hip   Knee/Hip Exercises: Standing   Heel Raises Both;1 set;10 reps   Hip Flexion Stengthening;Both;1 set;10 reps   Hip Abduction Stengthening;Both;1 set;10 reps   Hip Extension Stengthening;Both;1 set;10 reps      Prosthetic Gait Training - Hallway with prosthesis and single point cane with head turns 50' x 8 with head movement up/down, left/right, diagonals, diagonals at random min/guard to supervision with verbal cues for weight shifting and controlling descent with LLE.        PT Short Term Goals - 05/14/15 1343    PT SHORT TERM GOAL #1   Title Patient tolerates wear >8 hours total /day without negative changes to current wounds & no new wounds. (Target Date: 05/17/2015)   Baseline MET 05/14/2015   Time 4   Period Weeks   Status Achieved   PT SHORT TERM GOAL #2   Title Patient verbalizes proper cleaning of prosthesis and demonstrates proper donning modified independent. (Target Date: 05/17/2015)   Baseline MET 05/14/2015   Time 4   Period Months   Status Achieved   PT SHORT TERM GOAL #3   Title Patient reaches 5" anteriorly, laterally, across midline & to floor  without UE support with supervision. (Target Date: 05/17/2015)   Baseline MET 05/14/2015   Time 4   Period Weeks   Status Achieved   PT SHORT TERM GOAL #4   Title Patient ambulates 300' & negotiates ramps /curbs with RW / prosthesis modified independent. (Target Date: 05/17/2015)   Baseline MET 05/14/2015   Time 4   Period Weeks   Status Achieved   PT SHORT TERM GOAL #5   Title Patient ambulates 100' with single point cane & prosthesis with minimal assist. (Target Date: 05/17/2015)   Baseline MET 05/14/2015   Time 4   Period Weeks   Status Achieved           PT Long Term Goals - 04/29/15 0930    PT LONG TERM GOAL #1   Title  Patient is independent in prosthetic care to enable safe use of prosthesis. (Target Date: 06/14/2015)   Time 8   Period Weeks   Status On-going   PT LONG TERM GOAL #2   Title Patient tolerates wear of prosthesis >90% of awake hours without skin intergrity issues or limb tenderness to enable function most of awake hours. (Target Date: 06/14/2015)   Time 8   Period Weeks   Status On-going   PT LONG TERM GOAL #3   Title Berg Balance >45/56 to indicate lower fall risk. (Target Date: 06/14/2015)   Time 8   Period Weeks   Status On-going   PT LONG TERM GOAL #4   Title Patient ambulates 100' carrying laundry basket around furniture with prosthesis only modified indepenent. (Target Date: 06/14/2015)   Time 8   Period Weeks   Status On-going   PT LONG TERM GOAL #5   Title Patient ambulates 500' including grass, ramps, curbs & stairs (1 rail) with single point cane & prosthesis modified independent. (Target Date: 06/14/2015)   Time 8   Period Weeks   Status On-going          Plan - 06-13-15 0940    Clinical Impression Statement Skilled session addressing abnormalities in gait with and without cane on indoor, level surfaces and with barriers. Patient is becoming extremely comfortable with cane and has reported some ambulation within the home without the cane without  feeling uneasy or needing extra support. Patient is making steady progress toward goals.   Pt will benefit from skilled therapeutic intervention in order to improve on the following deficits Abnormal gait;Decreased activity tolerance;Decreased balance;Decreased endurance;Decreased knowledge of use of DME;Decreased mobility;Decreased range of motion;Decreased strength;Postural dysfunction;Prosthetic Dependency   Rehab Potential Good   PT Frequency 2x / week   PT Duration 8 weeks   PT Treatment/Interventions ADLs/Self Care Home Management;DME Instruction;Gait training;Stair training;Functional mobility training;Therapeutic activities;Therapeutic exercise;Balance training;Neuromuscular re-education;Patient/family education;Prosthetic Training;Vestibular   PT Next Visit Plan prosthetic gait & balance working towards LTGs. Addres hip/knee weakness. Consider dynamic balance challenges.   Consulted and Agree with Plan of Care Patient          G-Codes - 06/13/15 0945    Functional Assessment Tool Used Prosthetic care - patient wearing all waking hours removing 2x/day for drying and cleaning. Skin appears in good health - dry, normal temperature, no areas of concern. Patient independent with sock management.      Problem List Patient Active Problem List   Diagnosis Date Noted  . Status post amputation 01/28/2015  . Neuropathy ([place]) 01/28/2015  . PCP NOTES >>>> 01/08/2015  . 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 Osteomyelitis ([place]) 08/29/2014  . 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 Generalized weakness   . GI bleed 08/15/2014  . Acute blood loss anemia 08/15/2014  . Acute GI bleeding 08/15/2014  . Gastrointestinal hemorrhage with melena   . Prediabetes 07/20/2014  . Heart murmur 05/11/2014  . S/P transmetatarsal amputation of foot ([place]) 01/03/2014  . PVD (peripheral vascular disease) ([place]) 01/20/2013  . Lesion of ulnar nerve 07/22/2012  . Edema 10/09/2011  .  Hyperglycemia 10/09/2011  . Hemorrhoid 10/09/2011  . DJD (degenerative joint disease) 07/10/2011  . Peripheral neuropathy ([place]) 04/17/2011  . Alcohol abuse 01/22/2011  . Annual physical exam 09/26/2010  . FATIGUE 09/14/2008  . ERECTILE DYSFUNCTION 01/26/2008  . Hyperlipidemia 06/11/2006  . Anxiety and depression 06/11/2006  . HTN (hypertension) 06/11/2006  . s/t transmetatarsal amputation due to osteomyelitis 06/11/2006  .  Insomnia 06/11/2006    Bayard Beaver, SPTA 06-19-2015, 9:48 AM  [facility] [address] [place], [place], [address] Phone: [phone]   Fax:  [phone]  Name: EMMA SCHUPP MRN: [id number] Date of Birth: 1941-03-03  This note has been reviewed and edited by supervising CI.  [name], PTA, [facility] [address], [place], [place] [address] [phone] 19-Jun-2015, 2:08 PM    Physical Therapy Progress Note  Dates of Reporting Period: 04/18/2015 to June 19, 2015  Objective Reports of Subjective Statement: Patient reports improved mobility with prosthetic training.   Objective Measurements: see above  Goal Update: see above  Plan: continue established plan of care  Reason Skilled Services are Required: patient requires skilled instruction to progress activity level to full community with prosthesis.       G-Codes - 06-19-2015 0945    Functional Assessment Tool Used Prosthetic care - patient wearing all waking hours removing 2x/day for drying and cleaning. Skin appears in good health - dry, normal temperature, no areas of concern. Patient independent with sock management.   Functional Limitation Self care   Self Care Current Status [phone]) At least 20 percent but less than 40 percent impaired, limited or restricted   Self Care Goal Status (K7183) At least 1 percent but less than 20 percent impaired, limited or restricted      Jamey Reas, PT,  DPT PT Specializing in [place] 05/21/2015 6:42 AM Phone:  [phone]  Fax:  [phone] [facility] [address] [place], [place] [address]

## 2015-05-23 ENCOUNTER — Encounter: Payer: Medicare Other | Admitting: Physical Therapy

## 2015-05-27 ENCOUNTER — Encounter: Payer: Self-pay | Admitting: Physical Therapy

## 2015-05-27 ENCOUNTER — Ambulatory Visit: Payer: Medicare Other | Admitting: Physical Therapy

## 2015-05-27 DIAGNOSIS — R531 Weakness: Secondary | ICD-10-CM

## 2015-05-27 DIAGNOSIS — R269 Unspecified abnormalities of gait and mobility: Secondary | ICD-10-CM

## 2015-05-27 DIAGNOSIS — Z4789 Encounter for other orthopedic aftercare: Secondary | ICD-10-CM

## 2015-05-27 DIAGNOSIS — Z89432 Acquired absence of left foot: Secondary | ICD-10-CM

## 2015-05-27 DIAGNOSIS — R2689 Other abnormalities of gait and mobility: Secondary | ICD-10-CM

## 2015-05-27 DIAGNOSIS — R6889 Other general symptoms and signs: Secondary | ICD-10-CM

## 2015-05-27 DIAGNOSIS — Z89511 Acquired absence of right leg below knee: Secondary | ICD-10-CM

## 2015-05-27 DIAGNOSIS — Z7409 Other reduced mobility: Secondary | ICD-10-CM

## 2015-05-27 DIAGNOSIS — R2681 Unsteadiness on feet: Secondary | ICD-10-CM

## 2015-05-27 DIAGNOSIS — M2560 Stiffness of unspecified joint, not elsewhere classified: Secondary | ICD-10-CM

## 2015-05-27 NOTE — Therapy (Signed)
[facility] [address] [place], [place], [address] Phone: [phone]   Fax:  [phone]  Physical Therapy Treatment  Patient Details  Name: Gene [name] MRN: [id number] Date of Birth: 1940-12-09 Referring Provider: Meridee Score, MD  Encounter Date: 05/27/2015      PT End of Session - 05/27/15 1012    Visit Number 11   Number of Visits 17   Date for PT Re-Evaluation 06/17/15   Authorization Type Medicare G-code & progress note every 10th visit   PT Start Time 0850   PT Stop Time 0930   PT Time Calculation (min) 40 min   Equipment Utilized During Treatment Gait belt   Activity Tolerance Patient tolerated treatment well   Behavior During Therapy [facility] for tasks assessed/performed      Past Medical History  Diagnosis Date  . Anxiety and depression 1995    symptoms started aprox 1995, after a divorce  . Hyperlipidemia     high TG  . Hypertension   . Neuropathy ([place])   . 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 History of alcohol abuse   . Osteomyelitis of ankle and foot ([place]) 10/2013    left  . Family history of anesthesia complication     "father would start seeing things"  . Anxiety   . Arthritis   . Ulcer of esophagus   . Heart murmur     as a baby " outgrew" " small one per Dr Larose Kells.  . PVD (peripheral vascular disease) (HCC)     PVD, mild saw Dr Gwenlyn Found 2012, intolerant to pletal, Rx observation  . History of blood transfusion   . Anemia     Past Surgical History  Procedure Laterality Date  . Cholecystectomy    . Tonsillectomy    . Amputation Left 11/20/13 and 11/24/13    middle  . Amputation Left 01/03/2014    Procedure: Left Transmetatarsal Amputation;  Surgeon: Newt Minion, MD;  Location: [facility];  Service: Orthopedics;  Laterality: Left;  . Transmetatarsal amputation Left 10/2013  . Colonoscopy    . Amputation Right 07/27/2014    Procedure: Right 4th Ray  Amputation;  Surgeon: Newt Minion, MD;  Location: [facility];  Service: Orthopedics;  Laterality: Right;  . Esophagogastroduodenoscopy N/A 08/16/2014    Procedure: ESOPHAGOGASTRODUODENOSCOPY (EGD);  Surgeon: Clarene Essex, MD;  Location: [facility] ENDOSCOPY;  Service: Endoscopy;  Laterality: N/A;  . Esophagogastroduodenoscopy (egd) with propofol N/A 08/17/2014    Procedure: ESOPHAGOGASTRODUODENOSCOPY (EGD) WITH PROPOFOL;  Surgeon: Wonda Horner, MD;  Location: [facility] ENDOSCOPY;  Service: Endoscopy;  Laterality: N/A;  . Toe amputation 4th r  07-27-2014  . Transmetatarsal amputation Right 07/06/02016  . Amputation Right 09/12/2014    Procedure: RIGHT TRANSMETATARSAL AMPUTATION;  Surgeon: Newt Minion, MD;  Location: [facility];  Service: Orthopedics;  Laterality: Right;  . Amputation Right 09/28/2014    Procedure: Right Below Knee Amputation;  Surgeon: Newt Minion, MD;  Location: [facility];  Service: Orthopedics;  Laterality: Right;    There were no vitals filed for this visit.  Visit Diagnosis:  Abnormality of gait  Unsteadiness  Balance problems  Weakness generalized  Encounter for prosthetic gait training  Stiffness due to immobility  Status post below knee amputation of right lower extremity (HCC)  Decreased functional activity tolerance  S/P transmetatarsal amputation of foot, left ([place])      Subjective Assessment - 05/27/15 0347  Subjective wearing prosthesis all day with some sensitivity to distal tibia.    Pertinent History HTN, Neuropathy, hx of alcohol abuse, Ulnar nerve lesions bil. , arthritis, PVD, Left foot Trans Metatarsal amputation 01/03/2014.    Patient Stated Goals He wants to use prosthesis to get around home & community.    Currently in Pain? No/denies                         [facility] Adult PT Treatment/Exercise - 05/27/15 0850    Ambulation/Gait   Ambulation/Gait Yes   Ambulation/Gait Assistance 5: Supervision   Ambulation/Gait Assistance Details cues on proper  step width to decrease abduction, posture, worked on scanning with maintaining path & pace   Ambulation Distance (Feet) 700 Feet  700' with cane, 100' no device   Assistive device Prosthesis;Straight cane   Gait Pattern Step-through pattern;Decreased step length - left;Narrow base of support   Ambulation Surface Indoor;Level;Outdoor;Paved   Stairs Yes   Stairs Assistance 5: Supervision   Stairs Assistance Details (indicate cue type and reason) verbal cues on step width, weight shift & not locking heel against step descending   Stair Management Technique Two rails;One rail Left;One rail Right;With cane;Forwards;Alternating pattern  initially 2 rails, progressed to cane & 1 rail   Number of Stairs 4  8 reps   Ramp 5: Supervision  min guard with cane & prosthesis   Ramp Details (indicate cue type and reason) cues on posture & weight shift   Curb 4: Min assist;5: Supervision  cane & prosthesis   Curb Details (indicate cue type and reason) cues on fluency, step length, weight shift & timing   Exercises   Exercises --   Knee/Hip Exercises: Standing   Heel Raises --   Hip Flexion --   Hip Abduction --   Hip Extension --   Prosthetics   Prosthetic Care Comments  donning liner with pin angled posteriorly to pull soft tissue towards distal tibia. Adjusting ply socks with gait with various ply socks to feel proper pressure: arrived with 1-ply, applied 3-ply, 5-ply (correct today) and 8-ply (too much).    Current prosthetic wear tolerance (days/week)  daily   Current prosthetic wear tolerance (#hours/day)  all awake hours   Residual limb condition  no open areas   Education Provided Skin check;Residual limb care;Correct ply sock adjustment;Proper Donning  see prosthetic care comments   Person(s) Educated Patient   Education Method Explanation;Demonstration;Tactile cues;Verbal cues   Education Method Verbalized understanding;Returned demonstration;Tactile cues required;Verbal cues  required;Needs further instruction                  PT Short Term Goals - 05/14/15 1343    PT SHORT TERM GOAL #1   Title Patient tolerates wear >8 hours total /day without negative changes to current wounds & no new wounds. (Target Date: 05/17/2015)   Baseline MET 05/14/2015   Time 4   Period Weeks   Status Achieved   PT SHORT TERM GOAL #2   Title Patient verbalizes proper cleaning of prosthesis and demonstrates proper donning modified independent. (Target Date: 05/17/2015)   Baseline MET 05/14/2015   Time 4   Period Months   Status Achieved   PT SHORT TERM GOAL #3   Title Patient reaches 5" anteriorly, laterally, across midline & to floor without UE support with supervision. (Target Date: 05/17/2015)   Baseline MET 05/14/2015   Time 4   Period Weeks   Status Achieved  PT SHORT TERM GOAL #4   Title Patient ambulates 300' & negotiates ramps /curbs with RW / prosthesis modified independent. (Target Date: 05/17/2015)   Baseline MET 05/14/2015   Time 4   Period Weeks   Status Achieved   PT SHORT TERM GOAL #5   Title Patient ambulates 100' with single point cane & prosthesis with minimal assist. (Target Date: 05/17/2015)   Baseline MET 05/14/2015   Time 4   Period Weeks   Status Achieved           PT Long Term Goals - 04/29/15 0930    PT LONG TERM GOAL #1   Title Patient is independent in prosthetic care to enable safe use of prosthesis. (Target Date: 06/14/2015)   Time 8   Period Weeks   Status On-going   PT LONG TERM GOAL #2   Title Patient tolerates wear of prosthesis >90% of awake hours without skin intergrity issues or limb tenderness to enable function most of awake hours. (Target Date: 06/14/2015)   Time 8   Period Weeks   Status On-going   PT LONG TERM GOAL #3   Title Berg Balance >45/56 to indicate lower fall risk. (Target Date: 06/14/2015)   Time 8   Period Weeks   Status On-going   PT LONG TERM GOAL #4   Title Patient ambulates 100' carrying laundry basket around  furniture with prosthesis only modified indepenent. (Target Date: 06/14/2015)   Time 8   Period Weeks   Status On-going   PT LONG TERM GOAL #5   Title Patient ambulates 500' including grass, ramps, curbs & stairs (1 rail) with single point cane & prosthesis modified independent. (Target Date: 06/14/2015)   Time 8   Period Weeks   Status On-going               Plan - 05/27/15 1012    Clinical Impression Statement Patient appears to understand how to adjust ply socks better which should improve discomfort at distal tibia and improved gait pattern with less antalgic nature. Patient improved technique on barriers with skilled instruction.    Pt will benefit from skilled therapeutic intervention in order to improve on the following deficits Abnormal gait;Decreased activity tolerance;Decreased balance;Decreased endurance;Decreased knowledge of use of DME;Decreased mobility;Decreased range of motion;Decreased strength;Postural dysfunction;Prosthetic Dependency   Rehab Potential Good   PT Frequency 2x / week   PT Duration 8 weeks   PT Treatment/Interventions ADLs/Self Care Home Management;DME Instruction;Gait training;Stair training;Functional mobility training;Therapeutic activities;Therapeutic exercise;Balance training;Neuromuscular re-education;Patient/family education;Prosthetic Training;Vestibular   PT Next Visit Plan prosthetic gait & balance working towards LTGs. Addres hip/knee weakness. Consider dynamic balance challenges.   Consulted and Agree with Plan of Care Patient        Problem List Patient Active Problem List   Diagnosis Date Noted  . Status post amputation 01/28/2015  . Neuropathy ([place]) 01/28/2015  . PCP NOTES >>>> 01/08/2015  . Below knee amputation status ([place]) 09/28/2014  . Osteomyelitis ([place]) 08/29/2014  . 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 Generalized weakness   . GI bleed 08/15/2014  . Acute blood loss anemia 08/15/2014  . Acute GI bleeding  08/15/2014  . Gastrointestinal hemorrhage with melena   . Prediabetes 07/20/2014  . Heart murmur 05/11/2014  . S/P transmetatarsal amputation of foot ([place]) 01/03/2014  . PVD (peripheral vascular disease) ([place]) 01/20/2013  . Lesion of ulnar nerve 07/22/2012  . Edema 10/09/2011  . Hyperglycemia 10/09/2011  . Hemorrhoid 10/09/2011  . DJD (degenerative joint disease)  07/10/2011  . Peripheral neuropathy ([place]) 04/17/2011  . Alcohol abuse 01/22/2011  . Annual physical exam 09/26/2010  . FATIGUE 09/14/2008  . ERECTILE DYSFUNCTION 01/26/2008  . Hyperlipidemia 06/11/2006  . Anxiety and depression 06/11/2006  . HTN (hypertension) 06/11/2006  . s/t transmetatarsal amputation due to osteomyelitis 06/11/2006  . Insomnia 06/11/2006    Jaslynne Dahan PT, DPT 05/27/2015, 10:15 AM  [facility] [address] [place], [place], [address] Phone: [phone]   Fax:  [phone]  Name: Gene [name] MRN: [id number] Date of Birth: May 31, 1940

## 2015-05-28 ENCOUNTER — Encounter: Payer: Medicare Other | Admitting: Physical Therapy

## 2015-05-30 ENCOUNTER — Encounter: Payer: Medicare Other | Admitting: Physical Therapy

## 2015-06-03 ENCOUNTER — Encounter: Payer: Self-pay | Admitting: Physical Therapy

## 2015-06-03 ENCOUNTER — Ambulatory Visit: Payer: Medicare Other | Admitting: Physical Therapy

## 2015-06-03 DIAGNOSIS — R269 Unspecified abnormalities of gait and mobility: Secondary | ICD-10-CM

## 2015-06-03 DIAGNOSIS — Z4789 Encounter for other orthopedic aftercare: Secondary | ICD-10-CM

## 2015-06-03 DIAGNOSIS — R531 Weakness: Secondary | ICD-10-CM

## 2015-06-03 DIAGNOSIS — Z7409 Other reduced mobility: Secondary | ICD-10-CM

## 2015-06-03 DIAGNOSIS — M2560 Stiffness of unspecified joint, not elsewhere classified: Secondary | ICD-10-CM

## 2015-06-03 DIAGNOSIS — R2681 Unsteadiness on feet: Secondary | ICD-10-CM

## 2015-06-03 DIAGNOSIS — R6889 Other general symptoms and signs: Secondary | ICD-10-CM

## 2015-06-03 DIAGNOSIS — R2689 Other abnormalities of gait and mobility: Secondary | ICD-10-CM

## 2015-06-03 NOTE — Therapy (Signed)
[facility] [address] [place], [place], [address] Phone: [phone]   Fax:  [phone]  Physical Therapy Treatment  Patient Details  Name: Gene [name] MRN: [id number] Date of Birth: 1940/12/29 Referring Provider: Meridee Score, MD  Encounter Date: 06/03/2015      PT End of Session - 06/03/15 1049    Visit Number 12   Number of Visits 17   Date for PT Re-Evaluation 06/17/15   Authorization Type Medicare G-code & progress note every 10th visit   PT Start Time 0846   PT Stop Time 0930   PT Time Calculation (min) 44 min   Equipment Utilized During Treatment Gait belt   Activity Tolerance Patient tolerated treatment well   Behavior During Therapy [facility] for tasks assessed/performed      Past Medical History  Diagnosis Date  . Anxiety and depression 1995    symptoms started aprox 1995, after a divorce  . Hyperlipidemia     high TG  . Hypertension   . Neuropathy ([place])   . 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 History of alcohol abuse   . Osteomyelitis of ankle and foot ([place]) 10/2013    left  . Family history of anesthesia complication     "father would start seeing things"  . Anxiety   . Arthritis   . Ulcer of esophagus   . Heart murmur     as a baby " outgrew" " small one per Dr Larose Kells.  . PVD (peripheral vascular disease) (HCC)     PVD, mild saw Dr Gwenlyn Found 2012, intolerant to pletal, Rx observation  . History of blood transfusion   . Anemia     Past Surgical History  Procedure Laterality Date  . Cholecystectomy    . Tonsillectomy    . Amputation Left 11/20/13 and 11/24/13    middle  . Amputation Left 01/03/2014    Procedure: Left Transmetatarsal Amputation;  Surgeon: Newt Minion, MD;  Location: [facility];  Service: Orthopedics;  Laterality: Left;  . Transmetatarsal amputation Left 10/2013  . Colonoscopy    . Amputation Right 07/27/2014    Procedure: Right 4th Ray  Amputation;  Surgeon: Newt Minion, MD;  Location: [facility];  Service: Orthopedics;  Laterality: Right;  . Esophagogastroduodenoscopy N/A 08/16/2014    Procedure: ESOPHAGOGASTRODUODENOSCOPY (EGD);  Surgeon: Clarene Essex, MD;  Location: [facility] ENDOSCOPY;  Service: Endoscopy;  Laterality: N/A;  . Esophagogastroduodenoscopy (egd) with propofol N/A 08/17/2014    Procedure: ESOPHAGOGASTRODUODENOSCOPY (EGD) WITH PROPOFOL;  Surgeon: Wonda Horner, MD;  Location: [facility] ENDOSCOPY;  Service: Endoscopy;  Laterality: N/A;  . Toe amputation 4th r  07-27-2014  . Transmetatarsal amputation Right 07/06/02016  . Amputation Right 09/12/2014    Procedure: RIGHT TRANSMETATARSAL AMPUTATION;  Surgeon: Newt Minion, MD;  Location: [facility];  Service: Orthopedics;  Laterality: Right;  . Amputation Right 09/28/2014    Procedure: Right Below Knee Amputation;  Surgeon: Newt Minion, MD;  Location: [facility];  Service: Orthopedics;  Laterality: Right;    There were no vitals filed for this visit.  Visit Diagnosis:  Abnormality of gait  Unsteadiness  Weakness generalized  Encounter for prosthetic gait training  Stiffness due to immobility  Decreased functional activity tolerance  Balance problems      Subjective Assessment - 06/03/15 0851    Subjective wearing prosthesis all day with some sensitivity to distal tibia, though pt reports significantly less than previous session.  Pertinent History HTN, Neuropathy, hx of alcohol abuse, Ulnar nerve lesions bil. , arthritis, PVD, Left foot Trans Metatarsal amputation 01/03/2014.    Patient Stated Goals He wants to use prosthesis to get around home & community.    Currently in Pain? No/denies          [facility] Adult PT Treatment/Exercise - 06/03/15 0852    Ambulation/Gait   Ambulation/Gait Yes   Ambulation/Gait Assistance 5: Supervision   Ambulation/Gait Assistance Details cues on step length and weight shift   Ambulation Distance (Feet) 230 Feet  x3, 1x115 feet   Assistive  device Prosthesis   Gait Pattern Step-through pattern;Decreased step length - left;Narrow base of support   Ambulation Surface Level;Indoor   Gait Comments 2 trials of gait with no cane, patient carrying basket. First trial, basket empty. Second trial, basket with 4 weighted balls. First trial x  230', second trial x 115'   Knee/Hip Exercises: Standing   Heel Raises Both;1 set;10 reps   Knee Flexion Strengthening;Both;1 set;10 reps   Hip Flexion Stengthening;Both;1 set;10 reps   Hip Abduction Stengthening;Both;1 set;10 reps   Hip Extension Stengthening;Both;1 set;10 reps   Functional Squat 1 set;5 reps  Patient squat limited by prosthesis obstructing movement.   Prosthetics   Prosthetic Care Comments  Patient verbalizes increased comfort with wearing all awake hours. Donning liner with proper angle.   Current prosthetic wear tolerance (days/week)  daily   Current prosthetic wear tolerance (#hours/day)  all awake hours   Residual limb condition  no open areas   Education Provided Skin check;Residual limb care;Proper Donning   Person(s) Educated Patient   Education Method Explanation;Verbal cues   Education Method Verbalized understanding;Returned demonstration          PT Short Term Goals - 05/14/15 1343    PT SHORT TERM GOAL #1   Title Patient tolerates wear >8 hours total /day without negative changes to current wounds & no new wounds. (Target Date: 05/17/2015)   Baseline MET 05/14/2015   Time 4   Period Weeks   Status Achieved   PT SHORT TERM GOAL #2   Title Patient verbalizes proper cleaning of prosthesis and demonstrates proper donning modified independent. (Target Date: 05/17/2015)   Baseline MET 05/14/2015   Time 4   Period Months   Status Achieved   PT SHORT TERM GOAL #3   Title Patient reaches 5" anteriorly, laterally, across midline & to floor without UE support with supervision. (Target Date: 05/17/2015)   Baseline MET 05/14/2015   Time 4   Period Weeks   Status Achieved    PT SHORT TERM GOAL #4   Title Patient ambulates 300' & negotiates ramps /curbs with RW / prosthesis modified independent. (Target Date: 05/17/2015)   Baseline MET 05/14/2015   Time 4   Period Weeks   Status Achieved   PT SHORT TERM GOAL #5   Title Patient ambulates 100' with single point cane & prosthesis with minimal assist. (Target Date: 05/17/2015)   Baseline MET 05/14/2015   Time 4   Period Weeks   Status Achieved          PT Long Term Goals - 06/03/15 1053    PT LONG TERM GOAL #1   Title Patient is independent in prosthetic care to enable safe use of prosthesis. (Target Date: 06/14/2015)   Time 8   Period Weeks   Status On-going   PT LONG TERM GOAL #2   Title Patient tolerates wear of prosthesis >90% of awake hours  without skin intergrity issues or limb tenderness to enable function most of awake hours. (Target Date: 06/14/2015)   Baseline Goal MET 06/03/15 patient tolerates wear all waking hours without skin integrity issues or limb tenderness, taking off only to shower and clean.   Time 8   Period Weeks   Status Achieved   PT LONG TERM GOAL #3   Title Berg Balance >45/56 to indicate lower fall risk. (Target Date: 06/14/2015)   Time 8   Period Weeks   Status On-going   PT LONG TERM GOAL #4   Title Patient ambulates 100' carrying laundry basket around furniture with prosthesis only modified indepenent. (Target Date: 06/14/2015)   Time 8   Period Weeks   Status On-going   PT LONG TERM GOAL #5   Title Patient ambulates 500' including grass, ramps, curbs & stairs (1 rail) with single point cane & prosthesis modified independent. (Target Date: 06/14/2015)   Time 8   Period Weeks   Status On-going          Plan - 06/03/15 1050    Clinical Impression Statement Skilled session addressing gait without cane and with carrying basket, as well as hip/knee strengthening. Patient able to carry weighted basket with no LOB with no cane. Patient improved prosthetic wear to all waking hours  without skin issues or residual limb pressure/pain. Patient is making steady progress toward goals.   Pt will benefit from skilled therapeutic intervention in order to improve on the following deficits Abnormal gait;Decreased activity tolerance;Decreased balance;Decreased endurance;Decreased knowledge of use of DME;Decreased mobility;Decreased range of motion;Decreased strength;Postural dysfunction;Prosthetic Dependency   Rehab Potential Good   PT Frequency 2x / week   PT Duration 8 weeks   PT Treatment/Interventions ADLs/Self Care Home Management;DME Instruction;Gait training;Stair training;Functional mobility training;Therapeutic activities;Therapeutic exercise;Balance training;Neuromuscular re-education;Patient/family education;Prosthetic Training;Vestibular   PT Next Visit Plan prosthetic gait & balance working towards LTGs. Addres hip/knee weakness. Consider dynamic balance challenges.   Consulted and Agree with Plan of Care Patient        Problem List Patient Active Problem List   Diagnosis Date Noted  . Status post amputation 01/28/2015  . Neuropathy ([place]) 01/28/2015  . PCP NOTES >>>> 01/08/2015  . 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 Osteomyelitis ([place]) 08/29/2014  . 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 Generalized weakness   . GI bleed 08/15/2014  . Acute blood loss anemia 08/15/2014  . Acute GI bleeding 08/15/2014  . Gastrointestinal hemorrhage with melena   . Prediabetes 07/20/2014  . Heart murmur 05/11/2014  . S/P transmetatarsal amputation of foot ([place]) 01/03/2014  . PVD (peripheral vascular disease) ([place]) 01/20/2013  . Lesion of ulnar nerve 07/22/2012  . Edema 10/09/2011  . Hyperglycemia 10/09/2011  . Hemorrhoid 10/09/2011  . DJD (degenerative joint disease) 07/10/2011  . Peripheral neuropathy ([place]) 04/17/2011  . Alcohol abuse 01/22/2011  . Annual physical exam 09/26/2010  . FATIGUE 09/14/2008  . ERECTILE DYSFUNCTION 01/26/2008  .  Hyperlipidemia 06/11/2006  . Anxiety and depression 06/11/2006  . HTN (hypertension) 06/11/2006  . s/t transmetatarsal amputation due to osteomyelitis 06/11/2006  . Insomnia 06/11/2006    Bayard Beaver, SPTA 06/03/2015, 10:59 AM  [facility] [address] [place], [place], [address] Phone: [phone]   Fax:  [phone]  Name: Gene [name] MRN: [id number] Date of Birth: [birth year]  This note has been reviewed and edited by supervising CI.  [name], PTA, [facility] [address]  [place], [place] [address] [phone] 06/03/2015, 11:27 AM

## 2015-06-05 ENCOUNTER — Ambulatory Visit: Payer: Medicare Other | Admitting: Physical Therapy

## 2015-06-05 ENCOUNTER — Encounter: Payer: Self-pay | Admitting: Physical Therapy

## 2015-06-05 DIAGNOSIS — Z4789 Encounter for other orthopedic aftercare: Secondary | ICD-10-CM

## 2015-06-05 DIAGNOSIS — R2689 Other abnormalities of gait and mobility: Secondary | ICD-10-CM

## 2015-06-05 DIAGNOSIS — R269 Unspecified abnormalities of gait and mobility: Secondary | ICD-10-CM | POA: Diagnosis not present

## 2015-06-05 DIAGNOSIS — R6889 Other general symptoms and signs: Secondary | ICD-10-CM

## 2015-06-05 DIAGNOSIS — R531 Weakness: Secondary | ICD-10-CM

## 2015-06-05 DIAGNOSIS — R2681 Unsteadiness on feet: Secondary | ICD-10-CM

## 2015-06-05 DIAGNOSIS — Z89511 Acquired absence of right leg below knee: Secondary | ICD-10-CM

## 2015-06-05 NOTE — Therapy (Signed)
[facility] [address] [place], [place], [address] Phone: [phone]   Fax:  [phone]  Physical Therapy Treatment  Patient Details  Name: Gene [name] MRN: [id number] Date of Birth: [birth year] Referring Provider: Meridee Score, MD  Encounter Date: 06/05/2015      PT End of Session - 06/05/15 1255    Visit Number 13   Number of Visits 17   Date for PT Re-Evaluation 06/17/15   Authorization Type Medicare G-code & progress note every 10th visit   PT Start Time 1015   PT Stop Time 1052  Patient left early to meet appointment with prosthetist.   PT Time Calculation (min) 37 min   Equipment Utilized During Treatment Gait belt   Activity Tolerance Patient tolerated treatment well   Behavior During Therapy [facility] for tasks assessed/performed      Past Medical History  Diagnosis Date  . Anxiety and depression 1995    symptoms started aprox 1995, after a divorce  . Hyperlipidemia     high TG  . Hypertension   . Neuropathy ([place])   . 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 History of alcohol abuse   . Osteomyelitis of ankle and foot ([place]) 10/2013    left  . Family history of anesthesia complication     "father would start seeing things"  . Anxiety   . Arthritis   . Ulcer of esophagus   . Heart murmur     as a baby " outgrew" " small one per Dr Larose Kells.  . PVD (peripheral vascular disease) (HCC)     PVD, mild saw Dr Gwenlyn Found 2012, intolerant to pletal, Rx observation  . History of blood transfusion   . Anemia     Past Surgical History  Procedure Laterality Date  . Cholecystectomy    . Tonsillectomy    . Amputation Left 11/20/13 and 11/24/13    middle  . Amputation Left 01/03/2014    Procedure: Left Transmetatarsal Amputation;  Surgeon: Newt Minion, MD;  Location: [facility];  Service: Orthopedics;  Laterality: Left;  . Transmetatarsal amputation Left 10/2013  . Colonoscopy    .  Amputation Right 07/27/2014    Procedure: Right 4th Ray Amputation;  Surgeon: Newt Minion, MD;  Location: [facility];  Service: Orthopedics;  Laterality: Right;  . Esophagogastroduodenoscopy N/A 08/16/2014    Procedure: ESOPHAGOGASTRODUODENOSCOPY (EGD);  Surgeon: Clarene Essex, MD;  Location: [facility] ENDOSCOPY;  Service: Endoscopy;  Laterality: N/A;  . Esophagogastroduodenoscopy (egd) with propofol N/A 08/17/2014    Procedure: ESOPHAGOGASTRODUODENOSCOPY (EGD) WITH PROPOFOL;  Surgeon: Wonda Horner, MD;  Location: [facility] ENDOSCOPY;  Service: Endoscopy;  Laterality: N/A;  . Toe amputation 4th r  07-27-2014  . Transmetatarsal amputation Right 07/06/02016  . Amputation Right 09/12/2014    Procedure: RIGHT TRANSMETATARSAL AMPUTATION;  Surgeon: Newt Minion, MD;  Location: [facility];  Service: Orthopedics;  Laterality: Right;  . Amputation Right 09/28/2014    Procedure: Right Below Knee Amputation;  Surgeon: Newt Minion, MD;  Location: [facility];  Service: Orthopedics;  Laterality: Right;    There were no vitals filed for this visit.  Visit Diagnosis:  Abnormality of gait  Unsteadiness  Weakness generalized  Encounter for prosthetic gait training  Decreased functional activity tolerance  Balance problems  Status post below knee amputation of right lower extremity (HCC)      Subjective Assessment - 06/05/15 1018    Subjective wearing prosthesis all  day with sensitivity to tibia subsiding. No falls.    Pertinent History HTN, Neuropathy, hx of alcohol abuse, Ulnar nerve lesions bil. , arthritis, PVD, Left foot Trans Metatarsal amputation 01/03/2014.    Patient Stated Goals He wants to use prosthesis to get around home & community.    Currently in Pain? No/denies          [facility] Adult PT Treatment/Exercise - 06/05/15 1027    Ambulation/Gait   Ambulation/Gait Yes   Ambulation/Gait Assistance 5: Supervision;4: Min guard   Ambulation/Gait Assistance Details Cues on weight shift and increased heel strike.    Ambulation Distance (Feet) 500 Feet  x1 outdoor, 115 x 3 indoor without cane carrying basket.   Assistive device Straight cane;Prosthesis   Gait Pattern Step-through pattern;Decreased step length - left;Narrow base of support   Ambulation Surface Level;Unlevel;Indoor;Outdoor;Gravel;Grass   Gait Comments Patient attempted portions of outdoor gait including grass and gravel without cane with no LOB noted with patient verbalizing comfort without cane min/guard (without cane) to S (with cane). 3 trials indoors x 115' each on level surfaces with full laundry basket including around furniture on latter 2 trials min/guard to S. Patient unstable only when picking up basket; balance stabilizes significantly once patient securely carrying basket. Verbal cues for weight shift and increased safety awareness on turns around furniture.   Prosthetics   Prosthetic Care Comments  Patient verbalizes increased comfort with wearing all awake hours. Donning liner with proper angle.   Current prosthetic wear tolerance (days/week)  daily   Current prosthetic wear tolerance (#hours/day)  all awake hours   Residual limb condition  no open areas   Education Provided Residual limb care   Person(s) Educated Patient   Education Method Explanation   Education Method Verbalized understanding   Donning Prosthesis Modified independent (device/increased time)   Doffing Prosthesis Modified independent (device/increased time)          PT Short Term Goals - 05/14/15 1343    PT SHORT TERM GOAL #1   Title Patient tolerates wear >8 hours total /day without negative changes to current wounds & no new wounds. (Target Date: 05/17/2015)   Baseline MET 05/14/2015   Time 4   Period Weeks   Status Achieved   PT SHORT TERM GOAL #2   Title Patient verbalizes proper cleaning of prosthesis and demonstrates proper donning modified independent. (Target Date: 05/17/2015)   Baseline MET 05/14/2015   Time 4   Period Months   Status Achieved    PT SHORT TERM GOAL #3   Title Patient reaches 5" anteriorly, laterally, across midline & to floor without UE support with supervision. (Target Date: 05/17/2015)   Baseline MET 05/14/2015   Time 4   Period Weeks   Status Achieved   PT SHORT TERM GOAL #4   Title Patient ambulates 300' & negotiates ramps /curbs with RW / prosthesis modified independent. (Target Date: 05/17/2015)   Baseline MET 05/14/2015   Time 4   Period Weeks   Status Achieved   PT SHORT TERM GOAL #5   Title Patient ambulates 100' with single point cane & prosthesis with minimal assist. (Target Date: 05/17/2015)   Baseline MET 05/14/2015   Time 4   Period Weeks   Status Achieved          PT Long Term Goals - 06/05/15 1259    PT LONG TERM GOAL #1   Title Patient is independent in prosthetic care to enable safe use of prosthesis. (Target Date: 06/14/2015)  Time 8   Period Weeks   Status On-going   PT LONG TERM GOAL #2   Title Patient tolerates wear of prosthesis >90% of awake hours without skin intergrity issues or limb tenderness to enable function most of awake hours. (Target Date: 06/14/2015)   Baseline Goal MET 06/03/15 patient tolerates wear all waking hours without skin integrity issues or limb tenderness, taking off only to shower and clean.   Time 8   Period Weeks   Status Achieved   PT LONG TERM GOAL #3   Title Berg Balance >45/56 to indicate lower fall risk. (Target Date: 06/14/2015)   Time 8   Period Weeks   Status On-going   PT LONG TERM GOAL #4   Title Patient ambulates 100' carrying laundry basket around furniture with prosthesis only modified indepenent. (Target Date: 06/14/2015)   Baseline 06/05/15 Patient ambulates 115' carrying full laundry basket around furniture with prosthesis only min/guard to supervision.   Time 8   Period Weeks   Status On-going   PT LONG TERM GOAL #5   Title Patient ambulates 500' including grass, ramps, curbs & stairs (1 rail) with single point cane & prosthesis modified  independent. (Target Date: 06/14/2015)   Baseline Goal MET 06/05/15 Patient ambulates 500' including grass, gravel, ramps, curbs, and stairs (1 rail) with single point cane and prosthesis modified independent.   Time 8   Period Weeks   Status Achieved          Plan - 06/05/15 1256    Clinical Impression Statement Skilled session addressing gait on outdoor, unlevel surfaces including gravel and grass with and without cane, indoor gait without cane including carrying full laundry basket, and stairs with single rail without cane. Patient has achieved some goals ahead of schedule and is making rapid progress toward other LTGs.   Pt will benefit from skilled therapeutic intervention in order to improve on the following deficits Abnormal gait;Decreased activity tolerance;Decreased balance;Decreased endurance;Decreased knowledge of use of DME;Decreased mobility;Decreased range of motion;Decreased strength;Postural dysfunction;Prosthetic Dependency   Rehab Potential Good   PT Frequency 2x / week   PT Duration 8 weeks   PT Treatment/Interventions ADLs/Self Care Home Management;DME Instruction;Gait training;Stair training;Functional mobility training;Therapeutic activities;Therapeutic exercise;Balance training;Neuromuscular re-education;Patient/family education;Prosthetic Training;Vestibular   PT Next Visit Plan Work towards [place]. Addres hip/knee weakness. Consider dynamic balance challenges.   Consulted and Agree with Plan of Care Patient      Problem List Patient Active Problem List   Diagnosis Date Noted  . Status post amputation 01/28/2015  . Neuropathy ([place]) 01/28/2015  . PCP NOTES >>>> 01/08/2015  . Below knee amputation status ([place]) 09/28/2014  . Osteomyelitis ([place]) 08/29/2014  . 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 Generalized weakness   . GI bleed 08/15/2014  . Acute blood loss anemia 08/15/2014  . Acute GI bleeding 08/15/2014  . Gastrointestinal hemorrhage with melena   .  Prediabetes 07/20/2014  . Heart murmur 05/11/2014  . S/P transmetatarsal amputation of foot ([place]) 01/03/2014  . PVD (peripheral vascular disease) ([place]) 01/20/2013  . Lesion of ulnar nerve 07/22/2012  . Edema 10/09/2011  . Hyperglycemia 10/09/2011  . Hemorrhoid 10/09/2011  . DJD (degenerative joint disease) 07/10/2011  . Peripheral neuropathy ([place]) 04/17/2011  . Alcohol abuse 01/22/2011  . Annual physical exam 09/26/2010  . FATIGUE 09/14/2008  . ERECTILE DYSFUNCTION 01/26/2008  . Hyperlipidemia 06/11/2006  . Anxiety and depression 06/11/2006  . HTN (hypertension) 06/11/2006  . s/t transmetatarsal amputation due to osteomyelitis 06/11/2006  . Insomnia  06/11/2006    Bayard Beaver, [place] 06/05/2015, 1:02 PM  [facility] [address] [place], [place], [address] Phone: [phone]   Fax:  [phone]  Name: Gene [name] MRN: [id number] Date of Birth: [birth year]  This note has been reviewed and edited by supervising CI.  [name], PTA, [facility] [address], [place], [place] [address] [phone] 06/05/2015, 4:12 PM

## 2015-06-06 ENCOUNTER — Encounter: Payer: Medicare Other | Admitting: Physical Therapy

## 2015-06-07 ENCOUNTER — Ambulatory Visit (INDEPENDENT_AMBULATORY_CARE_PROVIDER_SITE_OTHER): Payer: Medicare Other

## 2015-06-07 ENCOUNTER — Encounter: Payer: Self-pay | Admitting: Podiatry

## 2015-06-07 ENCOUNTER — Ambulatory Visit (INDEPENDENT_AMBULATORY_CARE_PROVIDER_SITE_OTHER): Payer: Medicare Other | Admitting: Podiatry

## 2015-06-07 VITALS — BP 179/47 | HR 66 | Resp 18

## 2015-06-07 DIAGNOSIS — L84 Corns and callosities: Secondary | ICD-10-CM

## 2015-06-07 DIAGNOSIS — R52 Pain, unspecified: Secondary | ICD-10-CM

## 2015-06-07 DIAGNOSIS — G629 Polyneuropathy, unspecified: Secondary | ICD-10-CM | POA: Diagnosis not present

## 2015-06-07 NOTE — Progress Notes (Signed)
Patient ID: Gene [name], male   DOB: May 22, 1940, 75 y.o.   MRN: [id number]   Subjective: 75 year old male presents the office today for diabetic risk assessment. He states the only issues and he presses the clutch on his car he gets some pain to the agitation stump of the left foot. Denies any open sores. He hasa callus form. Denies any drainage or redness or swelling. No other complaints at this time. Denies any systemic complaints such as fevers, chills, nausea, vomiting. No calf pain, chest pain, shortness of breath.  Objective: AAO x 3, NAD DP/PT pulses palpable on the left side. There is an immediate capillary refill time to the flap of the transmetatarsal amputation. 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 Hyperkeratotic lesion to the distal aspect of the TMA site on the second metatarsal stump. Upon debridement no underlying ulceration, drainage or other signs of infection. There are no other open lesions or pre-ulcerative lesions identified at this time. No other areas of tenderness to bilateral lower extremities. There is no pain with calf compression, swelling, warmth, erythema.  Assessment: 75 year old male with left transmetatarsal amputation with pre-ulceration lesion  Plan: -Treatment options discussed including all alternatives, risks, and complications -X-rays were obtained and reviewed with the patient. Amputation site resection margins are sharp.  -Etiology of symptoms were discussed -Pre-ulcerative lesion debrided without complications or bleeding. Monitor any skin breakdown. -Follow up in 3 months or sooner if any issues are to arise. Call any questions or concerns meantime.  Ovid CurdMatthew Wagoner, DPM

## 2015-06-10 ENCOUNTER — Encounter: Payer: Medicare Other | Admitting: Physical Therapy

## 2015-06-11 ENCOUNTER — Encounter: Payer: Self-pay | Admitting: Physical Therapy

## 2015-06-11 ENCOUNTER — Ambulatory Visit: Payer: Medicare Other | Attending: Orthopedic Surgery | Admitting: Physical Therapy

## 2015-06-11 DIAGNOSIS — R29818 Other symptoms and signs involving the nervous system: Secondary | ICD-10-CM | POA: Diagnosis not present

## 2015-06-11 DIAGNOSIS — R2681 Unsteadiness on feet: Secondary | ICD-10-CM | POA: Diagnosis present

## 2015-06-11 DIAGNOSIS — Z5189 Encounter for other specified aftercare: Secondary | ICD-10-CM | POA: Diagnosis not present

## 2015-06-11 DIAGNOSIS — R6889 Other general symptoms and signs: Secondary | ICD-10-CM | POA: Diagnosis not present

## 2015-06-11 DIAGNOSIS — R531 Weakness: Secondary | ICD-10-CM | POA: Insufficient documentation

## 2015-06-11 DIAGNOSIS — R269 Unspecified abnormalities of gait and mobility: Secondary | ICD-10-CM | POA: Diagnosis not present

## 2015-06-11 DIAGNOSIS — R2689 Other abnormalities of gait and mobility: Secondary | ICD-10-CM | POA: Diagnosis present

## 2015-06-11 DIAGNOSIS — Z89511 Acquired absence of right leg below knee: Secondary | ICD-10-CM | POA: Insufficient documentation

## 2015-06-11 DIAGNOSIS — M6281 Muscle weakness (generalized): Secondary | ICD-10-CM | POA: Insufficient documentation

## 2015-06-11 NOTE — Therapy (Signed)
[facility] [address] [place], [place], [address] Phone: [phone]   Fax:  [phone]  Physical Therapy Treatment  Patient Details  Name: Gene [name] MRN: [id number] Date of Birth: 02/18/1941 Referring Provider: Marcus Duda, MD  Encounter Date: 06/11/2015      PT End of Session - 06/11/15 0938    Visit Number 14   Number of Visits 17   Date for PT Re-Evaluation 06/17/15   Authorization Type Medicare G-code & progress note every 10th visit   PT Start Time 0934   PT Stop Time 1014   PT Time Calculation (min) 40 min   Equipment Utilized During Treatment Gait belt   Activity Tolerance Patient tolerated treatment well   Behavior During Therapy WFL for tasks assessed/performed      Past Medical History  Diagnosis Date  . Anxiety and depression 1995    symptoms started aprox 1995, after a divorce  . Hyperlipidemia     high TG  . Hypertension   . Neuropathy (HCC)   . 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 History of alcohol abuse   . 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 Family history of anesthesia complication     "father would start seeing things"  . Anxiety   . Arthritis   . Ulcer of esophagus   . Heart murmur     as a baby " outgrew" " small one per Dr Paz.  . PVD (peripheral vascular disease) (HCC)     PVD, mild saw Dr Berry 2012, intolerant to pletal, Rx observation  . History of blood transfusion   . Anemia     Past Surgical History  Procedure Laterality Date  . Cholecystectomy    . Tonsillectomy    . Amputation Left 11/20/13 and 11/24/13    middle  . Amputation Left 01/03/2014    Procedure: Left Transmetatarsal Amputation;  Surgeon: Marcus Duda V, MD;  Location: [facility] OR;  Service: Orthopedics;  Laterality: Left;  . Transmetatarsal amputation Left 10/2013  . Colonoscopy    . Amputation Right 07/27/2014    Procedure: Right 4th Ray  Amputation;  Surgeon: Marcus Duda V, MD;  Location: [facility] OR;  Service: Orthopedics;  Laterality: Right;  . Esophagogastroduodenoscopy N/A 08/16/2014    Procedure: ESOPHAGOGASTRODUODENOSCOPY (EGD);  Surgeon: Marc Magod, MD;  Location: [facility];  Service: Endoscopy;  Laterality: N/A;  . Esophagogastroduodenoscopy (egd) with propofol N/A 08/17/2014    Procedure: ESOPHAGOGASTRODUODENOSCOPY (EGD) WITH PROPOFOL;  Surgeon: Salem F Ganem, MD;  Location: [facility];  Service: Endoscopy;  Laterality: N/A;  . Toe amputation 4th r  07-27-2014  . Transmetatarsal amputation Right 07/06/02016  . Amputation Right 09/12/2014    Procedure: RIGHT TRANSMETATARSAL AMPUTATION;  Surgeon: Marcus Duda V, MD;  Location: [facility] OR;  Service: Orthopedics;  Laterality: Right;  . Amputation Right 09/28/2014    Procedure: Right Below Knee Amputation;  Surgeon: Marcus Duda V, MD;  Location: [facility] OR;  Service: Orthopedics;  Laterality: Right;    There were no vitals filed for this visit.  Visit Diagnosis:  Abnormality of gait  Unsteadiness  Weakness generalized  Encounter for prosthetic gait training  Decreased functional activity tolerance  Balance problems  Status post below knee amputation of right lower extremity (HCC)      Subjective Assessment - 06/11/15 0937    Subjective No new complaints. No falls to report. Does report no change in   back pain, hurts only when he bends over.   Pertinent History HTN, Neuropathy, hx of alcohol abuse, Ulnar nerve lesions bil. , arthritis, PVD, Left foot Trans Metatarsal amputation 01/03/2014.    Patient Stated Goals He wants to use prosthesis to get around home & community.    Currently in Pain? No/denies   Pain Score 0-No pain            [facility] PT Assessment - 06/11/15 0939    Berg Balance Test   Sit to Stand Able to stand  independently using hands   Standing Unsupported Able to stand safely 2 minutes   Sitting with Back Unsupported but Feet Supported on Floor or Stool  Able to sit safely and securely 2 minutes   Stand to Sit Controls descent by using hands   Transfers Able to transfer safely, definite need of hands   Standing Unsupported with Eyes Closed Able to stand 10 seconds safely   Standing Ubsupported with Feet Together Able to place feet together independently and stand for 1 minute with supervision   From Standing, Reach Forward with Outstretched Arm Can reach confidently >25 cm (10")   From Standing Position, Pick up Object from Floor Able to pick up shoe safely and easily  after educ. on prosthetic placement w/ lifting from floor   From Standing Position, Turn to Look Behind Over each Shoulder Looks behind one side only/other side shows less weight shift  left > right   Turn 360 Degrees Needs close supervision or verbal cueing  > 7 seconds   Standing Unsupported, Alternately Place Feet on Step/Stool Able to complete >2 steps/needs minimal assist   Standing Unsupported, One Foot in Front Able to take small step independently and hold 30 seconds   Standing on One Leg Tries to lift leg/unable to hold 3 seconds but remains standing independently   Total Score 40            [facility] Adult PT Treatment/Exercise - 06/11/15 0939    Transfers   Transfers Sit to Stand;Stand to Sit   Ambulation/Gait   Ambulation/Gait Yes   Ambulation/Gait Assistance 6: Modified independent (Device/Increase time)   Ambulation/Gait Assistance Details no balance loss noted with use of cane on indoor or outdoor surfaces.                                          Ambulation Distance (Feet) 500 Feet  x1 indoor   Assistive device Straight cane;Prosthesis   Gait Pattern Step-through pattern;Decreased step length - left;Narrow base of support   Ambulation Surface Level;Unlevel;Indoor;Outdoor;Paved;Gravel;Grass   Stairs Yes   Stairs Assistance 6: Modified independent (Device/Increase time)   Stairs Assistance Details (indicate cue type and reason) 1 rep with each rail/cane  combo, no cues or assistance needed   Stair Management Technique One rail Left;One rail Right;Step to pattern;Forwards;With cane   Number of Stairs 4  x 2 reps   Ramp 6: Modified independent (Device)  with cane/prosthesis   Curb 6: Modified independent (Device/increase time)  with cane/prosthesis   Prosthetics   Prosthetic Care Comments  reinforced use of antipersperant and baby oil to control heat rash and sweating.    Current prosthetic wear tolerance (days/week)  daily   Current prosthetic wear tolerance (#hours/day)  all awake hours   Residual limb condition  intact per pt report. Had some heat rash under thigh, applied the  barrier sock and its gone.                               Education Provided Residual limb care   Person(s) Educated Patient   Education Method Explanation;Verbal cues   Education Method Verbalized understanding;Verbal cues required   Donning Prosthesis Modified independent (device/increased time)   Doffing Prosthesis Modified independent (device/increased time)            PT Short Term Goals - 05/14/15 1343    PT SHORT TERM GOAL #1   Title Patient tolerates wear >8 hours total /day without negative changes to current wounds & no new wounds. (Target Date: 05/17/2015)   Baseline MET 05/14/2015   Time 4   Period Weeks   Status Achieved   PT SHORT TERM GOAL #2   Title Patient verbalizes proper cleaning of prosthesis and demonstrates proper donning modified independent. (Target Date: 05/17/2015)   Baseline MET 05/14/2015   Time 4   Period Months   Status Achieved   PT SHORT TERM GOAL #3   Title Patient reaches 5" anteriorly, laterally, across midline & to floor without UE support with supervision. (Target Date: 05/17/2015)   Baseline MET 05/14/2015   Time 4   Period Weeks   Status Achieved   PT SHORT TERM GOAL #4   Title Patient ambulates 300' & negotiates ramps /curbs with RW / prosthesis modified independent. (Target Date: 05/17/2015)   Baseline MET 05/14/2015    Time 4   Period Weeks   Status Achieved   PT SHORT TERM GOAL #5   Title Patient ambulates 100' with single point cane & prosthesis with minimal assist. (Target Date: 05/17/2015)   Baseline MET 05/14/2015   Time 4   Period Weeks   Status Achieved           PT Long Term Goals - 06/11/15 0938    PT LONG TERM GOAL #1   Title Patient is independent in prosthetic care to enable safe use of prosthesis. (Target Date: 06/14/2015)   Baseline met on 06/11/15   Time --   Period --   Status Achieved   PT LONG TERM GOAL #2   Title Patient tolerates wear of prosthesis >90% of awake hours without skin intergrity issues or limb tenderness to enable function most of awake hours. (Target Date: 06/14/2015)   Baseline met on 06/11/15   Time --   Period --   Status Achieved   PT LONG TERM GOAL #3   Title Berg Balance >45/56 to indicate lower fall risk. (Target Date: 06/14/2015)   Baseline 06/11/15: scored 40/56   Time 8   Period Weeks   Status On-going   PT LONG TERM GOAL #4   Title Patient ambulates 100' carrying laundry basket around furniture with prosthesis only modified indepenent. (Target Date: 06/14/2015)   Baseline 06/05/15 Patient ambulates 115' carrying full laundry basket around furniture with prosthesis only min/guard to supervision.   Time 8   Period Weeks   Status On-going   PT LONG TERM GOAL #5   Title Patient ambulates 500' including grass, ramps, curbs & stairs (1 rail) with single point cane & prosthesis modified independent. (Target Date: 06/14/2015)   Baseline Goal MET 06/05/15 Patient ambulates 500' including grass, gravel, ramps, curbs, and stairs (1 rail) with single point cane and prosthesis modified independent.   Time --   Period --   Status Achieved              Plan - 06/11/15 1610    Clinical Impression Statement Pt has met 3/5 LTGs as of today and is progressing towards other. Pt scored 40/56 on Berg Balance Test, not quite to goal however improved from evaluation.    Pt  will benefit from skilled therapeutic intervention in order to improve on the following deficits Abnormal gait;Decreased activity tolerance;Decreased balance;Decreased endurance;Decreased knowledge of use of DME;Decreased mobility;Decreased range of motion;Decreased strength;Postural dysfunction;Prosthetic Dependency   Rehab Potential Good   PT Frequency 2x / week   PT Duration 8 weeks   PT Treatment/Interventions ADLs/Self Care Home Management;DME Instruction;Gait training;Stair training;Functional mobility training;Therapeutic activities;Therapeutic exercise;Balance training;Neuromuscular re-education;Patient/family education;Prosthetic Training;Vestibular   PT Next Visit Plan assess remaining goals. Recheck lower aspects of Berg due to performing at end of today's session when pt was fatigued to see if score improves.   Consulted and Agree with Plan of Care Patient        Problem List Patient Active Problem List   Diagnosis Date Noted  . Pre-ulcerative calluses 06/07/2015  . Status post amputation 01/28/2015  . Neuropathy ([place]) 01/28/2015  . PCP NOTES >>>> 01/08/2015  . Below knee amputation status ([place]) 09/28/2014  . Osteomyelitis ([employer]) 08/29/2014  . 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 Generalized weakness   . GI bleed 08/15/2014  . Acute blood loss anemia 08/15/2014  . Acute GI bleeding 08/15/2014  . Gastrointestinal hemorrhage with melena   . Prediabetes 07/20/2014  . Heart murmur 05/11/2014  . S/P transmetatarsal amputation of foot ([place]) 01/03/2014  . PVD (peripheral vascular disease) ([place]) 01/20/2013  . Lesion of ulnar nerve 07/22/2012  . Edema 10/09/2011  . Hyperglycemia 10/09/2011  . Hemorrhoid 10/09/2011  . DJD (degenerative joint disease) 07/10/2011  . Peripheral neuropathy ([place]) 04/17/2011  . Alcohol abuse 01/22/2011  . Annual physical exam 09/26/2010  . FATIGUE 09/14/2008  . ERECTILE DYSFUNCTION 01/26/2008  . Hyperlipidemia 06/11/2006  . Anxiety and  depression 06/11/2006  . HTN (hypertension) 06/11/2006  . s/t transmetatarsal amputation due to osteomyelitis 06/11/2006  . Insomnia 06/11/2006   [name], PTA, [facility] [address] [place], [place] [address] [phone] 06/11/2015, 12:31 PM   Name: TAGGART PRASAD MRN: [id number] Date of Birth: 1941/02/03

## 2015-06-13 ENCOUNTER — Ambulatory Visit: Payer: Medicare Other | Admitting: Physical Therapy

## 2015-06-13 DIAGNOSIS — R2689 Other abnormalities of gait and mobility: Secondary | ICD-10-CM | POA: Diagnosis not present

## 2015-06-13 DIAGNOSIS — M6281 Muscle weakness (generalized): Secondary | ICD-10-CM

## 2015-06-13 DIAGNOSIS — R2681 Unsteadiness on feet: Secondary | ICD-10-CM

## 2015-06-14 NOTE — Addendum Note (Signed)
Addended by: Fraser DinWALDRON, Ikhlas Albo M on: 06/14/2015 08:54 AM   Modules accepted: Orders

## 2015-06-14 NOTE — Therapy (Signed)
[facility] [address] [place], [place], [address] Phone: [phone]   Fax:  [phone]  Physical Therapy Treatment  Patient Details  Name: Gene [name] MRN: [id number] Date of Birth: 17-Nov-1940 Referring Provider: Meridee Score, MD  Encounter Date: 06/13/2015      PT End of Session - 06/13/15 0930    Visit Number 15   Number of Visits 17   Date for PT Re-Evaluation 06/17/15   Authorization Type Medicare G-code & progress note every 10th visit   PT Start Time 0846   PT Stop Time 0930   PT Time Calculation (min) 44 min   Equipment Utilized During Treatment Gait belt   Activity Tolerance Patient tolerated treatment well   Behavior During Therapy [facility] for tasks assessed/performed      Past Medical History  Diagnosis Date  . Anxiety and depression 1995    symptoms started aprox 1995, after a divorce  . Hyperlipidemia     high TG  . Hypertension   . Neuropathy ([place])   . 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 History of alcohol abuse   . Osteomyelitis of ankle and foot ([place]) 10/2013    left  . Family history of anesthesia complication     "father would start seeing things"  . Anxiety   . Arthritis   . Ulcer of esophagus   . Heart murmur     as a baby " outgrew" " small one per Dr Larose Kells.  . PVD (peripheral vascular disease) (H[facility])     PVD, mild saw Dr Gwenlyn Found 2012, intolerant to pletal, Rx observation  . History of blood transfusion   . Anemia     Past Surgical History  Procedure Laterality Date  . Cholecystectomy    . Tonsillectomy    . Amputation Left 11/20/13 and 11/24/13    middle  . Amputation Left 01/03/2014    Procedure: Left Transmetatarsal Amputation;  Surgeon: Newt Minion, MD;  Location: [place];  Service: Orthopedics;  Laterality: Left;  . Transmetatarsal amputation Left 10/2013  . Colonoscopy    . Amputation Right 07/27/2014    Procedure: Right 4th Ray  Amputation;  Surgeon: Newt Minion, MD;  Location: [place];  Service: Orthopedics;  Laterality: Right;  . Esophagogastroduodenoscopy N/A 08/16/2014    Procedure: ESOPHAGOGASTRODUODENOSCOPY (EGD);  Surgeon: Clarene Essex, MD;  Location: [facility] ENDOSCOPY;  Service: Endoscopy;  Laterality: N/A;  . Esophagogastroduodenoscopy (egd) with propofol N/A 08/17/2014    Procedure: ESOPHAGOGASTRODUODENOSCOPY (EGD) WITH PROPOFOL;  Surgeon: Wonda Horner, MD;  Location: [facility] ENDOSCOPY;  Service: Endoscopy;  Laterality: N/A;  . Toe amputation 4th r  07-27-2014  . Transmetatarsal amputation Right 07/06/02016  . Amputation Right 09/12/2014    Procedure: RIGHT TRANSMETATARSAL AMPUTATION;  Surgeon: Newt Minion, MD;  Location: [place];  Service: Orthopedics;  Laterality: Right;  . Amputation Right 09/28/2014    Procedure: Right Below Knee Amputation;  Surgeon: Newt Minion, MD;  Location: [place];  Service: Orthopedics;  Laterality: Right;    There were no vitals filed for this visit.      Subjective Assessment - 06/13/15 0854    Subjective No new issues or falls.    Pertinent History HTN, Neuropathy, hx of alcohol abuse, Ulnar nerve lesions bil. , arthritis, PVD, Left foot Trans Metatarsal amputation 01/03/2014.    Patient Stated Goals He wants to use prosthesis to get around home & community.  Currently in Pain? No/denies            [facility] PT Assessment - 06/13/15 0930    Observation/Other Assessments   Focus on Therapeutic Outcomes (FOTO)  71.55 Functional Status  initial was 41.45% FS   Fear Avoidance Belief Questionnaire (FABQ)  26 (8)  initial 33 (10)   Berg Balance Test   Sit to Stand Able to stand  independently using hands   Standing Unsupported Able to stand safely 2 minutes   Sitting with Back Unsupported but Feet Supported on Floor or Stool Able to sit safely and securely 2 minutes   Stand to Sit Sits safely with minimal use of hands   Transfers Able to transfer safely, minor use of hands   Standing  Unsupported with Eyes Closed Able to stand 10 seconds safely   Standing Ubsupported with Feet Together Able to place feet together independently and stand 1 minute safely   From Standing, Reach Forward with Outstretched Arm Can reach confidently >25 cm (10")   From Standing Position, Pick up Object from Floor Able to pick up shoe safely and easily   From Standing Position, Turn to Look Behind Over each Shoulder Looks behind one side only/other side shows less weight shift   Turn 360 Degrees Able to turn 360 degrees safely but slowly   Standing Unsupported, Alternately Place Feet on Step/Stool Able to stand independently and safely and complete 8 steps in 20 seconds   Standing Unsupported, One Foot in Front Able to take small step independently and hold 30 seconds   Standing on One Leg Tries to lift leg/unable to hold 3 seconds but remains standing independently   Total Score 47         Prosthetics Assessment - 06/13/15 0930    Prosthetics   Prosthetic Care Independent with Skin check;Residual limb care;Care of non-amputated limb;Prosthetic cleaning;Ply sock cleaning;Correct ply sock adjustment;Proper wear schedule/adjustment;Proper weight-bearing schedule/adjustment                    [facility] Adult PT Treatment/Exercise - 06/13/15 0930    Transfers   Transfers Sit to Stand;Stand to Sit   Sit to Stand 6: Modified independent (Device/Increase time);With upper extremity assist;From chair/3-in-1   Stand to Sit 6: Modified independent (Device/Increase time);With upper extremity assist;To chair/3-in-1   Ambulation/Gait   Ambulation/Gait Yes   Ambulation/Gait Assistance 6: Modified independent (Device/Increase time)   Ambulation/Gait Assistance Details trialed Toe-Off AFO on right Transmetatarsal Amputation with discussion vs only steel plate, PT instructed how to pursue AFO if feels he would benefit (catching foot in swing & increased instability)   Ambulation Distance (Feet) 500 Feet   x1 indoor   Assistive device Straight cane;Prosthesis   Gait Pattern Step-through pattern;Decreased step length - left;Narrow base of support   Stairs Yes   Stairs Assistance 6: Modified independent (Device/Increase time)   Stair Management Technique One rail Left;One rail Right;Step to pattern;Forwards;With cane   Number of Stairs 4  x 2 reps   Ramp 6: Modified independent (Device)  with cane/prosthesis   Curb 6: Modified independent (Device/increase time)  with cane/prosthesis   Gait Comments Patient able to carry laundry basket 100' around furniture   Prosthetics   Prosthetic Care Comments  --   Current prosthetic wear tolerance (days/week)  daily   Current prosthetic wear tolerance (#hours/day)  all awake hours   Residual limb condition  intact   Education Provided --  PT Education - 06/13/15 0930    Education provided Yes   Education Details squat over chair for strength, at counter: alternate stepping and modified single leg stance with one foot on lower shelf   Person(s) Educated Patient   Methods Explanation;Demonstration;Verbal cues   Comprehension Verbalized understanding;Returned demonstration          PT Short Term Goals - 05/14/15 1343    PT SHORT TERM GOAL #1   Title Patient tolerates wear >8 hours total /day without negative changes to current wounds & no new wounds. (Target Date: 05/17/2015)   Baseline MET 05/14/2015   Time 4   Period Weeks   Status Achieved   PT SHORT TERM GOAL #2   Title Patient verbalizes proper cleaning of prosthesis and demonstrates proper donning modified independent. (Target Date: 05/17/2015)   Baseline MET 05/14/2015   Time 4   Period Months   Status Achieved   PT SHORT TERM GOAL #3   Title Patient reaches 5" anteriorly, laterally, across midline & to floor without UE support with supervision. (Target Date: 05/17/2015)   Baseline MET 05/14/2015   Time 4   Period Weeks   Status Achieved   PT SHORT TERM GOAL #4    Title Patient ambulates 300' & negotiates ramps /curbs with RW / prosthesis modified independent. (Target Date: 05/17/2015)   Baseline MET 05/14/2015   Time 4   Period Weeks   Status Achieved   PT SHORT TERM GOAL #5   Title Patient ambulates 100' with single point cane & prosthesis with minimal assist. (Target Date: 05/17/2015)   Baseline MET 05/14/2015   Time 4   Period Weeks   Status Achieved           PT Long Term Goals - 06/13/15 0930    PT LONG TERM GOAL #1   Title Patient is independent in prosthetic care to enable safe use of prosthesis. (Target Date: 06/14/2015)   Baseline met on 06/11/15   Status Achieved   PT LONG TERM GOAL #2   Title Patient tolerates wear of prosthesis >90% of awake hours without skin intergrity issues or limb tenderness to enable function most of awake hours. (Target Date: 06/14/2015)   Baseline met on 06/11/15   Status Achieved   PT LONG TERM GOAL #3   Title Berg Balance >45/56 to indicate lower fall risk. (Target Date: 06/14/2015)   Baseline MET 06/13/2015 Berg Balance 47 /56   Time 8   Period Weeks   Status Achieved   PT LONG TERM GOAL #4   Title Patient ambulates 100' carrying laundry basket around furniture with prosthesis only modified indepenent. (Target Date: 06/14/2015)   Baseline MET 06/13/2015   Time 8   Period Weeks   Status Achieved   PT LONG TERM GOAL #5   Title Patient ambulates 500' including grass, ramps, curbs & stairs (1 rail) with single point cane & prosthesis modified independent. (Target Date: 06/14/2015)   Baseline Goal MET 06/05/15 Patient ambulates 500' including grass, gravel, ramps, curbs, and stairs (1 rail) with single point cane and prosthesis modified independent.   Status Achieved               Plan - 06/13/15 0930    Clinical Impression Statement Patient met all LTGs. Patient verbalizes understanding how AFO will assist gait & balance compared to steel plate only. Patient reports pleased with progress & mobility.    Rehab  Potential Good   PT Frequency 2x / week  PT Duration 8 weeks   PT Treatment/Interventions ADLs/Self Care Home Management;DME Instruction;Gait training;Stair training;Functional mobility training;Therapeutic activities;Therapeutic exercise;Balance training;Neuromuscular re-education;Patient/family education;Prosthetic Training;Vestibular   PT Next Visit Plan discharge   Consulted and Agree with Plan of Care Patient      Patient will benefit from skilled therapeutic intervention in order to improve the following deficits and impairments:  Abnormal gait, Decreased activity tolerance, Decreased balance, Decreased endurance, Decreased knowledge of use of DME, Decreased mobility, Decreased range of motion, Decreased strength, Postural dysfunction, Prosthetic Dependency  Visit Diagnosis: Other abnormalities of gait and mobility  Unsteadiness on feet  Muscle weakness (generalized)       G-Codes - 07/03/2015 0930    Functional Assessment Tool Used Independent in prosthetic care. Wears prosthesis >90% of awake hours without issues.    Functional Limitation Self care   Self Care Goal Status [phone]) At least 1 percent but less than 20 percent impaired, limited or restricted   Self Care Discharge Status [phone]) At least 1 percent but less than 20 percent impaired, limited or restricted      Problem List Patient Active Problem List   Diagnosis Date Noted  . Pre-ulcerative calluses 06/07/2015  . Status post amputation 01/28/2015  . Neuropathy ([place]) 01/28/2015  . PCP NOTES >>>> 01/08/2015  . Below knee amputation status ([place]) 09/28/2014  . Osteomyelitis ([place]) 08/29/2014  . 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 Generalized weakness   . GI bleed 08/15/2014  . Acute blood loss anemia 08/15/2014  . Acute GI bleeding 08/15/2014  . Gastrointestinal hemorrhage with melena   . Prediabetes 07/20/2014  . Heart murmur 05/11/2014  . S/P transmetatarsal amputation of foot ([place]) 01/03/2014  .  PVD (peripheral vascular disease) ([place]) 01/20/2013  . Lesion of ulnar nerve 07/22/2012  . Edema 10/09/2011  . Hyperglycemia 10/09/2011  . Hemorrhoid 10/09/2011  . DJD (degenerative joint disease) 07/10/2011  . Peripheral neuropathy ([place]) 04/17/2011  . Alcohol abuse 01/22/2011  . Annual physical exam 09/26/2010  . FATIGUE 09/14/2008  . ERECTILE DYSFUNCTION 01/26/2008  . Hyperlipidemia 06/11/2006  . Anxiety and depression 06/11/2006  . HTN (hypertension) 06/11/2006  . s/t transmetatarsal amputation due to osteomyelitis 06/11/2006  . Insomnia 06/11/2006    Lucyle Alumbaugh PT, DPT 06/14/2015, 9:20 AM  [facility] [address] [place], [place], [address] Phone: [phone]   Fax:  [phone]  Name: Gene [name] MRN: [id number] Date of Birth: April 25, 1940  PHYSICAL THERAPY DISCHARGE SUMMARY  Visits from Start of Care: 15  Current functional level related to goals / functional outcomes: See above   Remaining deficits: See above   Education / Equipment: Prosthetic care & HEP Plan: Patient agrees to discharge.  Patient goals were met. Patient is being discharged due to meeting the stated rehab goals.  ?????

## 2015-07-08 ENCOUNTER — Other Ambulatory Visit: Payer: Self-pay | Admitting: Internal Medicine

## 2015-07-08 ENCOUNTER — Encounter: Payer: Self-pay | Admitting: Internal Medicine

## 2015-07-08 NOTE — Telephone Encounter (Signed)
Last OV: 02/08/15 Last filled: 05/08/15, #90, 0 RF Sig: Take 1 tablet (0.5 mg total) by mouth 3 (three) times daily UDS: 01/20/13, low risk

## 2015-07-08 NOTE — Telephone Encounter (Signed)
Can be reached: [phone] Pharmacy: [id number] - [place], [place] - [phone]  Reason for call: Pt needing refill on alprazolam. He has 1 pill left for tonight. He states the pharmacy sent a request and had not heard back.

## 2015-07-09 MED ORDER — ALPRAZOLAM 0.5 MG PO TABS
0.5000 mg | ORAL_TABLET | Freq: Three times a day (TID) | ORAL | Status: DC
Start: 2015-07-09 — End: 2015-09-05

## 2015-07-09 NOTE — Telephone Encounter (Signed)
Patient calling on the status of message below.

## 2015-07-09 NOTE — Telephone Encounter (Signed)
Rx printed, to PCP for signature. 

## 2015-07-09 NOTE — Telephone Encounter (Signed)
Rx faxed to pharmacy by CMA, Penny PiaAngie Baynes. MyChart message sent to pt notifying him of rx and to schedule follow up appt.

## 2015-07-09 NOTE — Telephone Encounter (Signed)
Okay #90, no refills. Also schedule a routine office visit at his earliest convenience

## 2015-07-09 NOTE — Telephone Encounter (Signed)
To PCP for review. Please advise

## 2015-07-10 ENCOUNTER — Telehealth: Payer: Self-pay | Admitting: *Deleted

## 2015-07-10 NOTE — Telephone Encounter (Signed)
-----   Message from Audelia Actonenise L Smith sent at 07/09/2015  2:58 PM EDT ----- Regarding: Call Patient Contact: [phone] Patient wants you to call him and tell him how long in advance he can call for a refill. I answered his question but it was not sufficient.

## 2015-07-10 NOTE — Telephone Encounter (Signed)
[facility]  @ 8:06am @ [phone]) asking the pt to RTC regarding the message below.//AB/CMA

## 2015-07-11 NOTE — Telephone Encounter (Signed)
Called left message to call back 

## 2015-07-16 NOTE — Telephone Encounter (Signed)
[facility] @ 11:31am @ [phone]) asking the pt to RTC regarding the note below.//AB/CMA

## 2015-07-17 NOTE — Telephone Encounter (Signed)
Tried on several attempts to reach the pt regarding the message below.  Leave messages asking the pt to get us a call back,but have not heard back from the pt.//AB/CMA

## 2015-07-19 ENCOUNTER — Encounter: Payer: Self-pay | Admitting: Cardiovascular Disease

## 2015-07-19 ENCOUNTER — Ambulatory Visit (INDEPENDENT_AMBULATORY_CARE_PROVIDER_SITE_OTHER): Payer: Medicare Other | Admitting: Cardiovascular Disease

## 2015-07-19 VITALS — BP 140/76 | HR 58 | Ht 69.0 in | Wt 189.6 lb

## 2015-07-19 DIAGNOSIS — I10 Essential (primary) hypertension: Secondary | ICD-10-CM | POA: Diagnosis not present

## 2015-07-19 DIAGNOSIS — E785 Hyperlipidemia, unspecified: Secondary | ICD-10-CM

## 2015-07-19 DIAGNOSIS — R0989 Other specified symptoms and signs involving the circulatory and respiratory systems: Secondary | ICD-10-CM | POA: Diagnosis not present

## 2015-07-19 DIAGNOSIS — R079 Chest pain, unspecified: Secondary | ICD-10-CM

## 2015-07-19 DIAGNOSIS — I739 Peripheral vascular disease, unspecified: Secondary | ICD-10-CM

## 2015-07-19 NOTE — Assessment & Plan Note (Signed)
History of hypertension with blood pressure measures 140/76. He is on losartan and amlodipine. Continue current meds at current dosing

## 2015-07-19 NOTE — Assessment & Plan Note (Signed)
History of hyperlipidemia on statin therapy as well as fenofibrate with recent lipid profile performed 07/20/14 revealing total cholesterol of 164, LDL of 95 and HDL of 43.

## 2015-07-19 NOTE — Patient Instructions (Signed)
Medication Instructions:  Your physician recommends that you continue on your current medications as directed. Please refer to the Current Medication list given to you today.   Labwork: none  Testing/Procedures: Your physician has requested that you have a carotid duplex. This test is an ultrasound of the carotid arteries in your neck. It looks at blood flow through these arteries that supply the brain with blood. Allow one hour for this exam. There are no restrictions or special instructions.  Your physician has requested that you have a lexiscan myoview. For further information please visit [email or web address]. Please follow instruction sheet, as given.    Follow-Up: Your physician recommends that you schedule a follow-up appointment AFTER THESE PROCEDURES HAVE BEEN COMPLETED.   Any Other Special Instructions Will Be Listed Below (If Applicable).     If you need a refill on your cardiac medications before your next appointment, please call your pharmacy.

## 2015-07-19 NOTE — Assessment & Plan Note (Signed)
History of peripheral vascular disease status post right BKA by Dr. Lajoyce Cornersuda  in July of last year. He now has a prosthesis which is wearing successfully and has gone through rehabilitation. This was due to a nonhealing ulcer presumed to be from tibial disease.

## 2015-07-19 NOTE — Progress Notes (Signed)
07/19/2015 Gene [name]   Aug 03, 1940  [id number]  Primary Physician [name], MD Primary Cardiologist: [name] MD [name]   HPI:  Mr. Reinders is a 75 year old moderately overweight divorced Caucasian male father of 3 and a grandfather of a grandchild who I last saw in the office 12/14/14. He was initially referred to me by Dr. Leeanne Deed for peripheral vascular evaluation.  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 Is clinically such profiles remarkable for hypertension and hyperlipidemia. He does not smoke but drinks 1-2 beers a day thought to be potentially contributory to his peripheral neuropathy (this is down from 4-5 beers a day in the past after dietary consultation and behavioral modification) . He's never had a heart attack or stroke and denies chest pain or shortness of breath. He had a transmetatarsal amputation on the leftbecause of nonhealing infected ulcer of his left second and third toes which apparently are healing well. Doppler flow showed ABIs are not obtainable because of calcified vessels with multiple short areas of total occlusion in the anterior and posterior tibial arteries respectively bilaterally. Because of an infected ulcer on his right foot he underwent right transtibial amputation by Dr.Duda 09/28/14. He subsequently had a BKA and ultimately had a prosthesis fit and went through rehabilitation.He now complains of occasional chest pain occurring several times a week which is new for him over the last several weeks to months. There has been some left upper extremity radiation as well.   Current Outpatient Prescriptions  Medication Sig Dispense Refill  . acetaminophen (TYLENOL) 325 MG tablet Take 650 mg by mouth every 6 (six) hours as needed (pain).    . Alpha-Lipoic Acid 600 MG CAPS Take 1 capsule by mouth daily.    Marland Kitchen ALPRAZolam (XANAX) 0.5 MG tablet Take 1 tablet (0.5 mg total) by mouth 3  (three) times daily. 90 tablet 1  . amLODipine (NORVASC) 5 MG tablet Take 1 tablet (5 mg total) by mouth daily. 30 tablet 6  . atorvastatin (LIPITOR) 40 MG tablet Take 1 tablet (40 mg total) by mouth at bedtime. 30 tablet 6  . b complex vitamins capsule Take 1 capsule by mouth daily.    . citalopram (CELEXA) 20 MG tablet Take 2 tablets (40 mg total) by mouth daily. 180 tablet 2  . fenofibrate 160 MG tablet Take 1 tablet (160 mg total) by mouth daily. 30 tablet 6  . Krill Oil 1000 MG CAPS Take 1,000 mg by mouth daily.     Marland Kitchen losartan (COZAAR) 100 MG tablet Take 1 tablet (100 mg total) by mouth daily. 30 tablet 6  . oxymetazoline (AFRIN) 0.05 % nasal spray Place 1 spray into both nostrils 2 (two) times daily as needed for congestion.    . pregabalin (LYRICA) 75 MG capsule Take 75 mg by mouth 2 (two) times daily.    . Probiotic Product (PROBIOTIC DAILY) CAPS Take 1 capsule by mouth daily.    Marland Kitchen Propylene Glycol (SYSTANE BALANCE OP) Apply 1 drop to eye 2 (two) times daily. In each eye    . Psyllium (METAMUCIL PO) Take 2 capsules by mouth daily.    Marland Kitchen RA ASPIRIN EC 81 MG EC tablet Take 81 mg by mouth daily.  0  . tamsulosin (FLOMAX) 0.4 MG CAPS capsule Take 1 capsule (0.4 mg total) by mouth daily. 30 capsule 3  . traZODone (DESYREL) 50 MG tablet take 1 tablet by mouth  at bedtime 30 tablet 5  . vitamin E 600 UNIT capsule Take 600 Units by mouth daily.    . Vitamins C E (VITAMIN C & E COMBINATION PO) Take 3,000 mg by mouth.     No current facility-administered medications for this visit.    No Known Allergies  Social History   Social History  . Marital Status: Divorced    Spouse Name: N/A  . Number of Children: 3  . Years of Education: College   Occupational History  . 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 Smoking status: Never Smoker   . Smokeless tobacco: Never Used  . Alcohol Use: No     Comment: h/o abuse, now on weekends, slt  heavier x last few months   States no alcohol  . Drug Use: No  . Sexual Activity: Not on file   Other Topics Concern  . Not on file   Social History Narrative    Divorced since 1994; lives by himself, 3 sons, 1 GD  (all live close by)   [place]       Review of Systems: General: negative for chills, fever, night sweats or weight changes.  Cardiovascular: negative for chest pain, dyspnea on exertion, edema, orthopnea, palpitations, paroxysmal nocturnal dyspnea or shortness of breath Dermatological: negative for rash Respiratory: negative for cough or wheezing Urologic: negative for hematuria Abdominal: negative for nausea, vomiting, diarrhea, bright red blood per rectum, melena, or hematemesis Neurologic: negative for visual changes, syncope, or dizziness All other systems reviewed and are otherwise negative except as noted above.    Blood pressure 140/76, pulse 58, height 5\' 9"  (1.753 m), weight 189 lb 9 oz (85.985 kg).  General appearance: alert and no distress Neck: no adenopathy, no JVD, supple, symmetrical, trachea midline, thyroid not enlarged, symmetric, no tenderness/mass/nodules and soft bilateral carotid bruits Lungs: clear to auscultation bilaterally Heart: regular rate and rhythm, S1, S2 normal, no murmur, click, rub or gallop Extremities: extremities normal, atraumatic, no cyanosis or edema  EKG normal sinus rhythm at 74 with inferior T-wave inversion. I personally reviewed this EKG  ASSESSMENT AND PLAN:   Hyperlipidemia History of hyperlipidemia on statin therapy as well as fenofibrate with recent lipid profile performed 07/20/14 revealing total cholesterol of 164, LDL of 95 and HDL of 43.  HTN (hypertension) History of hypertension with blood pressure measures 140/76. He is on losartan and amlodipine. Continue current meds at current dosing  PVD (peripheral vascular disease) History of peripheral vascular disease status post right BKA by Dr. Lajoyce Cornersuda  in July  of last year. He now has a prosthesis which is wearing successfully and has gone through rehabilitation. This was due to a nonhealing ulcer presumed to be from tibial disease.      Runell GessJonathan J. Exavior Kimmons MD FACP,FACC,FAHA, [facility] 07/19/2015 2:57 PM

## 2015-08-06 ENCOUNTER — Encounter: Payer: Self-pay | Admitting: Internal Medicine

## 2015-08-06 ENCOUNTER — Telehealth: Payer: Self-pay

## 2015-08-06 MED ORDER — TRAZODONE HCL 50 MG PO TABS: 50.0000 mg | ORAL_TABLET | Freq: Every day | ORAL | Status: DC

## 2015-08-06 NOTE — Telephone Encounter (Signed)
OK to refill Trazodone 50 mg same sig, number 30

## 2015-08-06 NOTE — Telephone Encounter (Signed)
Rx sent 

## 2015-08-06 NOTE — Telephone Encounter (Signed)
Pt is requesting refill on Trazodone 50 mg tablet. Dr. Leta JunglingPaz's Pt.  Last OV: 02/08/2015 Last Fill: 02/05/2015 #30 and 5RF Pt sig: 1 tablet qhs UDS: None  Please advise.

## 2015-08-21 ENCOUNTER — Encounter: Payer: Self-pay | Admitting: Internal Medicine

## 2015-08-23 ENCOUNTER — Ambulatory Visit: Payer: Medicare Other | Admitting: Internal Medicine

## 2015-08-26 ENCOUNTER — Ambulatory Visit (INDEPENDENT_AMBULATORY_CARE_PROVIDER_SITE_OTHER): Payer: Medicare Other | Admitting: Podiatry

## 2015-08-26 ENCOUNTER — Ambulatory Visit (INDEPENDENT_AMBULATORY_CARE_PROVIDER_SITE_OTHER): Payer: Medicare Other

## 2015-08-26 ENCOUNTER — Encounter: Payer: Self-pay | Admitting: Podiatry

## 2015-08-26 ENCOUNTER — Telehealth: Payer: Self-pay | Admitting: *Deleted

## 2015-08-26 ENCOUNTER — Other Ambulatory Visit (INDEPENDENT_AMBULATORY_CARE_PROVIDER_SITE_OTHER): Payer: Medicare Other

## 2015-08-26 DIAGNOSIS — M79672 Pain in left foot: Secondary | ICD-10-CM | POA: Diagnosis not present

## 2015-08-26 DIAGNOSIS — L02612 Cutaneous abscess of left foot: Secondary | ICD-10-CM

## 2015-08-26 DIAGNOSIS — L03116 Cellulitis of left lower limb: Secondary | ICD-10-CM

## 2015-08-26 LAB — BASIC METABOLIC PANEL
BUN: 28 mg/dL — ABNORMAL HIGH (ref 7–25)
CALCIUM: 9.3 mg/dL (ref 8.6–10.3)
CO2: 18 mmol/L — ABNORMAL LOW (ref 20–31)
CREATININE: 1.11 mg/dL (ref 0.70–1.18)
Chloride: 110 mmol/L (ref 98–110)
Glucose, Bld: 82 mg/dL (ref 65–99)
Potassium: 4.1 mmol/L (ref 3.5–5.3)
SODIUM: 141 mmol/L (ref 135–146)

## 2015-08-26 LAB — CBC WITH DIFFERENTIAL/PLATELET
BASOS ABS: 0 {cells}/uL (ref 0–200)
Basophils Relative: 0 %
EOS ABS: 65 {cells}/uL (ref 15–500)
EOS PCT: 1 %
HCT: 40.3 % (ref 38.5–50.0)
Hemoglobin: 13.8 g/dL (ref 13.2–17.1)
LYMPHS PCT: 16 %
Lymphs Abs: 1040 cells/uL (ref 850–3900)
MCH: 31.8 pg (ref 27.0–33.0)
MCHC: 34.2 g/dL (ref 32.0–36.0)
MCV: 92.9 fL (ref 80.0–100.0)
MONOS PCT: 14 %
MPV: 11.9 fL (ref 7.5–12.5)
Monocytes Absolute: 910 cells/uL (ref 200–950)
Neutro Abs: 4485 cells/uL (ref 1500–7800)
Neutrophils Relative %: 69 %
PLATELETS: 217 10*3/uL (ref 140–400)
RBC: 4.34 MIL/uL (ref 4.20–5.80)
RDW: 13.5 % (ref 11.0–15.0)
WBC: 6.5 10*3/uL (ref 3.8–10.8)

## 2015-08-26 MED ORDER — DOXYCYCLINE HYCLATE 100 MG PO TABS: 100.0000 mg | ORAL_TABLET | Freq: Two times a day (BID) | ORAL | Status: DC

## 2015-08-26 MED ORDER — CIPROFLOXACIN HCL 500 MG PO TABS: 500.0000 mg | ORAL_TABLET | Freq: Two times a day (BID) | ORAL | Status: DC

## 2015-08-26 NOTE — Progress Notes (Signed)
Patient ID: Gene [name], male   DOB: 12-06-1940, 75 y.o.   MRN: [id number]  Subjective: 75 year old male presents the office today for concerns of left foot swelling, redness as well as drainage to the foot. He states that on Thursday he started having increased swelling or redness. On Sunday afternoon I received a text message from the patient in regards to this. He had doxycycline at home for which he started he follows up with the office today with me. He states that yesterday he was given some overall general achiness however that has improved since starting the antibiotics. He states he is concerned about losing his left leg at this point. Denies any systemic complaints such as fevers, chills, nausea, vomiting. No acute changes since last appointment, and no other complaints at this time.   Objective: AAO x3, NAD DP/PT pulses palpable bilaterally, CRT less than 3 seconds Status post left foot transmetatarsal amputation. Along the distal flap there is a pinpoint opening which does probe approximately 1 cm deep. Small amount of pus was expressible serosanguineous drainage. There is edema and erythema to the distal aspect of the foot along the flap site. There is no ascending synovitis. There is no areas of fluctuance or crepitus. There is no malodor. No areas of pinpoint bony tenderness or pain with vibratory sensation. MMT 5/5, ROM WNL. No edema, erythema, increase in warmth to bilateral lower extremities.  No open lesions or pre-ulcerative lesions.  No pain with calf compression, swelling, warmth, erythema  Assessment: Left foot abscess, cellulitis  Plan: -All treatment options discussed with the patient including all alternatives, risks, complications.  -X-rays obtained and reviewed with the patient. There is some remodeling of the distal stumps. No definitive cortical changes suggestive osteomy this. There is no soft tissue emphysema. -Will obtain an MRI to rule out abscess. -Will  start doxycycline and ciprofloxacin -Offloading. -I discussed with them to go to the emergency room however he states that he has multiple things the pain to today his granddaughter was present today. 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 -Wound was cultured today. -CBC and BMP -Follow-up in 1 week. If symptoms worsen he is to go to the ER. He also has an appointment tomorrow with Dr. [name]. -Patient encouraged to call the office with any questions, concerns, change in symptoms.   Ovid CurdMatthew Wagoner, DPM

## 2015-08-27 ENCOUNTER — Encounter: Payer: Self-pay | Admitting: Cardiovascular Disease

## 2015-08-27 ENCOUNTER — Encounter: Payer: Self-pay | Admitting: Internal Medicine

## 2015-08-27 ENCOUNTER — Telehealth: Payer: Self-pay | Admitting: Cardiovascular Disease

## 2015-08-27 ENCOUNTER — Telehealth: Payer: Self-pay | Admitting: *Deleted

## 2015-08-27 ENCOUNTER — Telehealth: Payer: Self-pay | Admitting: Podiatry

## 2015-08-27 ENCOUNTER — Ambulatory Visit (INDEPENDENT_AMBULATORY_CARE_PROVIDER_SITE_OTHER): Payer: Medicare Other | Admitting: Internal Medicine

## 2015-08-27 VITALS — BP 118/72 | HR 78 | Temp 98.2°F | Ht 69.0 in | Wt 180.0 lb

## 2015-08-27 DIAGNOSIS — R739 Hyperglycemia, unspecified: Secondary | ICD-10-CM

## 2015-08-27 DIAGNOSIS — L02612 Cutaneous abscess of left foot: Secondary | ICD-10-CM

## 2015-08-27 DIAGNOSIS — Z1159 Encounter for screening for other viral diseases: Secondary | ICD-10-CM | POA: Diagnosis not present

## 2015-08-27 DIAGNOSIS — M79672 Pain in left foot: Secondary | ICD-10-CM

## 2015-08-27 DIAGNOSIS — E785 Hyperlipidemia, unspecified: Secondary | ICD-10-CM

## 2015-08-27 DIAGNOSIS — R399 Unspecified symptoms and signs involving the genitourinary system: Secondary | ICD-10-CM

## 2015-08-27 DIAGNOSIS — I10 Essential (primary) hypertension: Secondary | ICD-10-CM

## 2015-08-27 DIAGNOSIS — Z01812 Encounter for preprocedural laboratory examination: Secondary | ICD-10-CM

## 2015-08-27 DIAGNOSIS — L03116 Cellulitis of left lower limb: Secondary | ICD-10-CM

## 2015-08-27 LAB — LIPID PANEL
CHOL/HDL RATIO: 4
CHOLESTEROL: 124 mg/dL (ref 0–200)
HDL: 32.9 mg/dL — AB (ref >39.00)
LDL CALC: 63 mg/dL (ref 0–99)
NonHDL: 91.36
TRIGLYCERIDES: 140 mg/dL (ref 0.0–149.0)
VLDL: 28 mg/dL (ref 0.0–40.0)

## 2015-08-27 LAB — AST: AST: 16 U/L (ref 0–37)

## 2015-08-27 LAB — ALT: ALT: 12 U/L (ref 0–53)

## 2015-08-27 LAB — HEPATITIS C ANTIBODY: HCV AB: NEGATIVE

## 2015-08-27 LAB — HEMOGLOBIN A1C: Hgb A1c MFr Bld: 5.7 % (ref 4.6–6.5)

## 2015-08-27 NOTE — Telephone Encounter (Signed)
Received a text message from the patient stating that he believes "the infection looks a tad better to me today. Certainly not all that worse. Dr. Sherlyn LickPax while stating this was not his area of expertise suggested I see Dr. Lajoyce Cornersuda about the possibilty of amputation. WRONG! Talked to Dr. Renelda MomBerrys nurse...."  I reviewed pictures from Dr. [name] note and I believe it has not improved all that much and I do recommend him to go to the ER for likely admission for IV abx. Pending MRI will likely need at least OR debridement, I&D. This was relayed to him via text message.

## 2015-08-27 NOTE — Telephone Encounter (Signed)
See patient msg copied and pasted below. Of note, pt has upcoming stress test and carotid on 6/23 and f/u w Dr. Allyson [name] on 6/28  I called and spoke to patient. Sees Dr. Allyson [name] & has a history including PVD, osteomyelitis, and a Rt BKA by Dr. Lajoyce [name].  He notes left foot and leg pain, swelling -- started up last week - he saw Dr. Lajoyce [name] on Mon Jun 13th, was told no concerns. Yesterday, he was seen by Dr. Ardelle [name] who stated concern for infective process and started him on antibiotics. Pt has now been on the antibiotics for 24 hrs - he thinks he can discern some improvement of his signs and symptoms. Notes there was a non-confirmatory X-ray done on leg, and an upcoming MRI to r/o infection to bone.  I informed patient to follow the advice given by Dr. Ardelle [name]--- if pt fails to improve w/ oral antibiotics he should present to ED for workup, initiation of IV meds. Pt voiced understanding.  Pt concerned about the possibility of amputation. He no longer wants to see Dr. Lajoyce [name], he is worried that the next amputation will be a left BKA. Pt would like to see Dr. Allyson [name] to discuss this, and wanted to know if needing more urgent follow up than his scheduled visit next Wed (6/28).  He will wait on Gene [name]'s advice.

## 2015-08-27 NOTE — Telephone Encounter (Signed)
New message    The pt developed a infection in left foot, the pt was instructed not to let anything unless the pt speaks with Dr. Allyson SabalBerry

## 2015-08-27 NOTE — Telephone Encounter (Addendum)
-----   Message from Marylou MccoyJessica L Quintana, RN sent at 08/26/2015  4:37 PM EDT ----- Orders are in MRI left foot, r/o abscess and osteo. [facility] [employer] 16/12/9602-[id number]/21/2017-[employer] STATE MRI LEFT FOOT DOES NOT REQUIRE PRIOR AUTHORIZATION, CASE# [id number]. FAXED TO [facility].

## 2015-08-27 NOTE — Patient Instructions (Signed)
GO TO THE LAB : Get the blood work     GO TO THE FRONT DESK Schedule your next appointment for a routine checkup in 2 months    If you have fever, chills, the infection on your foot is not improving: Go to the ER  If you have more severe or frequent chest pain: Go to the ER

## 2015-08-27 NOTE — Progress Notes (Signed)
Subjective:    Patient ID: Gene [name], male    DOB: 03-Nov-1940, 75 y.o.   MRN: [id number]  DOS:  08/27/2015 Type of visit - description : rov Interval history: Saw Dr. Lajoyce Corners about 8 days ago, was doing well with no infection however the L foot stump become infected 5 days ago, went to see his podiatrist yesterday, they did x-rays, prescribed 2 antibiotics and they are planning a MRI d/t foot cellulitis and an abscess. HTN: Good medication compliance, ambulatory BP normal L UTS: On Flomax, still having some nocturia at night. Cardiovascular: saw cardiology ,pt states  they are planning  stress test d/t CP, I review cardiology note, there is no mention of chest pain but I see a stress test pending  Review of Systems Denies fever chills No difficulty breathing, no chest pain at the time of the office visit No nausea, vomiting, diarrhea   Past Medical History  Diagnosis Date  . Anxiety and depression 1995    symptoms started aprox 1995, after a divorce  . Hyperlipidemia     high TG  . Hypertension   . Neuropathy (HCC)   . 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 History of alcohol abuse   . 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 Family history of anesthesia complication     "father would start seeing things"  . Anxiety   . Arthritis   . Ulcer of esophagus   . Heart murmur     as a baby " outgrew" " small one per Dr Drue Novel.  . PVD (peripheral vascular disease) (HCC)     PVD, mild saw Dr Allyson Sabal 2012, intolerant to pletal, Rx observation  . History of blood transfusion   . Anemia     Past Surgical History  Procedure Laterality Date  . Cholecystectomy    . Tonsillectomy    . Amputation Left 11/20/13 and 11/24/13    middle  . Amputation Left 01/03/2014    Procedure: Left Transmetatarsal Amputation;  Surgeon: Nadara Mustard, MD;  Location: [facility] OR;  Service: Orthopedics;  Laterality: Left;  . Transmetatarsal amputation Left 10/2013    . Colonoscopy    . Amputation Right 07/27/2014    Procedure: Right 4th Ray Amputation;  Surgeon: Nadara Mustard, MD;  Location: [facility] OR;  Service: Orthopedics;  Laterality: Right;  . Esophagogastroduodenoscopy N/A 08/16/2014    Procedure: ESOPHAGOGASTRODUODENOSCOPY (EGD);  Surgeon: Vida Rigger, MD;  Location: [facility] ENDOSCOPY;  Service: Endoscopy;  Laterality: N/A;  . Esophagogastroduodenoscopy (egd) with propofol N/A 08/17/2014    Procedure: ESOPHAGOGASTRODUODENOSCOPY (EGD) WITH PROPOFOL;  Surgeon: Graylin Shiver, MD;  Location: [facility] ENDOSCOPY;  Service: Endoscopy;  Laterality: N/A;  . Toe amputation 4th r  07-27-2014  . Transmetatarsal amputation Right 07/06/02016  . Amputation Right 09/12/2014    Procedure: RIGHT TRANSMETATARSAL AMPUTATION;  Surgeon: Nadara Mustard, MD;  Location[facility] OR;  Service: Orthopedics;  Laterality: Right;  . Amputation Right 09/28/2014    Procedure: Right Below Knee Amputation;  Surgeon: Nadara Mustard, MD;  Location: [facility] OR;  Service: Orthopedics;  Laterality: Right;    Social History   Social History  . Marital Status: Divorced    Spouse Name: N/A  . Number of Children: 3  . Years of Education: College   Occupational History  . 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 Smoking status: Never Smoker   . Smokeless tobacco: Never Used  . Alcohol Use: No     Comment: h/o abuse, now on weekends, slt heavier x last few months   States no alcohol  . Drug Use: No  . Sexual Activity: Not on file   Other Topics Concern  . Not on file   Social History Narrative    Divorced since 1994; lives by himself, 3 sons, 1 GD  (all live close by)   [place] veteran          Medication List       This list is accurate as of: 08/27/15  2:06 PM.  Always use your most recent med list.               acetaminophen 325 MG tablet  Commonly known as:  TYLENOL  Take 650 mg by mouth every 6 (six) hours as needed (pain). Reported on  08/27/2015     Alpha-Lipoic Acid 600 MG Caps  Take 1 capsule by mouth daily.     ALPRAZolam 0.5 MG tablet  Commonly known as:  XANAX  Take 1 tablet (0.5 mg total) by mouth 3 (three) times daily.     amLODipine 5 MG tablet  Commonly known as:  NORVASC  Take 1 tablet (5 mg total) by mouth daily.     atorvastatin 40 MG tablet  Commonly known as:  LIPITOR  Take 1 tablet (40 mg total) by mouth at bedtime.     b complex vitamins capsule  Take 2 capsules by mouth daily.     ciprofloxacin 500 MG tablet  Commonly known as:  CIPRO  Take 1 tablet (500 mg total) by mouth 2 (two) times daily.     citalopram 20 MG tablet  Commonly known as:  CELEXA  Take 2 tablets (40 mg total) by mouth daily.     doxycycline 100 MG tablet  Commonly known as:  VIBRA-TABS  Take 1 tablet (100 mg total) by mouth 2 (two) times daily.     fenofibrate 160 MG tablet  Take 1 tablet (160 mg total) by mouth daily.     Krill Oil 1000 MG Caps  Take 4,000 mg by mouth daily.     losartan 100 MG tablet  Commonly known as:  COZAAR  Take 1 tablet (100 mg total) by mouth daily.     METAMUCIL PO  Take 2 capsules by mouth daily.     oxymetazoline 0.05 % nasal spray  Commonly known as:  AFRIN  Place 1 spray into both nostrils 2 (two) times daily as needed for congestion. Reported on 08/27/2015     pregabalin 75 MG capsule  Commonly known as:  LYRICA  Take 75 mg by mouth 2 (two) times daily.     PROBIOTIC DAILY Caps  Take 1 capsule by mouth daily.     RA ASPIRIN EC 81 MG EC tablet  Generic drug:  aspirin  Take 81 mg by mouth daily.     SYSTANE BALANCE OP  Apply 1 drop to eye 2 (two) times daily. Reported on 08/27/2015     tamsulosin 0.4 MG Caps capsule  Commonly known as:  FLOMAX  Take 1 capsule (0.4 mg total) by mouth daily.     traZODone 50 MG tablet  Commonly known as:  DESYREL  Take 1 tablet (50 mg total) by mouth at bedtime.     VITAMIN C & E COMBINATION PO  Take 3,000 mg by  mouth.     vitamin  E 600 UNIT capsule  Take 600 Units by mouth daily. Reported on 08/27/2015           Objective:   Physical Exam BP 118/72 mmHg  Pulse 78  Temp(Src) 98.2 F (36.8 C) (Oral)  Ht 5\' 9"  (1.753 m)  Wt 180 lb (81.647 kg)  BMI 26.57 kg/m2  SpO2 97%     General:   Well developed, well nourished . NAD.  HEENT:  Normocephalic . Face symmetric, atraumatic Lungs:  CTA B Normal respiratory effort, no intercostal retractions, no accessory muscle use. Heart: RRR,  + syst  murmur.  Left foot: See pictures, distally there is redness, swelling, some flutuancy. Skin: Not pale. Not jaundice Neurologic:  alert & oriented X3.  Speech normal, gait appropriate for age and unassisted Psych--  Cognition and judgment appear intact.  Cooperative with normal attention span and concentration.  Behavior appropriate. No anxious or depressed appearing.      Assessment & Plan:   Assessement > Hyperglycemia,  (A1c 5.8  2013) Neuropathy , peripheral: d/t ETOH (per neuro note, Dr Anne HahnWillis,  2014) NCS 05-2011: Moderate neuropathy, low-grade right L5 radiculopathy Bilateral ulnar neuropathy HTN Hyperlipidemia Anxiety, depression , insomnia onset 1995 after a divorce Aortic stenosis: Mild per echocardiogram 12-2014, has a soft murmur Peripheral vascular disease --- per Dr. Allyson SabalBerry . Pletal intolerant, ABIs normal but vessels are noncompressible MSK: --Osteomyelitis --Multiple toes amputations 2014 --Left transmetatarsal amputation 01/03/2014 (osteomyelitis) --Right transmetatarsal amputation 09/12/2014 -- R BKA  09/28/2014 d/  Dehiscence midfoot amputation GI bleed, admited 08-2014 >>> EGD  esophageal ulcer (d/t nsaids and doxycycline ?) H/o Alcohol abuse   PLAN Hyperglycemia: Check A1c HTN: Controlled, BMP and CBC yesterday within normal Hyperlipidemia: On Lipitor, check a FLP, AST, ALT Cellulitis, infection left foot: I strongly suspect he will need debridement at the OR, he is at risk to lose   what is left of his L foot;  I shared my concerns with the patient, recommend to see Dr. Lajoyce Cornersuda ASAP (in addition to see Dr Ardelle AntonWagoner), states he won't see Dr Lajoyce Cornersuda again. Will then refer to Dr. Victorino DikeHewitt ASAP. Wound redressed today here at the office with gauze Chest pain: Reports chest pain, I see that he has a stress test pending, no CP at this time, recommend to proceed with a stress test and go to the ER if chest pain increases LUTS: Still has some symptoms despite taking Flomax. Reassess at the next opportunity Hep C screening per patient request RTC 2 months

## 2015-08-27 NOTE — Telephone Encounter (Signed)
Dr Berry:I have developed an infection in my left foot which already has all the toes amputated(2015).I am seeing Dr. Ardelle AntonWagoner and he has prescribed Doxcycline 500mg  and Cyprefloxacin 100mg .Dr. Loreta AveWagner did an X Ray and could not detect any bone infection but ordered an MRI to be sure. He said he thought it would be fine but if it got worse before I see him next week, I should go to the ER/hospital for an antibiotic drip. My experience in-patient is I am at the mercy of the nurses and never see a doctor.I saw Dr. [name] today for a routine visit and blood exam.He looked at my foot and, while acknowledging this was not his area of expertise, suggested I go to Dr. Lajoyce Cornersuda for possible partial amputation.Dr. Lajoyce Cornersuda will not partially amputate; after the toes the leg comes off below the knee.You once told me to see you before anyone tries to remove my left leg.Can I see you ASAP to see if you can increase blood flow to the infection and save my leg?    A very concerned Gene DearBill [name]    From patient mychart message

## 2015-08-27 NOTE — Telephone Encounter (Signed)
Spoke with Dr Allyson SabalBerry who said he will discuss with pt at office visit on 09/04/15.  Returned call to pt. He verbalized understanding and was pleased with this response.

## 2015-08-27 NOTE — Progress Notes (Signed)
Pre visit review using our clinic review tool, if applicable. No additional management support is needed unless otherwise documented below in the visit note. 

## 2015-08-27 NOTE — Assessment & Plan Note (Signed)
Hyperglycemia: Check A1c HTN: Controlled, BMP and CBC yesterday within normal Hyperlipidemia: On Lipitor, check a FLP, AST, ALT Cellulitis, infection left foot: I strongly suspect he will need debridement at the OR, he is at risk to lose  what is left of his L foot;  I shared my concerns with the patient, recommend to see Dr. Lajoyce Cornersuda ASAP (in addition to see Dr Ardelle AntonWagoner), states he won't see Dr Lajoyce Cornersuda again. Will then refer to Dr. Victorino DikeHewitt ASAP. Wound redressed today here at the office with gauze Chest pain: Reports chest pain, I see that he has a stress test pending, no CP at this time, recommend to proceed with a stress test and go to the ER if chest pain increases LUTS: Still has some symptoms despite taking Flomax. Reassess at the next opportunity Hep C screening per patient request RTC 2 months

## 2015-08-28 ENCOUNTER — Telehealth (HOSPITAL_COMMUNITY): Payer: Self-pay

## 2015-08-28 ENCOUNTER — Inpatient Hospital Stay (HOSPITAL_COMMUNITY)
Admission: EM | Admit: 2015-08-28 | Discharge: 2015-09-01 | DRG: 638 | Disposition: A | Discharge status: Home / Self Care | Payer: Medicare Other | Attending: Internal Medicine | Admitting: Internal Medicine

## 2015-08-28 ENCOUNTER — Encounter (HOSPITAL_COMMUNITY): Payer: Self-pay

## 2015-08-28 ENCOUNTER — Inpatient Hospital Stay (HOSPITAL_COMMUNITY): Payer: Medicare Other

## 2015-08-28 ENCOUNTER — Telehealth: Payer: Self-pay | Admitting: Internal Medicine

## 2015-08-28 DIAGNOSIS — G629 Polyneuropathy, unspecified: Secondary | ICD-10-CM

## 2015-08-28 DIAGNOSIS — Z89439 Acquired absence of unspecified foot: Secondary | ICD-10-CM | POA: Diagnosis not present

## 2015-08-28 DIAGNOSIS — F418 Other specified anxiety disorders: Secondary | ICD-10-CM | POA: Diagnosis not present

## 2015-08-28 DIAGNOSIS — I10 Essential (primary) hypertension: Secondary | ICD-10-CM | POA: Diagnosis present

## 2015-08-28 DIAGNOSIS — M79672 Pain in left foot: Secondary | ICD-10-CM | POA: Diagnosis not present

## 2015-08-28 DIAGNOSIS — F329 Major depressive disorder, single episode, unspecified: Secondary | ICD-10-CM | POA: Diagnosis present

## 2015-08-28 DIAGNOSIS — E11621 Type 2 diabetes mellitus with foot ulcer: Principal | ICD-10-CM | POA: Diagnosis present

## 2015-08-28 DIAGNOSIS — E785 Hyperlipidemia, unspecified: Secondary | ICD-10-CM | POA: Diagnosis present

## 2015-08-28 DIAGNOSIS — L97529 Non-pressure chronic ulcer of other part of left foot with unspecified severity: Secondary | ICD-10-CM | POA: Diagnosis present

## 2015-08-28 DIAGNOSIS — L03116 Cellulitis of left lower limb: Secondary | ICD-10-CM | POA: Diagnosis present

## 2015-08-28 DIAGNOSIS — F419 Anxiety disorder, unspecified: Secondary | ICD-10-CM | POA: Diagnosis present

## 2015-08-28 DIAGNOSIS — Z89511 Acquired absence of right leg below knee: Secondary | ICD-10-CM

## 2015-08-28 DIAGNOSIS — F32A Depression, unspecified: Secondary | ICD-10-CM | POA: Diagnosis present

## 2015-08-28 DIAGNOSIS — L97509 Non-pressure chronic ulcer of other part of unspecified foot with unspecified severity: Secondary | ICD-10-CM

## 2015-08-28 DIAGNOSIS — Z7982 Long term (current) use of aspirin: Secondary | ICD-10-CM | POA: Diagnosis not present

## 2015-08-28 DIAGNOSIS — I739 Peripheral vascular disease, unspecified: Secondary | ICD-10-CM

## 2015-08-28 DIAGNOSIS — E1151 Type 2 diabetes mellitus with diabetic peripheral angiopathy without gangrene: Secondary | ICD-10-CM | POA: Diagnosis present

## 2015-08-28 DIAGNOSIS — E1169 Type 2 diabetes mellitus with other specified complication: Secondary | ICD-10-CM

## 2015-08-28 DIAGNOSIS — F341 Dysthymic disorder: Secondary | ICD-10-CM

## 2015-08-28 HISTORY — DX: Depression, unspecified: F32.A

## 2015-08-28 HISTORY — DX: Non-pressure chronic ulcer of other part of left foot with unspecified severity: L97.529

## 2015-08-28 HISTORY — DX: Type 2 diabetes mellitus with foot ulcer: E11.621

## 2015-08-28 HISTORY — DX: Nonrheumatic aortic (valve) stenosis: I35.0

## 2015-08-28 HISTORY — DX: Major depressive disorder, single episode, unspecified: F32.9

## 2015-08-28 LAB — CBC WITH DIFFERENTIAL/PLATELET
BASOS ABS: 0 10*3/uL (ref 0.0–0.1)
BASOS ABS: 0 10*3/uL (ref 0.0–0.1)
BASOS PCT: 0 %
Basophils Relative: 0 %
EOS ABS: 0.1 10*3/uL (ref 0.0–0.7)
EOS ABS: 0.1 10*3/uL (ref 0.0–0.7)
EOS PCT: 3 %
Eosinophils Relative: 2 %
HCT: 39.1 % (ref 39.0–52.0)
HEMATOCRIT: 39.4 % (ref 39.0–52.0)
HEMOGLOBIN: 13.1 g/dL (ref 13.0–17.0)
HEMOGLOBIN: 13.2 g/dL (ref 13.0–17.0)
LYMPHS PCT: 23 %
Lymphocytes Relative: 17 %
Lymphs Abs: 0.9 10*3/uL (ref 0.7–4.0)
Lymphs Abs: 1.2 10*3/uL (ref 0.7–4.0)
MCH: 29.9 pg (ref 26.0–34.0)
MCH: 30.3 pg (ref 26.0–34.0)
MCHC: 33.2 g/dL (ref 30.0–36.0)
MCHC: 33.8 g/dL (ref 30.0–36.0)
MCV: 90 fL (ref 78.0–100.0)
MCV: 89.7 fL (ref 78.0–100.0)
Monocytes Absolute: 0.6 10*3/uL (ref 0.1–1.0)
Monocytes Absolute: 0.6 10*3/uL (ref 0.1–1.0)
Monocytes Relative: 10 %
Monocytes Relative: 11 %
NEUTROS ABS: 3.9 10*3/uL (ref 1.7–7.7)
NEUTROS PCT: 71 %
NEUTROS PCT: 63 %
Neutro Abs: 3.1 10*3/uL (ref 1.7–7.7)
PLATELETS: 226 10*3/uL (ref 150–400)
Platelets: 208 10*3/uL (ref 150–400)
RBC: 4.38 MIL/uL (ref 4.22–5.81)
RBC: 4.36 MIL/uL (ref 4.22–5.81)
RDW: 13.3 % (ref 11.5–15.5)
RDW: 13.2 % (ref 11.5–15.5)
WBC: 5.5 10*3/uL (ref 4.0–10.5)
WBC: 5 10*3/uL (ref 4.0–10.5)

## 2015-08-28 LAB — BASIC METABOLIC PANEL
ANION GAP: 10 (ref 5–15)
Anion gap: 11 (ref 5–15)
BUN: 20 mg/dL (ref 6–20)
BUN: 17 mg/dL (ref 6–20)
CHLORIDE: 109 mmol/L (ref 101–111)
CO2: 18 mmol/L — ABNORMAL LOW (ref 22–32)
CO2: 18 mmol/L — ABNORMAL LOW (ref 22–32)
CREATININE: 1.03 mg/dL (ref 0.61–1.24)
Calcium: 9.5 mg/dL (ref 8.9–10.3)
Calcium: 9.3 mg/dL (ref 8.9–10.3)
Chloride: 111 mmol/L (ref 101–111)
Creatinine, Ser: 1.06 mg/dL (ref 0.61–1.24)
Glucose, Bld: 93 mg/dL (ref 65–99)
Glucose, Bld: 107 mg/dL — ABNORMAL HIGH (ref 65–99)
POTASSIUM: 4.1 mmol/L (ref 3.5–5.1)
POTASSIUM: 3.6 mmol/L (ref 3.5–5.1)
SODIUM: 139 mmol/L (ref 135–145)
SODIUM: 138 mmol/L (ref 135–145)

## 2015-08-28 LAB — SEDIMENTATION RATE
SED RATE: 41 mm/h — AB (ref 0–16)
SED RATE: 33 mm/h — AB (ref 0–16)

## 2015-08-28 LAB — PREALBUMIN: PREALBUMIN: 13.5 mg/dL — AB (ref 18–38)

## 2015-08-28 LAB — C-REACTIVE PROTEIN: CRP: 3.9 mg/dL — ABNORMAL HIGH (ref <1.0)

## 2015-08-28 MED ORDER — OXYMETAZOLINE HCL 0.05 % NA SOLN: 1.0000 | Freq: Two times a day (BID) | NASAL | Status: DC | PRN

## 2015-08-28 MED ORDER — B COMPLEX VITAMINS PO CAPS: 2.0000 | ORAL_CAPSULE | Freq: Every day | ORAL | Status: DC

## 2015-08-28 MED ORDER — AMLODIPINE BESYLATE 5 MG PO TABS
5.0000 mg | ORAL_TABLET | Freq: Every day | ORAL | Status: DC
  Administered 2015-08-29 – 2015-09-01 (×4): 5 mg via ORAL
  Filled 2015-08-28 (×5): qty 1

## 2015-08-28 MED ORDER — DEXTROSE 5 % IV SOLN
2.0000 g | Freq: Two times a day (BID) | INTRAVENOUS | Status: DC
  Administered 2015-08-28 – 2015-08-31 (×7): 2 g via INTRAVENOUS
  Filled 2015-08-28 (×9): qty 2

## 2015-08-28 MED ORDER — POLYVINYL ALCOHOL 1.4 % OP SOLN
1.0000 [drp] | OPHTHALMIC | Status: DC | PRN
  Filled 2015-08-28: qty 15

## 2015-08-28 MED ORDER — ATORVASTATIN CALCIUM 40 MG PO TABS
40.0000 mg | ORAL_TABLET | Freq: Every day | ORAL | Status: DC
  Administered 2015-08-28 – 2015-08-31 (×4): 40 mg via ORAL
  Filled 2015-08-28 (×4): qty 1

## 2015-08-28 MED ORDER — METRONIDAZOLE IN NACL 5-0.79 MG/ML-% IV SOLN
500.0000 mg | Freq: Three times a day (TID) | INTRAVENOUS | Status: DC
  Administered 2015-08-28 – 2015-09-01 (×12): 500 mg via INTRAVENOUS
  Filled 2015-08-28 (×12): qty 100

## 2015-08-28 MED ORDER — PREGABALIN 75 MG PO CAPS
75.0000 mg | ORAL_CAPSULE | Freq: Two times a day (BID) | ORAL | Status: DC
  Administered 2015-08-28 – 2015-09-01 (×8): 75 mg via ORAL
  Filled 2015-08-28 (×8): qty 1

## 2015-08-28 MED ORDER — ALPHA-LIPOIC ACID 600 MG PO CAPS: 1.0000 | ORAL_CAPSULE | Freq: Every day | ORAL | Status: DC

## 2015-08-28 MED ORDER — VANCOMYCIN HCL IN DEXTROSE 750-5 MG/150ML-% IV SOLN
750.0000 mg | Freq: Two times a day (BID) | INTRAVENOUS | Status: DC
  Administered 2015-08-29 – 2015-09-01 (×7): 750 mg via INTRAVENOUS
  Filled 2015-08-28 (×8): qty 150

## 2015-08-28 MED ORDER — ALPRAZOLAM 0.5 MG PO TABS
0.5000 mg | ORAL_TABLET | Freq: Three times a day (TID) | ORAL | Status: DC
  Administered 2015-08-28 – 2015-09-01 (×11): 0.5 mg via ORAL
  Filled 2015-08-28 (×11): qty 1

## 2015-08-28 MED ORDER — B COMPLEX-C PO TABS
1.0000 | ORAL_TABLET | Freq: Every day | ORAL | Status: DC
  Administered 2015-08-29 – 2015-09-01 (×4): 1 via ORAL
  Filled 2015-08-28 (×4): qty 1

## 2015-08-28 MED ORDER — ACETAMINOPHEN 325 MG PO TABS: 650.0000 mg | ORAL_TABLET | Freq: Four times a day (QID) | ORAL | Status: DC | PRN

## 2015-08-28 MED ORDER — CITALOPRAM HYDROBROMIDE 40 MG PO TABS
40.0000 mg | ORAL_TABLET | Freq: Every day | ORAL | Status: DC
  Administered 2015-08-29 – 2015-09-01 (×4): 40 mg via ORAL
  Filled 2015-08-28 (×4): qty 1

## 2015-08-28 MED ORDER — VANCOMYCIN HCL 10 G IV SOLR
1500.0000 mg | Freq: Once | INTRAVENOUS | Status: AC
  Administered 2015-08-28: 1500 mg via INTRAVENOUS
  Filled 2015-08-28: qty 1500

## 2015-08-28 MED ORDER — PROPYLENE GLYCOL 0.6 % OP SOLN: Freq: Two times a day (BID) | OPHTHALMIC | Status: DC | PRN

## 2015-08-28 MED ORDER — PSYLLIUM 95 % PO PACK
1.0000 | PACK | Freq: Every day | ORAL | Status: DC
  Administered 2015-08-29: 1 via ORAL
  Filled 2015-08-28 (×4): qty 1

## 2015-08-28 MED ORDER — LOSARTAN POTASSIUM 50 MG PO TABS
100.0000 mg | ORAL_TABLET | Freq: Every day | ORAL | Status: DC
  Administered 2015-08-29 – 2015-09-01 (×4): 100 mg via ORAL
  Filled 2015-08-28 (×4): qty 2

## 2015-08-28 MED ORDER — TRAZODONE HCL 50 MG PO TABS
50.0000 mg | ORAL_TABLET | Freq: Every day | ORAL | Status: DC
  Administered 2015-08-28 – 2015-08-31 (×4): 50 mg via ORAL
  Filled 2015-08-28 (×4): qty 1

## 2015-08-28 MED ORDER — RISAQUAD PO CAPS
1.0000 | ORAL_CAPSULE | Freq: Every day | ORAL | Status: DC
  Administered 2015-08-29 – 2015-09-01 (×4): 1 via ORAL
  Filled 2015-08-28 (×4): qty 1

## 2015-08-28 MED ORDER — ASPIRIN EC 81 MG PO TBEC
81.0000 mg | DELAYED_RELEASE_TABLET | Freq: Every day | ORAL | Status: DC
  Administered 2015-08-29 – 2015-09-01 (×4): 81 mg via ORAL
  Filled 2015-08-28 (×4): qty 1

## 2015-08-28 MED ORDER — FENOFIBRATE 160 MG PO TABS
160.0000 mg | ORAL_TABLET | Freq: Every day | ORAL | Status: DC
  Administered 2015-08-29 – 2015-09-01 (×4): 160 mg via ORAL
  Filled 2015-08-28 (×4): qty 1

## 2015-08-28 MED ORDER — SODIUM CHLORIDE 0.9 % IV SOLN
INTRAVENOUS | Status: AC
  Administered 2015-08-28: 20:00:00 via INTRAVENOUS

## 2015-08-28 MED ORDER — TAMSULOSIN HCL 0.4 MG PO CAPS
0.4000 mg | ORAL_CAPSULE | Freq: Every day | ORAL | Status: DC
  Administered 2015-08-28 – 2015-09-01 (×5): 0.4 mg via ORAL
  Filled 2015-08-28 (×5): qty 1

## 2015-08-28 NOTE — ED Provider Notes (Signed)
CSN: [id number]     Arrival date & time 08/28/15  1037 History  By signing my name below, I, Tanda RockersMargaux Venter, attest that this documentation has been prepared under the direction and in the presence of Rolland PorterMark Geran Haithcock, MD. Electronically Signed: Tanda RockersMargaux Venter, ED Scribe. 08/28/2015. 1:15 PM.   Chief Complaint  Patient presents with  . Foot Swelling   The history is provided by the patient. No language interpreter was used.    HPI Comments: Gene [name] is a 75 y.o. male with PMHx HTN, HLD, and peripheral vascular disease who presents to the Emergency Department complaining of gradual onset, constant, left foot swelling, redness, and drainage x 1 week. Pt has PSHx left transmetatarsal amputation. He was seen by his podiatrist Dr. Ardelle AntonWagoner on 08/19/2015 (approximatley 1.5 weeks ago) for a follow up with no issues. 3 days later pt began noticing the redness and swelling with a draining ulcer to the left foot. Pt was placed on Doxycycline and Cipro 2 days ago by podiatrist. He also had an x ray done which showed no osteomyelitis at that time. He was told if his symptoms did not improve that he should be admitted for IV antibiotics and an MRI, prompting pt to come to the ED today. Denies pain to area, fever, chills, nausea, vomiting, or any other associated symptoms. No hx DM.   Past Medical History  Diagnosis Date  . Anxiety and depression 1995    symptoms started aprox 1995, after a divorce  . Hyperlipidemia     high TG  . Hypertension   . Neuropathy (HCC)   . 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 History of alcohol abuse   . 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 Family history of anesthesia complication     "father would start seeing things"  . Anxiety   . Arthritis   . Ulcer of esophagus   . Heart murmur     as a baby " outgrew" " small one per Dr [name].  . PVD (peripheral vascular disease) (HCC)     PVD, mild saw Dr Allyson SabalBerry 2012,  intolerant to pletal, Rx observation  . History of blood transfusion   . Anemia   . Diabetic ulcer of left foot (HCC) 08/28/2015   Past Surgical History  Procedure Laterality Date  . Cholecystectomy    . Tonsillectomy    . Amputation Left 11/20/13 and 11/24/13    middle  . Amputation Left 01/03/2014    Procedure: Left Transmetatarsal Amputation;  Surgeon: Nadara MustardMarcus Duda V, MD;  [facility];  Service: Orthopedics;  Laterality: Left;  . Transmetatarsal amputation Left 10/2013  . Colonoscopy    . Amputation Right 07/27/2014    Procedure: Right 4th Ray Amputation;  Surgeon: Nadara MustardMarcus Duda V, MD;  [facility];  Service: Orthopedics;  Laterality: Right;  . Esophagogastroduodenoscopy N/A 08/16/2014    Procedure: ESOPHAGOGASTRODUODENOSCOPY (EGD);  Surgeon: Vida RiggerMarc Magod, MD;  [facility];  Service: Endoscopy;  Laterality: N/A;  . Esophagogastroduodenoscopy (egd) with propofol N/A 08/17/2014    Procedure: ESOPHAGOGASTRODUODENOSCOPY (EGD) WITH PROPOFOL;  Surgeon: Graylin ShiverSalem F Ganem, MD;  [facility];  Service: Endoscopy;  Laterality: N/A;  . Toe amputation 4th r  07-27-2014  . Transmetatarsal amputation Right 07/06/02016  . Amputation Right 09/12/2014    Procedure: RIGHT TRANSMETATARSAL AMPUTATION;  Surgeon: Nadara MustardMarcus Duda V, MD;  [facility];  Service: Orthopedics;  Laterality: Right;  .  Amputation Right 09/28/2014    Procedure: Right Below Knee Amputation;  Surgeon: Nadara Mustard, MD;  [facility];  Service: Orthopedics;  Laterality: Right;   Family History  Problem Relation Age of Onset  . Hyperlipidemia Mother   . Lung cancer Father     asbestos releated  . Colon polyps Father   . Diabetes Neg Hx   . Coronary artery disease Neg Hx   . Prostate cancer Neg Hx    Social History  Substance Use Topics  . Smoking status: Never Smoker   . Smokeless tobacco: Never Used  . Alcohol Use: 2.4 oz/week    4 Cans of beer, 0 Standard drinks or equivalent per week     Comment: h/o  abuse, now on weekends, slt heavier x last few months   States no alcohol    Review of Systems  Constitutional: Negative for fever, chills, diaphoresis, appetite change and fatigue.  HENT: Negative for mouth sores, sore throat and trouble swallowing.   Eyes: Negative for visual disturbance.  Respiratory: Negative for cough, chest tightness, shortness of breath and wheezing.   Cardiovascular: Negative for chest pain.  Gastrointestinal: Negative for nausea, vomiting, abdominal pain, diarrhea and abdominal distention.  Endocrine: Negative for polydipsia, polyphagia and polyuria.  Genitourinary: Negative for dysuria, frequency and hematuria.  Musculoskeletal: Negative for myalgias, arthralgias and gait problem.  Skin: Positive for color change and wound. Negative for pallor and rash.       Draining ulcer to left foot with swelling and redness  Neurological: Negative for dizziness, syncope, light-headedness and headaches.  Hematological: Does not bruise/bleed easily.  Psychiatric/Behavioral: Negative for behavioral problems and confusion.   Allergies  Review of patient's allergies indicates no known allergies.  Home Medications   Prior to Admission medications   Medication Sig Start Date End Date Taking? Authorizing Provider  acetaminophen (TYLENOL) 325 MG tablet Take 650 mg by mouth every 6 (six) hours as needed (pain). Reported on 08/27/2015   Yes Historical Provider, MD  Alpha-Lipoic Acid 600 MG CAPS Take 1 capsule by mouth daily.   Yes Historical Provider, MD  ALPRAZolam Prudy Feeler) 0.5 MG tablet Take 1 tablet (0.5 mg total) by mouth 3 (three) times daily. 07/09/15  Yes [name], MD  amLODipine (NORVASC) 5 MG tablet Take 1 tablet (5 mg total) by mouth daily. 01/07/15  Yes [name], MD  atorvastatin (LIPITOR) 40 MG tablet Take 1 tablet (40 mg total) by mouth at bedtime. 01/07/15  Yes [name], MD  b complex vitamins capsule Take 2 capsules by mouth daily.    Yes Historical Provider, MD   ciprofloxacin (CIPRO) 500 MG tablet Take 1 tablet (500 mg total) by mouth 2 (two) times daily. 08/26/15  Yes Vivi Barrack, DPM  citalopram (CELEXA) 20 MG tablet Take 2 tablets (40 mg total) by mouth daily. 03/05/15  Yes [name], MD  doxycycline (VIBRA-TABS) 100 MG tablet Take 1 tablet (100 mg total) by mouth 2 (two) times daily. 08/26/15  Yes Vivi Barrack, DPM  fenofibrate 160 MG tablet Take 1 tablet (160 mg total) by mouth daily. 01/07/15  Yes [name], MD  Krill Oil 1000 MG CAPS Take 4,000 mg by mouth daily.    Yes Historical Provider, MD  losartan (COZAAR) 100 MG tablet Take 1 tablet (100 mg total) by mouth daily. 01/07/15  Yes [name], MD  oxymetazoline (AFRIN) 0.05 % nasal spray Place 1 spray into both nostrils 2 (two)  times daily as needed for congestion. Reported on 08/27/2015   Yes Historical Provider, MD  pregabalin (LYRICA) 75 MG capsule Take 75 mg by mouth 2 (two) times daily.   Yes Historical Provider, MD  Probiotic Product (PROBIOTIC DAILY) CAPS Take 1 capsule by mouth daily.   Yes Historical Provider, MD  Propylene Glycol (SYSTANE BALANCE OP) Apply 1 drop to eye 2 (two) times daily as needed (dry eyes). Reported on 08/27/2015   Yes Historical Provider, MD  Psyllium (METAMUCIL PO) Take 2 capsules by mouth daily.   Yes Historical Provider, MD  RA ASPIRIN EC 81 MG EC tablet Take 81 mg by mouth daily. 12/06/14  Yes Historical Provider, MD  tamsulosin (FLOMAX) 0.4 MG CAPS capsule Take 1 capsule (0.4 mg total) by mouth daily. 02/08/15  Yes [name], MD  traZODone (DESYREL) 50 MG tablet Take 1 tablet (50 mg total) by mouth at bedtime. 08/06/15  Yes Bradd Canary, MD  Vitamins C E (VITAMIN C & E COMBINATION PO) Take 3,000 mg by mouth daily.    Yes Historical Provider, MD   BP 136/75 mmHg  Pulse 69  Temp(Src) 97.7 F (36.5 C) (Oral)  Resp 18  Ht  (1.753 m)  Wt 180 lb (81.647 kg)  BMI 26.57 kg/m2  SpO2 97% Physical Exam  Constitutional: He is oriented to person,  place, and time. He appears well-developed and well-nourished. No distress.  HENT:  Head: Normocephalic.  Eyes: Conjunctivae are normal. Pupils are equal, round, and reactive to light. No scleral icterus.  Neck: Normal range of motion. Neck supple. No thyromegaly present.  Cardiovascular: Normal rate and regular rhythm.  Exam reveals no gallop and no friction rub.   No murmur heard. Pulmonary/Chest: Effort normal and breath sounds normal. No respiratory distress. He has no wheezes. He has no rales.  Abdominal: Soft. Bowel sounds are normal. He exhibits no distension. There is no tenderness. There is no rebound.  Musculoskeletal:  Right BKA.  Left foot - well healed appearing transmetatarsal amputation. Some erythema medially and proximally striations. 1 cm white skin ulcer with about 1 mm draining sinus. Capillary refill < 2 seconds.   Neurological: He is alert and oriented to person, place, and time.  Skin: Skin is warm and dry. No rash noted.  Psychiatric: He has a normal mood and affect. His behavior is normal.    ED Course  Procedures (including critical care time)  DIAGNOSTIC STUDIES: Oxygen Saturation is 96% on RA, normal by my interpretation.    COORDINATION OF CARE: 1:13 PM-Discussed treatment plan with pt at bedside and pt agreed to plan.   Labs Review Labs Reviewed  SEDIMENTATION RATE - Abnormal; Notable for the following:    Sed Rate 41 (*)    All other components within normal limits  BASIC METABOLIC PANEL - Abnormal; Notable for the following:    CO2 18 (*)    All other components within normal limits  CULTURE, BLOOD (ROUTINE X 2)  CULTURE, BLOOD (ROUTINE X 2)  CBC WITH DIFFERENTIAL/PLATELET    Imaging Review No results found. I have personally reviewed and evaluated these images and lab results as part of my medical decision-making.   EKG Interpretation None      MDM   Final diagnoses:  Foot ulcer (HCC)    Ultimately, after discussion with multiple  orthopedic groups, patient is agreeable to being seen in consultation by Dr. Aldean Baker. He will see the patient for morning after a p.m. MRI tonight  to evaluate for possible bony abnormalities or need for surgical intervention.  I personally performed the services described in this documentation, which was scribed in my presence. The recorded information has been reviewed and is accurate.      Rolland Porter, MD 08/28/15 1747

## 2015-08-28 NOTE — Progress Notes (Signed)
Pharmacy Antibiotic Note  Iona CoachWilliam W Apolinar III is a 75 y.o. male admitted on 08/28/2015 with a swollen L foot.  Pharmacy has been consulted for vancomycin dosing.  Day #1 of abx for foot swelling. Has a history of osteo. Afebrile, WBC wnl. SCr 1.11, CrCl ~5260ml/min.  Plan: Give vancomycin 1.5g IV x 1, then start vancomycin 750mg  IV Q12 Monitor clinical picture, renal function, VT prn F/U C&S, abx deescalation / LOT   Height: 5\' 9"  (175.3 cm) Weight: 180 lb (81.647 kg) IBW/kg (Calculated) : 70.7  Temp (24hrs), Avg:97.7 F (36.5 C), Min:97.7 F (36.5 C), Max:97.7 F (36.5 C)   Recent Labs Lab 08/26/15 1550  WBC 6.5  CREATININE 1.11    Estimated Creatinine Clearance: 59.3 mL/min (by C-G formula based on Cr of 1.11).    No Known Allergies  Antimicrobials this admission: Vancomycin 6/21 >>    Dose adjustments this admission: n/a  Microbiology results: 6/21 BCx: sent  Thank you for allowing pharmacy to be a part of this patient's care.  Armandina StammerBATCHELDER,Towanna Avery J 08/28/2015 1:25 PM

## 2015-08-28 NOTE — ED Notes (Signed)
Pt. Has peripheral vascular disease and missing toes on the swollen L foot. Pt. States that on the 15th the foot was swollen and red. MD  started patient on doxycycline and cipro and advised pt. To seek IV antibiotics.

## 2015-08-28 NOTE — Telephone Encounter (Signed)
Encounter complete. 

## 2015-08-28 NOTE — Telephone Encounter (Signed)
°  Relationship to patient: Self Can be reached: [phone]     Reason for call: Patient called to inform PCP that he is in route to the ER @ [facility].

## 2015-08-28 NOTE — H&P (Addendum)
History and Physical    Gene [name] ZOX:[id number] DOB: 1940-04-13 DOA: 08/28/2015  Referring MD/NP/PA: Dr. Fayrene Fearing ED   PCP: [name], MD   Outpatient Specialists: Podiatry, Dr. Ardelle Anton; Cardiology, Dr. Nanetta Batty   Patient coming from: home   Chief Complaint: diabetic foot ulcer   HPI: Gene [name] is a 75 y.o. male with medical history significant for peripheral vascular disease, right BKA and left transmetatarsal amputation, dyslipidemia and hypertension who presented to UC because of left stump infection and was given antibiotics (cipro and doxycycline) and plan was to get an MRI of the foot. Pt could not come to ED the day when he saw his PCP but came today for evaluation. No fevers. He has draining ulcer on the left foot with some erythema surrounding it and per pt it is not getting better. No reports of pain. No other reports such as chest pain, shortness of breath or wheezing. No palpitations. No abdominal pain, nausea or vomiting.  ED Course: BP was 142/75, HR 75, RR 19, afebrile. Blood work was unremarkable. MRI of the foot is pending. Dr. Lajoyce Corners to see the pt in consultation.   Review of Systems:  Constitutional: Negative for fever, chills, diaphoresis, activity change, appetite change and fatigue.  HENT: Negative for ear pain, nosebleeds, congestion, facial swelling, rhinorrhea, neck pain, neck stiffness and ear discharge.   Eyes: Negative for pain, discharge, redness, itching and visual disturbance.  Respiratory: Negative for cough, choking, chest tightness, shortness of breath, wheezing and stridor.   Cardiovascular: Negative for chest pain, palpitations and leg swelling.  Gastrointestinal: Negative for abdominal distention.  Genitourinary: Negative for dysuria, urgency, frequency, hematuria, flank pain, decreased urine volume, difficulty urinating and dyspareunia.  Musculoskeletal: Negative for back pain, joint swelling, arthralgias and gait problem.    Neurological: Negative for dizziness, tremors, seizures, syncope, facial asymmetry, speech difficulty, weakness, light-headedness, numbness and headaches.  Hematological: Negative for adenopathy. Does not bruise/bleed easily.  Psychiatric/Behavioral: Negative for hallucinations, behavioral problems, confusion, dysphoric mood, decreased concentration and agitation.   Past Medical History  Diagnosis Date  . Anxiety and depression 1995    symptoms started aprox 1995, after a divorce  . Hyperlipidemia     high TG  . Hypertension   . Neuropathy (HCC)   . 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 History of alcohol abuse   . 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 Family history of anesthesia complication     "father would start seeing things"  . Anxiety   . Arthritis   . Ulcer of esophagus   . Heart murmur     as a baby " outgrew" " small one per Dr Drue Novel.  . PVD (peripheral vascular disease) (HCC)     PVD, mild saw Dr Allyson Sabal 2012, intolerant to pletal, Rx observation  . History of blood transfusion   . Anemia     Past Surgical History  Procedure Laterality Date  . Cholecystectomy    . Tonsillectomy    . Amputation Left 11/20/13 and 11/24/13    middle  . Amputation Left 01/03/2014    Procedure: Left Transmetatarsal Amputation;  Surgeon: Nadara Mustard, MD;  Location: [facility] OR;  Service: Orthopedics;  Laterality: Left;  . Transmetatarsal amputation Left 10/2013  . Colonoscopy    . Amputation Right 07/27/2014    Procedure: Right 4th Ray Amputation;  Surgeon: Nadara Mustard, MD;  Location: [facility]  OR;  Service: Orthopedics;  Laterality: Right;  . Esophagogastroduodenoscopy N/A 08/16/2014    Procedure: ESOPHAGOGASTRODUODENOSCOPY (EGD);  Surgeon: Vida RiggerMarc Magod, MD;  [facility];  Service: Endoscopy;  Laterality: N/A;  . Esophagogastroduodenoscopy (egd) with propofol N/A 08/17/2014    Procedure: ESOPHAGOGASTRODUODENOSCOPY (EGD) WITH PROPOFOL;  Surgeon:  Graylin ShiverSalem F Ganem, MD;  [facility];  Service: Endoscopy;  Laterality: N/A;  . Toe amputation 4th r  07-27-2014  . Transmetatarsal amputation Right 07/06/02016  . Amputation Right 09/12/2014    Procedure: RIGHT TRANSMETATARSAL AMPUTATION;  Surgeon: Nadara MustardMarcus Duda V, MD;  [facility];  Service: Orthopedics;  Laterality: Right;  . Amputation Right 09/28/2014    Procedure: Right Below Knee Amputation;  Surgeon: Nadara MustardMarcus Duda V, MD;  [facility];  Service: Orthopedics;  Laterality: Right;    Social history:  reports that he has never smoked. He has never used smokeless tobacco. He reports that he drinks about 2.4 oz of alcohol per week. He reports that he does not use illicit drugs.  Ambulation ambulates without assistance   No Known Allergies  Family History  Problem Relation Age of Onset  . Hyperlipidemia Mother   . Lung cancer Father     asbestos releated  . Colon polyps Father   . Diabetes Neg Hx   . Coronary artery disease Neg Hx   . Prostate cancer Neg Hx     Prior to Admission medications   Medication Sig Start Date End Date Taking? Authorizing Provider  acetaminophen (TYLENOL) 325 MG tablet Take 650 mg by mouth every 6 (six) hours as needed (pain). Reported on 08/27/2015   Yes Historical Provider, MD  Alpha-Lipoic Acid 600 MG CAPS Take 1 capsule by mouth daily.   Yes Historical Provider, MD  ALPRAZolam Prudy Feeler(XANAX) 0.5 MG tablet Take 1 tablet (0.5 mg total) by mouth 3 (three) times daily. 07/09/15  Yes Wanda PlumpJose E Paz, MD  amLODipine (NORVASC) 5 MG tablet Take 1 tablet (5 mg total) by mouth daily. 01/07/15  Yes Wanda PlumpJose E Paz, MD  atorvastatin (LIPITOR) 40 MG tablet Take 1 tablet (40 mg total) by mouth at bedtime. 01/07/15  Yes Wanda PlumpJose E Paz, MD  b complex vitamins capsule Take 2 capsules by mouth daily.    Yes Historical Provider, MD  ciprofloxacin (CIPRO) 500 MG tablet Take 1 tablet (500 mg total) by mouth 2 (two) times daily. 08/26/15  Yes Vivi BarrackMatthew R Wagoner, DPM  citalopram (CELEXA) 20 MG  tablet Take 2 tablets (40 mg total) by mouth daily. 03/05/15  Yes Wanda PlumpJose E Paz, MD  doxycycline (VIBRA-TABS) 100 MG tablet Take 1 tablet (100 mg total) by mouth 2 (two) times daily. 08/26/15  Yes Vivi BarrackMatthew R Wagoner, DPM  fenofibrate 160 MG tablet Take 1 tablet (160 mg total) by mouth daily. 01/07/15  Yes Wanda PlumpJose E Paz, MD  Krill Oil 1000 MG CAPS Take 4,000 mg by mouth daily.    Yes Historical Provider, MD  losartan (COZAAR) 100 MG tablet Take 1 tablet (100 mg total) by mouth daily. 01/07/15  Yes Wanda PlumpJose E Paz, MD  oxymetazoline (AFRIN) 0.05 % nasal spray Place 1 spray into both nostrils 2 (two) times daily as needed for congestion. Reported on 08/27/2015   Yes Historical Provider, MD  pregabalin (LYRICA) 75 MG capsule Take 75 mg by mouth 2 (two) times daily.   Yes Historical Provider, MD  Probiotic Product (PROBIOTIC DAILY) CAPS Take 1 capsule by mouth daily.   Yes Historical Provider, MD  Propylene Glycol (SYSTANE BALANCE OP) Apply  1 drop to eye 2 (two) times daily as needed (dry eyes). Reported on 08/27/2015   Yes Historical Provider, MD  Psyllium (METAMUCIL PO) Take 2 capsules by mouth daily.   Yes Historical Provider, MD  RA ASPIRIN EC 81 MG EC tablet Take 81 mg by mouth daily. 12/06/14  Yes Historical Provider, MD  tamsulosin (FLOMAX) 0.4 MG CAPS capsule Take 1 capsule (0.4 mg total) by mouth daily. 02/08/15  Yes [name], MD  traZODone (DESYREL) 50 MG tablet Take 1 tablet (50 mg total) by mouth at bedtime. 08/06/15  Yes Bradd Canary, MD  Vitamins C E (VITAMIN C & E COMBINATION PO) Take 3,000 mg by mouth daily.    Yes Historical Provider, MD    Physical Exam: Filed Vitals:   08/28/15 1445 08/28/15 1515 08/28/15 1530 08/28/15 1621  BP: 128/75 135/71 132/73 127/74  Pulse: 69 75 73 67  Temp:      TempSrc:      Resp:    18  Height:      Weight:      SpO2: 93% 96% 93% 96%    Constitutional: NAD, calm, comfortable Filed Vitals:   08/28/15 1445 08/28/15 1515 08/28/15 1530 08/28/15 1621  BP: 128/75  135/71 132/73 127/74  Pulse: 69 75 73 67  Temp:      TempSrc:      Resp:    18  Height:      Weight:      SpO2: 93% 96% 93% 96%   Eyes: PERRL, lids and conjunctivae normal ENMT: Mucous membranes are moist. Posterior pharynx clear of any exudate or lesions.Normal dentition.  Neck: normal, supple, no masses, no thyromegaly Respiratory: clear to auscultation bilaterally, no wheezing, no crackles. Normal respiratory effort. No accessory muscle use.  Cardiovascular: Regular rate and rhythm, no rubs / gallops. No extremity edema. 2+ pedal pulses. No carotid bruits. Murmur appreciated +2/6 Abdomen: no tenderness, no masses palpated. No hepatosplenomegaly. Bowel sounds positive.  Musculoskeletal: no clubbing / cyanosis. No joint deformity upper and lower extremities. Good ROM, no contractures. Normal muscle tone.  Skin: right BKA, left foot ulcer draining, transmetatarsal amputation left foot. Slight drainage for the area of 1st metatarsal of the left foot, erythema of that area  Neurologic: CN 2-12 grossly intact. Sensation intact, DTR normal. Strength 5/5 in all 4.  Psychiatric: Normal judgment and insight. Alert and oriented x 3. Normal mood.    Labs on Admission: I have personally reviewed following labs and imaging studies  CBC:  Recent Labs Lab 08/26/15 1550 08/28/15 1337  WBC 6.5 5.5  NEUTROABS 4485 3.9  HGB 13.8 13.1  HCT 40.3 39.4  MCV 92.9 90.0  PLT 217 208   Basic Metabolic Panel:  Recent Labs Lab 08/26/15 1550 08/28/15 1337  NA 141 139  K 4.1 4.1  CL 110 111  CO2 18* 18*  GLUCOSE 82 93  BUN 28* 20  CREATININE 1.11 1.06  CALCIUM 9.3 9.5   GFR: Estimated Creatinine Clearance: 62.1 mL/min (by C-G formula based on Cr of 1.06). Liver Function Tests:  Recent Labs Lab 08/27/15 1033  AST 16  ALT 12   No results for input(s): LIPASE, AMYLASE in the last 168 hours. No results for input(s): AMMONIA in the last 168 hours. Coagulation Profile: No results for  input(s): INR, PROTIME in the last 168 hours. Cardiac Enzymes: No results for input(s): CKTOTAL, CKMB, CKMBINDEX, TROPONINI in the last 168 hours. BNP (last 3 results) No results for input(s): PROBNP  in the last 8760 hours. HbA1C:  Recent Labs  08/27/15 1033  HGBA1C 5.7   CBG: No results for input(s): GLUCAP in the last 168 hours. Lipid Profile:  Recent Labs  08/27/15 1033  CHOL 124  HDL 32.90*  LDLCALC 63  TRIG 140.0  CHOLHDL 4   Thyroid Function Tests: No results for input(s): TSH, T4TOTAL, FREET4, T3FREE, THYROIDAB in the last 72 hours. Anemia Panel: No results for input(s): VITAMINB12, FOLATE, FERRITIN, TIBC, IRON, RETICCTPCT in the last 72 hours. Urine analysis:    Component Value Date/Time   COLORURINE YELLOW 01/07/2015 1508   APPEARANCEUR Cloudy* 01/07/2015 1508   LABSPEC 1.025 01/07/2015 1508   PHURINE 6.0 01/07/2015 1508   GLUCOSEU NEGATIVE 01/07/2015 1508   GLUCOSEU NEGATIVE 08/15/2014 1738   HGBUR NEGATIVE 01/07/2015 1508   BILIRUBINUR NEGATIVE 01/07/2015 1508   KETONESUR NEGATIVE 01/07/2015 1508   PROTEINUR NEGATIVE 08/15/2014 1738   UROBILINOGEN 0.2 01/07/2015 1508   NITRITE NEGATIVE 01/07/2015 1508   LEUKOCYTESUR NEGATIVE 01/07/2015 1508   Sepsis Labs: @LABRCNTIP (procalcitonin:4,lacticidven:4)   Recent Results (from the past 240 hour(s))  Culture, blood (Routine X 2) w Reflex to ID Panel     Status: None (Preliminary result)   Collection Time: 08/28/15  1:40 PM  Result Value Ref Range Status   Specimen Description BLOOD RIGHT ANTECUBITAL  Final   Special Requests BOTTLES DRAWN AEROBIC AND ANAEROBIC  5CC  Final   Culture PENDING  Incomplete   Report Status PENDING  Incomplete     Radiological Exams on Admission: No results found.  EKG: Independently reviewed. Not done on admission   Assessment/Plan  Principal Problem:   Foot ulcer, left (HCC) - Cellulitis admission order set utilized - Severity classifies moderate to sever and choice  of abx is cefepime and flagyl - Culture from the ulcer collected by ED physician, will follow up on results  - Dr. Lajoyce Corners to see the pt in consultation  Active Problems:   Anxiety and depression - Resume xanax, celexa    HTN (hypertension), essential - Continue Norvasc 5 mg daily     Peripheral neuropathy (HCC) - Continue lyrica and celexa    PVD (peripheral vascular disease) (HCC) / S/P transmetatarsal amputation of foot (HCC) / right BKA - Now with foot ulcer on the left, managed conservatively at this point    Dyslipidemia - Continue Lipitor 40 mg at bedtime    DVT prophylaxis: SCD's bilaterally  Code Status: full code  Family Communication: no family at the bedside  Disposition Plan: admission for management of diabetic foot ulcer  Consults called: Dr. Aldean Baker, called by ED physician  Admission status: Inpatient    Manson Passey MD [facility] Pager [phone]  If 7PM-7AM, please contact night-coverage [email or web address] Password [facility]  08/28/2015, 4:33 PM

## 2015-08-28 NOTE — Telephone Encounter (Signed)
thx

## 2015-08-29 ENCOUNTER — Observation Stay (HOSPITAL_BASED_OUTPATIENT_CLINIC_OR_DEPARTMENT_OTHER): Payer: Medicare Other

## 2015-08-29 DIAGNOSIS — L97509 Non-pressure chronic ulcer of other part of unspecified foot with unspecified severity: Secondary | ICD-10-CM

## 2015-08-29 DIAGNOSIS — L03116 Cellulitis of left lower limb: Secondary | ICD-10-CM

## 2015-08-29 DIAGNOSIS — Z89432 Acquired absence of left foot: Secondary | ICD-10-CM

## 2015-08-29 DIAGNOSIS — E11621 Type 2 diabetes mellitus with foot ulcer: Secondary | ICD-10-CM | POA: Diagnosis present

## 2015-08-29 LAB — HIV ANTIBODY (ROUTINE TESTING W REFLEX): HIV SCREEN 4TH GENERATION: NONREACTIVE

## 2015-08-29 LAB — CBC
HEMATOCRIT: 36.8 % — AB (ref 39.0–52.0)
Hemoglobin: 12 g/dL — ABNORMAL LOW (ref 13.0–17.0)
MCH: 29.8 pg (ref 26.0–34.0)
MCHC: 32.6 g/dL (ref 30.0–36.0)
MCV: 91.3 fL (ref 78.0–100.0)
PLATELETS: 199 10*3/uL (ref 150–400)
RBC: 4.03 MIL/uL — AB (ref 4.22–5.81)
RDW: 13.3 % (ref 11.5–15.5)
WBC: 5.3 10*3/uL (ref 4.0–10.5)

## 2015-08-29 LAB — COMPREHENSIVE METABOLIC PANEL
ALBUMIN: 2.9 g/dL — AB (ref 3.5–5.0)
ALT: 14 U/L — AB (ref 17–63)
AST: 16 U/L (ref 15–41)
Alkaline Phosphatase: 28 U/L — ABNORMAL LOW (ref 38–126)
Anion gap: 8 (ref 5–15)
BUN: 16 mg/dL (ref 6–20)
CHLORIDE: 112 mmol/L — AB (ref 101–111)
CO2: 19 mmol/L — AB (ref 22–32)
CREATININE: 0.94 mg/dL (ref 0.61–1.24)
Calcium: 8.8 mg/dL — ABNORMAL LOW (ref 8.9–10.3)
GFR calc Af Amer: >60 mL/min (ref >60)
GFR calc non Af Amer: >60 mL/min (ref >60)
Glucose, Bld: 82 mg/dL (ref 65–99)
POTASSIUM: 3.6 mmol/L (ref 3.5–5.1)
SODIUM: 139 mmol/L (ref 135–145)
Total Bilirubin: 0.9 mg/dL (ref 0.3–1.2)
Total Protein: 5.4 g/dL — ABNORMAL LOW (ref 6.5–8.1)

## 2015-08-29 LAB — HEMOGLOBIN A1C
Hgb A1c MFr Bld: 5.5 % (ref 4.8–5.6)
Mean Plasma Glucose: 111 mg/dL

## 2015-08-29 MED ORDER — JUVEN PO PACK
1.0000 | PACK | Freq: Two times a day (BID) | ORAL | Status: DC
  Administered 2015-08-29 – 2015-08-31 (×3): 1 via ORAL
  Filled 2015-08-29 (×7): qty 1

## 2015-08-29 MED ORDER — ENOXAPARIN SODIUM 40 MG/0.4ML ~~LOC~~ SOLN
40.0000 mg | SUBCUTANEOUS | Status: DC
  Filled 2015-08-29 (×4): qty 0.4

## 2015-08-29 NOTE — Care Management Obs Status (Signed)
MEDICARE OBSERVATION STATUS NOTIFICATION   Patient Details  Name: Juluis PitchWilliam W Straub III MRN: [id number] Date of Birth: Jul 07, 1940   Medicare Observation Status Notification Given:  Yes   CC 44 paperwork reviewed with patient   Lawerance SabalDebbie Mariette Cowley, RN 08/29/2015, 1:38 PM

## 2015-08-29 NOTE — Care Management Note (Signed)
Case Management Note  Patient Details  Name: Juluis PitchWilliam W Cadenhead III MRN: [id number] Date of Birth: 08-Sep-1940  Subjective/Objective:                 Independent patient admitted from home, lives alone. Hx of R BKA with prosthesis now with L foot ulcer seen in Dr Audrie Liauda's office two days ago per his report.    Action/Plan:  Will continue to follow for DC planning  Expected Discharge Date:                  Expected Discharge Plan:  Home/Self Care  In-House Referral:     Discharge planning Services  CM Consult  Post Acute Care Choice:    Choice offered to:     DME Arranged:    DME Agency:     HH Arranged:    HH Agency:     Status of Service:  In process, will continue to follow  If discussed at Long Length of Stay Meetings, dates discussed:    Additional Comments:  Lawerance SabalDebbie Collin Rengel, RN 08/29/2015, 2:22 PM

## 2015-08-29 NOTE — Progress Notes (Addendum)
Patient ID: Gene [name], male   DOB: 1940/09/08, 75 y.o.   MRN: [id number]  PROGRESS NOTE    Gene [name]  WUJ:[id number] DOB: 01-28-1941 DOA: 08/28/2015  PCP: [name], MD   Brief Narrative:  75 y.o. male with medical history significant for peripheral vascular disease, right BKA and left transmetatarsal amputation, dyslipidemia and hypertension who presented to UC because of swelling in left foot along the area where toes were amputated. He was on antibiotics given to him by PCP, Cipro and doxycycline but no significant improvement. He was seen by orthopedic surgery in consultation. His MRI doesn't reveal evidence of osteomyelitis.    Assessment & Plan:  Principal Problem:  Foot ulcer, left (HCC) - Cellulitis admission order set utilized - Severity classifies moderate to sever and choice of abx is cefepime and flagyl - MRI of the foot showed transmetatarsal amputation, ill-defined fluid collection around plantar and distal aspect of the first metatarsal, mild underlying marrow edema within the first metatarsal adjacent to this fluid collection without destruction but cannot exclude early osteomyelitis - Pt seen by Dr. Lajoyce Corners, no surgical intervention planned   Active Problems:  Anxiety and depression - Continue xanax, celexa   HTN (hypertension), essential - Continue Norvasc 5 mg daily    Peripheral neuropathy (HCC) - Continue lyrica and celexa   PVD (peripheral vascular disease) (HCC) / S/P transmetatarsal amputation of foot (HCC) / right BKA - Now with foot ulcer on the left, managed conservatively at this point   Dyslipidemia - Continue Lipitor 40 mg at bedtime    DVT prophylaxis: Lovenox subQ Code Status: full code  Family Communication: family not at the bedside this am  Disposition Plan: home over the weekend, needs 2-3 days on IV abx   Consultants:   Orthopedic surgery, Dr. Lajoyce Corners   Procedures:   None   Antimicrobials:   Cefepime and  flagyl 08/28/2015 -->    Subjective: Feels okay this am.   Objective: Filed Vitals:   08/28/15 2210 08/29/15 0001 08/29/15 0206 08/29/15 0445  BP: 126/53 115/57 125/58 135/62  Pulse: 68 67 69 74  Temp: 98.6 F (37 C)  98.2 F (36.8 C) 98.5 F (36.9 C)  TempSrc: Oral  Oral Oral  Resp: Height:      Weight:      SpO2: 97%  98% 96%    Intake/Output Summary (Last 24 hours) at 08/29/15 0820 Last data filed at 08/29/15 0731  Gross per 24 hour  Intake 264.17 ml  Output    550 ml  Net -285.83 ml   Filed Weights   08/28/15 1050  Weight: 81.647 kg (180 lb)    Examination:  General exam: Appears calm and comfortable  Respiratory system: Clear to auscultation. Respiratory effort normal. Cardiovascular system: S1 & S2 heard, RRR. Murmur appreciated +2/6 Gastrointestinal system: Abdomen is nondistended, soft and nontender. No organomegaly or masses felt. Normal bowel sounds heard. Central nervous system: Alert and oriented. No focal neurological deficits. Extremities: right BKA, left transmetatarsal amputation and erythema over the area of 1st metatarsal but no drainage actively at this time  Skin: warm, dry  Psychiatry: Judgement and insight appear normal. Mood & affect appropriate.   Data Reviewed: I have personally reviewed following labs and imaging studies  CBC:  Recent Labs Lab 08/26/15 1550 08/28/15 1337 08/28/15 2031 08/29/15 0552  WBC 6.5 5.5 5.0 5.3  NEUTROABS 4485 3.9 3.1  --   HGB 13.8 13.1 13.2 12.0*  HCT 40.3 39.4 39.1 36.8*  MCV 92.9 90.0 89.7 91.3  PLT 217 208 226 199   Basic Metabolic Panel:  Recent Labs Lab 08/26/15 1550 08/28/15 1337 08/28/15 2031 08/29/15 0552  NA 141 139 138 139  K 4.1 4.1 3.6 3.6  CL 110 111 109 112*  CO2 18* 18* 18* 19*  GLUCOSE 82 93 107* 82  BUN 28* 20 17 16   CREATININE 1.11 1.06 1.03 0.94  CALCIUM 9.3 9.5 9.3 8.8*   GFR: Estimated Creatinine Clearance: 70 mL/min (by C-G formula based on Cr of  0.94). Liver Function Tests:  Recent Labs Lab 08/27/15 1033 08/29/15 0552  AST 16 16  ALT 12 14*  ALKPHOS  --  28*  BILITOT  --  0.9  PROT  --  5.4*  ALBUMIN  --  2.9*   No results for input(s): LIPASE, AMYLASE in the last 168 hours. No results for input(s): AMMONIA in the last 168 hours. Coagulation Profile: No results for input(s): INR, PROTIME in the last 168 hours. Cardiac Enzymes: No results for input(s): CKTOTAL, CKMB, CKMBINDEX, TROPONINI in the last 168 hours. BNP (last 3 results) No results for input(s): PROBNP in the last 8760 hours. HbA1C:  Recent Labs  08/27/15 1033 08/28/15 1959  HGBA1C 5.7 5.5   CBG: No results for input(s): GLUCAP in the last 168 hours. Lipid Profile:  Recent Labs  08/27/15 1033  CHOL 124  HDL 32.90*  LDLCALC 63  TRIG 140.0  CHOLHDL 4   Thyroid Function Tests: No results for input(s): TSH, T4TOTAL, FREET4, T3FREE, THYROIDAB in the last 72 hours. Anemia Panel: No results for input(s): VITAMINB12, FOLATE, FERRITIN, TIBC, IRON, RETICCTPCT in the last 72 hours. Urine analysis:    Component Value Date/Time   COLORURINE YELLOW 01/07/2015 1508   APPEARANCEUR Cloudy* 01/07/2015 1508   LABSPEC 1.025 01/07/2015 1508   PHURINE 6.0 01/07/2015 1508   GLUCOSEU NEGATIVE 01/07/2015 1508   GLUCOSEU NEGATIVE 08/15/2014 1738   HGBUR NEGATIVE 01/07/2015 1508   BILIRUBINUR NEGATIVE 01/07/2015 1508   KETONESUR NEGATIVE 01/07/2015 1508   PROTEINUR NEGATIVE 08/15/2014 1738   UROBILINOGEN 0.2 01/07/2015 1508   NITRITE NEGATIVE 01/07/2015 1508   LEUKOCYTESUR NEGATIVE 01/07/2015 1508   Sepsis Labs: @LABRCNTIP (procalcitonin:4,lacticidven:4)   Recent Results (from the past 240 hour(s))  Culture, blood (Routine X 2) w Reflex to ID Panel     Status: None (Preliminary result)   Collection Time: 08/28/15  1:40 PM  Result Value Ref Range Status   Specimen Description BLOOD RIGHT ANTECUBITAL  Final   Special Requests BOTTLES DRAWN AEROBIC AND  ANAEROBIC  5CC  Final   Culture PENDING  Incomplete   Report Status PENDING  Incomplete      Radiology Studies: Mr Foot Left Wo Contrast 08/28/2015  1. Status post transmetatarsal amputation. 2. There is an ill-defined fluid collection draping around the plantar and distal aspect of the remaining first metatarsal. This may reflect soft tissue infection, especially if this is the area of the draining wound. Alternatively, this could reflect a pseudo bursa. 3. There is mild underlying marrow edema within the first metatarsal adjacent to this fluid collection without cortical destruction. Cannot exclude early osteomyelitis. 4. Nonspecific prominent subcutaneous edema surrounding the ankle.    Scheduled Meds: . acidophilus  1 capsule Oral Daily  . Gene [name]  0.5 mg Oral TID  . amLODipine  5 mg Oral Daily  . aspirin EC  81 mg Oral Daily  . atorvastatin  40 mg Oral QHS  .  B-complex with vitamin C  1 tablet Oral Daily  . ceFEPime (MAXIPIME) IV  2 g Intravenous Q12H  . citalopram  40 mg Oral Daily  . fenofibrate  160 mg Oral Daily  . losartan  100 mg Oral Daily  . metronidazole  500 mg Intravenous Q8H  . pregabalin  75 mg Oral BID  . psyllium  1 packet Oral Daily  . tamsulosin  0.4 mg Oral Daily  . traZODone  50 mg Oral QHS  . vancomycin  750 mg Intravenous Q12H   Continuous Infusions:    LOS: 1 day    Time spent: 25 minutes  Greater than 50% of the time spent on counseling and coordinating the care.   Manson PasseyEVINE, ALMA, MD [facility] Pager [phone]  If 7PM-7AM, please contact night-coverage [email or web address] Password [email or web address] 08/29/2015, 8:20 AM
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
## 2015-08-29 NOTE — Progress Notes (Signed)
VASCULAR LAB PRELIMINARY  ARTERIAL  ABI completed: Left ABI non compressible.  Can compare to previous ABI 11/17/13 also non compressible.    RIGHT    LEFT    PRESSURE WAVEFORM  PRESSURE WAVEFORM  BRACHIAL 130 Tri BRACHIAL 140 Tri  DP   DP    AT N/A BKA  AT 254 Tri  PT   PT 154 Mono  PER   PER    GREAT TOE  NA GREAT TOE  NA    RIGHT LEFT  ABI N/A BKA Non compressible     Gene [name], RDMS, RVT  08/29/2015, 2:27 PM
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
## 2015-08-29 NOTE — Consult Note (Addendum)
WOC consult requested prior to ortho service involvement.  Dr Lajoyce Cornersuda is now following for assessment and plan of care.  Left stump currently without open wound or edema, previously noted erythremia is receeding.  No topical treatment indicated at this time.  Please refer to this team for further questions. Please re-consult if further assistance is needed.  Thank-you,  Cammie Mcgeeawn Jaid Quirion MSN, RN, CWOCN, [place]-AP, CNS [phone]

## 2015-08-30 ENCOUNTER — Inpatient Hospital Stay (HOSPITAL_COMMUNITY): Admission: RE | Admit: 2015-08-30 | Payer: Medicare Other | Source: Ambulatory Visit

## 2015-08-30 NOTE — Progress Notes (Addendum)
Patient ID: Gene [name], male   DOB: Mar 08, 1941, 75 y.o.   MRN: [id number]  PROGRESS NOTE    [name]  UVO:[id number] DOB: 1941/01/07 DOA: 08/28/2015  PCP: [name], MD   Brief Narrative:  75 y.o. male with medical history significant for peripheral vascular disease, right BKA and left transmetatarsal amputation, dyslipidemia and hypertension who presented to UC because of swelling in left foot along the area where toes were amputated. He was on antibiotics given to him by PCP, Cipro and doxycycline but no significant improvement. He was seen by orthopedic surgery in consultation. His MRI doesn't reveal evidence of osteomyelitis.    Assessment & Plan:  Principal Problem:  Foot ulcer, left (HCC) - Continue cefepime and flagyl - MRI of the foot showed transmetatarsal amputation, ill-defined fluid collection around plantar and distal aspect of the first metatarsal, mild underlying marrow edema within the first metatarsal adjacent to this fluid collection without destruction but cannot exclude early osteomyelitis - Pt seen by Dr. Lajoyce Corners, no surgical intervention planned at this time - Once erythema better, we can switch to PO abx and he can then follow up with Dr. Lajoyce Corners on outpatient basis   Active Problems:  Anxiety and depression - Continue xanax, celexa   HTN (hypertension), essential - Continue Norvasc 5 mg daily    Peripheral neuropathy (HCC) - Continue lyrica and celexa   PVD (peripheral vascular disease) (HCC) / S/P transmetatarsal amputation of foot (HCC) / right BKA - Managed conservatively at this point   Dyslipidemia - Continue Lipitor 40 mg at bedtime    DVT prophylaxis: Lovenox subQ Code Status: full code  Family Communication: family not at the bedside this am  Disposition Plan: home in 1-2 days if erythema further improving    Consultants:   Orthopedic surgery, Dr. Lajoyce Corners   Procedures:   None   Antimicrobials:   Cefepime and flagyl  08/28/2015 -->    Subjective: No overnight events.   Objective: Filed Vitals:   08/29/15 0445 08/29/15 1357 08/29/15 2127 08/30/15 0522  BP: 135/62 121/67 127/60 128/63  Pulse: 74 72 69 70  Temp: 98.5 F (36.9 C) 98.3 F (36.8 C) 98.3 F (36.8 C) 98.6 F (37 C)  TempSrc: Oral Oral Oral Oral  Resp: Height:      Weight:      SpO2: 96% 95% 95% 95%    Intake/Output Summary (Last 24 hours) at 08/30/15 0641 Last data filed at 08/30/15 0631  Gross per 24 hour  Intake    980 ml  Output   1200 ml  Net   -220 ml   Filed Weights   08/28/15 1050  Weight: 81.647 kg (180 lb)    Examination:  General exam: No acute distress   Respiratory system: No wheezing, no rhonchi  Cardiovascular system: S1 & S2 heard, RRR. Murmur appreciated along sternal border +2/6 Gastrointestinal system: (+) BS, non tender  Central nervous system: No focal neurological deficits. Extremities: right BKA, left transmetatarsal amputation and erythema over the area of 1st metatarsal, improving significantly,slight erythema on dorsum Skin: as noted above, erythema of left foot localized to metatarsal area  Psychiatry: Normal mood and behavior   Data Reviewed: I have personally reviewed following labs and imaging studies  CBC:  Recent Labs Lab 08/26/15 1550 08/28/15 1337 08/28/15 2031 08/29/15 0552  WBC 6.5 5.5 5.0 5.3  NEUTROABS 4485 3.9 3.1  --   HGB 13.8 13.1 13.2 12.0*  HCT  40.3 39.4 39.1 36.8*  MCV 92.9 90.0 89.7 91.3  PLT 217 208 226 199   Basic Metabolic Panel:  Recent Labs Lab 08/26/15 1550 08/28/15 1337 08/28/15 2031 08/29/15 0552  NA 141 139 138 139  K 4.1 4.1 3.6 3.6  CL 110 111 109 112*  CO2 18* 18* 18* 19*  GLUCOSE 82 93 107* 82  BUN 28* 20 17 16   CREATININE 1.11 1.06 1.03 0.94  CALCIUM 9.3 9.5 9.3 8.8*   GFR: Estimated Creatinine Clearance: 70 mL/min (by C-G formula based on Cr of 0.94). Liver Function Tests:  Recent Labs Lab 08/27/15 1033  08/29/15 0552  AST 16 16  ALT 12 14*  ALKPHOS  --  28*  BILITOT  --  0.9  PROT  --  5.4*  ALBUMIN  --  2.9*   No results for input(s): LIPASE, AMYLASE in the last 168 hours. No results for input(s): AMMONIA in the last 168 hours. Coagulation Profile: No results for input(s): INR, PROTIME in the last 168 hours. Cardiac Enzymes: No results for input(s): CKTOTAL, CKMB, CKMBINDEX, TROPONINI in the last 168 hours. BNP (last 3 results) No results for input(s): PROBNP in the last 8760 hours. HbA1C:  Recent Labs  08/27/15 1033 08/28/15 1959  HGBA1C 5.7 5.5   CBG: No results for input(s): GLUCAP in the last 168 hours. Lipid Profile:  Recent Labs  08/27/15 1033  CHOL 124  HDL 32.90*  LDLCALC 63  TRIG 140.0  CHOLHDL 4   Thyroid Function Tests: No results for input(s): TSH, T4TOTAL, FREET4, T3FREE, THYROIDAB in the last 72 hours. Anemia Panel: No results for input(s): VITAMINB12, FOLATE, FERRITIN, TIBC, IRON, RETICCTPCT in the last 72 hours. Urine analysis:    Component Value Date/Time   COLORURINE YELLOW 01/07/2015 1508   APPEARANCEUR Cloudy* 01/07/2015 1508   LABSPEC 1.025 01/07/2015 1508   PHURINE 6.0 01/07/2015 1508   GLUCOSEU NEGATIVE 01/07/2015 1508   GLUCOSEU NEGATIVE 08/15/2014 1738   HGBUR NEGATIVE 01/07/2015 1508   BILIRUBINUR NEGATIVE 01/07/2015 1508   KETONESUR NEGATIVE 01/07/2015 1508   PROTEINUR NEGATIVE 08/15/2014 1738   UROBILINOGEN 0.2 01/07/2015 1508   NITRITE NEGATIVE 01/07/2015 1508   LEUKOCYTESUR NEGATIVE 01/07/2015 1508   Sepsis Labs: @LABRCNTIP (procalcitonin:4,lacticidven:4)   Recent Results (from the past 240 hour(s))  Culture, blood (Routine X 2) w Reflex to ID Panel     Status: None (Preliminary result)   Collection Time: 08/28/15  1:38 PM  Result Value Ref Range Status   Specimen Description BLOOD LEFT FOREARM  Final   Special Requests BOTTLES DRAWN AEROBIC ONLY  6CC  Final   Culture NO GROWTH 1 DAY  Final   Report Status PENDING   Incomplete  Culture, blood (Routine X 2) w Reflex to ID Panel     Status: None (Preliminary result)   Collection Time: 08/28/15  1:40 PM  Result Value Ref Range Status   Specimen Description BLOOD RIGHT ANTECUBITAL  Final   Special Requests BOTTLES DRAWN AEROBIC AND ANAEROBIC  5CC  Final   Culture NO GROWTH 1 DAY  Final   Report Status PENDING  Incomplete      Radiology Studies: Mr Foot Left Wo Contrast 08/28/2015  1. Status post transmetatarsal amputation. 2. There is an ill-defined fluid collection draping around the plantar and distal aspect of the remaining first metatarsal. This may reflect soft tissue infection, especially if this is the area of the draining wound. Alternatively, this could reflect a pseudo bursa. 3. There is mild underlying marrow  edema within the first metatarsal adjacent to this fluid collection without cortical destruction. Cannot exclude early osteomyelitis. 4. Nonspecific prominent subcutaneous edema surrounding the ankle.    Scheduled Meds: . acidophilus  1 capsule Oral Daily  . ALPRAZolam  0.5 mg Oral TID  . amLODipine  5 mg Oral Daily  . aspirin EC  81 mg Oral Daily  . atorvastatin  40 mg Oral QHS  . B-complex with vitamin C  1 tablet Oral Daily  . ceFEPime (MAXIPIME) IV  2 g Intravenous Q12H  . citalopram  40 mg Oral Daily  . enoxaparin (LOVENOX) injection  40 mg Subcutaneous Q24H  . fenofibrate  160 mg Oral Daily  . losartan  100 mg Oral Daily  . metronidazole  500 mg Intravenous Q8H  . nutrition supplement (JUVEN)  1 packet Oral BID BM  . pregabalin  75 mg Oral BID  . psyllium  1 packet Oral Daily  . tamsulosin  0.4 mg Oral Daily  . traZODone  50 mg Oral QHS  . vancomycin  750 mg Intravenous Q12H   Continuous Infusions:    LOS: 2 days    Time spent: 25 minutes  Greater than 50% of the time spent on counseling and coordinating the care.   Manson PasseyEVINE, Airel Magadan, MD [facility] Pager [phone]  If 7PM-7AM, please contact  night-coverage [email or web address] Password [email or web address] 08/30/2015, 6:41 AM

## 2015-08-31 LAB — CBC
HEMATOCRIT: 37.7 % — AB (ref 39.0–52.0)
Hemoglobin: 12.4 g/dL — ABNORMAL LOW (ref 13.0–17.0)
MCH: 29.5 pg (ref 26.0–34.0)
MCHC: 32.9 g/dL (ref 30.0–36.0)
MCV: 89.5 fL (ref 78.0–100.0)
Platelets: 239 10*3/uL (ref 150–400)
RBC: 4.21 MIL/uL — ABNORMAL LOW (ref 4.22–5.81)
RDW: 13.2 % (ref 11.5–15.5)
WBC: 5.6 10*3/uL (ref 4.0–10.5)

## 2015-08-31 LAB — BASIC METABOLIC PANEL
Anion gap: 8 (ref 5–15)
BUN: 14 mg/dL (ref 6–20)
CO2: 18 mmol/L — ABNORMAL LOW (ref 22–32)
Calcium: 8.8 mg/dL — ABNORMAL LOW (ref 8.9–10.3)
Chloride: 108 mmol/L (ref 101–111)
Creatinine, Ser: 0.95 mg/dL (ref 0.61–1.24)
GFR calc Af Amer: >60 mL/min (ref >60)
GLUCOSE: 82 mg/dL (ref 65–99)
POTASSIUM: 3.4 mmol/L — AB (ref 3.5–5.1)
Sodium: 134 mmol/L — ABNORMAL LOW (ref 135–145)

## 2015-08-31 LAB — VANCOMYCIN, TROUGH: Vancomycin Tr: 13 ug/mL (ref 10.0–20.0)

## 2015-08-31 NOTE — Progress Notes (Signed)
Patient ID: Gene [name], male   DOB: Jul 21, 1940, 75 y.o.   MRN: [id number]  PROGRESS NOTE    Iona CoachWilliam W Fries [name]  [id number] DOB: Jul 21, 1940 DOA: 08/28/2015  PCP: Willow OraJose Paz, MD   Brief Narrative:  75 y.o. male with medical history significant for peripheral vascular disease, right BKA and left transmetatarsal amputation, dyslipidemia and hypertension who presented to UC because of swelling in left foot along the area where toes were amputated. He was on antibiotics given to him by PCP, Cipro and doxycycline but no significant improvement. He was seen by orthopedic surgery in consultation. His MRI doesn't reveal evidence of osteomyelitis.    Assessment & Plan:  Principal Problem:  Foot ulcer, left (HCC) - Continue cefepime and flagyl - Spike fever overnight,100.6 F and the area of 1st metatarsal still red so he needs to continue IV abx - MRI of the foot showed transmetatarsal amputation, ill-defined fluid collection around plantar and distal aspect of the first metatarsal, mild underlying marrow edema within the first metatarsal adjacent to this fluid collection without destruction but cannot exclude early osteomyelitis - Pt seen by Dr. Lajoyce Cornersuda, no surgical intervention planned at this time  Active Problems:  Anxiety and depression - Continue xanax, celexa   HTN (hypertension), essential - Continue Norvasc 5 mg daily  - BP 125/73   Peripheral neuropathy (HCC) - Continue lyrica and celexa   PVD (peripheral vascular disease) (HCC) / S/P transmetatarsal amputation of foot (HCC) / right BKA - Managed conservatively at this point   Dyslipidemia - Continue Lipitor 40 mg at bedtime    DVT prophylaxis: Lovenox subQ Code Status: full code  Family Communication: family not at the bedside this am  Disposition Plan: home likely 6/25   Consultants:   Orthopedic surgery, Dr. Lajoyce Cornersuda   Procedures:   None   Antimicrobials:   Cefepime and flagyl 08/28/2015 -->     Subjective: Says he slept good, no pain.  Objective: Filed Vitals:   08/30/15 1411 08/30/15 2147 08/31/15 0526 08/31/15 0528  BP: 121/61 138/70 125/73 125/73  Pulse: 72 77 67 67  Temp: 98.2 F (36.8 C) 98.2 F (36.8 C) 100.6 F (38.1 C) 100.6 F (38.1 C)  TempSrc:   Oral Oral  Resp: 19 18  18   Height:      Weight:      SpO2: 98% 97% 97% 97%    Intake/Output Summary (Last 24 hours) at 08/31/15 0803 Last data filed at 08/31/15 0529  Gross per 24 hour  Intake    540 ml  Output    800 ml  Net   -260 ml   Filed Weights   08/28/15 1050  Weight: 81.647 kg (180 lb)    Examination:  General exam: No distress  Respiratory system: No wheezing, bilateral air entry  Cardiovascular system: S1 & S2 (+), Rate controlled. Murmur appreciated along sternal border +2/6 Gastrointestinal system: (+) BS, obese but not tender   Central nervous system: Nonfocal  Extremities: right BKA, left transmetatarsal amputation and erythema over the area of 1st metatarsal, slightly better. Skin: warm, dry  Psychiatry: Normal mood  Data Reviewed: I have personally reviewed following labs and imaging studies  CBC:  Recent Labs Lab 08/26/15 1550 08/28/15 1337 08/28/15 2031 08/29/15 0552 08/31/15 0601  WBC 6.5 5.5 5.0 5.3 5.6  NEUTROABS 4485 3.9 3.1  --   --   HGB 13.8 13.1 13.2 12.0* 12.4*  HCT 40.3 39.4 39.1 36.8* 37.7*  MCV 92.9 90.0 89.7  91.3 89.5  PLT 217 208 226 199 239   Basic Metabolic Panel:  Recent Labs Lab 08/26/15 1550 08/28/15 1337 08/28/15 2031 08/29/15 0552 08/31/15 0601  NA 141 139 138 139 134*  K 4.1 4.1 3.6 3.6 3.4*  CL 110 111 109 112* 108  CO2 18* 18* 18* 19* 18*  GLUCOSE 82 93 107* 82 82  BUN 28* CREATININE 1.11 1.06 1.03 0.94 0.95  CALCIUM 9.3 9.5 9.3 8.8* 8.8*   GFR: Estimated Creatinine Clearance: 69.3 mL/min (by C-G formula based on Cr of 0.95). Liver Function Tests:  Recent Labs Lab 08/27/15 1033 08/29/15 0552  AST 16 16   ALT 12 14*  ALKPHOS  --  28*  BILITOT  --  0.9  PROT  --  5.4*  ALBUMIN  --  2.9*   No results for input(s): LIPASE, AMYLASE in the last 168 hours. No results for input(s): AMMONIA in the last 168 hours. Coagulation Profile: No results for input(s): INR, PROTIME in the last 168 hours. Cardiac Enzymes: No results for input(s): CKTOTAL, CKMB, CKMBINDEX, TROPONINI in the last 168 hours. BNP (last 3 results) No results for input(s): PROBNP in the last 8760 hours. HbA1C:  Recent Labs  08/28/15 1959  HGBA1C 5.5   CBG: No results for input(s): GLUCAP in the last 168 hours. Lipid Profile: No results for input(s): CHOL, HDL, LDLCALC, TRIG, CHOLHDL, LDLDIRECT in the last 72 hours. Thyroid Function Tests: No results for input(s): TSH, T4TOTAL, FREET4, T3FREE, THYROIDAB in the last 72 hours. Anemia Panel: No results for input(s): VITAMINB12, FOLATE, FERRITIN, TIBC, IRON, RETICCTPCT in the last 72 hours. Urine analysis:    Component Value Date/Time   COLORURINE YELLOW 01/07/2015 1508   APPEARANCEUR Cloudy* 01/07/2015 1508   LABSPEC 1.025 01/07/2015 1508   PHURINE 6.0 01/07/2015 1508   GLUCOSEU NEGATIVE 01/07/2015 1508   GLUCOSEU NEGATIVE 08/15/2014 1738   HGBUR NEGATIVE 01/07/2015 1508   BILIRUBINUR NEGATIVE 01/07/2015 1508   KETONESUR NEGATIVE 01/07/2015 1508   PROTEINUR NEGATIVE 08/15/2014 1738   UROBILINOGEN 0.2 01/07/2015 1508   NITRITE NEGATIVE 01/07/2015 1508   LEUKOCYTESUR NEGATIVE 01/07/2015 1508   Sepsis Labs: (procalcitonin:4,lacticidven:4)   Recent Results (from the past 240 hour(s))  Culture, blood (Routine X 2) w Reflex to ID Panel     Status: None (Preliminary result)   Collection Time: 08/28/15  1:38 PM  Result Value Ref Range Status   Specimen Description BLOOD LEFT FOREARM  Final   Special Requests BOTTLES DRAWN AEROBIC ONLY  6CC  Final   Culture NO GROWTH 2 DAYS  Final   Report Status PENDING  Incomplete  Culture, blood (Routine X 2) w Reflex  to ID Panel     Status: None (Preliminary result)   Collection Time: 08/28/15  1:40 PM  Result Value Ref Range Status   Specimen Description BLOOD RIGHT ANTECUBITAL  Final   Special Requests BOTTLES DRAWN AEROBIC AND ANAEROBIC  5CC  Final   Culture NO GROWTH 2 DAYS  Final   Report Status PENDING  Incomplete      Radiology Studies: Mr Foot Left Wo Contrast 08/28/2015  1. Status post transmetatarsal amputation. 2. There is an ill-defined fluid collection draping around the plantar and distal aspect of the remaining first metatarsal. This may reflect soft tissue infection, especially if this is the area of the draining wound. Alternatively, this could reflect a pseudo bursa. 3. There is mild underlying marrow edema within the first metatarsal adjacent to this  fluid collection without cortical destruction. Cannot exclude early osteomyelitis. 4. Nonspecific prominent subcutaneous edema surrounding the ankle.    Scheduled Meds: . acidophilus  1 capsule Oral Daily  . ALPRAZolam  0.5 mg Oral TID  . amLODipine  5 mg Oral Daily  . aspirin EC  81 mg Oral Daily  . atorvastatin  40 mg Oral QHS  . B-complex with vitamin C  1 tablet Oral Daily  . ceFEPime (MAXIPIME) IV  2 g Intravenous Q12H  . citalopram  40 mg Oral Daily  . enoxaparin (LOVENOX) injection  40 mg Subcutaneous Q24H  . fenofibrate  160 mg Oral Daily  . losartan  100 mg Oral Daily  . metronidazole  500 mg Intravenous Q8H  . nutrition supplement (JUVEN)  1 packet Oral BID BM  . pregabalin  75 mg Oral BID  . psyllium  1 packet Oral Daily  . tamsulosin  0.4 mg Oral Daily  . traZODone  50 mg Oral QHS  . vancomycin  750 mg Intravenous Q12H   Continuous Infusions:    LOS: 3 days    Time spent: 15 minutes  Greater than 50% of the time spent on counseling and coordinating the care.   Manson PasseyEVINE, Tayllor Breitenstein, MD [facility] Pager [phone]  If 7PM-7AM, please contact night-coverage [email or web address] Password [email or web address] 08/31/2015, 8:03 AM

## 2015-08-31 NOTE — Progress Notes (Signed)
Pharmacy Antibiotic Note Iona CoachWilliam W Cope III is a 75 y.o. male admitted on 08/28/2015 with cellulitis of L foot.  Currently on day 3 of Cefepime, Flagyl and vancomycin.   Vancomycin trough obtained today was 13 ( goal 10-15),  SCr 0.95.   Plan: 1. Continue vancomycin 750mg  IV Q12 2. SCr Q 72H while on vancomycin     Height: 5\' 9"  (175.3 cm) Weight: 180 lb (81.647 kg) IBW/kg (Calculated) : 70.7  Temp (24hrs), Avg:99.3 F (37.4 C), Min:97.6 F (36.4 C), Max:100.6 F (38.1 C)   Recent Labs Lab 08/26/15 1550 08/28/15 1337 08/28/15 2031 08/29/15 0552 08/31/15 0601 08/31/15 1226  WBC 6.5 5.5 5.0 5.3 5.6  --   CREATININE 1.11 1.06 1.03 0.94 0.95  --   VANCOTROUGH  --   --   --   --   --  13    Estimated Creatinine Clearance: 69.3 mL/min (by C-G formula based on Cr of 0.95).    No Known Allergies  Antimicrobials this admission: Vancomycin 6/21 >>   Dose adjustments this admission: n/a  Microbiology results: 6/21 BCx: ngtd   Thank you for allowing pharmacy to be a part of this patient's care.   Pollyann SamplesAndy Taela Charbonneau, PharmD, BCPS 08/31/2015, 2:38 PM Pager: [phone]

## 2015-09-01 LAB — CBC
HEMATOCRIT: 39.6 % (ref 39.0–52.0)
Hemoglobin: 13.2 g/dL (ref 13.0–17.0)
MCH: 29.9 pg (ref 26.0–34.0)
MCHC: 33.3 g/dL (ref 30.0–36.0)
MCV: 89.8 fL (ref 78.0–100.0)
PLATELETS: 242 10*3/uL (ref 150–400)
RBC: 4.41 MIL/uL (ref 4.22–5.81)
RDW: 13.2 % (ref 11.5–15.5)
WBC: 6.5 10*3/uL (ref 4.0–10.5)

## 2015-09-01 LAB — BASIC METABOLIC PANEL
Anion gap: 9 (ref 5–15)
BUN: 14 mg/dL (ref 6–20)
CHLORIDE: 111 mmol/L (ref 101–111)
CO2: 20 mmol/L — AB (ref 22–32)
CREATININE: 0.81 mg/dL (ref 0.61–1.24)
Calcium: 9.1 mg/dL (ref 8.9–10.3)
GFR calc Af Amer: >60 mL/min (ref >60)
GFR calc non Af Amer: >60 mL/min (ref >60)
Glucose, Bld: 87 mg/dL (ref 65–99)
POTASSIUM: 3.6 mmol/L (ref 3.5–5.1)
Sodium: 140 mmol/L (ref 135–145)

## 2015-09-01 MED ORDER — CIPROFLOXACIN HCL 500 MG PO TABS: 500.0000 mg | ORAL_TABLET | Freq: Two times a day (BID) | ORAL | Status: DC

## 2015-09-01 MED ORDER — CLINDAMYCIN HCL 300 MG PO CAPS: 300.0000 mg | ORAL_CAPSULE | Freq: Three times a day (TID) | ORAL | Status: DC

## 2015-09-01 NOTE — Progress Notes (Signed)
NURSING PROGRESS NOTE  Gene [name] [id number] Discharge Data: 09/01/2015 12:19 PM Attending Provider: Alison MurrayAlma M Devine, MD WUJ:WJXBPCP:Jose [name], MD   Iona CoachWilliam W Sligar [name] to be D/C'd Home per MD order.    All IV's will be discontinued and monitored for bleeding.  All belongings will be returned to patient for patient to take home.  Last Documented Vital Signs:  Blood pressure 135/65, pulse 74, temperature 98.1 F (36.7 C), temperature source Oral, resp. rate 16, height 5\' 9"  (1.753 m), weight 81.647 kg (180 lb), SpO2 96 %.  Madelin RearLonnie Sharyn Brilliant, MSN, RN, [employer] EnergyCMSRN

## 2015-09-01 NOTE — Discharge Summary (Signed)
Physician Discharge Summary  Gene [name] ZOX:[id number] DOB: 1940-10-02 DOA: 08/28/2015  PCP: Willow OraJose Paz, MD  Admit date: 08/28/2015 Discharge date: 09/01/2015  Recommendations for Outpatient Follow-up:  Continue cipro and clindamycin for 10 days on discharge  Discharge Diagnoses:  Principal Problem:   Foot ulcer, left (HCC) Active Problems:   Anxiety and depression   HTN (hypertension)   Peripheral neuropathy (HCC)   PVD (peripheral vascular disease) (HCC)   S/P transmetatarsal amputation of foot (HCC)   Dyslipidemia   Diabetic foot ulcer (HCC)    Discharge Condition: stable   Diet recommendation: as tolerated   History of present illness:   75 y.o. male with medical history significant for peripheral vascular disease, right BKA and left transmetatarsal amputation, dyslipidemia and hypertension who presented to UC because of swelling in left foot along the area where toes were amputated. He was on antibiotics given to him by PCP, Cipro and doxycycline but no significant improvement. He was seen by orthopedic surgery in consultation. His MRI doesn't reveal evidence of osteomyelitis.    Hospital Course:   Assessment & Plan:  Principal Problem:  Foot ulcer, left (HCC) - Continue cefepime and flagyl through today - Clinda and cipro on discharge for 10 days  - MRI of the foot showed transmetatarsal amputation, ill-defined fluid collection around plantar and distal aspect of the first metatarsal, mild underlying marrow edema within the first metatarsal adjacent to this fluid collection without destruction but cannot exclude early osteomyelitis - Pt seen by Dr. Lajoyce Cornersuda, he reviewed MRI results, no surgical intervention planned at this time  Active Problems:  Anxiety and depression - Continue xanax, celexa   HTN (hypertension), essential - Continue Norvasc 5 mg daily    Peripheral neuropathy (HCC) - Continue lyrica and celexa   PVD (peripheral vascular disease)  (HCC) / S/P transmetatarsal amputation of foot (HCC) / right BKA - Managed conservatively at this point   Dyslipidemia - Continue Lipitor 40 mg at bedtime    DVT prophylaxis: Lovenox subQ Code Status: full code  Family Communication: family not at the bedside this am     Consultants:   Orthopedic surgery, Dr. Lajoyce Cornersuda  Procedures:   None  Antimicrobials:   Cefepime and flagyl 08/28/2015 --> 09/01/2015   Signed:  Manson PasseyEVINE, Tomaz Janis, MD  [facility] 09/01/2015, 10:31 AM  Pager #: [phone]  Time spent in minutes: more than 30 minutes  Discharge Exam: Filed Vitals:   08/31/15 2217 09/01/15 0550  BP: 125/59 135/65  Pulse: 58 74  Temp: 98.3 F (36.8 C) 98.1 F (36.7 C)  Resp: 16 16   Filed Vitals:   08/31/15 1109 08/31/15 1515 08/31/15 2217 09/01/15 0550  BP:  123/63 125/59 135/65  Pulse: 68 66 58 74  Temp: 97.6 F (36.4 C) 98.8 F (37.1 C) 98.3 F (36.8 C) 98.1 F (36.7 C)  TempSrc: Oral Oral Oral Oral  Resp:  20 16 16   Height:      Weight:      SpO2: 91% 96% 93% 96%    General: Pt is alert, follows commands appropriately, not in acute distress Cardiovascular: Regular rate and rhythm, S1/S2 +, no murmurs Respiratory: Clear to auscultation bilaterally, no wheezing, no crackles, no rhonchi Abdominal: Soft, non tender, non distended, bowel sounds +, no guarding Extremities: slight erythema left 1 metatarsal are, transamputation left foot; right BKA Neuro: Grossly nonfocal  Discharge Instructions  Discharge Instructions    Call MD for:  difficulty breathing, headache or visual disturbances  Complete by:  As directed      Call MD for:  persistant dizziness or light-headedness    Complete by:  As directed      Call MD for:  persistant nausea and vomiting    Complete by:  As directed      Call MD for:  severe uncontrolled pain    Complete by:  As directed      Diet - low sodium heart healthy    Complete by:  As directed      Discharge  instructions    Complete by:  As directed   Continue cipro and clindamycin for 10 days on discharge     Increase activity slowly    Complete by:  As directed             Medication List    STOP taking these medications        doxycycline 100 MG tablet  Commonly known as:  VIBRA-TABS      TAKE these medications        acetaminophen 325 MG tablet  Commonly known as:  TYLENOL  Take 650 mg by mouth every 6 (six) hours as needed (pain). Reported on 08/27/2015     Alpha-Lipoic Acid 600 MG Caps  Take 1 capsule by mouth daily.     ALPRAZolam 0.5 MG tablet  Commonly known as:  XANAX  Take 1 tablet (0.5 mg total) by mouth 3 (three) times daily.     amLODipine 5 MG tablet  Commonly known as:  NORVASC  Take 1 tablet (5 mg total) by mouth daily.     atorvastatin 40 MG tablet  Commonly known as:  LIPITOR  Take 1 tablet (40 mg total) by mouth at bedtime.     b complex vitamins capsule  Take 2 capsules by mouth daily.     ciprofloxacin 500 MG tablet  Commonly known as:  CIPRO  Take 1 tablet (500 mg total) by mouth 2 (two) times daily.     citalopram 20 MG tablet  Commonly known as:  CELEXA  Take 2 tablets (40 mg total) by mouth daily.     clindamycin 300 MG capsule  Commonly known as:  CLEOCIN  Take 1 capsule (300 mg total) by mouth 3 (three) times daily.     fenofibrate 160 MG tablet  Take 1 tablet (160 mg total) by mouth daily.     Krill Oil 1000 MG Caps  Take 4,000 mg by mouth daily.     losartan 100 MG tablet  Commonly known as:  COZAAR  Take 1 tablet (100 mg total) by mouth daily.     METAMUCIL PO  Take 2 capsules by mouth daily.     oxymetazoline 0.05 % nasal spray  Commonly known as:  AFRIN  Place 1 spray into both nostrils 2 (two) times daily as needed for congestion. Reported on 08/27/2015     pregabalin 75 MG capsule  Commonly known as:  LYRICA  Take 75 mg by mouth 2 (two) times daily.     PROBIOTIC DAILY Caps  Take 1 capsule by mouth daily.     RA  ASPIRIN EC 81 MG EC tablet  Generic drug:  aspirin  Take 81 mg by mouth daily.     SYSTANE BALANCE OP  Apply 1 drop to eye 2 (two) times daily as needed (dry eyes). Reported on 08/27/2015     tamsulosin 0.4 MG Caps capsule  Commonly known as:  FLOMAX  Take 1  capsule (0.4 mg total) by mouth daily.     traZODone 50 MG tablet  Commonly known as:  DESYREL  Take 1 tablet (50 mg total) by mouth at bedtime.     VITAMIN C & E COMBINATION PO  Take 3,000 mg by mouth daily.           Follow-up Information    Follow up with DUDA,MARCUS V, MD In 1 week.   Specialty:  Orthopedic Surgery   Contact information:   [address] [place] [address] [phone]       Follow up with [name], MD. Schedule an appointment as soon as possible for a visit in 1 week.   Specialty:  Internal Medicine   Why:  Follow up appt after recent hospitalization   Contact information:   [address] [place] [address] [phone]        The results of significant diagnostics from this hospitalization (including imaging, microbiology, ancillary and laboratory) are listed below for reference.    Significant Diagnostic Studies: Mr Foot Left Wo Contrast  08/28/2015  CLINICAL DATA:  75 year old with increasing foot pain, swelling, erythema and drainage. History of transmetatarsal amputation in 2015. EXAM: MRI OF THE LEFT FOREFOOT WITHOUT CONTRAST TECHNIQUE: Multiplanar, multisequence MR imaging was performed. No intravenous contrast was administered. COMPARISON:  Radiographs 08/26/2015 and 06/07/2015. FINDINGS: Bones: Status post transmetatarsal amputation through all of the metatarsals. The amputation margins appear sharp without cortical destruction. However, there is low-level marrow edema within the first metatarsal adjacent to the amputation. There is an adjacent ill-defined fluid collection draping over the plantar and distal aspect of the remaining first metatarsal. No clear  extension of this fluid to the skin is seen. The other metatarsal remnants appear normal. The alignment is normal at Lisfranc joint. The bones of the hindfoot and midfoot appear unremarkable. Joint/cartilage: No significant arthropathic changes or joint effusions are identified within the hindfoot or midfoot. Ligaments: The Lisfranc ligament appears intact. Tendons/muscles: The tendons appear intact at the level of the ankle. There is laxity of the flexor hallucis longus tendon attributed to previous amputation. There is muscular atrophy without focal fluid collection. Neurovascular/other soft tissues: No obvious amputation neuroma. There is generalized subcutaneous edema around the ankle. As above, there is an ill-defined fluid collection draping around the plantar and distal aspect of the remaining first metatarsal. IMPRESSION: 1. Status post transmetatarsal amputation. 2. There is an ill-defined fluid collection draping around the plantar and distal aspect of the remaining first metatarsal. This may reflect soft tissue infection, especially if this is the area of the draining wound. Alternatively, this could reflect a pseudo bursa. 3. There is mild underlying marrow edema within the first metatarsal adjacent to this fluid collection without cortical destruction. Cannot exclude early osteomyelitis. 4. Nonspecific prominent subcutaneous edema surrounding the ankle. Electronically Signed   By: Carey Bullocks M.D.   On: 08/28/2015 19:35    Microbiology: Recent Results (from the past 240 hour(s))  Culture, blood (Routine X 2) w Reflex to ID Panel     Status: None (Preliminary result)   Collection Time: 08/28/15  1:38 PM  Result Value Ref Range Status   Specimen Description BLOOD LEFT FOREARM  Final   Special Requests BOTTLES DRAWN AEROBIC ONLY  6CC  Final   Culture NO GROWTH 3 DAYS  Final   Report Status PENDING  Incomplete  Culture, blood (Routine X 2) w Reflex to ID Panel     Status: None (  Preliminary  result)   Collection Time: 08/28/15  1:40 PM  Result Value Ref Range Status   Specimen Description BLOOD RIGHT ANTECUBITAL  Final   Special Requests BOTTLES DRAWN AEROBIC AND ANAEROBIC  5CC  Final   Culture NO GROWTH 3 DAYS  Final   Report Status PENDING  Incomplete     Labs: Basic Metabolic Panel:  Recent Labs Lab 08/28/15 1337 08/28/15 2031 08/29/15 0552 08/31/15 0601 09/01/15 0514  NA 139 138 139 134* 140  K 4.1 3.6 3.6 3.4* 3.6  CL 111 109 112* 108 111  CO2 18* 18* 19* 18* 20*  GLUCOSE 93 107* 82 82 87  BUN 20 17 16 14 14   CREATININE 1.06 1.03 0.94 0.95 0.81  CALCIUM 9.5 9.3 8.8* 8.8* 9.1   Liver Function Tests:  Recent Labs Lab 08/27/15 1033 08/29/15 0552  AST 16 16  ALT 12 14*  ALKPHOS  --  28*  BILITOT  --  0.9  PROT  --  5.4*  ALBUMIN  --  2.9*   No results for input(s): LIPASE, AMYLASE in the last 168 hours. No results for input(s): AMMONIA in the last 168 hours. CBC:  Recent Labs Lab 08/26/15 1550 08/28/15 1337 08/28/15 2031 08/29/15 0552 08/31/15 0601 09/01/15 0514  WBC 6.5 5.5 5.0 5.3 5.6 6.5  NEUTROABS 4485 3.9 3.1  --   --   --   HGB 13.8 13.1 13.2 12.0* 12.4* 13.2  HCT 40.3 39.4 39.1 36.8* 37.7* 39.6  MCV 92.9 90.0 89.7 91.3 89.5 89.8  PLT 217 208 226 199 239 242   Cardiac Enzymes: No results for input(s): CKTOTAL, CKMB, CKMBINDEX, TROPONINI in the last 168 hours. BNP: BNP (last 3 results) No results for input(s): BNP in the last 8760 hours.  ProBNP (last 3 results) No results for input(s): PROBNP in the last 8760 hours.  CBG: No results for input(s): GLUCAP in the last 168 hours.

## 2015-09-01 NOTE — Discharge Instructions (Signed)
Clindamycin capsules  What is this medicine?  CLINDAMYCIN (KLIN da MYE sin) is a lincosamide antibiotic. It is used to treat certain kinds of bacterial infections. It will not work for colds, flu, or other viral infections.  This medicine may be used for other purposes; ask your health care provider or pharmacist if you have questions.  What should I tell my health care provider before I take this medicine?  They need to know if you have any of these conditions:  -kidney disease  -liver disease  -stomach problems like colitis  -an unusual or allergic reaction to clindamycin, lincomycin, or other medicines, foods, dyes like tartrazine or preservatives  -pregnant or trying to get pregnant  -breast-feeding  How should I use this medicine?  Take this medicine by mouth with a full glass of water. Follow the directions on the prescription label. You can take this medicine with food or on an empty stomach. If the medicine upsets your stomach, take it with food. Take your medicine at regular intervals. Do not take your medicine more often than directed. Take all of your medicine as directed even if you think your are better. Do not skip doses or stop your medicine early.  Talk to your pediatrician regarding the use of this medicine in children. Special care may be needed.  Overdosage: If you think you have taken too much of this medicine contact a poison control center or emergency room at once.  NOTE: This medicine is only for you. Do not share this medicine with others.  What if I miss a dose?  If you miss a dose, take it as soon as you can. If it is almost time for your next dose, take only that dose. Do not take double or extra doses.  What may interact with this medicine?  -birth control pills  -chloramphenicol  -erythromycin  -kaolin products  This list may not describe all possible interactions. Give your health care provider a list of all the medicines, herbs, non-prescription drugs, or dietary supplements you use.  Also tell them if you smoke, drink alcohol, or use illegal drugs. Some items may interact with your medicine.  What should I watch for while using this medicine?  Tell your doctor or healthcare professional if your symptoms do not start to get better or if they get worse.  Do not treat diarrhea with over the counter products. Contact your doctor if you have diarrhea that lasts more than 2 days or if it is severe and watery.  What side effects may I notice from receiving this medicine?  Side effects that you should report to your doctor or health care professional as soon as possible:  -allergic reactions like skin rash, itching or hives, swelling of the face, lips, or tongue  -dark urine  -pain on swallowing  -redness, blistering, peeling or loosening of the skin, including inside the mouth  -unusual bleeding or bruising  -unusually weak or tired  -yellowing of eyes or skin  Side effects that usually do not require medical attention (report to your doctor or health care professional if they continue or are bothersome):  -diarrhea  -itching in the rectal or genital area  -joint pain  -nausea, vomiting  -stomach pain  This list may not describe all possible side effects. Call your doctor for medical advice about side effects. You may report side effects to FDA at 1-800-FDA-1088.  Where should I keep my medicine?  Keep out of the reach of children.  Store   at room temperature between 20 and 25 degrees C (68 and 77 degrees F). Throw away any unused medicine after the expiration date.  NOTE: This sheet is a summary. It may not cover all possible information. If you have questions about this medicine, talk to your doctor, pharmacist, or health care provider.     © 2016, Elsevier/Gold Standard. (2012-09-29 16:12:32)

## 2015-09-02 ENCOUNTER — Telehealth: Payer: Self-pay | Admitting: *Deleted

## 2015-09-02 ENCOUNTER — Encounter: Payer: Self-pay | Admitting: Internal Medicine

## 2015-09-02 LAB — CULTURE, BLOOD (ROUTINE X 2)
CULTURE: NO GROWTH
Culture: NO GROWTH

## 2015-09-02 NOTE — Telephone Encounter (Signed)
Unable to reach patient at time of TCM Call.  Left message for patient to return call when available.    Pt needs 30 min HFU on or before 09/13/15. 

## 2015-09-03 NOTE — Telephone Encounter (Signed)
thx

## 2015-09-03 NOTE — Telephone Encounter (Signed)
Transition Care Management Follow-up Telephone Call  PCP: Gene OraJose Paz, MD  Admit date: 08/28/2015 Discharge date: 09/01/2015  Recommendations for Outpatient Follow-up:  Continue cipro and clindamycin for 10 days on discharge  Discharge Diagnoses:  Principal Problem:  Foot ulcer, left (HCC) Active Problems:  Anxiety and depression  HTN (hypertension)  Peripheral neuropathy (HCC)  PVD (peripheral vascular disease) (HCC)  S/P transmetatarsal amputation of foot (HCC)  Dyslipidemia  Diabetic foot ulcer (HCC)  Discharge Condition: stable   Diet recommendation: as tolerated   --   How have you been since you were released from the hospital? "Great."   Do you understand why you were in the hospital? yes   Do you understand the discharge instructions? yes   Where were you discharged to? Home   Items Reviewed:  Medications reviewed: yes  Allergies reviewed: yes  Dietary changes reviewed: no, none made per pt  Referrals reviewed: no, none made per pt   Functional Questionnaire:   Activities of Daily Living (ADLs):   He states they are independent in the following: ambulation, bathing and hygiene, feeding, continence, grooming, toileting and dressing States they require assistance with the following: None   Any transportation issues/concerns?: no   Any patient concerns? no   Confirmed importance and date/time of follow-up visits scheduled no, pt declined to schedule appt this week. Dr. Drue [name] is out of office next week, and pt declined to schedule w/ another provider in the office. He will call next week to schedule HFU w/ Dr. Drue [name] for the following week when he is back in the office. He will call w/ any questions or concerns in the meantime and he reports that Dr. Manson PasseyAlma [name] ([facility]) has been following him as well.  Confirmed with patient if condition begins to worsen call PCP or go to the ER.  Patient was given the office number and encouraged to  call back with question or concerns.  : yes

## 2015-09-04 ENCOUNTER — Ambulatory Visit: Payer: Medicare Other | Admitting: Cardiovascular Disease

## 2015-09-05 ENCOUNTER — Telehealth: Payer: Self-pay

## 2015-09-05 ENCOUNTER — Other Ambulatory Visit: Payer: Self-pay | Admitting: Family Medicine

## 2015-09-05 MED ORDER — ALPRAZOLAM 0.5 MG PO TABS: 0.5000 mg | ORAL_TABLET | Freq: Three times a day (TID) | ORAL | Status: DC

## 2015-09-05 NOTE — Telephone Encounter (Addendum)
Okay #30 and 6 RF

## 2015-09-05 NOTE — Telephone Encounter (Signed)
Rx faxed to [facility] pharmacy.  

## 2015-09-05 NOTE — Telephone Encounter (Signed)
Rx printed, awaiting MD signature.  

## 2015-09-05 NOTE — Telephone Encounter (Signed)
Pt is requesting refill on Alprazolam.  Last OV: 08/27/2015 Last Fill: 07/09/2015 #90 and 1RF Pt sig: 1 tablet TID (several days early) UDS: None  Please advise.

## 2015-09-05 NOTE — Telephone Encounter (Signed)
Is okay, we have the upcoming holiday, #90, one refill

## 2015-09-05 NOTE — Telephone Encounter (Signed)
Pt is requesting refill on Trazodone. Last Filled by Dr. Abner GreenspanBlyth in PCP absence.  Last OV: 08/27/2015 Last Fill: 08/06/2015 #30 and 0RF  UDS: None  Please advise.

## 2015-09-05 NOTE — Telephone Encounter (Signed)
Rx sent 

## 2015-09-06 ENCOUNTER — Telehealth: Payer: Self-pay

## 2015-09-06 ENCOUNTER — Ambulatory Visit: Payer: Medicare Other | Admitting: Podiatry

## 2015-09-06 MED ORDER — PREGABALIN 75 MG PO CAPS: 75.0000 mg | ORAL_CAPSULE | Freq: Two times a day (BID) | ORAL | Status: DC

## 2015-09-06 NOTE — Telephone Encounter (Signed)
Spoke w/ Pt, he takes Lyrica bid, Rx printed, signed and faxed to [facility] pharmacy.

## 2015-09-06 NOTE — Telephone Encounter (Signed)
LMOM informing Pt to return call.  

## 2015-09-06 NOTE — Telephone Encounter (Signed)
Clarify if he takes twice  or 3 times a day. Okay three-month supply and 1 RF

## 2015-09-06 NOTE — Telephone Encounter (Signed)
Pt is requesting refill on Lyrica.  Last OV: 08/27/2015  Last Fill: 02/04/2015 #270 and 1RF Pt sig: 1 cap TID   Okay to refill?

## 2015-09-18 ENCOUNTER — Telehealth (HOSPITAL_COMMUNITY): Payer: Self-pay | Admitting: Cardiovascular Disease

## 2015-09-18 NOTE — Telephone Encounter (Signed)
Called pt and spoke with him to r/s his stress test appointment and he voiced that he is currently battling an infection in his lft foot and will call back at a later time to reschedule.

## 2015-09-23 ENCOUNTER — Telehealth: Payer: Self-pay | Admitting: *Deleted

## 2015-09-23 NOTE — Telephone Encounter (Signed)
Forwarded to Jordan to scan/email to medical records 

## 2015-10-03 ENCOUNTER — Other Ambulatory Visit: Payer: Self-pay | Admitting: Internal Medicine

## 2015-10-03 NOTE — Telephone Encounter (Signed)
Pt is requesting refill on Trazodone.  Last OV: 08/27/2015 Last Fill: 09/05/2015 #30 and 0RF UDS: None  Please advise.

## 2015-10-03 NOTE — Telephone Encounter (Signed)
Ok 30 and 5 RF 

## 2015-10-03 NOTE — Telephone Encounter (Signed)
Rx sent 

## 2015-10-28 DIAGNOSIS — R0989 Other specified symptoms and signs involving the circulatory and respiratory systems: Secondary | ICD-10-CM | POA: Insufficient documentation

## 2015-10-28 DIAGNOSIS — I6523 Occlusion and stenosis of bilateral carotid arteries: Secondary | ICD-10-CM | POA: Insufficient documentation

## 2015-10-31 ENCOUNTER — Other Ambulatory Visit: Payer: Self-pay | Admitting: Internal Medicine

## 2015-10-31 NOTE — Telephone Encounter (Signed)
Pt is requesting refill on Alprazolam.  Last OV: 08/27/2015 Last Fill: 09/05/2015 #90 and 1RF UDS: None  Please advise.

## 2015-11-01 NOTE — Telephone Encounter (Signed)
Rx faxed to [facility] pharmacy.  

## 2015-11-01 NOTE — Telephone Encounter (Signed)
Okay #90, no refills 

## 2015-11-01 NOTE — Telephone Encounter (Signed)
Rx printed, awaiting MD signature.  

## 2015-11-27 ENCOUNTER — Encounter: Payer: Self-pay | Admitting: Internal Medicine

## 2015-11-27 ENCOUNTER — Ambulatory Visit (INDEPENDENT_AMBULATORY_CARE_PROVIDER_SITE_OTHER): Payer: Medicare Other | Admitting: Internal Medicine

## 2015-11-27 VITALS — BP 130/76 | HR 58 | Temp 97.6°F | Resp 14 | Ht 69.0 in | Wt 183.5 lb

## 2015-11-27 DIAGNOSIS — F418 Other specified anxiety disorders: Secondary | ICD-10-CM

## 2015-11-27 DIAGNOSIS — F419 Anxiety disorder, unspecified: Secondary | ICD-10-CM

## 2015-11-27 DIAGNOSIS — F329 Major depressive disorder, single episode, unspecified: Secondary | ICD-10-CM

## 2015-11-27 DIAGNOSIS — I10 Essential (primary) hypertension: Secondary | ICD-10-CM

## 2015-11-27 DIAGNOSIS — R079 Chest pain, unspecified: Secondary | ICD-10-CM

## 2015-11-27 DIAGNOSIS — I739 Peripheral vascular disease, unspecified: Secondary | ICD-10-CM | POA: Diagnosis not present

## 2015-11-27 LAB — BASIC METABOLIC PANEL
BUN: 29 mg/dL — AB (ref 6–23)
CHLORIDE: 111 meq/L (ref 96–112)
CO2: 25 meq/L (ref 19–32)
CREATININE: 1.03 mg/dL (ref 0.40–1.50)
Calcium: 9 mg/dL (ref 8.4–10.5)
GFR: 74.98 mL/min (ref >60.00)
GLUCOSE: 91 mg/dL (ref 70–99)
Potassium: 4.1 mEq/L (ref 3.5–5.1)
Sodium: 141 mEq/L (ref 135–145)

## 2015-11-27 MED ORDER — ALPRAZOLAM 0.5 MG PO TABS: 0.5000 mg | ORAL_TABLET | Freq: Three times a day (TID) | ORAL | 3 refills | Status: DC

## 2015-11-27 MED ORDER — AZELASTINE HCL 0.1 % NA SOLN: 2.0000 | Freq: Two times a day (BID) | NASAL | 6 refills | Status: DC

## 2015-11-27 NOTE — Progress Notes (Signed)
Subjective:    Patient ID: Gene [name], male    DOB: September 22, 1940, 75 y.o.   MRN: [id number]  DOS:  11/27/2015 Type of visit - description :  Interval history: Since the last visit 3 months ago he was admitted to the hospital with a left foot ulcer. Was treated with antibiotics, MRI of the foot show a question of osteomyelitis, was evaluated by orthopedic surgery, surgery was not planned. Was discharged home on abx , has seen Dr. Lajoyce Cornersuda around 3 times, area has healed well. Peripheral vascular disease: [facility], they recommend to continue dual antiplatelet therapy. Anxiety insomnia and depression: Well-controlled, Refill on Xanax HTN: Good medication compliance, ambulatory BP are always in the 120/80 range.   Review of Systems  denies any nausea, Vomiting, diarrhea. When asked, admits to left upper chest pain, about once a week, pattern is stable, already discussed with Dr. Allyson SabalBerry, has not been able to proceed with a stress test as recommended.  No difficulty breathing No fever or chills Past Medical History:  Diagnosis Date  . Anemia    "just in 2016 when I was bleeding internally" (08/28/2015)  . Anxiety   . Aortic stenosis   . Arthritis   . Depression   . Diabetic ulcer of left foot (HCC) 08/28/2015  . Family history of anesthesia complication    "father would start seeing things"  . Heart murmur    "related to the aortic stenosis" (08/28/2015)  . History of alcohol abuse   . History of blood transfusion    "just in 2016 when I was bleeding internally" (08/28/2015)  . Hyperlipidemia    high TG  . Hypertension   . Lesion of ulnar nerve 07/22/2012   Bilateral ulnar neuropathies  . Neuropathy (HCC)   . 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   left  . PVD (peripheral vascular disease) (HCC)    PVD, mild saw Dr Allyson SabalBerry 2012, intolerant to pletal, Rx observation  . Ulcer of esophagus 2016   "caused by positioning related to foot wound"     Past Surgical History:  Procedure Laterality Date  . AMPUTATION Left 11/20/13 and 11/24/13   middle  . AMPUTATION Left 01/03/2014   Procedure: Left Transmetatarsal Amputation;  Surgeon: Nadara MustardMarcus Duda V, MD;  [facility];  Service: Orthopedics;  Laterality: Left;  . AMPUTATION Right 07/27/2014   Procedure: Right 4th Ray Amputation;  Surgeon: Nadara MustardMarcus Duda V, MD;  [facility];  Service: Orthopedics;  Laterality: Right;  . AMPUTATION Right 09/12/2014   Procedure: RIGHT TRANSMETATARSAL AMPUTATION;  Surgeon: Nadara MustardMarcus Duda V, MD;  Location: [facility] OR;  Service: Orthopedics;  Laterality: Right;  . AMPUTATION Right 09/28/2014   Procedure: Right Below Knee Amputation;  Surgeon: Nadara MustardMarcus Duda V, MD;  [facility];  Service: Orthopedics;  Laterality: Right;  . CATARACT EXTRACTION W/ INTRAOCULAR LENS  IMPLANT, BILATERAL Bilateral 05/2015  . ESOPHAGOGASTRODUODENOSCOPY N/A 08/16/2014   Procedure: ESOPHAGOGASTRODUODENOSCOPY (EGD);  Surgeon: Vida RiggerMarc Magod, MD;  [facility];  Service: Endoscopy;  Laterality: N/A;  . ESOPHAGOGASTRODUODENOSCOPY (EGD) WITH PROPOFOL N/A 08/17/2014   Procedure: ESOPHAGOGASTRODUODENOSCOPY (EGD) WITH PROPOFOL;  Surgeon: Graylin ShiverSalem F Ganem, MD;  [facility];  Service: Endoscopy;  Laterality: N/A;  . LAPAROSCOPIC CHOLECYSTECTOMY    . TONSILLECTOMY    . TRANSMETATARSAL AMPUTATION Left 10/2013    Social History   Social History  . Marital status: Divorced    Spouse name: N/A  . Number of children: 3  .  Years of education: College   Occupational History  . 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 Smoking status: Never Smoker  . Smokeless tobacco: Never Used  . Alcohol use 6.0 oz/week    10 Cans of beer per week     Comment: 08/28/2015 "under care of nutritionist who encourages me to drink 2 beer/day; never had h/o abuse"  . Drug use: No  . Sexual activity: No   Other Topics Concern  . Not on file   Social  History Narrative    Divorced since 1994; lives by himself, 3 sons, 1 GD  (all live close by)   [place] veteran          Medication List       Accurate as of 11/27/15 11:12 AM. Always use your most recent med list.          acetaminophen 325 MG tablet Commonly known as:  TYLENOL Take 650 mg by mouth every 6 (six) hours as needed (pain). Reported on 08/27/2015   Alpha-Lipoic Acid 600 MG Caps Take 1 capsule by mouth daily.   ALPRAZolam 0.5 MG tablet Commonly known as:  XANAX Take 1 tablet (0.5 mg total) by mouth 3 (three) times daily.   amLODipine 5 MG tablet Commonly known as:  NORVASC Take 1 tablet (5 mg total) by mouth daily.   atorvastatin 40 MG tablet Commonly known as:  LIPITOR Take 1 tablet (40 mg total) by mouth at bedtime.   b complex vitamins capsule Take 2 capsules by mouth daily.   ciprofloxacin 500 MG tablet Commonly known as:  CIPRO Take 1 tablet (500 mg total) by mouth 2 (two) times daily.   citalopram 20 MG tablet Commonly known as:  CELEXA Take 2 tablets (40 mg total) by mouth daily.   clindamycin 300 MG capsule Commonly known as:  CLEOCIN Take 1 capsule (300 mg total) by mouth 3 (three) times daily.   clopidogrel 75 MG tablet Commonly known as:  PLAVIX Take 75 mg by mouth daily.   fenofibrate 160 MG tablet Take 1 tablet (160 mg total) by mouth daily.   FLAGYL PO Take by mouth 2 (two) times daily.   Krill Oil 1000 MG Caps Take 4,000 mg by mouth daily.   losartan 100 MG tablet Commonly known as:  COZAAR Take 1 tablet (100 mg total) by mouth daily.   METAMUCIL PO Take 2 capsules by mouth daily.   oxymetazoline 0.05 % nasal spray Commonly known as:  AFRIN Place 1 spray into both nostrils 2 (two) times daily as needed for congestion. Reported on 08/27/2015   pregabalin 75 MG capsule Commonly known as:  LYRICA Take 1 capsule (75 mg total) by mouth 2 (two) times daily.   PROBIOTIC DAILY Caps Take 1 capsule by mouth daily.   RA  ASPIRIN EC 81 MG EC tablet Generic drug:  aspirin Take 81 mg by mouth daily.   SYSTANE BALANCE OP Apply 1 drop to eye 2 (two) times daily as needed (dry eyes). Reported on 08/27/2015   tamsulosin 0.4 MG Caps capsule Commonly known as:  FLOMAX Take 1 capsule (0.4 mg total) by mouth daily.   traZODone 50 MG tablet Commonly known as:  DESYREL Take 1 tablet (50 mg total) by mouth at bedtime.   VITAMIN C & E COMBINATION PO Take 3,000 mg by mouth daily.          Objective:  Physical Exam BP 130/76 (BP Location: Left Arm, Patient Position: Sitting, Cuff Size: Normal)   Pulse (!) 58   Temp 97.6 F (36.4 C) (Oral)   Resp 14   Ht 5\' 9"  (1.753 m)   Wt 183 lb 8 oz (83.2 kg)   SpO2 96%   BMI 27.10 kg/m  General:   Well developed, well nourished . NAD.  HEENT:  Normocephalic . Face symmetric, atraumatic Lungs:  CTA B Normal respiratory effort, no intercostal retractions, no accessory muscle use. Heart: RRR,  Soft syst  murmur.  Skin: Not pale. Not jaundice Neurologic:  alert & oriented X3.  Speech normal, gait limited  by MSK issues Psych--  Cognition and judgment appear intact.  Cooperative with normal attention span and concentration.  Behavior appropriate. No anxious or depressed appearing.      Assessment & Plan:    Assessement > Hyperglycemia,  (A1c 5.8  2013) Neuropathy , peripheral: d/t ETOH (per neuro note, Dr Anne Hahn,  2014) NCS 05-2011: Moderate neuropathy, low-grade right L5 radiculopathy Bilateral ulnar neuropathy HTN Hyperlipidemia Anxiety, depression , insomnia onset 1995 after a divorce CV: --Aortic stenosis: Mild per echocardiogram 12-2014, has a soft murmur --Peripheral vascular disease ---  Used to see  Dr. Allyson Sabal . Pletal intolerant, ABIs normal but vessels are noncompressible.  Now sees Dr Smith Robert [facility]), s/p angioplasty L dorsalis pedis ~ 09-2015 MSK: --Osteomyelitis --Multiple toes amputations 2014 --Left transmetatarsal amputation 01/03/2014  (osteomyelitis) --Right transmetatarsal amputation 09/12/2014 -- R BKA  09/28/2014 d/  Dehiscence midfoot amputation GI bleed, admited 08-2014 >>> EGD  esophageal ulcer (d/t nsaids and doxycycline ?) H/o Alcohol abuse   PLAN HTN: Continue amlodipine, losartan, check a BMP Anxiety depression insomnia: controlled, refill Xanax, continue citalopram and trazodone. Peripheral vascular disease: Now under the care of Dr. Smith Robert Foot infection: Improved per patient, s/p abx Chest pain: Going on for a few months, pattern is stable, was unable to proceed with a stress test as recommended by Dr. Allyson Sabal. He already discuss with new cardiologist Dr. Smith Robert, encouraged to call him and set up a stress test. Rhinitis: Chronic use of Afrin, strongly encouraged to discontinue it, use Astelin instead. RTC CPX 4-5 months  Today, I spent more than 28 minutes with the patient, reviewing the chart

## 2015-11-27 NOTE — Assessment & Plan Note (Signed)
HTN: Continue amlodipine, losartan, check a BMP Anxiety depression insomnia: controlled, refill Xanax, continue citalopram and trazodone. Peripheral vascular disease: Now under the care of Dr. Smith Robertao Foot infection: Improved per patient, s/p abx Chest pain: Going on for a few months, pattern is stable, was unable to proceed with a stress test as recommended by Dr. Allyson SabalBerry. He already discuss with new cardiologist Dr. Smith Robertao, encouraged to call him and set up a stress test. Rhinitis: Chronic use of Afrin, strongly encouraged to discontinue it, use Astelin instead. RTC CPX 4-5 months

## 2015-11-27 NOTE — Progress Notes (Signed)
Pre visit review using our clinic review tool, if applicable. No additional management support is needed unless otherwise documented below in the visit note. 

## 2015-11-27 NOTE — Patient Instructions (Addendum)
GO TO THE LAB : Get the blood work     GO TO THE FRONT DESK Schedule your next appointment for a  physical exam in 4-5 months    Stop using the OTC nose spray Afrin Use instead  Astelin twice a day

## 2015-11-29 ENCOUNTER — Telehealth: Payer: Self-pay | Admitting: Internal Medicine

## 2015-11-29 NOTE — Telephone Encounter (Signed)
OV note printed, and placed in mail to Pt. Pt informed.

## 2015-11-29 NOTE — Telephone Encounter (Signed)
Relation to ZO:XWRUpt:self Call back number: best # [phone]   Reason for call:  Patient requesting 11/27/15 office notes and would like notes [facility]. Patient requesting to speak with PCP CMA. Please advise

## 2015-12-05 ENCOUNTER — Encounter: Payer: Self-pay | Admitting: Internal Medicine

## 2015-12-06 ENCOUNTER — Telehealth: Payer: Self-pay | Admitting: Internal Medicine

## 2015-12-06 NOTE — Telephone Encounter (Signed)
OV note re-printed and re-mailed to Pt.

## 2015-12-06 NOTE — Telephone Encounter (Signed)
Pt called in to make CMA aware that he never received the OV notes mailed. He would like to know if she could resend them. Verified pt's mailing address, correct.    Please assist further

## 2015-12-06 NOTE — Telephone Encounter (Signed)
Completed.

## 2015-12-30 ENCOUNTER — Encounter: Payer: Self-pay | Admitting: Internal Medicine

## 2015-12-30 DIAGNOSIS — I350 Nonrheumatic aortic (valve) stenosis: Secondary | ICD-10-CM | POA: Insufficient documentation

## 2016-02-06 ENCOUNTER — Ambulatory Visit (INDEPENDENT_AMBULATORY_CARE_PROVIDER_SITE_OTHER): Payer: Medicare Other | Admitting: Family

## 2016-02-06 ENCOUNTER — Ambulatory Visit (INDEPENDENT_AMBULATORY_CARE_PROVIDER_SITE_OTHER): Payer: Medicare Other | Admitting: Orthopedic Surgery

## 2016-02-11 ENCOUNTER — Other Ambulatory Visit: Payer: Self-pay | Admitting: Internal Medicine

## 2016-02-13 ENCOUNTER — Other Ambulatory Visit: Payer: Self-pay | Admitting: Internal Medicine

## 2016-03-23 ENCOUNTER — Telehealth: Payer: Self-pay | Admitting: Internal Medicine

## 2016-03-23 NOTE — Telephone Encounter (Signed)
Xanax --Okay 90, 1 refill   Trazodone --- ok 30 and 2 refills

## 2016-03-23 NOTE — Telephone Encounter (Signed)
Pt is requesting refill on Alprazolam and Trazodone.  Last OV: 11/27/2015 Last Fill on Alprazolam: 11/27/2015 #90 and 3RF (Pt sig: 1 tab tid prn) Last Fill on Trazodone: 10/03/2015 #30 and 5RF UDS: None  Please advise.

## 2016-03-24 ENCOUNTER — Other Ambulatory Visit: Payer: Self-pay | Admitting: Internal Medicine

## 2016-03-24 NOTE — Telephone Encounter (Signed)
Rx faxed to [facility] pharmacy.  

## 2016-03-24 NOTE — Telephone Encounter (Signed)
Rx's printed, awaiting MD signature.  

## 2016-03-24 NOTE — Telephone Encounter (Signed)
Rx's re-faxed to [facility] pharmacy.

## 2016-03-24 NOTE — Telephone Encounter (Signed)
Patient called stating that the pharmacy still does not have this prescription as of this morning. He would like to get it before the snow comes in tomorrow. Please advise.

## 2016-05-07 ENCOUNTER — Telehealth: Payer: Self-pay | Admitting: *Deleted

## 2016-05-07 NOTE — Progress Notes (Signed)
Pre visit review using our clinic review tool, if applicable. No additional management support is needed unless otherwise documented below in the visit note. 

## 2016-05-07 NOTE — Progress Notes (Addendum)
Subjective:   Gene [name] is a 76 y.o. male who presents for Medicare Annual/Subsequent preventive examination.  Review of Systems:  No ROS.  Medicare Wellness Visit.  Cardiac Risk Factors include: advanced age (>77men, >73 women);hypertension;male gender;dyslipidemia;sedentary lifestyle Sleep patterns: Takes medications to sleep. Sleeps 8-10hrs per night. Wakes 2-3 x to urinate. Feels rested.   Home Safety/Smoke Alarms:  Feels safe in home. Smoke alarms in place.  Living environment; residence and Firearm Safety: Lives alone in 3 story home. Guns safely stored. Seat Belt Safety/Bike Helmet: Wears seat belt.   Counseling:   Eye Exam- Wears reading glasses. Dr.Shapiro and Dr.Rankin yearly Dental-Dr.Bonnick annually.  Male:   CCS-  04/08/2009 internal hemorrhoids, diverticulosis.  PSA-  Lab Results  Component Value Date   PSA 1.16 05/11/2014   PSA 0.88 01/20/2013   PSA 1.63 09/26/2010        Objective:    Vitals: BP 128/70 (BP Location: Right Arm, Patient Position: Sitting, Cuff Size: Large)   Pulse 61   Ht  (1.753 m)   Wt 190 lb (86.2 kg)   SpO2 98%   BMI 28.06 kg/m   Body mass index is 28.06 kg/m.  Tobacco History  Smoking Status  . Never Smoker  Smokeless Tobacco  . Never Used     Counseling given: Not Answered   Past Medical History:  Diagnosis Date  . Anemia    "just in 2016 when I was bleeding internally" (08/28/2015)  . Anxiety   . Aortic stenosis   . Arthritis   . Cervical spondylolysis   . Depression   . Diabetic ulcer of left foot (HCC) 08/28/2015  . Family history of anesthesia complication    "father would start seeing things"  . Heart murmur    "related to the aortic stenosis" (08/28/2015)  . History of alcohol abuse   . History of blood transfusion    "just in 2016 when I was bleeding internally" (08/28/2015)  . Hyperlipidemia    high TG  . Hypertension   . Lesion of ulnar nerve 07/22/2012   Bilateral ulnar neuropathies  .  Neuropathy (HCC)   . 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   left  . PVD (peripheral vascular disease) (HCC)    PVD, mild saw Dr Allyson Sabal 2012, intolerant to pletal, Rx observation  . Ulcer of esophagus 2016   "caused by positioning related to foot wound"   Past Surgical History:  Procedure Laterality Date  . AMPUTATION Left 11/20/13 and 11/24/13   middle  . AMPUTATION Left 01/03/2014   Procedure: Left Transmetatarsal Amputation;  Surgeon: Nadara Mustard, MD;  Location: [facility] OR;  Service: Orthopedics;  Laterality: Left;  . AMPUTATION Right 07/27/2014   Procedure: Right 4th Ray Amputation;  Surgeon: Nadara Mustard, MD;  Location: [facility] OR;  Service: Orthopedics;  Laterality: Right;  . AMPUTATION Right 09/12/2014   Procedure: RIGHT TRANSMETATARSAL AMPUTATION;  Surgeon: Nadara Mustard, MD;  [facility]: [facility] OR;  Service: Orthopedics;  Laterality: Right;  . AMPUTATION Right 09/28/2014   Procedure: Right Below Knee Amputation;  Surgeon: Nadara Mustard, MD;  Location: [facility] OR;  Service: Orthopedics;  Laterality: Right;  . CATARACT EXTRACTION W/ INTRAOCULAR LENS  IMPLANT, BILATERAL Bilateral 05/2015  . ESOPHAGOGASTRODUODENOSCOPY N/A 08/16/2014   Procedure: ESOPHAGOGASTRODUODENOSCOPY (EGD);  Surgeon: Vida Rigger, MD;  Location: [facility] ENDOSCOPY;  Service: Endoscopy;  Laterality: N/A;  . ESOPHAGOGASTRODUODENOSCOPY (EGD) WITH PROPOFOL N/A 08/17/2014   Procedure: ESOPHAGOGASTRODUODENOSCOPY (EGD) WITH PROPOFOL;  Surgeon: Karel Jarvis  Leafy Ro, MD;  Location: [facility] ENDOSCOPY;  Service: Endoscopy;  Laterality: N/A;  . LAPAROSCOPIC CHOLECYSTECTOMY    . TONSILLECTOMY    . TRANSMETATARSAL AMPUTATION Left 10/2013   Family History  Problem Relation Age of Onset  . Hyperlipidemia Mother   . Lung cancer Father     asbestos releated  . Colon polyps Father   . Diabetes Neg Hx   . Coronary artery disease Neg Hx   . Prostate cancer Neg Hx    History  Sexual Activity  . Sexual activity: No    Outpatient Encounter Prescriptions as of  05/08/2016  Medication Sig  . acetaminophen (TYLENOL) 325 MG tablet Take 650 mg by mouth every 6 (six) hours as needed (pain). Reported on 08/27/2015  . ALPRAZolam (XANAX) 0.5 MG tablet Take 1 tablet (0.5 mg total) by mouth 3 (three) times daily.  Marland Kitchen amLODipine (NORVASC) 5 MG tablet Take 1 tablet (5 mg total) by mouth daily.  Marland Kitchen atorvastatin (LIPITOR) 40 MG tablet Take 1 tablet (40 mg total) by mouth at bedtime.  . citalopram (CELEXA) 20 MG tablet Take 2 tablets (40 mg total) by mouth daily.  . clopidogrel (PLAVIX) 75 MG tablet Take 75 mg by mouth daily.  . fenofibrate 160 MG tablet Take 1 tablet (160 mg total) by mouth daily.  Marland Kitchen losartan (COZAAR) 100 MG tablet Take 1 tablet (100 mg total) by mouth daily.  . pregabalin (LYRICA) 75 MG capsule Take 1 capsule (75 mg total) by mouth 2 (two) times daily.  . Probiotic Product (PROBIOTIC DAILY) CAPS Take 1 capsule by mouth daily.  Marland Kitchen Propylene Glycol (SYSTANE BALANCE OP) Apply 1 drop to eye 2 (two) times daily as needed (dry eyes). Reported on 08/27/2015  . Psyllium (METAMUCIL PO) Take 2 capsules by mouth daily.  Marland Kitchen RA ASPIRIN EC 81 MG EC tablet Take 81 mg by mouth daily.  . tamsulosin (FLOMAX) 0.4 MG CAPS capsule Take 1 capsule (0.4 mg total) by mouth daily.  . traZODone (DESYREL) 50 MG tablet Take 1 tablet (50 mg total) by mouth at bedtime.  . Vitamins C E (VITAMIN C & E COMBINATION PO) Take 3,000 mg by mouth daily.   . [DISCONTINUED] Alpha-Lipoic Acid 600 MG CAPS Take 1 capsule by mouth daily.  . [DISCONTINUED] azelastine (ASTELIN) 0.1 % nasal spray Place 2 sprays into both nostrils 2 (two) times daily.  . [DISCONTINUED] b complex vitamins capsule Take 2 capsules by mouth daily.   . [DISCONTINUED] Krill Oil 1000 MG CAPS Take 4,000 mg by mouth daily.   . [DISCONTINUED] oxymetazoline (AFRIN) 0.05 % nasal spray Place 1 spray into both nostrils 2 (two) times daily as needed for congestion. Reported on 08/27/2015   No facility-administered encounter  medications on file as of 05/08/2016.     Activities of Daily Living In your present state of health, do you have any difficulty performing the following activities: 05/08/2016 11/27/2015  Hearing? N N  Vision? N N  Difficulty concentrating or making decisions? N N  Walking or climbing stairs? N Y  Dressing or bathing? N N  Doing errands, shopping? N N  Preparing Food and eating ? N -  Using the Toilet? N -  In the past six months, have you accidently leaked urine? N -  Do you have problems with loss of bowel control? N -  Managing your Medications? N -  Managing your Finances? N -  Housekeeping or managing your Housekeeping? N -  Some recent data might be hidden  Patient Care Team: [name], MD as PCP - General Vivi Barrack, DPM as Consulting Physician (Podiatry) [name], MD as Consulting Physician (Cardiology) Nadara Mustard, MD as Consulting Physician (Orthopedic Surgery) Arminda Resides, MD as Consulting Physician (Dermatology) Darlina Guys, MD as Consulting Physician (Internal Medicine)   Assessment:    Physical assessment deferred to PCP.  Exercise Activities and Dietary recommendations Current Exercise Habits: The patient does not participate in regular exercise at present, Exercise limited by: orthopedic condition(s)   Diet (meal preparation, eat out, water intake, caffeinated beverages, dairy products, fruits and vegetables): on average, 2 meals per day Breakfast: Tomasa Blase and eggs. 3c coffee Lunch: none Dinner: lean cuisine Drinks 2 bottles of water per day.  Goals    . Would like to keep left leg healthy      Fall Risk Fall Risk  05/08/2016 11/27/2015 08/27/2015 01/07/2015 01/07/2015  Falls in the past year? No No No Exclusion - non ambulatory Yes  Number falls in past yr: - - - - 2 or more  Injury with Fall? - - - - No  Risk Factor Category  - - - - High Fall Risk  Risk for fall due to : - - - - Impaired balance/gait  Follow up - - - - Education  provided   Depression Screen PHQ 2/9 Scores 05/08/2016 11/27/2015 08/27/2015 01/07/2015  PHQ - 2 Score 0 0 0 0  Exception Documentation - - - -    Cognitive Function Ad8 score reviewed for issues:  Issues making decisions:no  Less interest in hobbies / activities:no  Repeats questions, stories (family complaining):no  Trouble using ordinary gadgets (microwave, computer, phone):no  Forgets the month or year: no  Mismanaging finances: no  Remembering appts:no  Daily problems with thinking and/or memory:no Ad8 score is=0            Immunization History  Administered Date(s) Administered  . Influenza Split 12/16/2013  . Influenza Whole 03/27/2009, 02/04/2010  . Influenza, High Dose Seasonal PF 12/02/2012, 12/24/2014  . Influenza-Unspecified 10/08/2015  . Pneumococcal Conjugate-13 05/11/2014  . Pneumococcal Polysaccharide-23 08/06/2009  . Td 10/01/2006  . Tdap 07/09/2015  . Zoster 01/08/2011   Screening Tests Health Maintenance  Topic Date Due  . FOOT EXAM  09/04/1950  . OPHTHALMOLOGY EXAM  09/04/1950  . COLONOSCOPY  04/08/2014  . HEMOGLOBIN A1C  02/27/2016  . TETANUS/TDAP  07/08/2025  . INFLUENZA VACCINE  Completed  . PNA vac Low Risk Adult  Completed      Plan:     Continue to eat heart healthy diet (full of fruits, vegetables, whole grains, lean protein, water--limit salt, fat, and sugar intake) and increase physical activity as tolerated.  Continue doing brain stimulating activities (puzzles, reading, adult coloring books, staying active) to keep memory sharp.   During the course of the visit the patient was educated and counseled about the following appropriate screening and preventive services:   Vaccines to include Pneumoccal, Influenza, Hepatitis B, Td, Zostavax, HCV  Cardiovascular Disease  Colorectal cancer screening  Diabetes screening  Prostate Cancer Screening  Glaucoma screening  Nutrition counseling   Smoking cessation  counseling  Patient Instructions (the written plan) was given to the patient.    Mady Haagensen [place], [place]  05/08/2016  [name], MD

## 2016-05-07 NOTE — Telephone Encounter (Signed)
Scheduled AWV for 05/08/16 @1 .

## 2016-05-08 ENCOUNTER — Ambulatory Visit (INDEPENDENT_AMBULATORY_CARE_PROVIDER_SITE_OTHER): Payer: Medicare Other | Admitting: Internal Medicine

## 2016-05-08 ENCOUNTER — Encounter: Payer: Self-pay | Admitting: Internal Medicine

## 2016-05-08 VITALS — BP 128/70 | HR 61 | Ht 69.0 in | Wt 190.0 lb

## 2016-05-08 DIAGNOSIS — Z0000 Encounter for general adult medical examination without abnormal findings: Secondary | ICD-10-CM | POA: Diagnosis not present

## 2016-05-08 DIAGNOSIS — R739 Hyperglycemia, unspecified: Secondary | ICD-10-CM | POA: Diagnosis not present

## 2016-05-08 DIAGNOSIS — E785 Hyperlipidemia, unspecified: Secondary | ICD-10-CM

## 2016-05-08 DIAGNOSIS — I10 Essential (primary) hypertension: Secondary | ICD-10-CM | POA: Diagnosis not present

## 2016-05-08 LAB — LIPID PANEL
Cholesterol: 147 mg/dL (ref 0–200)
HDL: 47.4 mg/dL (ref >39.00)
LDL Cholesterol: 81 mg/dL (ref 0–99)
NONHDL: 99.49
Total CHOL/HDL Ratio: 3
Triglycerides: 93 mg/dL (ref 0.0–149.0)
VLDL: 18.6 mg/dL (ref 0.0–40.0)

## 2016-05-08 LAB — BASIC METABOLIC PANEL
BUN: 24 mg/dL — AB (ref 6–23)
CO2: 24 mEq/L (ref 19–32)
CREATININE: 1.08 mg/dL (ref 0.40–1.50)
Calcium: 9.2 mg/dL (ref 8.4–10.5)
Chloride: 109 mEq/L (ref 96–112)
GFR: 70.91 mL/min (ref >60.00)
Glucose, Bld: 80 mg/dL (ref 70–99)
Potassium: 4.1 mEq/L (ref 3.5–5.1)
Sodium: 140 mEq/L (ref 135–145)

## 2016-05-08 LAB — HEMOGLOBIN A1C: HEMOGLOBIN A1C: 5.8 % (ref 4.6–6.5)

## 2016-05-08 NOTE — Assessment & Plan Note (Addendum)
---  Td 08, declined a booster;  pneumonia shot 2011; prevnar 2016;  zostavax ~2012 ; had a flu shot ---several Cscopes, see PMH, last 03-2009, next cscope schedule for next week ---DRE and PSA wnl 2016   ---labs  on BMP, A1c diet and exercise discussed

## 2016-05-08 NOTE — Progress Notes (Signed)
Subjective:    Patient ID: Gene [name], male    DOB: 02-Apr-1940, 76 y.o.   MRN: [id number]  DOS:  05/08/2016 Type of visit - description : cpx Interval history: Feeling well. Good med compliance. Reports a "knot" at the left side of the neck, already checked at the [facility], CT with contrast showing no major abnormality except neck DJD. Has lost some strength at the right hand, was told probably related to neck DJD. [facility]. Occasionally has urinary urgency but no other urinary symptoms.  Review of Systems   Other than above, a 14 point review of systems is negative   Past Medical History:  Diagnosis Date  . Anemia    "just in 2016 when I was bleeding internally" (08/28/2015)  . Anxiety   . Aortic stenosis   . Arthritis   . Cervical spondylolysis   . Depression   . Diabetic ulcer of left foot (HCC) 08/28/2015  . Family history of anesthesia complication    "father would start seeing things"  . Heart murmur    "related to the aortic stenosis" (08/28/2015)  . History of alcohol abuse   . History of blood transfusion    "just in 2016 when I was bleeding internally" (08/28/2015)  . Hyperlipidemia    high TG  . Hypertension   . Lesion of ulnar nerve 07/22/2012   Bilateral ulnar neuropathies  . Neuropathy (HCC)   . 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   left  . PVD (peripheral vascular disease) (HCC)    PVD, mild saw Dr Allyson SabalBerry 2012, intolerant to pletal, Rx observation  . Ulcer of esophagus 2016   "caused by positioning related to foot wound"    Past Surgical History:  Procedure Laterality Date  . AMPUTATION Left 11/20/13 and 11/24/13   middle  . AMPUTATION Left 01/03/2014   Procedure: Left Transmetatarsal Amputation;  Surgeon: Nadara MustardMarcus Duda V, MD;  [facility];  Service: Orthopedics;  Laterality: Left;  . AMPUTATION Right 07/27/2014   Procedure: Right 4th Ray Amputation;  Surgeon: Nadara MustardMarcus Duda V, MD;  [facility];  Service: Orthopedics;   Laterality: Right;  . AMPUTATION Right 09/12/2014   Procedure: RIGHT TRANSMETATARSAL AMPUTATION;  Surgeon: Nadara MustardMarcus Duda V, MD;  Location: [facility] OR;  Service: Orthopedics;  Laterality: Right;  . AMPUTATION Right 09/28/2014   Procedure: Right Below Knee Amputation;  Surgeon: Nadara MustardMarcus Duda V, MD;  [facility];  Service: Orthopedics;  Laterality: Right;  . CATARACT EXTRACTION W/ INTRAOCULAR LENS  IMPLANT, BILATERAL Bilateral 05/2015  . ESOPHAGOGASTRODUODENOSCOPY N/A 08/16/2014   Procedure: ESOPHAGOGASTRODUODENOSCOPY (EGD);  Surgeon: Vida RiggerMarc Magod, MD;  [facility];  Service: Endoscopy;  Laterality: N/A;  . ESOPHAGOGASTRODUODENOSCOPY (EGD) WITH PROPOFOL N/A 08/17/2014   Procedure: ESOPHAGOGASTRODUODENOSCOPY (EGD) WITH PROPOFOL;  Surgeon: Graylin ShiverSalem F Ganem, MD;  [facility];  Service: Endoscopy;  Laterality: N/A;  . LAPAROSCOPIC CHOLECYSTECTOMY    . TONSILLECTOMY    . TRANSMETATARSAL AMPUTATION Left 10/2013    Social History   Social History  . Marital status: Divorced    Spouse name: N/A  . Number of children: 3  . Years of education: College   Occupational History  . retired    Social History Main Topics  . Smoking status: Never Smoker  . Smokeless tobacco: Never Used  . Alcohol use 6.0 oz/week    10 Cans of beer per week     Comment: 08/28/2015 "under care of nutritionist who encourages me to drink 2  beer/day; never had h/o abuse"  . Drug use: No  . Sexual activity: No   Other Topics Concern  . Not on file   Social History Narrative    Divorced since 1994; lives by himself, 3 sons, 1 GD  (all live close by)   [place] veteran       Family History  Problem Relation Age of Onset  . Hyperlipidemia Mother   . Lung cancer Father     asbestos releated  . Colon polyps Father   . Diabetes Neg Hx   . Coronary artery disease Neg Hx   . Prostate cancer Neg Hx      Allergies as of 05/08/2016   No Known Allergies     Medication List       Accurate as of 05/08/16 11:59 PM.  Always use your most recent med list.          acetaminophen 325 MG tablet Commonly known as:  TYLENOL Take 650 mg by mouth every 6 (six) hours as needed (pain). Reported on 08/27/2015   ALPRAZolam 0.5 MG tablet Commonly known as:  XANAX Take 1 tablet (0.5 mg total) by mouth 3 (three) times daily.   amLODipine 5 MG tablet Commonly known as:  NORVASC Take 1 tablet (5 mg total) by mouth daily.   atorvastatin 40 MG tablet Commonly known as:  LIPITOR Take 1 tablet (40 mg total) by mouth at bedtime.   citalopram 20 MG tablet Commonly known as:  CELEXA Take 2 tablets (40 mg total) by mouth daily.   clopidogrel 75 MG tablet Commonly known as:  PLAVIX Take 75 mg by mouth daily.   fenofibrate 160 MG tablet Take 1 tablet (160 mg total) by mouth daily.   losartan 100 MG tablet Commonly known as:  COZAAR Take 1 tablet (100 mg total) by mouth daily.   METAMUCIL PO Take 2 capsules by mouth daily.   pregabalin 75 MG capsule Commonly known as:  LYRICA Take 1 capsule (75 mg total) by mouth 2 (two) times daily.   PROBIOTIC DAILY Caps Take 1 capsule by mouth daily.   RA ASPIRIN EC 81 MG EC tablet Generic drug:  aspirin Take 81 mg by mouth daily.   SYSTANE BALANCE OP Apply 1 drop to eye 2 (two) times daily as needed (dry eyes). Reported on 08/27/2015   tamsulosin 0.4 MG Caps capsule Commonly known as:  FLOMAX Take 1 capsule (0.4 mg total) by mouth daily.   traZODone 50 MG tablet Commonly known as:  DESYREL Take 1 tablet (50 mg total) by mouth at bedtime.   VITAMIN C & E COMBINATION PO Take 3,000 mg by mouth daily.          Objective:   Physical Exam  Neck:     BP 128/70 (BP Location: Right Arm, Patient Position: Sitting, Cuff Size: Large)   Pulse 61   Ht 5\' 9"  (1.753 m)   Wt 190 lb (86.2 kg)   SpO2 98%   BMI 28.06 kg/m   General:   Well developed, well nourished . NAD.  Neck: No  thyromegaly .  HEENT:  Normocephalic . Face symmetric, atraumatic Lungs:    CTA B Normal respiratory effort, no intercostal retractions, no accessory muscle use. Heart: RRR, mild, soft systolic murmur.  Abdomen:  Not distended, soft, non-tender. No rebound or rigidity.   Skin: Exposed areas without rash. Not pale. Not jaundice Neurologic:  alert & oriented X3.  Speech normal, gait unassisted but limited by history  of BKA. Strength symmetric and appropriate for age.  + Atrophy at the intrinsic muscles of the right hand Psych: Cognition and judgment appear intact.  Cooperative with normal attention span and concentration.  Behavior appropriate. No anxious or depressed appearing.    Assessment & Plan:   Assessement > Hyperglycemia,  (A1c 5.8  2013) HTN Hyperlipidemia Neuropathy , peripheral:  d/t ETOH (per neuro note, Dr Anne Hahn,  2014) NCS 05-2011: Moderate neuropathy, low-grade right L5 radiculopathy Bilateral ulnar neuropathy Anxiety, depression , insomnia onset 1995 after a divorce CV: --Aortic stenosis: Mild per echocardiogram 12-2014, has a soft murmur --PVD  -Used to see  Dr. Allyson Sabal . Pletal intolerant, ABIs normal but vessels are noncompressible.  -Now sees Dr Smith Robert [facility]), s/p angioplasty L dorsalis pedis ~ 09-2015 ---Carotid Artery  Dz: [place] [facility] 12-2015 --- [facility] test at [facility] 02/2016 (-) MSK: --Osteomyelitis --Multiple toes amputations 2014 --Left transmetatarsal amputation 01/03/2014 (osteomyelitis) --Right transmetatarsal amputation 09/12/2014 -- R BKA  09/28/2014 d/  Dehiscence midfoot amputation -- DJD cervical spondylosis  GI bleed, admited 08-2014 >>> EGD  esophageal ulcer (d/t nsaids and doxycycline ?) H/o Alcohol abuse Gets care at [facility] as well   PLAN Hyperglycemia: Diet control, check A1c HTN: Continue losartan, amlodipine. Check a BMP Hyperlipidemia: on Lipitor, last  LFT satisfactory, admits to very poor diet, check a FLP Anxiety, depression and insomnia: Well-controlled on Desyrel at night, citalopram and  Xanax as needed PVD-Carotid Dz: F/u at [facility]  Neck lump: CT done at the [facility], showed no malignancy. Likely d/y asymmetry of salivary glands Atrophy, R hand, was told before likely related to neck DJD, I agree. LUTS: Minimal sxs, continue Flomax RTC 6 months, fasting

## 2016-05-08 NOTE — Patient Instructions (Addendum)
GO TO THE LAB : Get the blood work     GO TO THE FRONT DESK Schedule your next appointment for a  follow-up, fasting in 6 months    Continue to eat heart healthy diet (full of fruits, vegetables, whole grains, lean protein, water--limit salt, fat, and sugar intake) and increase physical activity as tolerated.  Continue doing brain stimulating activities (puzzles, reading, adult coloring books, staying active) to keep memory sharp.

## 2016-05-10 NOTE — Assessment & Plan Note (Signed)
Hyperglycemia: Diet control, check A1c HTN: Continue losartan, amlodipine. Check a BMP Hyperlipidemia: on Lipitor, last  LFT satisfactory, admits to very poor diet, check a FLP Anxiety, depression and insomnia: Well-controlled on Desyrel at night, citalopram and Xanax as needed PVD-Carotid Dz: [facility]  [facility] lump: [facility], showed no malignancy. Likely d/y asymmetry of salivary glands Atrophy, R hand, was told before likely related to neck DJD, I agree. LUTS: Minimal sxs, continue Flomax RTC 6 months, fasting

## 2016-05-14 LAB — HM COLONOSCOPY

## 2016-05-15 ENCOUNTER — Telehealth: Payer: Self-pay | Admitting: Internal Medicine

## 2016-05-15 MED ORDER — PREGABALIN 75 MG PO CAPS: 75.0000 mg | ORAL_CAPSULE | Freq: Two times a day (BID) | ORAL | 1 refills | Status: DC

## 2016-05-15 NOTE — Telephone Encounter (Signed)
Spoke w/ Pt informed that [facility], informed that Lyrica is not yet available legally in the [place] generically. Pt stated he has been receiving it from the [facility] generically, I informed him I am unsure since I do not work w/ [facility] but per PCP and [facility] Lyrica is not available generically in [place] LEGALLY. Pt verbalized understanding.

## 2016-05-15 NOTE — Telephone Encounter (Signed)
°  Relation to AV:WUJWpt:self Call back number:[phone] [facility] [address]  Reason for call: pt is needing rx for his lyrica however pt is requesting if he could get the genericrx pragabalin. Pt states he only has 1 tab for today. Requesting a call back when rx has been sent

## 2016-05-15 NOTE — Telephone Encounter (Signed)
Lyrica not available in generic form in [place] as of yet. Rx printed, awaiting MD signature.

## 2016-05-22 ENCOUNTER — Other Ambulatory Visit: Payer: Self-pay | Admitting: Internal Medicine

## 2016-05-22 ENCOUNTER — Encounter: Payer: Self-pay | Admitting: Internal Medicine

## 2016-05-22 ENCOUNTER — Telehealth: Payer: Self-pay | Admitting: Internal Medicine

## 2016-05-22 MED ORDER — TRAZODONE HCL 50 MG PO TABS: 50.0000 mg | ORAL_TABLET | Freq: Every day | ORAL | 5 refills | Status: DC

## 2016-05-22 NOTE — Telephone Encounter (Signed)
Pt is requesting refill on Alprazolam.  Last OV: 05/08/2016 Last Fill: 03/24/2016 #90 and 1RF Pt sig: 1 tab TID PRN UDS: None  Please advise.

## 2016-05-22 NOTE — Telephone Encounter (Signed)
Rx faxed to [facility] pharmacy.  

## 2016-05-22 NOTE — Telephone Encounter (Signed)
Rx printed, awaiting MD signature.  

## 2016-05-22 NOTE — Telephone Encounter (Signed)
Trazodone refilled. [facility] this [facility] which has been forwarded to PCP for approval. Once approved, Rx will be sent.

## 2016-05-22 NOTE — Telephone Encounter (Signed)
Okay #90 and 1 refill 

## 2016-05-22 NOTE — Telephone Encounter (Signed)
Self.    Refill for ALPRAZolam and traZODone    Pharmacy: [id number] - [place], [place] - [address]

## 2016-06-03 ENCOUNTER — Other Ambulatory Visit: Payer: Self-pay | Admitting: Internal Medicine

## 2016-07-04 IMAGING — CT CT ANGIO CHEST
2 of 6 series · 19 of 36 positions shown · IV contrast (APPLIED)
Comparison: None.

CLINICAL DATA: Weakness.

EXAM:
CT ANGIOGRAPHY CHEST WITH CONTRAST
TECHNIQUE: Multidetector CT imaging of the chest was performed using the
standard protocol during bolus administration of intravenous
contrast. Multiplanar CT image reconstructions and MIPs were
obtained to evaluate the vascular anatomy.
CONTRAST:  100mL OMNIPAQUE IOHEXOL 350 MG/ML SOLN

[Series 6: pe 1.0 b26f · axial · 0.79mm/px · z∈[-327,-28]mm · 18 of 333 slices shown]
[im 17/333  lung]
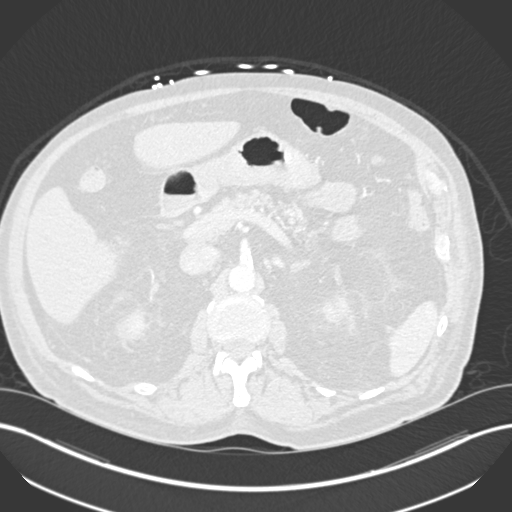
[im 34/333  mediastinal]
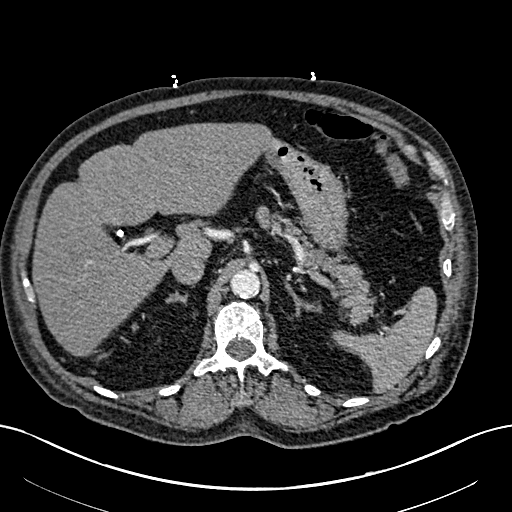
[im 50/333  lung]
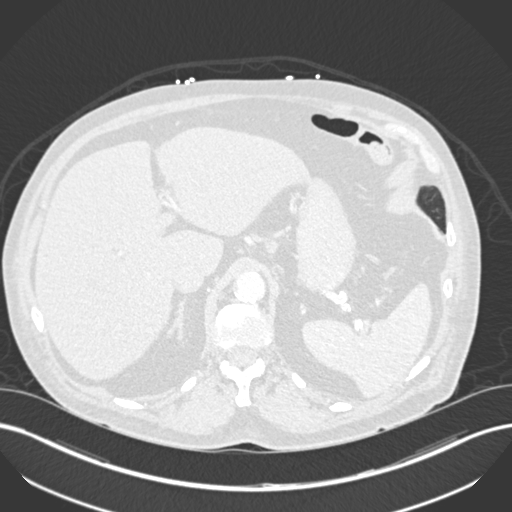
[im 67/333  mediastinal]
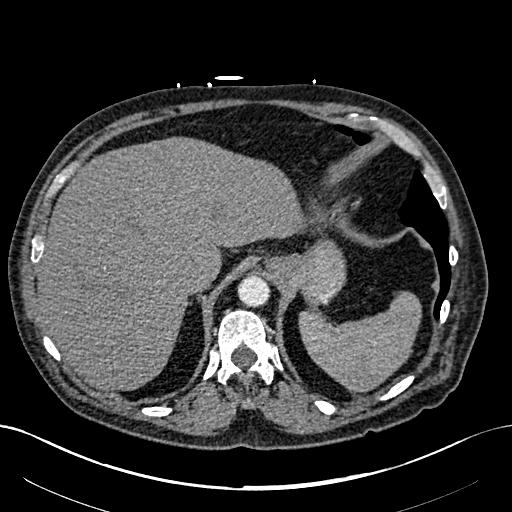
[im 84/333  lung]
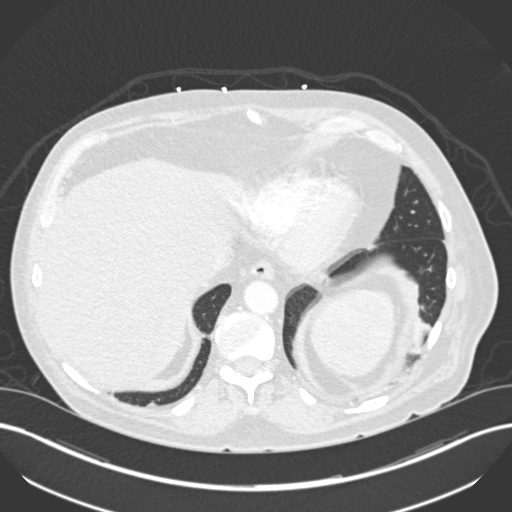
[im 100/333  mediastinal]
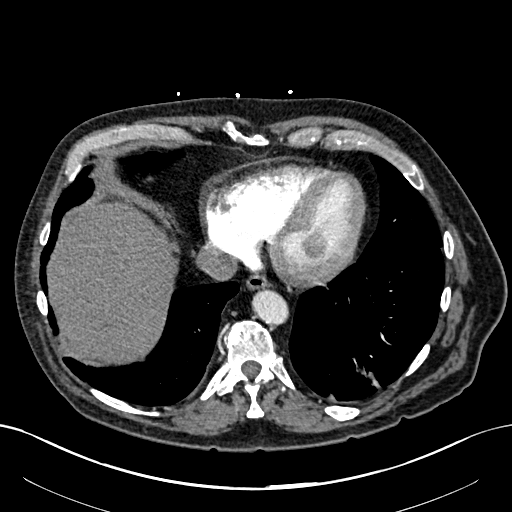
[im 117/333  lung]
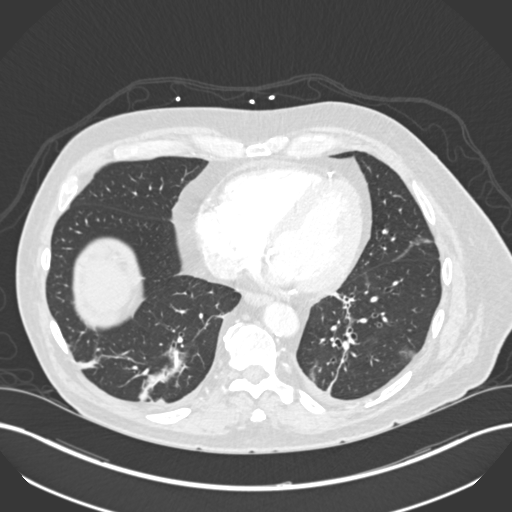
[im 133/333  mediastinal]
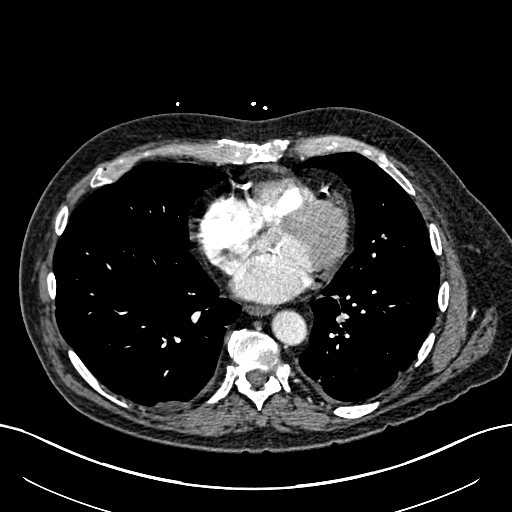
[im 150/333  lung]
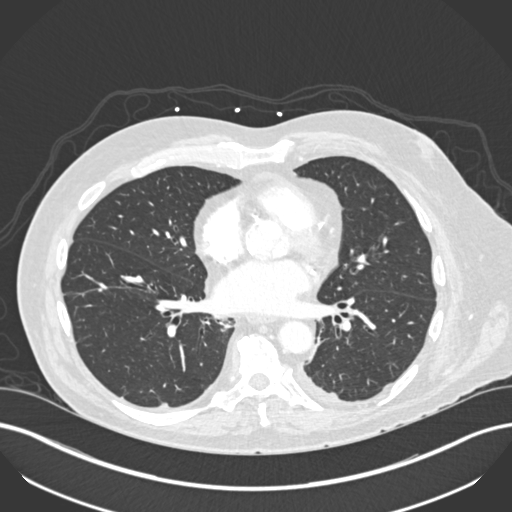
[im 183/333  mediastinal]
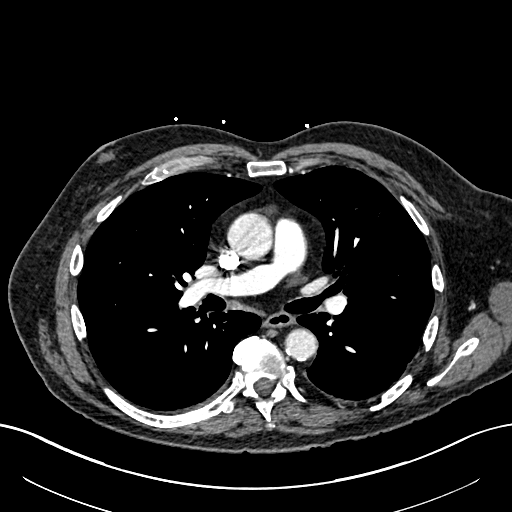
[im 200/333  lung]
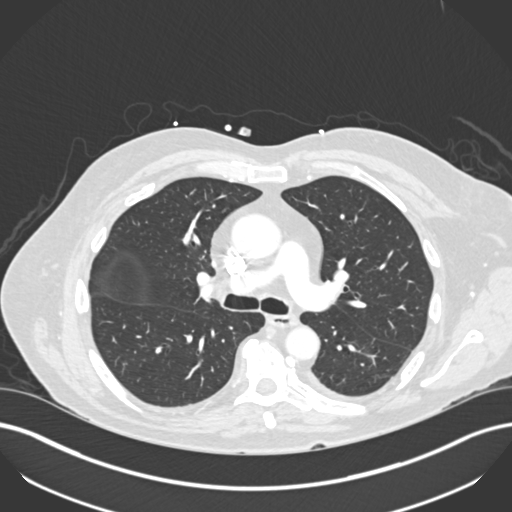
[im 216/333  mediastinal]
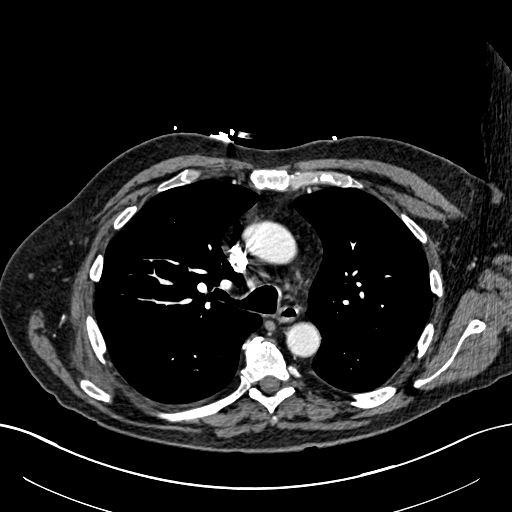
[im 233/333  lung]
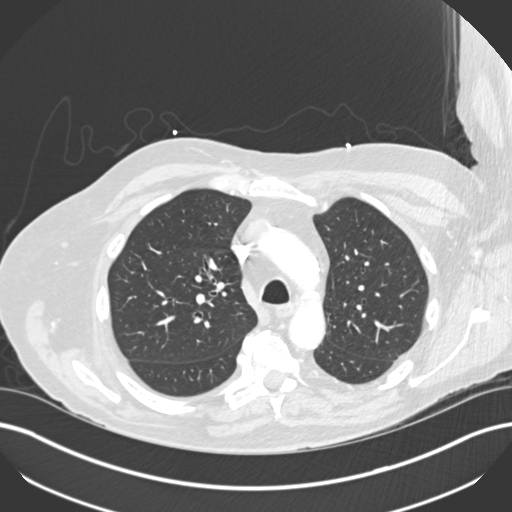
[im 250/333  mediastinal]
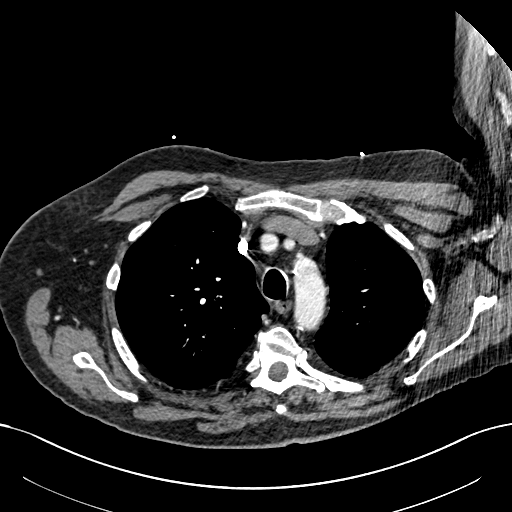
[im 266/333  lung]
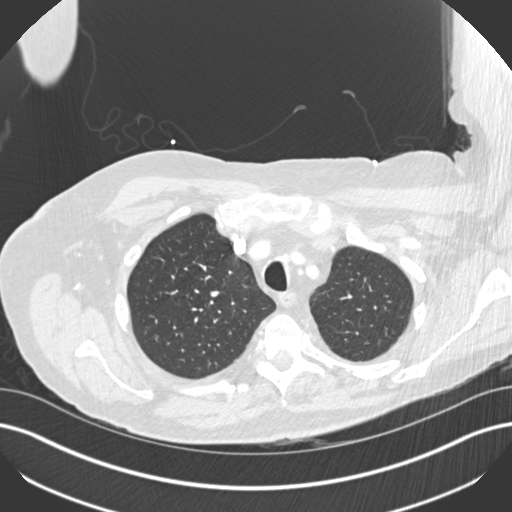
[im 283/333  mediastinal]
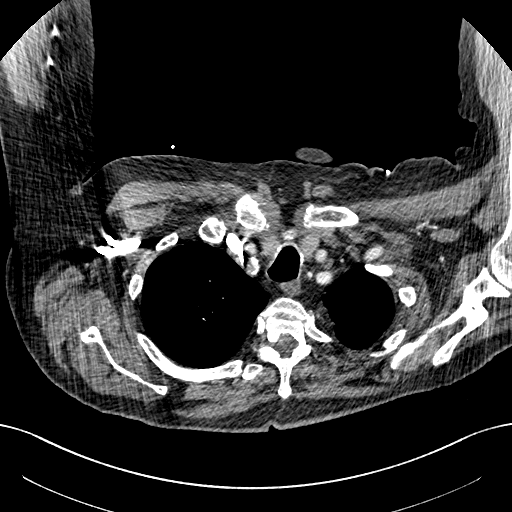
[im 299/333  lung]
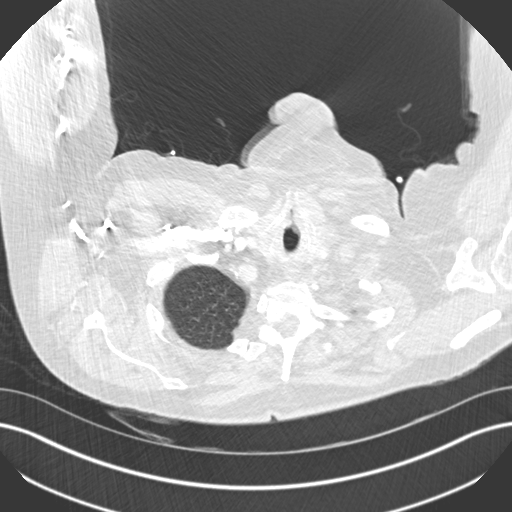
[im 316/333  mediastinal]
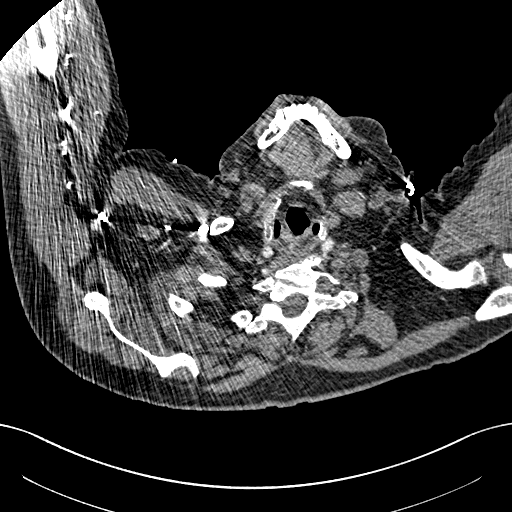

[Series 9: pe 2.0 coronal · coronal · 0.66mm/px · 1 of 123 slices shown]
[im 62/123  mediastinal]
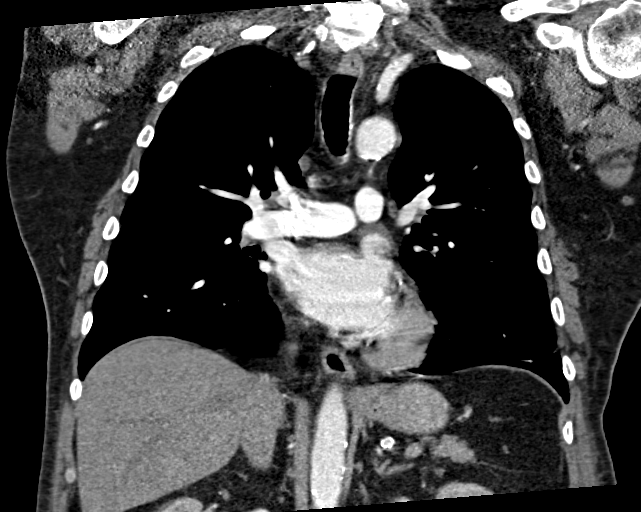

[19 of 36 positions shown; findings below may reference images not displayed]

FINDINGS: Anterior osteophyte formation is noted in mid thoracic spine. No
pneumothorax or pleural effusion is noted. Mild bilateral posterior
basilar subsegmental atelectasis or scarring is noted.
Atherosclerosis of thoracic aorta is noted without aneurysm or
dissection. Coronary artery calcifications are noted. There is no
definite evidence of pulmonary embolus. Visualized portion of upper
abdomen is unremarkable. No mediastinal mass or adenopathy is noted.

Review of the MIP images confirms the above findings.
IMPRESSION: No definite evidence of pulmonary embolus.

Atherosclerosis of thoracic aorta without aneurysm or dissection.

Mild bilateral posterior basilar subsegmental atelectasis or
scarring.

Coronary artery calcifications suggesting coronary artery disease.

## 2016-07-17 ENCOUNTER — Telehealth: Payer: Self-pay | Admitting: Internal Medicine

## 2016-07-17 MED ORDER — ALPRAZOLAM 0.5 MG PO TABS: 0.5000 mg | ORAL_TABLET | Freq: Three times a day (TID) | ORAL | 0 refills | Status: DC

## 2016-07-17 NOTE — Telephone Encounter (Signed)
Ok 90, no RF 

## 2016-07-17 NOTE — Telephone Encounter (Signed)
Rx printed, awaiting MD signature.  

## 2016-07-17 NOTE — Telephone Encounter (Signed)
Patient request call from nurse @ [phone]

## 2016-07-17 NOTE — Telephone Encounter (Signed)
Pt is requesting refill on Alprazolam.  Last OV: 05/08/2016 Last Fill: 05/22/2016 #90 and 1RF (Pt sig: 1 tab TID PRN) UDS: None   Please advise.

## 2016-07-17 NOTE — Telephone Encounter (Signed)
°  Relation to BM:WUXLpt:self Call back number:[phone] [facility]: [facility] [address] [place] - [address] [phone]) [phone] (Fax)    Reason for call:  Patient requesting a refill ALPRAZolam (XANAX) 0.5 MG tablet, states he will run out this weekend, please advise

## 2016-07-17 NOTE — Telephone Encounter (Signed)
Rx faxed to [facility] pharmacy.  

## 2016-08-16 ENCOUNTER — Other Ambulatory Visit: Payer: Self-pay | Admitting: Internal Medicine

## 2016-08-17 NOTE — Telephone Encounter (Signed)
Rx faxed to [facility] pharmacy.  

## 2016-08-17 NOTE — Telephone Encounter (Signed)
Rx printed, awaiting MD signature.  

## 2016-08-17 NOTE — Telephone Encounter (Signed)
Pt is requesting refill on alprazolam 0.5 mg.  Last OV: 05/08/2016 Last Fill: 07/17/2016 #90 and 0RF 1 tab TID PRN UDS: None  Please advise.

## 2016-08-17 NOTE — Telephone Encounter (Signed)
Okay #90, no refills 

## 2016-08-26 ENCOUNTER — Other Ambulatory Visit: Payer: Self-pay | Admitting: Internal Medicine

## 2016-09-14 ENCOUNTER — Other Ambulatory Visit: Payer: Self-pay | Admitting: Internal Medicine

## 2016-09-14 NOTE — Telephone Encounter (Signed)
Pt is requesting refill on alprazolam 0.5mg .  Last OV: 05/08/2016 Last Fill: 08/17/2016 #90 and 0RF (Pt sig: 1 tab TID) Pt requesting 2 days early UDS: None  [place] Controlled Substance Database printed:  Lyrica 75mg  prescribed routinely by Dr. Burton ApleyLiliana Pantea  Pt also requesting refills on this prescription as he will be out of town when next refill is due?  Please advise.

## 2016-09-14 NOTE — Telephone Encounter (Signed)
Relation to EA:VWUJpt:self Call back number:[phone] Pharmacy:  [facility] [address] [place] - [address] [phone] (Phone) [phone])      Reason for call:  Patient requesting ALPRAZolam Prudy Feeler(XANAX) 0.5 MG tablet refill with 1 refill due to patient being out of town when medication runs out, please advise

## 2016-09-14 NOTE — Telephone Encounter (Signed)
Rx printed, awaiting MD signature.  

## 2016-09-14 NOTE — Telephone Encounter (Signed)
Ok 90 and 1 RF 

## 2016-09-14 NOTE — Telephone Encounter (Signed)
Rx faxed to [facility] pharmacy.  

## 2016-11-16 ENCOUNTER — Other Ambulatory Visit: Payer: Self-pay | Admitting: Internal Medicine

## 2016-11-16 NOTE — Telephone Encounter (Signed)
Rx faxed to [facility] pharmacy.  

## 2016-11-16 NOTE — Telephone Encounter (Signed)
Ok 90 and 1 RF 

## 2016-11-16 NOTE — Telephone Encounter (Signed)
Pt is requesting refill on alprazolam 0.5mg .  Last OV: 05/08/2016 Last Fill: 09/14/2016 #90 and 1RF (Pt sig: 1 tab tid prn) UDS: None  [place] database 09/17/2016 (in media); no issues noted  Please advise.

## 2016-11-30 ENCOUNTER — Other Ambulatory Visit: Payer: Self-pay | Admitting: Internal Medicine

## 2017-01-04 ENCOUNTER — Telehealth: Payer: Self-pay | Admitting: *Deleted

## 2017-01-04 NOTE — Telephone Encounter (Signed)
[facility] at [phone]. Form sent for scanning.

## 2017-01-04 NOTE — Telephone Encounter (Signed)
Received Provider Query from [employer] for Medical Record Clarification on Depression for Coding purposes, OV note attached; forwarded to provider/SLS 10/29   

## 2017-01-13 ENCOUNTER — Telehealth: Payer: Self-pay | Admitting: Internal Medicine

## 2017-01-14 NOTE — Telephone Encounter (Signed)
Pt has appt tomorrow 01/15/17 he would like to know if he needs to be fasting for this appt. Also, he wants to pick up alprazolam today he does not want to wait until appt tomorrow.

## 2017-01-14 NOTE — Telephone Encounter (Signed)
Yes, Pt should fast, also PCP has already left for the day- alprazolam would be refilled tomorrow.

## 2017-01-15 ENCOUNTER — Ambulatory Visit: Payer: Medicare Other | Admitting: Internal Medicine

## 2017-01-15 ENCOUNTER — Encounter: Payer: Self-pay | Admitting: Internal Medicine

## 2017-01-15 VITALS — BP 116/72 | HR 61 | Temp 98.0°F | Resp 14 | Ht 69.0 in | Wt 196.0 lb

## 2017-01-15 DIAGNOSIS — F329 Major depressive disorder, single episode, unspecified: Secondary | ICD-10-CM

## 2017-01-15 DIAGNOSIS — F419 Anxiety disorder, unspecified: Secondary | ICD-10-CM | POA: Diagnosis not present

## 2017-01-15 DIAGNOSIS — I10 Essential (primary) hypertension: Secondary | ICD-10-CM | POA: Diagnosis not present

## 2017-01-15 DIAGNOSIS — K5900 Constipation, unspecified: Secondary | ICD-10-CM

## 2017-01-15 DIAGNOSIS — E785 Hyperlipidemia, unspecified: Secondary | ICD-10-CM | POA: Diagnosis not present

## 2017-01-15 LAB — COMPREHENSIVE METABOLIC PANEL
ALK PHOS: 28 U/L — AB (ref 39–117)
ALT: 14 U/L (ref 0–53)
AST: 18 U/L (ref 0–37)
Albumin: 4.3 g/dL (ref 3.5–5.2)
BUN: 20 mg/dL (ref 6–23)
CHLORIDE: 106 meq/L (ref 96–112)
CO2: 27 meq/L (ref 19–32)
Calcium: 9.7 mg/dL (ref 8.4–10.5)
Creatinine, Ser: 1.12 mg/dL (ref 0.40–1.50)
GFR: 67.87 mL/min (ref >60.00)
GLUCOSE: 87 mg/dL (ref 70–99)
POTASSIUM: 4.2 meq/L (ref 3.5–5.1)
Sodium: 140 mEq/L (ref 135–145)
Total Bilirubin: 0.8 mg/dL (ref 0.2–1.2)
Total Protein: 6.6 g/dL (ref 6.0–8.3)

## 2017-01-15 LAB — CBC WITH DIFFERENTIAL/PLATELET
Basophils Absolute: 0 10*3/uL (ref 0.0–0.1)
Basophils Relative: 0.7 % (ref 0.0–3.0)
EOS PCT: 2.1 % (ref 0.0–5.0)
Eosinophils Absolute: 0.1 10*3/uL (ref 0.0–0.7)
HCT: 41.9 % (ref 39.0–52.0)
Hemoglobin: 13.8 g/dL (ref 13.0–17.0)
LYMPHS ABS: 1.9 10*3/uL (ref 0.7–4.0)
Lymphocytes Relative: 39.8 % (ref 12.0–46.0)
MCHC: 33 g/dL (ref 30.0–36.0)
MCV: 95.2 fl (ref 78.0–100.0)
MONO ABS: 0.6 10*3/uL (ref 0.1–1.0)
Monocytes Relative: 11.6 % (ref 3.0–12.0)
NEUTROS PCT: 45.8 % (ref 43.0–77.0)
Neutro Abs: 2.2 10*3/uL (ref 1.4–7.7)
Platelets: 172 10*3/uL (ref 150.0–400.0)
RBC: 4.41 Mil/uL (ref 4.22–5.81)
RDW: 13.7 % (ref 11.5–15.5)
WBC: 4.8 10*3/uL (ref 4.0–10.5)

## 2017-01-15 LAB — LIPID PANEL
Cholesterol: 157 mg/dL (ref 0–200)
HDL: 63.9 mg/dL (ref >39.00)
LDL Cholesterol: 80 mg/dL (ref 0–99)
NONHDL: 93.05
Total CHOL/HDL Ratio: 2
Triglycerides: 67 mg/dL (ref 0.0–149.0)
VLDL: 13.4 mg/dL (ref 0.0–40.0)

## 2017-01-15 MED ORDER — LOSARTAN POTASSIUM 100 MG PO TABS: 100.0000 mg | ORAL_TABLET | Freq: Every day | ORAL | 5 refills | Status: DC

## 2017-01-15 MED ORDER — ALPRAZOLAM 0.5 MG PO TABS: 0.5000 mg | ORAL_TABLET | Freq: Three times a day (TID) | ORAL | 3 refills | Status: DC

## 2017-01-15 MED ORDER — CITALOPRAM HYDROBROMIDE 20 MG PO TABS: 40.0000 mg | ORAL_TABLET | Freq: Every day | ORAL | 1 refills | Status: DC

## 2017-01-15 MED ORDER — AMLODIPINE BESYLATE 5 MG PO TABS: 5.0000 mg | ORAL_TABLET | Freq: Every day | ORAL | 5 refills | Status: DC

## 2017-01-15 MED ORDER — ATORVASTATIN CALCIUM 40 MG PO TABS: 40.0000 mg | ORAL_TABLET | Freq: Every day | ORAL | 5 refills | Status: DC

## 2017-01-15 MED ORDER — FENOFIBRATE 160 MG PO TABS: 160.0000 mg | ORAL_TABLET | Freq: Every day | ORAL | 5 refills | Status: DC

## 2017-01-15 NOTE — Progress Notes (Signed)
Pre visit review using our clinic review tool, if applicable. No additional management support is needed unless otherwise documented below in the visit note. 

## 2017-01-15 NOTE — Patient Instructions (Signed)
GO TO THE LAB : Get the blood work     GO TO THE FRONT DESK Schedule your next appointment for a physical exam by March 2019   

## 2017-01-15 NOTE — Progress Notes (Signed)
Subjective:    Patient ID: Gene [name], male    DOB: Aug 12, 1940, 76 y.o.   MRN: [id number]  DOS:  01/15/2017 Type of visit - description : rov Interval history: In general doing well, good compliance w/ medications. Reports some constipation over the last 2 months, recently decided to increase his fiber intake, taking Metamucil.  Also increase his water intake. Does not take any new medications. HTN: Good compliance of medication, ambulatory BPs normal. Anxiety, insomnia: Under excellent control, needs a UDS and contract. High cholesterol: Last LDL reviewed.    Review of Systems  Denies chest pain or difficulty breathing No nausea, vomiting, blood in the stools or abdominal pain. EtOH: 2 beers at night.  Past Medical History:  Diagnosis Date  . Anemia    "just in 2016 when I was bleeding internally" (08/28/2015)  . Anxiety   . Aortic stenosis   . Arthritis   . Cervical spondylolysis   . Depression   . Diabetic ulcer of left foot (HCC) 08/28/2015  . Family history of anesthesia complication    "father would start seeing things"  . Heart murmur    "related to the aortic stenosis" (08/28/2015)  . History of alcohol abuse   . History of blood transfusion    "just in 2016 when I was bleeding internally" (08/28/2015)  . Hyperlipidemia    high TG  . Hypertension   . Lesion of ulnar nerve 07/22/2012   Bilateral ulnar neuropathies  . Neuropathy   . 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   left  . PVD (peripheral vascular disease) (HCC)    PVD, mild saw Dr Allyson SabalBerry 2012, intolerant to pletal, Rx observation  . Ulcer of esophagus 2016   "caused by positioning related to foot wound"    Past Surgical History:  Procedure Laterality Date  . AMPUTATION Left 11/20/13 and 11/24/13   middle  . CATARACT EXTRACTION W/ INTRAOCULAR LENS  IMPLANT, BILATERAL Bilateral 05/2015  . LAPAROSCOPIC CHOLECYSTECTOMY    . TONSILLECTOMY    . TRANSMETATARSAL AMPUTATION Left 10/2013     Social History   Socioeconomic History  . Marital status: Divorced    Spouse name: Not on file  . Number of children: 3  . Years of education: College  . Highest education level: Not on file  Social Needs  . Financial resource strain: Not on file  . Food insecurity - worry: Not on file  . Food insecurity - inability: Not on file  . Transportation needs - medical: Not on file  . Transportation needs - non-medical: Not on file  Occupational History  . Occupation: retired  Tobacco Use  . Smoking status: Never Smoker  . Smokeless tobacco: Never Used  Substance and Sexual Activity  . Alcohol use: Yes    Alcohol/week: 6.0 oz    Types: 10 Cans of beer per week    Comment: 08/28/2015 "under care of nutritionist who encourages me to drink 2 beer/day; never had h/o abuse"  . Drug use: No  . Sexual activity: No  Other Topics Concern  . Not on file  Social History Narrative    Divorced since 1994; lives by himself, 3 sons, 1 GD  (all live close by)   [place]        Allergies as of 01/15/2017   No Known Allergies     Medication List        Accurate as of 01/15/17 11:59 PM. Always use your most recent med list.  acetaminophen 325 MG tablet Commonly known as:  TYLENOL Take 650 mg by mouth every 6 (six) hours as needed (pain). Reported on 08/27/2015   ALPRAZolam 0.5 MG tablet Commonly known as:  XANAX Take 1 tablet (0.5 mg total) 3 (three) times daily by mouth.   amLODipine 5 MG tablet Commonly known as:  NORVASC Take 1 tablet (5 mg total) daily by mouth.   atorvastatin 40 MG tablet Commonly known as:  LIPITOR Take 1 tablet (40 mg total) at bedtime by mouth.   citalopram 20 MG tablet Commonly known as:  CELEXA Take 2 tablets (40 mg total) daily by mouth.   clopidogrel 75 MG tablet Commonly known as:  PLAVIX Take 75 mg by mouth daily.   fenofibrate 160 MG tablet Take 1 tablet (160 mg total) daily by mouth.   losartan 100 MG tablet Commonly  known as:  COZAAR Take 1 tablet (100 mg total) daily by mouth.   METAMUCIL PO Take 2 capsules by mouth daily.   pregabalin 75 MG capsule Commonly known as:  LYRICA Take 1 capsule (75 mg total) by mouth 2 (two) times daily.   PROBIOTIC DAILY Caps Take 1 capsule by mouth daily.   RA ASPIRIN EC 81 MG EC tablet Generic drug:  aspirin Take 81 mg by mouth daily.   SYSTANE BALANCE OP Apply 1 drop to eye 2 (two) times daily as needed (dry eyes). Reported on 08/27/2015   tamsulosin 0.4 MG Caps capsule Commonly known as:  FLOMAX Take 1 capsule (0.4 mg total) by mouth daily.   traZODone 50 MG tablet Commonly known as:  DESYREL Take 1 tablet (50 mg total) by mouth at bedtime.   VITAMIN C PO Take 3,000 mg daily by mouth.          Objective:   Physical Exam BP 116/72 (BP Location: Left Arm, Patient Position: Sitting, Cuff Size: Normal)   Pulse 61   Temp 98 F (36.7 C) (Oral)   Resp 14   Ht 5\' 9"  (1.753 m)   Wt 196 lb (88.9 kg)   SpO2 95%   BMI 28.94 kg/m  General:   Well developed, well nourished . NAD.  HEENT:  Normocephalic . Face symmetric, atraumatic Lungs:  CTA B Normal respiratory effort, no intercostal retractions, no accessory muscle use. Heart: RRR,  no murmur.  No R pretibial edema    Skin: Not pale. Not jaundice Neurologic:  alert & oriented X3.  Speech normal  Psych--  Cognition and judgment appear intact.  Cooperative with normal attention span and concentration.  Behavior appropriate. No anxious or depressed appearing.      Assessment & Plan:   Assessement > Hyperglycemia,  (A1c 5.8  2013) HTN Hyperlipidemia Neuropathy , peripheral:  d/t ETOH (per neuro note, Dr Anne HahnWillis,  2014) NCS 05-2011: Moderate neuropathy, low-grade right L5 radiculopathy Bilateral ulnar neuropathy Anxiety, depression , insomnia onset 1995 after a divorce CV: --Aortic stenosis: Mild per echocardiogram 12-2014, has a soft murmur --PVD  -Used to see  Dr. Allyson SabalBerry . Pletal  intolerant, ABIs normal but vessels are noncompressible.  -Now sees Dr Smith Robertao [facility]), s/p angioplasty L dorsalis pedis ~ 09-2015 ---Carotid Artery  [facility] --- [facility] MSK: --Osteomyelitis --Multiple toes amputations 2014 --Left transmetatarsal amputation 01/03/2014 (osteomyelitis) --Right transmetatarsal amputation 09/12/2014 -- R BKA  09/28/2014 d/  Dehiscence midfoot amputation -- DJD cervical spondylosis  GI bleed, admited 08-2014 >>> EGD  esophageal ulcer (d/t nsaids and doxycycline ?)  H/o Alcohol abuse Gets care at the [facility] as well   PLAN HTN: Seems well controlled on amlodipine, losartan, check a CMP Hyperlipidemia: Last LDL was over 70, continue Lipitor, fenofibrate, check FLP Anxiety, depression, insomnia: Currently controlled on Xanax, citalopram and trazodone; UDS and contract today. Constipation: New problem, just started 2 months ago, states that he already increase his fiber and water intake and will let me know if the problem is not self resolving.  He denies blood in the stools, see ROS. primary care: Had his flu shot already  RTC, CPX 3-19

## 2017-01-15 NOTE — Progress Notes (Signed)
[facility]

## 2017-01-17 NOTE — Assessment & Plan Note (Signed)
HTN: Seems well controlled on amlodipine, losartan, check a CMP Hyperlipidemia: Last LDL was over 70, continue Lipitor, fenofibrate, check FLP Anxiety, depression, insomnia: Currently controlled on Xanax, citalopram and trazodone; UDS and contract today. Constipation: New problem, just started 2 months ago, states that he already increase his fiber and water intake and will let me know if the problem is not self resolving.  He denies blood in the stools, see ROS. primary care: Had his flu shot already  RTC, CPX 3-19

## 2017-01-18 LAB — PAIN MGMT, PROFILE 8 W/CONF, U
6 ACETYLMORPHINE: NEGATIVE ng/mL (ref <10)
ALPHAHYDROXYALPRAZOLAM: 435 ng/mL — AB (ref <25)
ALPHAHYDROXYTRIAZOLAM: NEGATIVE ng/mL (ref <50)
Alcohol Metabolites: POSITIVE ng/mL — AB (ref <500)
Alphahydroxymidazolam: NEGATIVE ng/mL (ref <50)
Aminoclonazepam: NEGATIVE ng/mL (ref <25)
Amphetamines: NEGATIVE ng/mL (ref <500)
BENZODIAZEPINES: POSITIVE ng/mL — AB (ref <100)
Buprenorphine, Urine: NEGATIVE ng/mL (ref <5)
CREATININE: 131.1 mg/dL
Cocaine Metabolite: NEGATIVE ng/mL (ref <150)
Ethyl Glucuronide (ETG): 6325 ng/mL — ABNORMAL HIGH (ref <500)
Ethyl Sulfate (ETS): 2250 ng/mL — ABNORMAL HIGH (ref <100)
HYDROXYETHYLFLURAZEPAM: NEGATIVE ng/mL (ref <50)
Lorazepam: NEGATIVE ng/mL (ref <50)
MARIJUANA METABOLITE: NEGATIVE ng/mL (ref <20)
MDA: NEGATIVE ng/mL (ref <200)
MDMA: NEGATIVE ng/mL (ref <200)
MDMA: NEGATIVE ng/mL (ref <500)
NORDIAZEPAM: NEGATIVE ng/mL (ref <50)
OPIATES: NEGATIVE ng/mL (ref <100)
OXAZEPAM: NEGATIVE ng/mL (ref <50)
OXYCODONE: NEGATIVE ng/mL (ref <100)
Oxidant: NEGATIVE ug/mL (ref <200)
PH: 5.55 (ref 4.5–9.0)
Temazepam: NEGATIVE ng/mL (ref <50)

## 2017-04-18 ENCOUNTER — Encounter (HOSPITAL_BASED_OUTPATIENT_CLINIC_OR_DEPARTMENT_OTHER): Payer: Self-pay | Admitting: *Deleted

## 2017-04-18 ENCOUNTER — Emergency Department (HOSPITAL_BASED_OUTPATIENT_CLINIC_OR_DEPARTMENT_OTHER): Payer: Medicare Other

## 2017-04-18 ENCOUNTER — Emergency Department (HOSPITAL_BASED_OUTPATIENT_CLINIC_OR_DEPARTMENT_OTHER)
Admission: EM | Admit: 2017-04-18 | Discharge: 2017-04-18 | Disposition: A | Discharge status: Home / Self Care | Payer: Medicare Other | Attending: Emergency Medicine | Admitting: Emergency Medicine

## 2017-04-18 ENCOUNTER — Other Ambulatory Visit: Payer: Self-pay

## 2017-04-18 DIAGNOSIS — M25511 Pain in right shoulder: Secondary | ICD-10-CM

## 2017-04-18 DIAGNOSIS — Z8679 Personal history of other diseases of the circulatory system: Secondary | ICD-10-CM | POA: Diagnosis not present

## 2017-04-18 DIAGNOSIS — S4991XA Unspecified injury of right shoulder and upper arm, initial encounter: Secondary | ICD-10-CM | POA: Insufficient documentation

## 2017-04-18 DIAGNOSIS — Y998 Other external cause status: Secondary | ICD-10-CM | POA: Insufficient documentation

## 2017-04-18 DIAGNOSIS — Z79899 Other long term (current) drug therapy: Secondary | ICD-10-CM | POA: Insufficient documentation

## 2017-04-18 DIAGNOSIS — Z7902 Long term (current) use of antithrombotics/antiplatelets: Secondary | ICD-10-CM | POA: Insufficient documentation

## 2017-04-18 DIAGNOSIS — I10 Essential (primary) hypertension: Secondary | ICD-10-CM | POA: Diagnosis not present

## 2017-04-18 DIAGNOSIS — Z89422 Acquired absence of other left toe(s): Secondary | ICD-10-CM | POA: Diagnosis not present

## 2017-04-18 DIAGNOSIS — W010XXA Fall on same level from slipping, tripping and stumbling without subsequent striking against object, initial encounter: Secondary | ICD-10-CM | POA: Diagnosis not present

## 2017-04-18 DIAGNOSIS — Z89511 Acquired absence of right leg below knee: Secondary | ICD-10-CM | POA: Insufficient documentation

## 2017-04-18 DIAGNOSIS — Y92009 Unspecified place in unspecified non-institutional (private) residence as the place of occurrence of the external cause: Secondary | ICD-10-CM | POA: Diagnosis not present

## 2017-04-18 DIAGNOSIS — Y939 Activity, unspecified: Secondary | ICD-10-CM | POA: Insufficient documentation

## 2017-04-18 MED ORDER — ACETAMINOPHEN 325 MG PO TABS
650.0000 mg | ORAL_TABLET | Freq: Once | ORAL | Status: AC
  Administered 2017-04-18: 650 mg via ORAL
  Filled 2017-04-18: qty 2

## 2017-04-18 NOTE — ED Triage Notes (Signed)
Pt reports he fell last night. C/o right shoulder pain, ?dislocation. Pt able to raise arm to shoulder level and bend at elbow.

## 2017-04-18 NOTE — ED Provider Notes (Signed)
[facility] EMERGENCY DEPARTMENT Provider Note   CSN: [id number] Arrival date & time: 04/18/17  1355     History   Chief Complaint Chief Complaint  Patient presents with  . Fall    HPI Gene [name] is a 77 y.o. male presenting to ED with acute onset of right shoulder pain s/p mechanical fall that occurred yesterday. Pt states he tripped over a rug and fell down onto his right side. Localizes pain to anterior aspect of right shoulder, worse with lifting arm. Denies head trauma or LOC. No other injuries. No previous injuries to right shoulder.  The history is provided by the patient.    Past Medical History:  Diagnosis Date  . Anemia    "just in 2016 when I was bleeding internally" (08/28/2015)  . Anxiety   . Aortic stenosis   . Arthritis   . Cervical spondylolysis   . Depression   . Diabetic ulcer of left foot (HCC) 08/28/2015   pt states he does not have diabetes  . Family history of anesthesia complication    "father would start seeing things"  . Heart murmur    "related to the aortic stenosis" (08/28/2015)  . History of alcohol abuse   . History of blood transfusion    "just in 2016 when I was bleeding internally" (08/28/2015)  . Hyperlipidemia    high TG  . Hypertension   . Lesion of ulnar nerve 07/22/2012   Bilateral ulnar neuropathies  . Neuropathy   . 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   left  . PVD (peripheral vascular disease) (HCC)    PVD, mild saw Dr Allyson Sabal 2012, intolerant to pletal, Rx observation  . Ulcer of esophagus 2016   "caused by positioning related to foot wound"    Patient Active Problem List   Diagnosis Date Noted  . Mild aortic stenosis 12/30/2015  . Bruit of left carotid artery 10/28/2015  . Diabetic foot ulcer (HCC) 08/29/2015  . Dyslipidemia 08/28/2015  . PCP NOTES >>>> 01/08/2015  . S/P transmetatarsal amputation of foot (HCC) 01/03/2014  . PVD (peripheral vascular disease) (HCC) 01/20/2013  . Peripheral  neuropathy (HCC) 04/17/2011  . Annual physical exam 09/26/2010  . Anxiety and depression 06/11/2006  . HTN (hypertension) 06/11/2006    Past Surgical History:  Procedure Laterality Date  . AMPUTATION Left 11/20/13 and 11/24/13   middle  . AMPUTATION Left 01/03/2014   Procedure: Left Transmetatarsal Amputation;  Surgeon: Nadara Mustard, MD;  Location: [facility] OR;  Service: Orthopedics;  Laterality: Left;  . AMPUTATION Right 07/27/2014   Procedure: Right 4th Ray Amputation;  Surgeon: Nadara Mustard, MD;  Location: [facility] OR;  Service: Orthopedics;  Laterality: Right;  . AMPUTATION Right 09/12/2014   Procedure: RIGHT TRANSMETATARSAL AMPUTATION;  Surgeon: Nadara Mustard, MD;  Location: [facility] OR;  Service: Orthopedics;  Laterality: Right;  . AMPUTATION Right 09/28/2014   Procedure: Right Below Knee Amputation;  Surgeon: Nadara Mustard, MD;  Location: [facility] OR;  Service: Orthopedics;  Laterality: Right;  . CATARACT EXTRACTION W/ INTRAOCULAR LENS  IMPLANT, BILATERAL Bilateral 05/2015  . ESOPHAGOGASTRODUODENOSCOPY N/A 08/16/2014   Procedure: ESOPHAGOGASTRODUODENOSCOPY (EGD);  Surgeon: Vida Rigger, MD;  Location: [facility] ENDOSCOPY;  Service: Endoscopy;  Laterality: N/A;  . ESOPHAGOGASTRODUODENOSCOPY (EGD) WITH PROPOFOL N/A 08/17/2014   Procedure: ESOPHAGOGASTRODUODENOSCOPY (EGD) WITH PROPOFOL;  Surgeon: Graylin Shiver, MD;  Location: [facility] ENDOSCOPY;  Service: Endoscopy;  Laterality: N/A;  . LAPAROSCOPIC CHOLECYSTECTOMY    . TONSILLECTOMY    .  TRANSMETATARSAL AMPUTATION Left 10/2013       Home Medications    Prior to Admission medications   Medication Sig Start Date End Date Taking? Authorizing Provider  acetaminophen (TYLENOL) 325 MG tablet Take 650 mg by mouth every 6 (six) hours as needed (pain). Reported on 08/27/2015   Yes [provider]  ALPRAZolam Prudy Feeler) 0.5 MG tablet Take 1 tablet (0.5 mg total) 3 (three) times daily by mouth. 01/15/17  Yes Paz, Nolon Rod, MD  amLODipine (NORVASC) 5 MG tablet Take 1 tablet (5 mg  total) daily by mouth. 01/15/17  Yes Paz, Nolon Rod, MD  Ascorbic Acid (VITAMIN C PO) Take 3,000 mg daily by mouth.    Yes [provider]  atorvastatin (LIPITOR) 40 MG tablet Take 1 tablet (40 mg total) at bedtime by mouth. 01/15/17  Yes Paz, Nolon Rod, MD  citalopram (CELEXA) 20 MG tablet Take 2 tablets (40 mg total) daily by mouth. 01/15/17  Yes Paz, Nolon Rod, MD  clopidogrel (PLAVIX) 75 MG tablet Take 75 mg by mouth daily.   Yes [provider]  fenofibrate 160 MG tablet Take 1 tablet (160 mg total) daily by mouth. 01/15/17  Yes Paz, Nolon Rod, MD  losartan (COZAAR) 100 MG tablet Take 1 tablet (100 mg total) daily by mouth. 01/15/17  Yes Paz, Nolon Rod, MD  pregabalin (LYRICA) 75 MG capsule Take 1 capsule (75 mg total) by mouth 2 (two) times daily. 05/15/16  Yes Paz, Nolon Rod, MD  Propylene Glycol (SYSTANE BALANCE OP) Apply 1 drop to eye 2 (two) times daily as needed (dry eyes). Reported on 08/27/2015   Yes [provider]  Psyllium (METAMUCIL PO) Take 2 capsules by mouth daily.   Yes [provider]  RA ASPIRIN EC 81 MG EC tablet Take 81 mg by mouth daily. 12/06/14  Yes [provider]  tamsulosin (FLOMAX) 0.4 MG CAPS capsule Take 1 capsule (0.4 mg total) by mouth daily. Patient taking differently: Take 0.8 mg daily by mouth.  06/03/16  Yes Paz, Nolon Rod, MD  traZODone (DESYREL) 50 MG tablet Take 1 tablet (50 mg total) by mouth at bedtime. 05/22/16  Yes [name], MD  Probiotic Product (PROBIOTIC DAILY) CAPS Take 1 capsule by mouth daily.    [provider]    Family History Family History  Problem Relation Age of Onset  . Hyperlipidemia Mother   . Lung cancer Father        asbestos releated  . Colon polyps Father   . Diabetes Neg Hx   . Coronary artery disease Neg Hx   . Prostate cancer Neg Hx     Social History Social History   Tobacco Use  . Smoking status: Never Smoker  . Smokeless tobacco: Never Used  Substance Use Topics  . Alcohol use: Yes     Alcohol/week: 6.0 oz    Types: 10 Cans of beer per week    Comment: 08/28/2015 "under care of nutritionist who encourages me to drink 2 beer/day; never had h/o abuse"  . Drug use: No     Allergies   Patient has no known allergies.   Review of Systems Review of Systems  Musculoskeletal: Positive for arthralgias.  Skin: Positive for wound.  Neurological: Negative for syncope and numbness.  All other systems reviewed and are negative.    Physical Exam Updated Vital Signs BP (!) 152/67 (BP Location: Left Arm)   Pulse 83   Temp 98.3 F (36.8 C) (Oral)  Resp 20   Ht 5\' 9"  (1.753 m)   Wt 88.9 kg (196 lb)   SpO2 97%   BMI 28.94 kg/m   Physical Exam  Constitutional: He appears well-developed and well-nourished. No distress.  HENT:  Head: Normocephalic and atraumatic.  Eyes: Conjunctivae are normal.  Cardiovascular: Normal rate and intact distal pulses.  Pulmonary/Chest: Effort normal.  Musculoskeletal:  R shoulder without obvious deformity. TTP over anterior aspect of shoulder joint. No pain with passive ROM, pain with resistive external rotation and with active abduction. 5/5 grip strength BUE. Superficial abrasions to right elbow and wrist. Ecchymosis noted (pt on anticoagulation). No pain or tenderness of wrist of elbow.  Psychiatric: He has a normal mood and affect. His behavior is normal.  Nursing note and vitals reviewed.    ED Treatments / Results  Labs (all labs ordered are listed, but only abnormal results are displayed) Labs Reviewed - No data to display  EKG  EKG Interpretation None       Radiology Dg Shoulder Right  Result Date: 04/18/2017 CLINICAL DATA:  Pain following fall EXAM: RIGHT SHOULDER - 2+ VIEW COMPARISON:  None. FINDINGS: Frontal, Y scapular, and axillary images were obtained. There is moderate generalized osteoarthritic change. No fracture or dislocation. No erosive change. Visualized right lung is clear. IMPRESSION: Moderate  osteoarthritic change.  No fracture or dislocation. Electronically Signed   By: Bretta BangWilliam  Woodruff [name] M.D.   On: 04/18/2017 15:29    Procedures Procedures (including critical care time)  Medications Ordered in ED Medications  acetaminophen (TYLENOL) tablet 650 mg (650 mg Oral Given 04/18/17 1914)     Initial Impression / Assessment and Plan / ED Course  I have reviewed the triage vital signs and the nursing notes.  Pertinent labs & imaging results that were available during my care of the patient were reviewed by me and considered in my medical decision making (see chart for details).     Patient w R shoulder injury s/p mechanical fall, possible right rotator cuff injury. No head atrauma or LOC. X-Ray negative for obvious fracture or dislocation. Pain managed in ED. Pt advised to follow up with orthopedics in 1 week. Arm placed in sling. Conservative therapy recommended and discussed. Patient will be dc home & is agreeable with above plan.  Patient discussed with Dr. Madilyn Hookees.  Discussed results, findings, treatment and follow up. Patient advised of return precautions. Patient verbalized understanding and agreed with plan.  Final Clinical Impressions(s) / ED Diagnoses   Final diagnoses:  Acute pain of right shoulder    ED Discharge Orders    None       Robinson, [place] N, PA-C 04/18/17 2008    Tilden Fossaees, Elizabeth, MD 04/19/17 1456

## 2017-04-18 NOTE — ED Notes (Signed)
Offered pt ice pack, but he declined.

## 2017-04-18 NOTE — ED Notes (Signed)
Pt updated

## 2017-04-18 NOTE — Discharge Instructions (Signed)
Please read instructions below. Apply ice to your shoulder for 20 minutes at a time. You can take tylenol every 4 hours as needed for pain. Schedule an appointment with your orthopedic specialist in 1 for  follow-up on your injury. Return to the ER for new or concerning symptoms.

## 2017-04-24 ENCOUNTER — Telehealth: Payer: Self-pay | Admitting: Internal Medicine

## 2017-04-26 NOTE — Telephone Encounter (Signed)
Received refill for trazodone 50mg . Received BEERS flag between citalopram and trazodone regarding QT prolongation. Please advise.

## 2017-04-26 NOTE — Telephone Encounter (Signed)
Trazodone refill Last OV: 01/15/17 Last Refill:05/22/16 #30 5 RF  [facility] [address]

## 2017-04-26 NOTE — Telephone Encounter (Signed)
Copied from CRM [id number]. Topic: General - Other >> Apr 26, 2017  3:40 PM Raquel SarnaHayes, Gene G wrote: Trazodone 50 mg  2 pills left   Pt is handicapped and need to get this filled before the rain comes.  He doesn't want to drive at night.  [facility] [address] - [place] - [address] [place] [id number] Phone: [phone] Fax: [phone]

## 2017-04-27 NOTE — Telephone Encounter (Signed)
Trazodone and citalopram interact, although he has taken them together for a while we can try something different: Advised patient: Stop trazodone Go back on Ambien 5 mg 1 nightly as needed (he used to take them) #30 and 1 refill.

## 2017-05-17 ENCOUNTER — Telehealth: Payer: Self-pay | Admitting: Internal Medicine

## 2017-05-17 NOTE — Telephone Encounter (Signed)
Pt is requesting refill on alprazolam.   Last OV: 01/15/2017 Last Fill: 01/15/2017 #90 and 3RF (To early??) (Pt sig: 1 tab tid prn) UDS: 01/15/2017 Low risk  [place]CR printed- no issues noted.  Please advise.

## 2017-05-17 NOTE — Telephone Encounter (Signed)
Is ok , RF  Sent

## 2017-05-21 ENCOUNTER — Encounter: Payer: Self-pay | Admitting: Internal Medicine

## 2017-05-21 ENCOUNTER — Ambulatory Visit (INDEPENDENT_AMBULATORY_CARE_PROVIDER_SITE_OTHER): Payer: Medicare Other | Admitting: Internal Medicine

## 2017-05-21 VITALS — BP 138/84 | HR 43 | Temp 98.2°F | Resp 14 | Ht 69.0 in | Wt 189.4 lb

## 2017-05-21 DIAGNOSIS — E785 Hyperlipidemia, unspecified: Secondary | ICD-10-CM | POA: Diagnosis not present

## 2017-05-21 DIAGNOSIS — Z0000 Encounter for general adult medical examination without abnormal findings: Secondary | ICD-10-CM

## 2017-05-21 DIAGNOSIS — E119 Type 2 diabetes mellitus without complications: Secondary | ICD-10-CM

## 2017-05-21 DIAGNOSIS — Z0100 Encounter for examination of eyes and vision without abnormal findings: Secondary | ICD-10-CM

## 2017-05-21 LAB — PSA: PSA: 1.22 ng/mL (ref 0.10–4.00)

## 2017-05-21 LAB — LIPID PANEL
CHOLESTEROL: 146 mg/dL (ref 0–200)
HDL: 59.7 mg/dL (ref >39.00)
LDL Cholesterol: 70 mg/dL (ref 0–99)
NonHDL: 86.23
TRIGLYCERIDES: 80 mg/dL (ref 0.0–149.0)
Total CHOL/HDL Ratio: 2
VLDL: 16 mg/dL (ref 0.0–40.0)

## 2017-05-21 LAB — BASIC METABOLIC PANEL
BUN: 21 mg/dL (ref 6–23)
CHLORIDE: 109 meq/L (ref 96–112)
CO2: 28 mEq/L (ref 19–32)
Calcium: 9.6 mg/dL (ref 8.4–10.5)
Creatinine, Ser: 1.08 mg/dL (ref 0.40–1.50)
GFR: 70.71 mL/min (ref >60.00)
Glucose, Bld: 89 mg/dL (ref 70–99)
POTASSIUM: 4.4 meq/L (ref 3.5–5.1)
SODIUM: 141 meq/L (ref 135–145)

## 2017-05-21 LAB — HEMOGLOBIN A1C: HEMOGLOBIN A1C: 5.8 % (ref 4.6–6.5)

## 2017-05-21 LAB — TSH: TSH: 1.72 u[IU]/mL (ref 0.35–4.50)

## 2017-05-21 NOTE — Assessment & Plan Note (Addendum)
---  Td 08, declined a booster again;  pneumonia shot 2011; prevnar 2016;  zostavax ~2012; shingrix not available ---several Cscopes, see PMH, last  05-2016, next per GI ---DRE and PSA wnl 2016  ; [facility], had a DRE and PSA (?), they increased flomax to 2 tabs qd.  Check a PSA per patient request ---labs: CMP, A1c, TSH, PSA. - diet and exercise discussed

## 2017-05-21 NOTE — Progress Notes (Signed)
Subjective:    Patient ID: Gene [name], male    DOB: 06-Dec-1940, 77 y.o.   MRN: [id number]  DOS:  05/21/2017 Type of visit - description : cpx Interval history: In general feeling well. We discussed his chronic medical issues.  Review of Systems Having pain at the right shoulder and elbow, follow-up at [facility] orthopedic. Diet is healthy, improved, has lost some weight  Other than above, a 14 point review of systems is negative    Past Medical History:  Diagnosis Date  . Anemia    "just in 2016 when I was bleeding internally" (08/28/2015)  . Anxiety   . Aortic stenosis   . Arthritis   . Cervical spondylolysis   . Depression   . Diabetic ulcer of left foot (HCC) 08/28/2015   pt states he does not have diabetes  . Family history of anesthesia complication    "father would start seeing things"  . Heart murmur    "related to the aortic stenosis" (08/28/2015)  . History of alcohol abuse   . History of blood transfusion    "just in 2016 when I was bleeding internally" (08/28/2015)  . Hyperlipidemia    high TG  . Hypertension   . Lesion of ulnar nerve 07/22/2012   Bilateral ulnar neuropathies  . Neuropathy   . 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   left  . PVD (peripheral vascular disease) (HCC)    PVD, mild saw Dr Allyson Sabal 2012, intolerant to pletal, Rx observation  . Ulcer of esophagus 2016   "caused by positioning related to foot wound"    Past Surgical History:  Procedure Laterality Date  . AMPUTATION Left 11/20/13 and 11/24/13   middle  . AMPUTATION Left 01/03/2014   Procedure: Left Transmetatarsal Amputation;  Surgeon: Nadara Mustard, MD;  Location: [facility] OR;  Service: Orthopedics;  Laterality: Left;  . AMPUTATION Right 07/27/2014   Procedure: Right 4th Ray Amputation;  Surgeon: Nadara Mustard, MD;  Location: [facility] OR;  Service: Orthopedics;  Laterality: Right;  . AMPUTATION Right 09/12/2014   Procedure: RIGHT TRANSMETATARSAL AMPUTATION;  Surgeon: Nadara Mustard, MD;  Location: [facility] OR;  Service: Orthopedics;  Laterality: Right;  . AMPUTATION Right 09/28/2014   Procedure: Right Below Knee Amputation;  Surgeon: Nadara Mustard, MD;  Location: [facility] OR;  Service: Orthopedics;  Laterality: Right;  . CATARACT EXTRACTION W/ INTRAOCULAR LENS  IMPLANT, BILATERAL Bilateral 05/2015  . ESOPHAGOGASTRODUODENOSCOPY N/A 08/16/2014   Procedure: ESOPHAGOGASTRODUODENOSCOPY (EGD);  Surgeon: Vida Rigger, MD;  Location[facility] ENDOSCOPY;  Service: Endoscopy;  Laterality: N/A;  . ESOPHAGOGASTRODUODENOSCOPY (EGD) WITH PROPOFOL N/A 08/17/2014   Procedure: ESOPHAGOGASTRODUODENOSCOPY (EGD) WITH PROPOFOL;  Surgeon: Graylin Shiver, MD;  Location: [facility] ENDOSCOPY;  Service: Endoscopy;  Laterality: N/A;  . LAPAROSCOPIC CHOLECYSTECTOMY    . TONSILLECTOMY    . TRANSMETATARSAL AMPUTATION Left 10/2013    Social History   Socioeconomic History  . Marital status: Divorced    Spouse name: Not on file  . Number of children: 3  . Years of education: College  . Highest education level: Not on file  Social Needs  . Financial resource strain: Not on file  . Food insecurity - worry: Not on file  . Food insecurity - inability: Not on file  . Transportation needs - medical: Not on file  . Transportation needs - non-medical: Not on file  Occupational History  . Occupation: retired  Tobacco Use  . Smoking status: Never Smoker  . Smokeless tobacco:  Never Used  Substance and Sexual Activity  . Alcohol use: Yes    Alcohol/week: 6.0 oz    Types: 10 Cans of beer per week    Comment: 1 -2 beers qd  . Drug use: No  . Sexual activity: No  Other Topics Concern  . Not on file  Social History Narrative    Divorced since 1994; lives by himself, 3 sons, 1 GD  (all live close by [place], , [place])   [place] veteran       Family History  Problem Relation Age of Onset  . Hyperlipidemia Mother        alive, 88 y/o  . Lung cancer Father        asbestos releated  . Colon polyps Father   . Diabetes  Neg Hx   . Coronary artery disease Neg Hx   . Prostate cancer Neg Hx      Allergies as of 05/21/2017   No Known Allergies     Medication List        Accurate as of 05/21/17 11:59 PM. Always use your most recent med list.          acetaminophen 325 MG tablet Commonly known as:  TYLENOL Take 650 mg by mouth every 6 (six) hours as needed (pain). Reported on 08/27/2015   ALPRAZolam 0.5 MG tablet Commonly known as:  XANAX take 1 tablet by mouth three times a day if needed   amLODipine 5 MG tablet Commonly known as:  NORVASC Take 1 tablet (5 mg total) daily by mouth.   atorvastatin 40 MG tablet Commonly known as:  LIPITOR Take 1 tablet (40 mg total) at bedtime by mouth.   citalopram 20 MG tablet Commonly known as:  CELEXA Take 2 tablets (40 mg total) daily by mouth.   clopidogrel 75 MG tablet Commonly known as:  PLAVIX Take 75 mg by mouth daily.   fenofibrate 160 MG tablet Take 1 tablet (160 mg total) daily by mouth.   losartan 100 MG tablet Commonly known as:  COZAAR Take 1 tablet (100 mg total) daily by mouth.   METAMUCIL PO Take 2 capsules by mouth daily.   pregabalin 150 MG capsule Commonly known as:  LYRICA Take 150 mg by mouth 2 (two) times daily.   RA ASPIRIN EC 81 MG EC tablet Generic drug:  aspirin Take 81 mg by mouth daily.   SYSTANE BALANCE OP Apply 1 drop to eye 2 (two) times daily as needed (dry eyes). Reported on 08/27/2015   tamsulosin 0.4 MG Caps capsule Commonly known as:  FLOMAX Take 2 capsules (0.8 mg total) by mouth daily.   traZODone 50 MG tablet Commonly known as:  DESYREL Take 1 tablet (50 mg total) by mouth at bedtime.   VITAMIN C PO Take 3,000 mg daily by mouth.          Objective:   Physical Exam BP 138/84 (BP Location: Left Arm, Patient Position: Sitting, Cuff Size: Normal)   Pulse (!) 43   Temp 98.2 F (36.8 C) (Oral)   Resp 14   Ht 5\' 9"  (1.753 m)   Wt 189 lb 6 oz (85.9 kg)   SpO2 98%   BMI 27.97 kg/m    General:   Well developed, well nourished . NAD.  Neck: No  thyromegaly  HEENT:  Normocephalic . Face symmetric, atraumatic Lungs:  CTA B Normal respiratory effort, no intercostal retractions, no accessory muscle use. Heart: RRR, + systolic murmur Abdomen:  Not distended,  soft, non-tender. No rebound or rigidity.   Skin: Exposed areas without rash. Not pale. Not jaundice Neurologic:  alert & oriented X3.  Speech normal, gait with some difficulty due to MSK issues Strength symmetric and appropriate for age.  Psych: Cognition and judgment appear intact.  Cooperative with normal attention span and concentration.  Behavior appropriate. No anxious or depressed appearing.     Assessment & Plan:   Assessement  Hyperglycemia,  (A1c 5.8  2013) HTN Hyperlipidemia Neuropathy , peripheral:  d/t ETOH (per neuro note, Dr Anne HahnWillis,  2014) NCS 05-2011: Moderate neuropathy, low-grade right L5 radiculopathy Bilateral ulnar neuropathy Anxiety, depression , insomnia onset 1995 after a divorce CV: --Aortic stenosis: Mild per echocardiogram 12-2014, has a soft murmur --PVD  -Used to see  Dr. Allyson SabalBerry . Pletal intolerant, ABIs normal but vessels are noncompressible.  -Now sees Dr Smith [facility]ta[facility] s/p angioplasty L dorsalis pedis ~ 09-2015 ---Carotid Artery  [facility] --- [facility] MSK: --Osteomyelitis --Multiple toes amputations 2014 --Left transmetatarsal amputation 01/03/2014 (osteomyelitis) --Right transmetatarsal amputation 09/12/2014 -- R BKA  09/28/2014 d/  Dehiscence midfoot amputation -- DJD cervical spondylosis  GI bleed, admited 08-2014 >>> EGD  esophageal ulcer (d/t nsaids and doxycycline ?) H/o Alcohol abuse [facility]   PLAN Prediabetes: Diet controlled, check a A1c. HTN: Seems controlled on amlodipine, losartan.  Check a BMP Hyperlipidemia: Controlled on Lipitor and fenofibrates, request a FLP, will do MSK: Having  issues with R rotation cuff pain and cubital tunnel syndrome.  Seen by orthopedic surgery. Anxiety, depression, insomnia: Well controlled on Xanax 3 times a day, trazodone and citalopram. RTC 6 months

## 2017-05-21 NOTE — Patient Instructions (Signed)
GO TO THE LAB : Get the blood work     GO TO THE FRONT DESK Schedule your next appointment for a checkup in 6 months   Check the  blood pressure 2 or 3 times a month  Be sure your blood pressure is between 110/65 and  135/85. If it is consistently higher or lower, let me know    

## 2017-05-21 NOTE — Progress Notes (Signed)
Pre visit review using our clinic review tool, if applicable. No additional management support is needed unless otherwise documented below in the visit note. 

## 2017-05-23 NOTE — Assessment & Plan Note (Signed)
Prediabetes: Diet controlled, check a A1c. HTN: Seems controlled on amlodipine, losartan.  Check a BMP Hyperlipidemia: Controlled on Lipitor and fenofibrates, request a FLP, will do MSK: Having issues with R rotation cuff pain and cubital tunnel syndrome.  Seen by orthopedic surgery. Anxiety, depression, insomnia: Well controlled on Xanax 3 times a day, trazodone and citalopram. RTC 6 months

## 2017-06-17 ENCOUNTER — Telehealth: Payer: Self-pay

## 2017-06-17 NOTE — Telephone Encounter (Signed)
PA initiated via Covermymeds; KEY: [id number]. Received real time PA approval.   [id number];NFAOZH:YQMVHQIOCaseId:[id number];Status:Approved;Review Type:Prior Auth;Coverage Start Date:05/18/2017;Coverage End Date:06/17/2018;

## 2017-06-17 NOTE — Telephone Encounter (Signed)
PA initiated via Covermymeds; KEY: [id number]. Received real time PA approval.   [id number];OZHYQM:VHQIONGECaseId:[id number];Status:Approved;Review Type:Qty;Coverage Start Date:05/18/2017;Coverage End Date:06/17/2018;

## 2017-07-30 ENCOUNTER — Other Ambulatory Visit: Payer: Self-pay | Admitting: Internal Medicine

## 2017-08-11 ENCOUNTER — Telehealth: Payer: Self-pay | Admitting: *Deleted

## 2017-08-11 NOTE — Telephone Encounter (Signed)
Received [facility]; forwarded to provider/SLS 06/05

## 2017-09-17 ENCOUNTER — Telehealth: Payer: Self-pay | Admitting: Internal Medicine

## 2017-09-17 NOTE — Telephone Encounter (Signed)
Pt is requesting refill on alprazolam.   Last OV: 05/21/2017 Last Fill: 05/17/2017 #90 and 3RF (Pt sig: 1 tab tid prn) UDS: 01/15/2017 Low risk  NCCR in media- 05/17/2017- no discrepancies noted  Please advise.

## 2017-09-17 NOTE — Telephone Encounter (Signed)
Sent!

## 2017-09-21 ENCOUNTER — Other Ambulatory Visit: Payer: Self-pay | Admitting: Internal Medicine

## 2017-11-01 ENCOUNTER — Other Ambulatory Visit: Payer: Self-pay

## 2017-11-01 MED ORDER — CITALOPRAM HYDROBROMIDE 20 MG PO TABS: 40.0000 mg | ORAL_TABLET | Freq: Every day | ORAL | 1 refills | Status: DC

## 2017-11-09 ENCOUNTER — Other Ambulatory Visit: Payer: Self-pay | Admitting: Internal Medicine

## 2017-11-17 ENCOUNTER — Encounter: Payer: Self-pay | Admitting: Internal Medicine

## 2017-11-18 ENCOUNTER — Other Ambulatory Visit: Payer: Self-pay | Admitting: Internal Medicine

## 2017-11-18 NOTE — Telephone Encounter (Signed)
Xanax RF sent

## 2017-11-24 ENCOUNTER — Encounter: Payer: Self-pay | Admitting: Internal Medicine

## 2017-11-24 ENCOUNTER — Ambulatory Visit: Payer: Medicare Other | Admitting: Internal Medicine

## 2017-11-24 VITALS — BP 136/78 | HR 63 | Temp 97.8°F | Resp 16 | Ht 69.0 in | Wt 197.1 lb

## 2017-11-24 DIAGNOSIS — F419 Anxiety disorder, unspecified: Secondary | ICD-10-CM | POA: Diagnosis not present

## 2017-11-24 DIAGNOSIS — I739 Peripheral vascular disease, unspecified: Secondary | ICD-10-CM

## 2017-11-24 DIAGNOSIS — Z79899 Other long term (current) drug therapy: Secondary | ICD-10-CM | POA: Diagnosis not present

## 2017-11-24 DIAGNOSIS — I10 Essential (primary) hypertension: Secondary | ICD-10-CM

## 2017-11-24 DIAGNOSIS — F329 Major depressive disorder, single episode, unspecified: Secondary | ICD-10-CM

## 2017-11-24 LAB — COMPREHENSIVE METABOLIC PANEL
ALT: 12 U/L (ref 0–53)
AST: 16 U/L (ref 0–37)
Albumin: 4.1 g/dL (ref 3.5–5.2)
Alkaline Phosphatase: 29 U/L — ABNORMAL LOW (ref 39–117)
BUN: 17 mg/dL (ref 6–23)
CHLORIDE: 110 meq/L (ref 96–112)
CO2: 27 meq/L (ref 19–32)
CREATININE: 1.06 mg/dL (ref 0.40–1.50)
Calcium: 9.1 mg/dL (ref 8.4–10.5)
GFR: 72.15 mL/min (ref >60.00)
GLUCOSE: 102 mg/dL — AB (ref 70–99)
Potassium: 4.1 mEq/L (ref 3.5–5.1)
SODIUM: 141 meq/L (ref 135–145)
Total Bilirubin: 0.6 mg/dL (ref 0.2–1.2)
Total Protein: 5.8 g/dL — ABNORMAL LOW (ref 6.0–8.3)

## 2017-11-24 NOTE — Progress Notes (Signed)
Pre visit review using our clinic review tool, if applicable. No additional management support is needed unless otherwise documented below in the visit note. 

## 2017-11-24 NOTE — Patient Instructions (Signed)
GO TO THE LAB : Get the blood work     GO TO THE FRONT DESK Schedule your next appointment for a  Physical exam in 6 months   

## 2017-11-24 NOTE — Progress Notes (Signed)
Subjective:    Patient ID: Gene [name], male    DOB: [birth year], 77 y.o.   MRN: [id number]  DOS:  11/24/2017 Type of visit - description : rov Interval history: In general feels well. Good compliance w/  medication. Saw cardiology, notes reviewed.   Review of Systems Reports a lot of stress related to his son.   Good compliance with medications,  Feels he is handling the situation and stress related to his son well.  Past Medical History:  Diagnosis Date  . Anemia    "just in 2016 when I was bleeding internally" (08/28/2015)  . Anxiety   . Aortic stenosis   . Arthritis   . Cervical spondylolysis   . Depression   . Diabetic ulcer of left foot (HCC) 08/28/2015   pt states he does not have diabetes  . Family history of anesthesia complication    "father would start seeing things"  . Heart murmur    "related to the aortic stenosis" (08/28/2015)  . History of alcohol abuse   . History of blood transfusion    "just in 2016 when I was bleeding internally" (08/28/2015)  . Hyperlipidemia    high TG  . Hypertension   . Lesion of ulnar nerve 07/22/2012   Bilateral ulnar neuropathies  . Neuropathy   . 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   left  . PVD (peripheral vascular disease) (HCC)    PVD, mild saw Dr Allyson SabalBerry 2012, intolerant to pletal, Rx observation  . Ulcer of esophagus 2016   "caused by positioning related to foot wound"    Past Surgical History:  Procedure Laterality Date  . AMPUTATION Left 11/20/13 and 11/24/13   middle  . AMPUTATION Left 01/03/2014   Procedure: Left Transmetatarsal Amputation;  Surgeon: Nadara MustardMarcus Duda V, MD;  [facility];  Service: Orthopedics;  Laterality: Left;  . AMPUTATION Right 07/27/2014   Procedure: Right 4th Ray Amputation;  Surgeon: Nadara MustardMarcus Duda V, MD;  [facility];  Service: Orthopedics;  Laterality: Right;  . AMPUTATION Right 09/12/2014   Procedure: RIGHT TRANSMETATARSAL AMPUTATION;  Surgeon: Nadara MustardMarcus Duda V, MD;  Location:  [facility] OR;  Service: Orthopedics;  Laterality: Right;  . AMPUTATION Right 09/28/2014   Procedure: Right Below Knee Amputation;  Surgeon: Nadara MustardMarcus Duda V, MD;  [facility];  Service: Orthopedics;  Laterality: Right;  . CATARACT EXTRACTION W/ INTRAOCULAR LENS  IMPLANT, BILATERAL Bilateral 05/2015  . ESOPHAGOGASTRODUODENOSCOPY N/A 08/16/2014   Procedure: ESOPHAGOGASTRODUODENOSCOPY (EGD);  Surgeon: Vida RiggerMarc Magod, MD;  [facility];  Service: Endoscopy;  Laterality: N/A;  . ESOPHAGOGASTRODUODENOSCOPY (EGD) WITH PROPOFOL N/A 08/17/2014   Procedure: ESOPHAGOGASTRODUODENOSCOPY (EGD) WITH PROPOFOL;  Surgeon: Graylin ShiverSalem F Ganem, MD;  [facility];  Service: Endoscopy;  Laterality: N/A;  . LAPAROSCOPIC CHOLECYSTECTOMY    . TONSILLECTOMY    . TRANSMETATARSAL AMPUTATION Left 10/2013    Social History   Socioeconomic History  . Marital status: Divorced    Spouse name: Not on file  . Number of children: 3  . Years of education: College  . Highest education level: Not on file  Occupational History  . Occupation: retired  Engineer, productionocial Needs  . Financial resource strain: Not on file  . Food insecurity:    Worry: Not on file    Inability: Not on file  . Transportation needs:    Medical: Not on file    Non-medical: Not on file  Tobacco Use  . Smoking status: Never Smoker  . Smokeless tobacco: Never  Used  Substance and Sexual Activity  . Alcohol use: Yes    Alcohol/week: 10.0 standard drinks    Types: 10 Cans of beer per week    Comment: 1 -2 beers qd  . Drug use: No  . Sexual activity: Not Currently  Lifestyle  . Physical activity:    Days per week: Not on file    Minutes per session: Not on file  . Stress: Not on file  Relationships  . Social connections:    Talks on phone: Not on file    Gets together: Not on file    Attends religious service: Not on file    Active member of club or organization: Not on file    Attends meetings of clubs or organizations: Not on file    Relationship  status: Not on file  . Intimate partner violence:    Fear of current or ex partner: Not on file    Emotionally abused: Not on file    Physically abused: Not on file    Forced sexual activity: Not on file  Other Topics Concern  . Not on file  Social History Narrative    Divorced since 1994; lives by himself, 3 sons, 1 GD  (all live close by [place], [place], [place])   [place] veteran        Allergies as of 11/24/2017   No Known Allergies     Medication List        Accurate as of 11/24/17 11:59 PM. Always use your most recent med list.          acetaminophen 325 MG tablet Commonly known as:  TYLENOL Take 650 mg by mouth every 6 (six) hours as needed (pain). Reported on 08/27/2015   ALPRAZolam 0.5 MG tablet Commonly known as:  XANAX TAKE 1 TABLET BY MOUTH THREE TIMES DAILY AS NEEDED   amLODipine 5 MG tablet Commonly known as:  NORVASC Take 1 tablet (5 mg total) daily by mouth.   atorvastatin 40 MG tablet Commonly known as:  LIPITOR Take 1 tablet (40 mg total) at bedtime by mouth.   citalopram 20 MG tablet Commonly known as:  CELEXA Take 2 tablets (40 mg total) by mouth daily.   clopidogrel 75 MG tablet Commonly known as:  PLAVIX Take 75 mg by mouth daily.   fenofibrate 160 MG tablet Take 1 tablet (160 mg total) by mouth daily.   losartan 100 MG tablet Commonly known as:  COZAAR Take 1 tablet (100 mg total) daily by mouth.   METAMUCIL PO Take 5 capsules by mouth daily.   pregabalin 150 MG capsule Commonly known as:  LYRICA Take 150 mg by mouth 2 (two) times daily.   pyridOXINE 100 MG tablet Commonly known as:  VITAMIN B-6 Take 100 mg by mouth daily.   RA ASPIRIN EC 81 MG EC tablet Generic drug:  aspirin Take 81 mg by mouth daily.   SYSTANE BALANCE OP Apply 1 drop to eye 2 (two) times daily as needed (dry eyes). Reported on 08/27/2015   tamsulosin 0.4 MG Caps capsule Commonly known as:  FLOMAX Take 2 capsules (0.8 mg total) by mouth daily.     thiamine 50 MG tablet Take 50 mg by mouth daily.   traZODone 50 MG tablet Commonly known as:  DESYREL Take 1 tablet (50 mg total) by mouth at bedtime.   vitamin B-12 1000 MCG tablet Commonly known as:  CYANOCOBALAMIN Take 2,000 mcg by mouth daily.   VITAMIN C PO Take 9,000  mg by mouth daily.          Objective:   Physical Exam BP 136/78 (BP Location: Left Arm, Patient Position: Sitting, Cuff Size: Small)   Pulse 63   Temp 97.8 F (36.6 C) (Oral)   Resp 16   Ht 5\' 9"  (1.753 m)   Wt 197 lb 2 oz (89.4 kg)   SpO2 98%   BMI 29.11 kg/m   General:   Well developed, NAD, see BMI.  HEENT:  Normocephalic . Face symmetric, atraumatic Lungs:  CTA B Normal respiratory effort, no intercostal retractions, no accessory muscle use. Heart: RRR,  Mild sust murmur.  No pretibial edema bilaterally  Skin: Not pale. Not jaundice Neurologic:  alert & oriented X3.  Speech normal, gait appropriate for h/o amputations.  Unassisted. Psych--  Cognition and judgment appear intact.  Cooperative with normal attention span and concentration.  Behavior appropriate. No anxious or depressed appearing.      Assessment & Plan:   Assessement  Hyperglycemia,  (A1c 5.8  2013) HTN Hyperlipidemia Neuropathy , peripheral:  d/t ETOH (per neuro note, Dr Anne Hahn,  2014) NCS 05-2011: Moderate neuropathy, low-grade right L5 radiculopathy Bilateral ulnar neuropathy Anxiety, depression , insomnia onset 1995 after a divorce CV: --Aortic stenosis: Mild per echocardiogram 12-2014, has a soft murmur --PVD  -Used to see  Dr. Allyson Sabal . Pletal intolerant, ABIs normal but vessels are noncompressible.  -Now sees Dr Smith Robert [facility]), s/p angioplasty L dorsalis pedis ~ 09-2015 ---Carotid Artery  Dz: [place] [facility] 12-2015 --- [facility] test at [facility] 02/2016 (-) MSK: --Osteomyelitis --Multiple toes amputations 2014 --Left transmetatarsal amputation 01/03/2014 (osteomyelitis) --Right transmetatarsal amputation  09/12/2014 -- R BKA  09/28/2014 d/  Dehiscence midfoot amputation -- DJD cervical spondylosis  GI bleed, admited 08-2014 >>> EGD  esophageal ulcer (d/t nsaids and doxycycline ?) H/o Alcohol abuse Skin ca: R ear 10/2017 [facility]   PLAN HTN: Well-controlled on amlodipine, losartan.  Check a BMP. PAD, aortic stenosis : Saw cards 08/2017, rec to RTC  6 months , see below. To have a echo in few months . Excerpts from cards note: PAD  Rutherford class 0 disease. Patient's left transmetatarsal amputation site looks good. Results of his arterial duplex discussed with him today. Except his left posterior tibial artery, everything else is patent with good flow. Continue risk factor modification. No medication changes made today. Mild aortic stenosis He has been >1 year since his last echocardiogram. Symptoms of valvular disease progression discussed with him again today. Therefore, I will get an echocardiogram at the next clinic visit. Bilateral carotid artery stenosis Mild plaque. No significant stenosis on the last carotid duplex in 2017. Continue risk factor modification at this time Insomnia, anxiety,  depression.  Currently on Xanax, trazodone and citalopram.  UDS and contract today.  Is having a lot of stress, listening therapy provided.  Knows to call if symptoms increase. RTC 6 months CPX

## 2017-11-25 NOTE — Assessment & Plan Note (Signed)
PLAN HTN: Well-controlled on amlodipine, losartan.  Check a BMP. PAD, aortic stenosis : Saw cards 08/2017, rec to RTC  6 months , see below. To have a echo in few months . Excerpts from cards note: PAD  Rutherford class 0 disease. Patient's left transmetatarsal amputation site looks good. Results of his arterial duplex discussed with him today. Except his left posterior tibial artery, everything else is patent with good flow. Continue risk factor modification. No medication changes made today. Mild aortic stenosis He has been >1 year since his last echocardiogram. Symptoms of valvular disease progression discussed with him again today. Therefore, I will get an echocardiogram at the next clinic visit. Bilateral carotid artery stenosis Mild plaque. No significant stenosis on the last carotid duplex in 2017. Continue risk factor modification at this time Insomnia, anxiety,  depression.  Currently on Xanax, trazodone and citalopram.  UDS and contract today.  Is having a lot of stress, listening therapy provided.  Knows to call if symptoms increase. RTC 6 months CPX

## 2017-11-26 ENCOUNTER — Encounter: Payer: Self-pay | Admitting: Internal Medicine

## 2017-11-27 LAB — PAIN MGMT, PROFILE 8 W/CONF, U
6 Acetylmorphine: NEGATIVE ng/mL (ref <10)
ALPHAHYDROXYTRIAZOLAM: NEGATIVE ng/mL (ref <50)
AMPHETAMINES: NEGATIVE ng/mL (ref <500)
Alcohol Metabolites: POSITIVE ng/mL — AB (ref <500)
Alphahydroxyalprazolam: 692 ng/mL — ABNORMAL HIGH (ref <25)
Alphahydroxymidazolam: NEGATIVE ng/mL (ref <50)
Aminoclonazepam: NEGATIVE ng/mL (ref <25)
Benzodiazepines: POSITIVE ng/mL — AB (ref <100)
Buprenorphine, Urine: NEGATIVE ng/mL (ref <5)
CREATININE: 221.6 mg/dL
Cocaine Metabolite: NEGATIVE ng/mL (ref <150)
ETHYL GLUCURONIDE (ETG): 46045 ng/mL — AB (ref <500)
Ethyl Sulfate (ETS): 16476 ng/mL — ABNORMAL HIGH (ref <100)
HYDROXYETHYLFLURAZEPAM: NEGATIVE ng/mL (ref <50)
Lorazepam: NEGATIVE ng/mL (ref <50)
MDA: NEGATIVE ng/mL (ref <200)
MDMA: NEGATIVE ng/mL (ref <200)
MDMA: NEGATIVE ng/mL (ref <500)
Marijuana Metabolite: NEGATIVE ng/mL (ref <20)
Nordiazepam: NEGATIVE ng/mL (ref <50)
OXAZEPAM: NEGATIVE ng/mL (ref <50)
OXIDANT: NEGATIVE ug/mL (ref <200)
OXYCODONE: NEGATIVE ng/mL (ref <100)
Opiates: NEGATIVE ng/mL (ref <100)
PH: 5.28 (ref 4.5–9.0)
Temazepam: NEGATIVE ng/mL (ref <50)

## 2017-11-30 ENCOUNTER — Encounter: Payer: Self-pay | Admitting: Internal Medicine

## 2017-12-17 ENCOUNTER — Telehealth: Payer: Self-pay | Admitting: Internal Medicine

## 2017-12-17 NOTE — Telephone Encounter (Signed)
Pt is requesting refill on alprazolam.   Last OV: 11/24/2017 Last Fill: 11/18/2017 #90 and 0RF UDS: 11/24/2017 Low risk

## 2017-12-17 NOTE — Telephone Encounter (Signed)
Sent!

## 2018-01-16 ENCOUNTER — Telehealth: Payer: Self-pay | Admitting: Internal Medicine

## 2018-01-17 NOTE — Telephone Encounter (Signed)
done

## 2018-01-17 NOTE — Telephone Encounter (Signed)
Pt is requesting refill on alprazolam 0.5mg .   Last OV: 11/24/2017 Last Fill: 12/17/2017 #90 and 0RF (Pt sig 1 tab tid prn) UDS: 11/24/2017 Low risk  NCCR printed- no discrepancies noted- sent for scanning

## 2018-01-27 ENCOUNTER — Other Ambulatory Visit: Payer: Self-pay | Admitting: Internal Medicine

## 2018-03-20 ENCOUNTER — Telehealth: Payer: Self-pay | Admitting: Internal Medicine

## 2018-03-21 NOTE — Telephone Encounter (Signed)
Pt is requesting refill on alprazolam.   Last OV: 11/24/2017 Last Fill: 01/17/2018 #90 and 1RF Pt sig: 1 tab tid prn UDS: 11/24/2017 Low risk  NCCR in media from 01/17/2018

## 2018-03-21 NOTE — Telephone Encounter (Signed)
Sent!

## 2018-05-25 ENCOUNTER — Encounter: Payer: Medicare Other | Admitting: Internal Medicine

## 2018-06-02 ENCOUNTER — Other Ambulatory Visit: Payer: Self-pay | Admitting: Internal Medicine

## 2018-06-17 ENCOUNTER — Other Ambulatory Visit: Payer: Self-pay | Admitting: Internal Medicine

## 2018-06-20 ENCOUNTER — Telehealth: Payer: Self-pay

## 2018-06-20 NOTE — Telephone Encounter (Signed)
rx sent Needs a virtual OV

## 2018-06-20 NOTE — Telephone Encounter (Signed)
PA initiated via Covermymeds; KEY: [id number]. PA approved.  CaseId:[id number];Status:Approved;Review Type:Prior Auth;Coverage Start Date:05/21/2018;Coverage End Date:06/20/2019

## 2018-06-20 NOTE — Telephone Encounter (Signed)
Pt has appt 06/29/2018- will set up virtual visit for that day.

## 2018-06-20 NOTE — Telephone Encounter (Signed)
Pt is requesting refill on alprazolam.   Last OV: 11/24/2017 Last Fill: 03/21/2018 #90 and 2RF Pt sig: 1 tab tid prn UDS: 11/24/2017 Low risk

## 2018-06-24 ENCOUNTER — Telehealth: Payer: Self-pay | Admitting: Internal Medicine

## 2018-06-24 MED ORDER — CITALOPRAM HYDROBROMIDE 40 MG PO TABS: 40.0000 mg | ORAL_TABLET | Freq: Every day | ORAL | 1 refills | Status: DC

## 2018-06-24 NOTE — Telephone Encounter (Signed)
That is okay, send the patient a message stating that he will get citalopram 40 mg tablet, he needs to take just one of those tabs

## 2018-06-24 NOTE — Telephone Encounter (Signed)
Copied from CRM [id number]. Topic: Quick Communication - Rx Refill/Question >> Jun 24, 2018  1:50 PM Gene [name] wrote: Medication: citalopram (CELEXA) 20 MG tablet  Pt states he called [facility] and they had not had a refilled his rx even though he had refill on this Rx. It was not filled because his insurance will only pay for one pill a day now.  And no one at the pharmacy let the dr know about this problem. And his Rx is for 2 a day. Pt wants to know if dr Drue Novel can override this?  Pt is out of medication.  [id number] [place], [place] - [phone] AT [facility] [address] [phone] (Phone) [phone] (Fax)

## 2018-06-24 NOTE — Telephone Encounter (Signed)
Citalopram 40mg  sent. MyChart message sent to Pt.

## 2018-06-24 NOTE — Telephone Encounter (Signed)
Pt's insurance doesn't cover citalopram 20mg  2 tablets daily, okay to change to 40mg  tablets?

## 2018-06-29 ENCOUNTER — Encounter: Payer: Medicare Other | Admitting: Internal Medicine

## 2018-07-19 ENCOUNTER — Telehealth: Payer: Self-pay | Admitting: Internal Medicine

## 2018-07-19 ENCOUNTER — Telehealth: Payer: Self-pay

## 2018-07-19 NOTE — Telephone Encounter (Signed)
I don't think [employer] has approved virtual cpe's yet. Okay to schedule in person for Friday if he wants to come in AND if he has no symptoms.

## 2018-07-19 NOTE — Telephone Encounter (Signed)
Alprazolam refill.   Last OV: 11/24/2017, due for cpe but had to cancel because [facility] doesn't cover virtual cpe Last Fill; 06/20/2018 #90 and 0RF UDS: 11/24/2017 Low risk

## 2018-07-19 NOTE — Telephone Encounter (Signed)
Will RF 

## 2018-07-19 NOTE — Telephone Encounter (Signed)
Copied from CRM [phone]. Topic: Appointment Scheduling - Scheduling Inquiry for Clinic >> Jul 18, 2018  4:19 PM Gwenlyn Fudge A wrote: Reason for CRM: Pt called requesting to set up physical. Pt states he is interested in getting lab work done and possibly doing a virtual visit physical. Please advise.

## 2018-07-19 NOTE — Telephone Encounter (Signed)
LMOVM for pt to call back to set up CPE in office d/t type of insurance he has.

## 2018-07-26 ENCOUNTER — Other Ambulatory Visit: Payer: Self-pay | Admitting: Internal Medicine

## 2018-08-14 ENCOUNTER — Other Ambulatory Visit: Payer: Self-pay | Admitting: Internal Medicine

## 2018-08-18 ENCOUNTER — Telehealth: Payer: Self-pay | Admitting: Internal Medicine

## 2018-08-18 NOTE — Telephone Encounter (Signed)
Alprazolam.   Last OV: 11/24/2017, appt 08/23/2017 Last Fill: 07/19/2018 #90 and 0RF Pt sig: 1 tab tid prn UDS: 11/24/2017 Low risk

## 2018-08-18 NOTE — Telephone Encounter (Signed)
Have an appointment later this month, will send a refill

## 2018-08-24 ENCOUNTER — Ambulatory Visit (INDEPENDENT_AMBULATORY_CARE_PROVIDER_SITE_OTHER): Payer: Medicare Other | Admitting: Internal Medicine

## 2018-08-24 ENCOUNTER — Other Ambulatory Visit: Payer: Self-pay

## 2018-08-24 VITALS — BP 127/69 | HR 66 | Temp 97.9°F | Resp 16 | Ht 69.0 in | Wt 202.0 lb

## 2018-08-24 DIAGNOSIS — F419 Anxiety disorder, unspecified: Secondary | ICD-10-CM | POA: Diagnosis not present

## 2018-08-24 DIAGNOSIS — R739 Hyperglycemia, unspecified: Secondary | ICD-10-CM | POA: Diagnosis not present

## 2018-08-24 DIAGNOSIS — R399 Unspecified symptoms and signs involving the genitourinary system: Secondary | ICD-10-CM | POA: Diagnosis not present

## 2018-08-24 DIAGNOSIS — F329 Major depressive disorder, single episode, unspecified: Secondary | ICD-10-CM

## 2018-08-24 DIAGNOSIS — Z79899 Other long term (current) drug therapy: Secondary | ICD-10-CM | POA: Diagnosis not present

## 2018-08-24 DIAGNOSIS — E785 Hyperlipidemia, unspecified: Secondary | ICD-10-CM

## 2018-08-24 DIAGNOSIS — I10 Essential (primary) hypertension: Secondary | ICD-10-CM | POA: Diagnosis not present

## 2018-08-24 LAB — CBC WITH DIFFERENTIAL/PLATELET
Basophils Absolute: 0 10*3/uL (ref 0.0–0.1)
Basophils Relative: 0.5 % (ref 0.0–3.0)
Eosinophils Absolute: 0.1 10*3/uL (ref 0.0–0.7)
Eosinophils Relative: 1.8 % (ref 0.0–5.0)
HCT: 40.3 % (ref 39.0–52.0)
Hemoglobin: 13.4 g/dL (ref 13.0–17.0)
Lymphocytes Relative: 24.7 % (ref 12.0–46.0)
Lymphs Abs: 1.2 10*3/uL (ref 0.7–4.0)
MCHC: 33.2 g/dL (ref 30.0–36.0)
MCV: 94.2 fl (ref 78.0–100.0)
Monocytes Absolute: 0.6 10*3/uL (ref 0.1–1.0)
Monocytes Relative: 12 % (ref 3.0–12.0)
Neutro Abs: 2.9 10*3/uL (ref 1.4–7.7)
Neutrophils Relative %: 61 % (ref 43.0–77.0)
Platelets: 172 10*3/uL (ref 150.0–400.0)
RBC: 4.28 Mil/uL (ref 4.22–5.81)
RDW: 14.2 % (ref 11.5–15.5)
WBC: 4.7 10*3/uL (ref 4.0–10.5)

## 2018-08-24 LAB — LIPID PANEL
Cholesterol: 126 mg/dL (ref 0–200)
HDL: 47.7 mg/dL (ref >39.00)
LDL Cholesterol: 61 mg/dL (ref 0–99)
NonHDL: 78.67
Total CHOL/HDL Ratio: 3
Triglycerides: 86 mg/dL (ref 0.0–149.0)
VLDL: 17.2 mg/dL (ref 0.0–40.0)

## 2018-08-24 LAB — URINALYSIS, ROUTINE W REFLEX MICROSCOPIC
Bilirubin Urine: NEGATIVE
Hgb urine dipstick: NEGATIVE
Leukocytes,Ua: NEGATIVE
Nitrite: NEGATIVE
Specific Gravity, Urine: >=1.03 — AB (ref 1.000–1.030)
Total Protein, Urine: NEGATIVE
Urine Glucose: NEGATIVE
Urobilinogen, UA: 0.2 (ref 0.0–1.0)
pH: 5 (ref 5.0–8.0)

## 2018-08-24 LAB — COMPREHENSIVE METABOLIC PANEL
ALT: 25 U/L (ref 0–53)
AST: 23 U/L (ref 0–37)
Albumin: 4.2 g/dL (ref 3.5–5.2)
Alkaline Phosphatase: 30 U/L — ABNORMAL LOW (ref 39–117)
BUN: 20 mg/dL (ref 6–23)
CO2: 22 mEq/L (ref 19–32)
Calcium: 9 mg/dL (ref 8.4–10.5)
Chloride: 107 mEq/L (ref 96–112)
Creatinine, Ser: 0.97 mg/dL (ref 0.40–1.50)
GFR: 75.06 mL/min (ref >60.00)
Glucose, Bld: 78 mg/dL (ref 70–99)
Potassium: 4.1 mEq/L (ref 3.5–5.1)
Sodium: 139 mEq/L (ref 135–145)
Total Bilirubin: 0.6 mg/dL (ref 0.2–1.2)
Total Protein: 5.9 g/dL — ABNORMAL LOW (ref 6.0–8.3)

## 2018-08-24 LAB — HEMOGLOBIN A1C: Hgb A1c MFr Bld: 5.7 % (ref 4.6–6.5)

## 2018-08-24 LAB — PSA: PSA: 1.55 ng/mL (ref 0.10–4.00)

## 2018-08-24 NOTE — Progress Notes (Signed)
Subjective:    Patient ID: Gene [name], male    DOB: 1940/08/06, 78 y.o.   MRN: [id number]  DOS:  08/24/2018 Type of visit - description: Routine office visit Anxiety: Currently well controlled HTN: Ambulatory BPs when checked are normal, the last time was 116/70 L UTS: Has urinary frequency on and off, would like his PSA checked. He wonders if he is able to empty his bladder completely Hyperlipidemia: Due for a FLP   Review of Systems Following very good precautions for COVID-19. No fever chills No chest pain no difficulty breathing No nausea, vomiting, diarrhea No dysuria, gross hematuria.  Does have some urinary frequency.  Past Medical History:  Diagnosis Date  . Anemia    "just in 2016 when I was bleeding internally" (08/28/2015)  . Anxiety   . Aortic stenosis   . Arthritis   . Cervical spondylolysis   . Depression   . Diabetic ulcer of left foot (HCC) 08/28/2015   pt states he does not have diabetes  . Family history of anesthesia complication    "father would start seeing things"  . Heart murmur    "related to the aortic stenosis" (08/28/2015)  . History of alcohol abuse   . History of blood transfusion    "just in 2016 when I was bleeding internally" (08/28/2015)  . Hyperlipidemia    high TG  . Hypertension   . Lesion of ulnar nerve 07/22/2012   Bilateral ulnar neuropathies  . Neuropathy   . 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   left  . PVD (peripheral vascular disease) (HCC)    PVD, mild saw Dr Allyson SabalBerry 2012, intolerant to pletal, Rx observation  . Ulcer of esophagus 2016   "caused by positioning related to foot wound"    Past Surgical History:  Procedure Laterality Date  . AMPUTATION Left 11/20/13 and 11/24/13   middle  . AMPUTATION Left 01/03/2014   Procedure: Left Transmetatarsal Amputation;  Surgeon: Nadara MustardMarcus Duda V, MD;  [facility];  Service: Orthopedics;  Laterality: Left;  . AMPUTATION Right 07/27/2014   Procedure: Right 4th Ray  Amputation;  Surgeon: Nadara MustardMarcus Duda V, MD;  [facility];  Service: Orthopedics;  Laterality: Right;  . AMPUTATION Right 09/12/2014   Procedure: RIGHT TRANSMETATARSAL AMPUTATION;  Surgeon: Nadara MustardMarcus Duda V, MD;  Location: [facility] OR;  Service: Orthopedics;  Laterality: Right;  . AMPUTATION Right 09/28/2014   Procedure: Right Below Knee Amputation;  Surgeon: Nadara MustardMarcus Duda V, MD;  [facility];  Service: Orthopedics;  Laterality: Right;  . CATARACT EXTRACTION W/ INTRAOCULAR LENS  IMPLANT, BILATERAL Bilateral 05/2015  . ESOPHAGOGASTRODUODENOSCOPY N/A 08/16/2014   Procedure: ESOPHAGOGASTRODUODENOSCOPY (EGD);  Surgeon: Vida RiggerMarc Magod, MD;  [facility];  Service: Endoscopy;  Laterality: N/A;  . ESOPHAGOGASTRODUODENOSCOPY (EGD) WITH PROPOFOL N/A 08/17/2014   Procedure: ESOPHAGOGASTRODUODENOSCOPY (EGD) WITH PROPOFOL;  Surgeon: Graylin ShiverSalem F Ganem, MD;  [facility];  Service: Endoscopy;  Laterality: N/A;  . LAPAROSCOPIC CHOLECYSTECTOMY    . TONSILLECTOMY    . TRANSMETATARSAL AMPUTATION Left 10/2013    Social History   Socioeconomic History  . Marital status: Divorced    Spouse name: Not on file  . Number of children: 3  . Years of education: College  . Highest education level: Not on file  Occupational History  . Occupation: retired  Engineer, productionocial Needs  . Financial resource strain: Not on file  . Food insecurity    Worry: Not on file    Inability: Not on file  .  Transportation needs    Medical: Not on file    Non-medical: Not on file  Tobacco Use  . Smoking status: Never Smoker  . Smokeless tobacco: Never Used  Substance and Sexual Activity  . Alcohol use: Yes    Alcohol/week: 10.0 standard drinks    Types: 10 Cans of beer per week    Comment: 1 -2 beers qd  . Drug use: No  . Sexual activity: Not Currently  Lifestyle  . Physical activity    Days per week: Not on file    Minutes per session: Not on file  . Stress: Not on file  Relationships  . Social Musicianconnections    Talks on phone: Not  on file    Gets together: Not on file    Attends religious service: Not on file    Active member of club or organization: Not on file    Attends meetings of clubs or organizations: Not on file    Relationship status: Not on file  . Intimate partner violence    Fear of current or ex partner: Not on file    Emotionally abused: Not on file    Physically abused: Not on file    Forced sexual activity: Not on file  Other Topics Concern  . Not on file  Social History Narrative    Divorced since 1994; lives by himself, 3 sons, 1 GD  ([place]   [place]        Allergies as of 08/24/2018   No Known Allergies     Medication List       Accurate as of August 24, 2018 11:59 PM. If you have any questions, ask your nurse or doctor.        STOP taking these medications   VITAMIN C PO Stopped by: Willow OraJose Dontarious Schaum, MD     TAKE these medications   acetaminophen 325 MG tablet Commonly known as: TYLENOL Take 650 mg by mouth every 6 (six) hours as needed (pain). Reported on 08/27/2015   ALPRAZolam 0.5 MG tablet Commonly known as: XANAX TAKE 1 TABLET(0.5 MG) BY MOUTH THREE TIMES DAILY AS NEEDED   amLODipine 5 MG tablet Commonly known as: NORVASC Take 1 tablet (5 mg total) daily by mouth.   atorvastatin 40 MG tablet Commonly known as: LIPITOR Take 1 tablet (40 mg total) at bedtime by mouth.   citalopram 40 MG tablet Commonly known as: CELEXA Take 1 tablet (40 mg total) by mouth daily. What changed: how much to take   clopidogrel 75 MG tablet Commonly known as: PLAVIX Take 75 mg by mouth daily.   fenofibrate 160 MG tablet Take 1 tablet (160 mg total) by mouth daily.   losartan 100 MG tablet Commonly known as: COZAAR Take 1 tablet (100 mg total) by mouth daily.   METAMUCIL PO Take 5 capsules by mouth daily.   pregabalin 150 MG capsule Commonly known as: LYRICA Take 150 mg by mouth 2 (two) times daily.   pyridOXINE 100 MG tablet Commonly  known as: VITAMIN B-6 Take 100 mg by mouth daily.   RA Aspirin EC 81 MG EC tablet Generic drug: aspirin Take 81 mg by mouth daily.   SYSTANE BALANCE OP Apply 1 drop to eye 2 (two) times daily as needed (dry eyes). Reported on 08/27/2015   tamsulosin 0.4 MG Caps capsule Commonly known as: FLOMAX Take 2 capsules (0.8 mg total) by mouth daily.   thiamine 50 MG tablet Take 50  mg by mouth daily.   traZODone 50 MG tablet Commonly known as: DESYREL Take 1 tablet (50 mg total) by mouth at bedtime.   vitamin B-12 1000 MCG tablet Commonly known as: CYANOCOBALAMIN Take 1,000 mcg by mouth daily.           Objective:   Physical Exam BP 127/69 (BP Location: Left Arm, Patient Position: Sitting, Cuff Size: Small)   Pulse 66   Temp 97.9 F (36.6 C) (Oral)   Resp 16   Ht 5\' 9"  (1.753 m)   Wt 202 lb (91.6 kg)   SpO2 97%   BMI 29.83 kg/m  General:   Well developed, NAD, BMI noted. HEENT:  Normocephalic . Face symmetric, atraumatic Lungs:  CTA B Normal respiratory effort, no intercostal retractions, no accessory muscle use. Heart: RRR,?  Soft systolic murmur.  Trace pretibial edema on the left Skin: Not pale. Not jaundice DRE: Normal sphincter tone, brown stools, prostate normal size, not tender or nodular Neurologic:  alert & oriented X3.  Speech normal, gait appropriate for history of amputation. Psych--  Cognition and judgment appear intact.  Cooperative with normal attention span and concentration.  Behavior appropriate. No anxious or depressed appearing.      Assessment     Assessement  Hyperglycemia,  (A1c 5.8  2013) HTN Hyperlipidemia Neuropathy , peripheral:  d/t ETOH (per neuro note, Dr Jannifer Franklin,  2014) NCS 05-2011: Moderate neuropathy, low-grade right L5 radiculopathy Bilateral ulnar neuropathy Anxiety, depression , insomnia onset 1995 after a divorce CV: --Aortic stenosis: Mild per echocardiogram 12-2014, has a soft murmur --PVD  -Used to see  Dr. Gwenlyn Found .  Pletal intolerant, ABIs normal but vessels are noncompressible.  -Now sees Dr Janese Banks [facility]), s/p angioplasty L dorsalis pedis ~ 09-2015 ---Carotid Artery  Dz: [place] at [facility] 12-2015 --- [facility] 02/2016 (-) MSK: --Osteomyelitis --Multiple toes amputations 2014 --Left transmetatarsal amputation 01/03/2014 (osteomyelitis) --Right transmetatarsal amputation 09/12/2014 -- R BKA  09/28/2014 d/  Dehiscence midfoot amputation -- DJD cervical spondylosis  GI bleed, admited 08-2014 >>> EGD  esophageal ulcer (d/t nsaids and doxycycline ?) H/o Alcohol abuse Skin ca: R ear 10/2017 Gets care at the [facility] and [place] as well   PLAN Anxiety, depression: Controlled.  On Xanax, UDS. Hyperglycemia: Diet controlled, check A1c HTN: Seems well controlled on amlodipine, losartan.  Check a CMP and CBC High cholesterol: On Lipitor, check a FLP. Neuropathy: Relatively well controlled. L UTS: Reports urinary frequency, request a PSA, will do.  DRE benign.  We will also get a UA urine culture.  Otherwise continue Flomax 2 tablets daily (typically prescribed by the [place]) CV: Saw cardiology 08/22/2018, for carotid artery stenosis they recommend RF modification For PAD, they are in the process to decide conservative vs invasive options RTC 4 months

## 2018-08-24 NOTE — Progress Notes (Signed)
Pre visit review using our clinic review tool, if applicable. No additional management support is needed unless otherwise documented below in the visit note. 

## 2018-08-24 NOTE — Patient Instructions (Addendum)
Get the blood work     [place] Schedule your next appointment for a physical in 4 months

## 2018-08-25 NOTE — Assessment & Plan Note (Addendum)
Anxiety, depression: Controlled.  On Xanax, UDS. Hyperglycemia: Diet controlled, check A1c HTN: Seems well controlled on amlodipine, losartan.  Check a CMP and CBC High cholesterol: On Lipitor, check a FLP. Neuropathy: Relatively well controlled. L UTS: Reports urinary frequency, request a PSA, will do.  DRE benign.  We will also get a UA urine culture.  Otherwise continue Flomax 2 tablets daily (typically prescribed by the [place]) CV: Saw cardiology 08/22/2018, for carotid artery stenosis they recommend RF modification For PAD, they are in the process to decide conservative vs invasive options RTC 4 months

## 2018-08-26 LAB — URINE CULTURE
MICRO NUMBER:: 579209
Result:: NO GROWTH
SPECIMEN QUALITY:: ADEQUATE

## 2018-08-27 LAB — PAIN MGMT, PROFILE 8 W/CONF, U
6 Acetylmorphine: NEGATIVE ng/mL
Alcohol Metabolites: POSITIVE ng/mL — AB (ref <500)
Alphahydroxyalprazolam: 451 ng/mL
Alphahydroxymidazolam: NEGATIVE ng/mL
Alphahydroxytriazolam: NEGATIVE ng/mL
Aminoclonazepam: NEGATIVE ng/mL
Amphetamines: NEGATIVE ng/mL
Benzodiazepines: POSITIVE ng/mL
Buprenorphine, Urine: NEGATIVE ng/mL
Cocaine Metabolite: NEGATIVE ng/mL
Creatinine: 257.4 mg/dL
Ethyl Glucuronide (ETG): 13727 ng/mL
Ethyl Sulfate (ETS): 3440 ng/mL
Hydroxyethylflurazepam: NEGATIVE ng/mL
Lorazepam: NEGATIVE ng/mL
MDA: NEGATIVE ng/mL
MDMA: NEGATIVE ng/mL
MDMA: NEGATIVE ng/mL
Marijuana Metabolite: NEGATIVE ng/mL
Nordiazepam: NEGATIVE ng/mL
Opiates: NEGATIVE ng/mL
Oxazepam: NEGATIVE ng/mL
Oxidant: NEGATIVE ug/mL
Oxycodone: NEGATIVE ng/mL
Temazepam: NEGATIVE ng/mL
pH: 4.8 (ref 4.5–9.0)

## 2018-08-30 ENCOUNTER — Telehealth: Payer: Self-pay

## 2018-08-30 NOTE — Telephone Encounter (Signed)
Spoke w/ Pt- informed results can take 3-4 days sometimes longer for test results to come back. Pt going to see his mother- unsure when he is going and has been recommended to have covid 19 test before he goes. He will call later for order for testing once plans have been made.

## 2018-08-30 NOTE — Telephone Encounter (Signed)
Copied from [place] [phone]. Topic: General - Other >> Aug 30, 2018  2:48 PM Marin Olp L wrote: Reason for CRM: Patient would like to know how long it will take to get COVID test results back if he did the test?

## 2018-09-15 ENCOUNTER — Telehealth: Payer: Self-pay | Admitting: Internal Medicine

## 2018-09-16 NOTE — Telephone Encounter (Signed)
Alprazolam refill   Last OV; 08/24/2018 Last Fill: 08/18/2018 #90 and 0RF Pt sig: 1 tab tid prn UDS: 11/24/2017 Low risk

## 2018-09-16 NOTE — Telephone Encounter (Signed)
Sent!

## 2018-10-26 ENCOUNTER — Telehealth: Payer: Self-pay

## 2018-10-26 NOTE — Telephone Encounter (Signed)
Copied from [place] #[id number]. Topic: General - Inquiry >> Oct 26, 2018  4:12 PM Percell Belt A wrote: Reason for CRM: pt called in and wanted Dr Larose Kells to know that he is going to see Dr at [facility] for his pancreas .  His appt is sept 23rd

## 2018-10-26 NOTE — Telephone Encounter (Signed)
Just an FYI

## 2018-10-27 NOTE — Telephone Encounter (Signed)
Please set up a virtual visit 

## 2018-10-27 NOTE — Telephone Encounter (Signed)
Noted  

## 2018-10-27 NOTE — Telephone Encounter (Signed)
Pt called back requesting to speak with PCP.

## 2018-10-28 NOTE — Telephone Encounter (Signed)
Called pt left msg to call back

## 2018-11-01 ENCOUNTER — Other Ambulatory Visit: Payer: Self-pay

## 2018-11-01 ENCOUNTER — Ambulatory Visit (INDEPENDENT_AMBULATORY_CARE_PROVIDER_SITE_OTHER): Payer: Medicare Other | Admitting: Internal Medicine

## 2018-11-01 DIAGNOSIS — E785 Hyperlipidemia, unspecified: Secondary | ICD-10-CM

## 2018-11-01 DIAGNOSIS — R1011 Right upper quadrant pain: Secondary | ICD-10-CM

## 2018-11-01 DIAGNOSIS — R1012 Left upper quadrant pain: Secondary | ICD-10-CM | POA: Diagnosis not present

## 2018-11-01 NOTE — Progress Notes (Signed)
Subjective:    Patient ID: Gene [name], male    DOB: 09-15-1940, 78 y.o.   MRN: [id number]  DOS:  11/01/2018 Type of visit - description: Attempted  to make this a video visit, due to technical difficulties from the patient side it was not possible  thus we proceeded with a Virtual Visit via Telephone    I connected with@   by telephone and verified that I am speaking with the correct person using two identifiers.  THIS ENCOUNTER IS A VIRTUAL VISIT DUE TO COVID-19 - PATIENT WAS NOT SEEN IN THE OFFICE. PATIENT HAS CONSENTED TO VIRTUAL VISIT / TELEMEDICINE VISIT   Location of patient: home  Location of provider: office  I discussed the limitations, risks, security and privacy concerns of performing an evaluation and management service by telephone and the availability of in person appointments. I also discussed with the patient that there may be a patient responsible charge related to this service. The patient expressed understanding and agreed to proceed.   History of Present Illness: Acute 2 weeks ago developed left upper abdominal pain, mild. Subsequently that pain resolved but now he has right, mid upper abdominal discomfort, not necessarily worse when he eats. He self diagnosed with pancreatitis and already got an appointment to see GI 11/08/2018.  He drinks typically 2 beers a day, " sometimes 4 during the weekends". In the last few days has stopped drinking, is eating healthier and is avoiding fatty foods. Mild pain persists.  Is not necessarily worse postprandial  Otherwise he feels well and is very pleased with his recent blood work    Review of Systems Denies fever chills No nausea, vomiting, diarrhea. No blood in the stools. No abdominal rash  Past Medical History:  Diagnosis Date   Anemia    "just in 2016 when I was bleeding internally" (08/28/2015)   Anxiety    Aortic stenosis    Arthritis    Cervical spondylolysis    Depression    Diabetic  ulcer of left foot (HCC) 08/28/2015   pt states he does not have diabetes   Family history of anesthesia complication    "father would start seeing things"   Heart murmur    "related to the aortic stenosis" (08/28/2015)   History of alcohol abuse    History of blood transfusion    "just in 2016 when I was bleeding internally" (08/28/2015)   Hyperlipidemia    high TG   Hypertension    Lesion of ulnar nerve 07/22/2012   Bilateral ulnar neuropathies   Neuropathy    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   left   PVD (peripheral vascular disease) (HCC)    PVD, mild saw Dr Allyson Sabal 2012, intolerant to pletal, Rx observation   Ulcer of esophagus 2016   "caused by positioning related to foot wound"    Past Surgical History:  Procedure Laterality Date   AMPUTATION Left 11/20/13 and 11/24/13   middle   AMPUTATION Left 01/03/2014   Procedure: Left Transmetatarsal Amputation;  Surgeon: Nadara Mustard, MD;  Location: [facility] OR;  Service: Orthopedics;  Laterality: Left;   AMPUTATION Right 07/27/2014   Procedure: Right 4th Ray Amputation;  Surgeon: Nadara Mustard, MD;  Location: [facility] OR;  Service: Orthopedics;  Laterality: Right;   AMPUTATION Right 09/12/2014   Procedure: RIGHT TRANSMETATARSAL AMPUTATION;  Surgeon: Nadara Mustard, MD;  Location: [facility] OR;  Service: Orthopedics;  Laterality: Right;   AMPUTATION Right 09/28/2014   Procedure:  Right Below Knee Amputation;  Surgeon: Nadara MustardMarcus Duda V, MD;  [facility];  Service: Orthopedics;  Laterality: Right;   CATARACT EXTRACTION W/ INTRAOCULAR LENS  IMPLANT, BILATERAL Bilateral 05/2015   ESOPHAGOGASTRODUODENOSCOPY N/A 08/16/2014   Procedure: ESOPHAGOGASTRODUODENOSCOPY (EGD);  Surgeon: Vida RiggerMarc Magod, MD;  [facility];  Service: Endoscopy;  Laterality: N/A;   ESOPHAGOGASTRODUODENOSCOPY (EGD) WITH PROPOFOL N/A 08/17/2014   Procedure: ESOPHAGOGASTRODUODENOSCOPY (EGD) WITH PROPOFOL;  Surgeon: Graylin ShiverSalem F Ganem, MD;  [facility];   Service: Endoscopy;  Laterality: N/A;   LAPAROSCOPIC CHOLECYSTECTOMY     TONSILLECTOMY     TRANSMETATARSAL AMPUTATION Left 10/2013    Social History   Socioeconomic History   Marital status: Divorced    Spouse name: Not on file   Number of children: 3   Years of education: College   Highest education level: Not on file  Occupational History   Occupation: retired  Ecologistocial Needs   Financial resource strain: Not on file   Food insecurity    Worry: Not on file    Inability: Not on Occupational hygienistfile   Transportation needs    Medical: Not on file    Non-medical: Not on file  Tobacco Use   Smoking status: Never Smoker   Smokeless tobacco: Never Used  Substance and Sexual Activity   Alcohol use: Yes    Alcohol/week: 10.0 standard drinks    Types: 10 Cans of beer per week    Comment: 1 -2 beers qd   Drug use: No   Sexual activity: Not Currently  Lifestyle   Physical activity    Days per week: Not on file    Minutes per session: Not on file   Stress: Not on file  Relationships   Social connections    Talks on phone: Not on file    Gets together: Not on file    Attends religious service: Not on file    Active member of club or organization: Not on file    Attends meetings of clubs or organizations: Not on file    Relationship status: Not on file   Intimate partner violence    Fear of current or ex partner: Not on file    Emotionally abused: Not on file    Physically abused: Not on file    Forced sexual activity: Not on file  Other Topics Concern   Not on file  Social History Narrative    Divorced since 1994; lives by himself, 3 sons, 1 GD  (all live [place]   [place]        Allergies as of 11/01/2018   No Known Allergies     Medication List       Accurate as of November 01, 2018  3:37 PM. If you have any questions, ask your nurse or doctor.        acetaminophen 325 MG tablet Commonly known as: TYLENOL Take 650 mg by  mouth every 6 (six) hours as needed (pain). Reported on 08/27/2015   ALPRAZolam 0.5 MG tablet Commonly known as: XANAX TAKE 1 TABLET(0.5 MG) BY MOUTH THREE TIMES DAILY AS NEEDED   amLODipine 5 MG tablet Commonly known as: NORVASC Take 1 tablet (5 mg total) daily by mouth.   atorvastatin 40 MG tablet Commonly known as: LIPITOR Take 1 tablet (40 mg total) at bedtime by mouth.   citalopram 40 MG tablet Commonly known as: CELEXA Take 1 tablet (40 mg total) by mouth daily. What changed: how much to take  clopidogrel 75 MG tablet Commonly known as: PLAVIX Take 75 mg by mouth daily.   fenofibrate 160 MG tablet Take 1 tablet (160 mg total) by mouth daily.   losartan 100 MG tablet Commonly known as: COZAAR Take 1 tablet (100 mg total) by mouth daily.   METAMUCIL PO Take 5 capsules by mouth daily.   pregabalin 150 MG capsule Commonly known as: LYRICA Take 150 mg by mouth 2 (two) times daily.   pyridOXINE 100 MG tablet Commonly known as: VITAMIN B-6 Take 100 mg by mouth daily.   RA Aspirin EC 81 MG EC tablet Generic drug: aspirin Take 81 mg by mouth daily.   SYSTANE BALANCE OP Apply 1 drop to eye 2 (two) times daily as needed (dry eyes). Reported on 08/27/2015   tamsulosin 0.4 MG Caps capsule Commonly known as: FLOMAX Take 2 capsules (0.8 mg total) by mouth daily.   thiamine 50 MG tablet Take 50 mg by mouth daily.   traZODone 50 MG tablet Commonly known as: DESYREL Take 1 tablet (50 mg total) by mouth at bedtime.   vitamin B-12 1000 MCG tablet Commonly known as: CYANOCOBALAMIN Take 1,000 mcg by mouth daily.           Objective:   Physical Exam There were no vitals taken for this visit. This is a virtual phone visit, he sounds alert oriented x3, in no distress    Assessment     Assessement  Hyperglycemia,  (A1c 5.8  2013) HTN Hyperlipidemia Neuropathy , peripheral:  d/t ETOH (per neuro note, Dr Jannifer Franklin,  2014) NCS 05-2011: Moderate neuropathy,  low-grade right L5 radiculopathy Bilateral ulnar neuropathy Anxiety, depression , insomnia onset 1995 after a divorce CV: --Aortic stenosis: Mild per echocardiogram 12-2014, has a soft murmur --PVD  -Used to see  Dr. Gwenlyn Found . Pletal intolerant, ABIs normal but vessels are noncompressible.  -Now sees Dr Janese Banks [facility]), s/p angioplasty L dorsalis pedis ~ 09-2015 ---Carotid Artery  Dz: [place] at [facility] 12-2015 --- Stress test at [facility] 02/2016 (-) MSK: --Osteomyelitis --Multiple toes amputations 2014 --Left transmetatarsal amputation 01/03/2014 (osteomyelitis) --Right transmetatarsal amputation 09/12/2014 -- R BKA  09/28/2014 d/  Dehiscence midfoot amputation -- DJD cervical spondylosis  GI bleed, admited 08-2014 >>> EGD  esophageal ulcer (d/t nsaids and doxycycline ?) H/o Alcohol abuse Skin ca: R ear 10/2017 [facility] and [place] as well   PLAN Upper abdominal discomfort, mild as described by the patient, in the context of daily and at times heavy alcohol use.  Currently with no red flags that indicate need for immediate evaluation. He already have an appointment to see GI 11/08/2018 as the patient suspect pancreatitis. That is certainly 1 of the many possibilities, recommend to see GI as scheduled let me know if symptoms change or get worse before that appointment. High cholesterol: Excellent results, continue atorvastatin and fenofibrate Preventive care: Recommend a flu shot RTC already scheduled for October    I discussed the assessment and treatment plan with the patient. The patient was provided an opportunity to ask questions and all were answered. The patient agreed with the plan and demonstrated an understanding of the instructions.   The patient was advised to call back or seek an in-person evaluation if the symptoms worsen or if the condition fails to improve as anticipated.  I provided 15 minutes of non-face-to-face time during this encounter.  Kathlene November, MD

## 2018-11-03 NOTE — Assessment & Plan Note (Signed)
Upper abdominal discomfort, mild as described by the patient, in the context of daily and at times heavy alcohol use.  Currently with no red flags that indicate need for immediate evaluation. He already have an appointment to see GI 11/08/2018 as the patient suspect pancreatitis. That is certainly 1 of the many possibilities, recommend to see GI as scheduled let me know if symptoms change or get worse before that appointment. High cholesterol: Excellent results, continue atorvastatin and fenofibrate Preventive care: Recommend a flu shot RTC already scheduled for October

## 2018-11-08 ENCOUNTER — Other Ambulatory Visit: Payer: Self-pay | Admitting: Gastroenterology

## 2018-11-08 DIAGNOSIS — R1013 Epigastric pain: Secondary | ICD-10-CM

## 2018-11-09 ENCOUNTER — Ambulatory Visit
Admission: RE | Admit: 2018-11-09 | Discharge: 2018-11-09 | Disposition: A | Discharge status: Home / Self Care | Payer: Medicare Other | Source: Ambulatory Visit | Attending: Gastroenterology | Admitting: Gastroenterology

## 2018-11-09 DIAGNOSIS — R1013 Epigastric pain: Secondary | ICD-10-CM

## 2018-11-30 ENCOUNTER — Other Ambulatory Visit: Payer: Self-pay

## 2018-11-30 NOTE — Telephone Encounter (Signed)
Alprazolam refill. Refill request to early however Pt did call and state he was going out of town on emergency unsure how long he will be gone?   Last OV: 11/01/2018 Last Fill: 09/16/2018 #90 and 3RF (Too early??) Pt sig: 1 tab tid prn UDS: 11/24/2017 Low risk

## 2018-11-30 NOTE — Telephone Encounter (Signed)
Copied from [place] [phone]. Topic: General - Other >> Nov 30, 2018 12:19 PM Carolyn Stare wrote: Pt is asking for a early refill he has to go out of town on an emergency, would like to pick up today   ALPRAZolam Duanne Moron) 0.5 MG tablet  [facility] [address]

## 2018-11-30 NOTE — Telephone Encounter (Signed)
Spoke w/ Pt- he is aware that he has fills available at the pharmacy however [facility] will not release w/o okay from Dr. Larose Kells first. Pt needing to leave to go to [facility] on family emergency either later tonight or tomorrow. I informed him I would call [facility]. Spoke w/ pharmacist at [facility]- okay to refill early today. Pt informed that [facility] is getting refill ready. Pt verbalized understanding.

## 2018-11-30 NOTE — Telephone Encounter (Signed)
Is too early. If needed, okay to advise pharmacy to do early refill since he is going out of town

## 2018-12-13 ENCOUNTER — Other Ambulatory Visit: Payer: Self-pay | Admitting: Internal Medicine

## 2018-12-13 NOTE — Telephone Encounter (Signed)
Please confirm the dosage on citalopram.

## 2018-12-14 NOTE — Telephone Encounter (Signed)
He is taking 40 mg daily.  Prescription sent

## 2018-12-28 ENCOUNTER — Encounter: Payer: Self-pay | Admitting: Internal Medicine

## 2018-12-28 ENCOUNTER — Ambulatory Visit: Payer: Medicare Other | Admitting: Internal Medicine

## 2018-12-28 ENCOUNTER — Telehealth: Payer: Self-pay

## 2018-12-28 ENCOUNTER — Other Ambulatory Visit: Payer: Self-pay

## 2018-12-28 VITALS — BP 108/53 | HR 69 | Temp 97.2°F | Resp 16 | Ht 69.0 in | Wt 190.0 lb

## 2018-12-28 DIAGNOSIS — R1010 Upper abdominal pain, unspecified: Secondary | ICD-10-CM

## 2018-12-28 DIAGNOSIS — F419 Anxiety disorder, unspecified: Secondary | ICD-10-CM | POA: Diagnosis not present

## 2018-12-28 DIAGNOSIS — I10 Essential (primary) hypertension: Secondary | ICD-10-CM

## 2018-12-28 DIAGNOSIS — Z23 Encounter for immunization: Secondary | ICD-10-CM | POA: Diagnosis not present

## 2018-12-28 DIAGNOSIS — E785 Hyperlipidemia, unspecified: Secondary | ICD-10-CM

## 2018-12-28 DIAGNOSIS — F329 Major depressive disorder, single episode, unspecified: Secondary | ICD-10-CM

## 2018-12-28 MED ORDER — ALPRAZOLAM 0.25 MG PO TABS: 0.5000 mg | ORAL_TABLET | Freq: Three times a day (TID) | ORAL | 0 refills | Status: DC | PRN

## 2018-12-28 MED ORDER — ALPRAZOLAM 0.5 MG PO TABS: 0.5000 mg | ORAL_TABLET | Freq: Three times a day (TID) | ORAL | 0 refills | Status: DC | PRN

## 2018-12-28 NOTE — Telephone Encounter (Signed)
Sent new prescription, see office visit from today

## 2018-12-28 NOTE — Progress Notes (Signed)
Pre visit review using our clinic review tool, if applicable. No additional management support is needed unless otherwise documented below in the visit note. 

## 2018-12-28 NOTE — Progress Notes (Signed)
Subjective:    Patient ID: Gene [name], male    DOB: 1940-10-21, 78 y.o.   MRN: [id number]  DOS:  12/28/2018 Type of visit - description: Routine follow-up Abdominal pain: Resolved Anxiety: Needs early refill on Xanax, is taking care of his mother who lives out of state and needs to go there on and off. EtOH: Reports is drinking less, some days nothing,  other days 2 to 3 beers. Weight loss noted, he thinks is related to drinking less alcohol. HTN: Good medication compliance, ambulatory BPs normal   Wt Readings from Last 3 Encounters:  12/28/18 190 lb (86.2 kg)  08/24/18 202 lb (91.6 kg)  11/24/17 197 lb 2 oz (89.4 kg)    Review of Systems Denies fever chills No chest pain no difficulty breathing No nausea, vomiting, diarrhea.  No blood in the stools. No cough  Past Medical History:  Diagnosis Date  . Anemia    "just in 2016 when I was bleeding internally" (08/28/2015)  . Anxiety   . Aortic stenosis   . Arthritis   . Cervical spondylolysis   . Depression   . Diabetic ulcer of left foot (HCC) 08/28/2015   pt states he does not have diabetes  . Family history of anesthesia complication    "father would start seeing things"  . Heart murmur    "related to the aortic stenosis" (08/28/2015)  . History of alcohol abuse   . History of blood transfusion    "just in 2016 when I was bleeding internally" (08/28/2015)  . Hyperlipidemia    high TG  . Hypertension   . Lesion of ulnar nerve 07/22/2012   Bilateral ulnar neuropathies  . Neuropathy   . 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   left  . PVD (peripheral vascular disease) (HCC)    PVD, mild saw Dr Allyson Sabal 2012, intolerant to pletal, Rx observation  . Ulcer of esophagus 2016   "caused by positioning related to foot wound"    Past Surgical History:  Procedure Laterality Date  . AMPUTATION Left 11/20/13 and 11/24/13   middle  . AMPUTATION Left 01/03/2014   Procedure: Left Transmetatarsal Amputation;   Surgeon: Nadara Mustard, MD;  Location[facility];  Service: Orthopedics;  Laterality: Left;  . AMPUTATION Right 07/27/2014   Procedure: Right 4th Ray Amputation;  Surgeon: Nadara Mustard, MD;  Location[facility] OR;  Service: Orthopedics;  Laterality: Right;  . AMPUTATION Right 09/12/2014   Procedure: RIGHT TRANSMETATARSAL AMPUTATION;  Surgeon: Nadara Mustard, MD;  Location: [facility] OR;  Service: Orthopedics;  Laterality: Right;  . AMPUTATION Right 09/28/2014   Procedure: Right Below Knee Amputation;  Surgeon: Nadara Mustard, MD;  Location[facility] OR;  Service: Orthopedics;  Laterality: Right;  . CATARACT EXTRACTION W/ INTRAOCULAR LENS  IMPLANT, BILATERAL Bilateral 05/2015  . ESOPHAGOGASTRODUODENOSCOPY N/A 08/16/2014   Procedure: ESOPHAGOGASTRODUODENOSCOPY (EGD);  Surgeon: Vida Rigger, MD;  Location: [facility] ENDOSCOPY;  Service: Endoscopy;  Laterality: N/A;  . ESOPHAGOGASTRODUODENOSCOPY (EGD) WITH PROPOFOL N/A 08/17/2014   Procedure: ESOPHAGOGASTRODUODENOSCOPY (EGD) WITH PROPOFOL;  Surgeon: Graylin Shiver, MD;  Location: [facility] ENDOSCOPY;  Service: Endoscopy;  Laterality: N/A;  . LAPAROSCOPIC CHOLECYSTECTOMY    . TONSILLECTOMY    . TRANSMETATARSAL AMPUTATION Left 10/2013    Social History   Socioeconomic History  . Marital status: Divorced    Spouse name: Not on file  . Number of children: 3  . Years of education: College  . Highest education level: Not on file  Occupational History  . Occupation: retired  Scientific laboratory technician  . Financial resource strain: Not on file  . Food insecurity    Worry: Not on file    Inability: Not on file  . Transportation needs    Medical: Not on file    Non-medical: Not on file  Tobacco Use  . Smoking status: Never Smoker  . Smokeless tobacco: Never Used  Substance and Sexual Activity  . Alcohol use: Yes    Alcohol/week: 10.0 standard drinks    Types: 10 Cans of beer per week    Comment: 1 -2 beers qd  . Drug use: No  . Sexual activity: Not Currently  Lifestyle  . Physical activity     Days per week: Not on file    Minutes per session: Not on file  . Stress: Not on file  Relationships  . Social Herbalist on phone: Not on file    Gets together: Not on file    Attends religious service: Not on file    Active member of club or organization: Not on file    Attends meetings of clubs or organizations: Not on file    Relationship status: Not on file  . Intimate partner violence    Fear of current or ex partner: Not on file    Emotionally abused: Not on file    Physically abused: Not on file    Forced sexual activity: Not on file  Other Topics Concern  . Not on file  Social History Narrative    Divorced since 1994; lives by himself, 3 sons, 1 GD  (all live close by [place], [place], [place])   [place] veteran        Allergies as of 12/28/2018   No Known Allergies     Medication List       Accurate as of December 28, 2018 11:59 PM. If you have any questions, ask your nurse or doctor.        acetaminophen 325 MG tablet Commonly known as: TYLENOL Take 650 mg by mouth every 6 (six) hours as needed (pain). Reported on 08/27/2015   ALPRAZolam 0.25 MG tablet Commonly known as: XANAX Take 2 tablets (0.5 mg total) by mouth 3 (three) times daily as needed for anxiety. What changed:   medication strength  See the new instructions. Changed by: Kathlene November, MD   amLODipine 5 MG tablet Commonly known as: NORVASC Take 1 tablet (5 mg total) daily by mouth.   atorvastatin 40 MG tablet Commonly known as: LIPITOR Take 1 tablet (40 mg total) at bedtime by mouth.   citalopram 40 MG tablet Commonly known as: CELEXA TAKE 1 TABLET(40 MG) BY MOUTH DAILY   clopidogrel 75 MG tablet Commonly known as: PLAVIX Take 75 mg by mouth daily.   fenofibrate 160 MG tablet Take 1 tablet (160 mg total) by mouth daily.   losartan 100 MG tablet Commonly known as: COZAAR Take 1 tablet (100 mg total) by mouth daily.   METAMUCIL PO Take 5 capsules by mouth daily.    pregabalin 150 MG capsule Commonly known as: LYRICA Take 150 mg by mouth 2 (two) times daily.   pyridOXINE 100 MG tablet Commonly known as: VITAMIN B-6 Take 100 mg by mouth daily.   RA Aspirin EC 81 MG EC tablet Generic drug: aspirin Take 81 mg by mouth daily.   SYSTANE BALANCE OP Apply 1 drop to eye 2 (two) times daily as needed (dry eyes). Reported on 08/27/2015  tamsulosin 0.4 MG Caps capsule Commonly known as: FLOMAX Take 2 capsules (0.8 mg total) by mouth daily.   thiamine 50 MG tablet Take 50 mg by mouth daily.   traZODone 50 MG tablet Commonly known as: DESYREL Take 1 tablet (50 mg total) by mouth at bedtime.   vitamin B-12 1000 MCG tablet Commonly known as: CYANOCOBALAMIN Take 2,000 mcg by mouth daily.           Objective:   Physical Exam BP (!) 108/53 (BP Location: Left Arm, Patient Position: Sitting, Cuff Size: Normal)   Pulse 69   Temp (!) 97.2 F (36.2 C) (Temporal)   Resp 16   Ht 5\' 9"  (1.753 m)   Wt 190 lb (86.2 kg) Comment: w/ prothesis  SpO2 96%   BMI 28.06 kg/m  General:   Well developed, NAD, BMI noted. HEENT:  Normocephalic . Face symmetric, atraumatic Lungs:  CTA B Normal respiratory effort, no intercostal retractions, no accessory muscle use. Heart: RRR, mild systolic murmur.  Trace left pretibial edema Skin: Not pale. Not jaundice Neurologic:  alert & oriented X3.  Speech normal, gait appropriate for history of amputations.  Not assisted. Psych--  Cognition and judgment appear intact.  Cooperative with normal attention span and concentration.  Behavior appropriate. No anxious or depressed appearing.      Assessment     Assessement  Hyperglycemia,  (A1c 5.8  2013) HTN Hyperlipidemia Neuropathy , peripheral:  d/t ETOH (per neuro note, Dr Anne HahnWillis,  2014) NCS 05-2011: Moderate neuropathy, low-grade right L5 radiculopathy Bilateral ulnar neuropathy Anxiety, depression , insomnia onset 1995 after a divorce CV: --Aortic  stenosis: Mild per echocardiogram 12-2014, has a soft murmur --PVD  -Used to see  Dr. Allyson SabalBerry . Pletal intolerant, ABIs normal but vessels are noncompressible.  -Now sees Dr Smith Robertao [facility] s/p angioplasty L dorsalis pedis ~ 09-2015 ---Carotid Artery  [facility] --- [facility] MSK: --Osteomyelitis --Multiple toes amputations 2014 --Left transmetatarsal amputation 01/03/2014 (osteomyelitis) --Right transmetatarsal amputation 09/12/2014 -- R BKA  09/28/2014 d/  Dehiscence midfoot amputation -- DJD cervical spondylosis  GI bleed, admited 08-2014 >>> EGD  esophageal ulcer (d/t nsaids and doxycycline ?) H/o Alcohol abuse Skin ca: 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  He was also recommended PPIs.  Symptoms resolved. HTN: BP today slightly in the low side, at home reportedly normal BPs, continue amlodipine/losartan EtOH: Reports moderation. Anxiety, depression, insomnia: Some stress related to his mother's health but is doing okay.  Request a early Xanax refill.  PDMP reviewed, looks okay.  RF sent. Preventive care: PNM 23 today RTC CPX 3 to 4 months

## 2018-12-28 NOTE — Telephone Encounter (Signed)
Received fax from [facility]- alprazolam 0.5mg on back order through November- requesting to change to 0.25mg or 1mg dose.  

## 2018-12-28 NOTE — Patient Instructions (Addendum)
Please schedule Medicare Wellness with Glenard Haring.   Per our records you are due for an eye exam. Please contact your eye doctor to schedule an appointment. Please have them send copies of your office visit notes to [place]. Our fax number is [phone].   GO TO THE FRONT DESK Schedule your next appointment for a physical exam in 3 to 4 months    Check the  blood pressure 2 or 3 times a month   BP GOAL is between 110/65 and  135/85. If it is consistently higher or lower, let me know

## 2018-12-29 NOTE — Assessment & Plan Note (Signed)
Upper abdominal discomfort: See last visit, subsequently saw GI, note reviewed, they prescribed ultrasound, patient report it was normal.  He was also recommended PPIs.  Symptoms resolved. HTN: BP today slightly in the low side, at home reportedly normal BPs, continue amlodipine/losartan EtOH: Reports moderation. Anxiety, depression, insomnia: Some stress related to his mother's health but is doing okay.  Request a early Xanax refill.  PDMP reviewed, looks okay.  RF sent. Preventive care: PNM 23 today RTC CPX 3 to 4 months

## 2019-02-14 ENCOUNTER — Other Ambulatory Visit: Payer: Self-pay | Admitting: Internal Medicine

## 2019-02-20 ENCOUNTER — Other Ambulatory Visit: Payer: Self-pay | Admitting: Internal Medicine

## 2019-03-12 ENCOUNTER — Other Ambulatory Visit: Payer: Self-pay | Admitting: Internal Medicine

## 2019-03-13 NOTE — Telephone Encounter (Signed)
PDMP reviewed, prescription sent 

## 2019-03-14 ENCOUNTER — Telehealth: Payer: Self-pay

## 2019-03-14 NOTE — Telephone Encounter (Signed)
Copied from CRM [id number]. Topic: General - Other >> Mar 13, 2019  1:49 PM Burchel, Abbi R wrote: Pt requests a call back from Dr Leta Jungling nurse.  Please call pt:  [phone]

## 2019-03-15 ENCOUNTER — Encounter: Payer: Self-pay | Admitting: Internal Medicine

## 2019-03-16 NOTE — Telephone Encounter (Signed)
Left message on machine to return call if needed.  He does have a mychart that was responded to about vaccine.

## 2019-03-29 ENCOUNTER — Telehealth: Payer: Self-pay | Admitting: Internal Medicine

## 2019-03-29 NOTE — Telephone Encounter (Signed)
Called patient to schedule AWV. Left message for patient to call office to schedule appointment. Last AWV 05/2016. SF

## 2019-04-06 ENCOUNTER — Ambulatory Visit: Payer: Medicare Other

## 2019-04-11 ENCOUNTER — Telehealth: Payer: Self-pay | Admitting: Internal Medicine

## 2019-04-11 NOTE — Telephone Encounter (Signed)
Sent, PDMP okay 

## 2019-04-11 NOTE — Telephone Encounter (Signed)
Alprazolam refill.   Last OV: 12/28/2018  Last Fill:  03/13/2019 #90 and 0RF Pt sig: 1 tab tid prn UDS: 08/24/2018 Low risk

## 2019-05-05 ENCOUNTER — Other Ambulatory Visit: Payer: Self-pay | Admitting: Internal Medicine

## 2019-05-11 ENCOUNTER — Telehealth: Payer: Self-pay | Admitting: Internal Medicine

## 2019-05-11 NOTE — Telephone Encounter (Signed)
PDMP ok, rx sent  

## 2019-05-11 NOTE — Telephone Encounter (Signed)
Alprazolam refill.   Last OV: 12/28/2018 Last Fill: 04/11/2019 #90 and 0RF Pt sig: 1 tab tid prn  UDS: 08/24/2018 Low risk

## 2019-05-24 ENCOUNTER — Other Ambulatory Visit: Payer: Self-pay | Admitting: Internal Medicine

## 2019-05-31 ENCOUNTER — Ambulatory Visit: Payer: Medicare Other | Admitting: Internal Medicine

## 2019-06-15 ENCOUNTER — Other Ambulatory Visit: Payer: Self-pay | Admitting: Internal Medicine

## 2019-06-24 ENCOUNTER — Emergency Department (HOSPITAL_BASED_OUTPATIENT_CLINIC_OR_DEPARTMENT_OTHER)
Admission: EM | Admit: 2019-06-24 | Discharge: 2019-06-25 | Disposition: A | Discharge status: Home / Self Care | Payer: Medicare Other | Attending: Emergency Medicine | Admitting: Emergency Medicine

## 2019-06-24 ENCOUNTER — Other Ambulatory Visit: Payer: Self-pay

## 2019-06-24 DIAGNOSIS — S61411A Laceration without foreign body of right hand, initial encounter: Secondary | ICD-10-CM | POA: Insufficient documentation

## 2019-06-24 DIAGNOSIS — Y929 Unspecified place or not applicable: Secondary | ICD-10-CM | POA: Insufficient documentation

## 2019-06-24 DIAGNOSIS — Z79899 Other long term (current) drug therapy: Secondary | ICD-10-CM | POA: Diagnosis not present

## 2019-06-24 DIAGNOSIS — Y939 Activity, unspecified: Secondary | ICD-10-CM | POA: Diagnosis not present

## 2019-06-24 DIAGNOSIS — I10 Essential (primary) hypertension: Secondary | ICD-10-CM | POA: Diagnosis not present

## 2019-06-24 DIAGNOSIS — Z7902 Long term (current) use of antithrombotics/antiplatelets: Secondary | ICD-10-CM | POA: Insufficient documentation

## 2019-06-24 DIAGNOSIS — Z7982 Long term (current) use of aspirin: Secondary | ICD-10-CM | POA: Insufficient documentation

## 2019-06-24 DIAGNOSIS — W2209XA Striking against other stationary object, initial encounter: Secondary | ICD-10-CM | POA: Diagnosis not present

## 2019-06-24 DIAGNOSIS — Y999 Unspecified external cause status: Secondary | ICD-10-CM | POA: Diagnosis not present

## 2019-06-24 NOTE — ED Triage Notes (Signed)
Laceration to right hand.  Hit hand on door frame trying to break his fall.  Large lac bleeding heavily.  Patient is on asa 81mg  and plavix.

## 2019-06-25 ENCOUNTER — Other Ambulatory Visit: Payer: Self-pay

## 2019-06-25 ENCOUNTER — Encounter (HOSPITAL_BASED_OUTPATIENT_CLINIC_OR_DEPARTMENT_OTHER): Payer: Self-pay | Admitting: Emergency Medicine

## 2019-06-25 MED ORDER — TETANUS-DIPHTH-ACELL PERTUSSIS 5-2.5-18.5 LF-MCG/0.5 IM SUSP
0.5000 mL | Freq: Once | INTRAMUSCULAR | Status: DC
  Filled 2019-06-25: qty 0.5

## 2019-06-25 MED ORDER — LIDOCAINE-EPINEPHRINE (PF) 2 %-1:200000 IJ SOLN
INTRAMUSCULAR | Status: AC
  Administered 2019-06-25: 10 mL
  Filled 2019-06-25: qty 10

## 2019-06-25 NOTE — ED Provider Notes (Signed)
[facility] EMERGENCY DEPARTMENT Provider Note   CSN: [id number] Arrival date & time: 06/24/19  2356     History Chief Complaint  Patient presents with  . Extremity Laceration    Gene [name] is a 79 y.o. male.  The history is provided by the patient.  Laceration Location:  Hand Hand laceration location:  R hand Length:  4cm Pain details:    Quality:  Aching   Severity:  Mild   Timing:  Constant   Progression:  Worsening Relieved by:  Pressure Worsened by:  Nothing Tetanus status:  Up to date Associated symptoms: no fever, no focal weakness and no numbness    Patient with accidental injury to right hand.  He reports he sustained laceration to his right hand on the door frame.  He reports significant bleeding.  He is on aspirin/Plavix    Past Medical History:  Diagnosis Date  . Anemia    "just in 2016 when I was bleeding internally" (08/28/2015)  . Anxiety   . Aortic stenosis   . Arthritis   . Cervical spondylolysis   . Depression   . Diabetic ulcer of left foot (HCC) 08/28/2015   pt states he does not have diabetes  . Family history of anesthesia complication    "father would start seeing things"  . Heart murmur    "related to the aortic stenosis" (08/28/2015)  . History of alcohol abuse   . History of blood transfusion    "just in 2016 when I was bleeding internally" (08/28/2015)  . Hyperlipidemia    high TG  . Hypertension   . Lesion of ulnar nerve 07/22/2012   Bilateral ulnar neuropathies  . Neuropathy   . 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   left  . PVD (peripheral vascular disease) (HCC)    PVD, mild saw Dr Allyson Sabal 2012, intolerant to pletal, Rx observation  . Ulcer of esophagus 2016   "caused by positioning related to foot wound"    Patient Active Problem List   Diagnosis Date Noted  . Mild aortic stenosis 12/30/2015  . Bruit of left carotid artery 10/28/2015  . Bilateral carotid artery stenosis 10/28/2015  . Diabetic  foot ulcer (HCC) 08/29/2015  . Dyslipidemia 08/28/2015  . PCP NOTES >>>> 01/08/2015  . S/P transmetatarsal amputation of foot (HCC) 01/03/2014  . PVD (peripheral vascular disease) (HCC) 01/20/2013  . Peripheral neuropathy (HCC) 04/17/2011  . Annual physical exam 09/26/2010  . Anxiety and depression 06/11/2006  . HTN (hypertension) 06/11/2006    Past Surgical History:  Procedure Laterality Date  . AMPUTATION Left 11/20/13 and 11/24/13   middle  . AMPUTATION Left 01/03/2014   Procedure: Left Transmetatarsal Amputation;  Surgeon: Nadara Mustard, MD;  Location[facility] OR;  Service: Orthopedics;  Laterality: Left;  . AMPUTATION Right 07/27/2014   Procedure: Right 4th Ray Amputation;  Surgeon: Nadara Mustard, MD;  Location: [facility] OR;  Service: Orthopedics;  Laterality: Right;  . AMPUTATION Right 09/12/2014   Procedure: RIGHT TRANSMETATARSAL AMPUTATION;  Surgeon: Nadara Mustard, MD;  Location: [facility] OR;  Service: Orthopedics;  Laterality: Right;  . AMPUTATION Right 09/28/2014   Procedure: Right Below Knee Amputation;  Surgeon: Nadara Mustard, MD;  Location: [facility] OR;  Service: Orthopedics;  Laterality: Right;  . CATARACT EXTRACTION W/ INTRAOCULAR LENS  IMPLANT, BILATERAL Bilateral 05/2015  . ESOPHAGOGASTRODUODENOSCOPY N/A 08/16/2014   Procedure: ESOPHAGOGASTRODUODENOSCOPY (EGD);  Surgeon: Vida Rigger, MD;  Location: [facility] ENDOSCOPY;  Service: Endoscopy;  Laterality: N/A;  .  ESOPHAGOGASTRODUODENOSCOPY (EGD) WITH PROPOFOL N/A 08/17/2014   Procedure: ESOPHAGOGASTRODUODENOSCOPY (EGD) WITH PROPOFOL;  Surgeon: Wonda Horner, MD;  Location: [facility] ENDOSCOPY;  Service: Endoscopy;  Laterality: N/A;  . LAPAROSCOPIC CHOLECYSTECTOMY    . TONSILLECTOMY    . TRANSMETATARSAL AMPUTATION Left 10/2013       Family History  Problem Relation Age of Onset  . Hyperlipidemia Mother        alive, 78 y/o  . Lung cancer Father        asbestos releated  . Colon polyps Father   . Diabetes Neg Hx   . Coronary artery disease Neg Hx   . Prostate  cancer Neg Hx     Social History   Tobacco Use  . Smoking status: Never Smoker  . Smokeless tobacco: Never Used  Substance Use Topics  . Alcohol use: Yes    Alcohol/week: 10.0 standard drinks    Types: 10 Cans of beer per week    Comment: 1 -2 beers qd  . Drug use: No    Home Medications Prior to Admission medications   Medication Sig Start Date End Date Taking? Authorizing Provider  acetaminophen (TYLENOL) 325 MG tablet Take 650 mg by mouth every 6 (six) hours as needed (pain). Reported on 08/27/2015    [provider]  ALPRAZolam Duanne Moron) 0.5 MG tablet TAKE 1 TABLET BY MOUTH THREE TIMES DAILY AS NEEDED FOR ANXIETY 05/11/19   Colon Branch, MD  amLODipine (NORVASC) 5 MG tablet Take 1 tablet (5 mg total) daily by mouth. 01/15/17   Colon Branch, MD  atorvastatin (LIPITOR) 40 MG tablet Take 1 tablet (40 mg total) at bedtime by mouth. 01/15/17   Colon Branch, MD  citalopram (CELEXA) 40 MG tablet Take 1 tablet (40 mg total) by mouth daily. 06/16/19   Colon Branch, MD  clopidogrel (PLAVIX) 75 MG tablet Take 75 mg by mouth daily.    [provider]  fenofibrate 160 MG tablet Take 1 tablet (160 mg total) by mouth daily. 05/24/19   Colon Branch, MD  losartan (COZAAR) 100 MG tablet Take 1 tablet (100 mg total) by mouth daily. 02/15/19   Colon Branch, MD  pregabalin (LYRICA) 150 MG capsule Take 150 mg by mouth 2 (two) times daily.    [provider]  Propylene Glycol (SYSTANE BALANCE OP) Apply 1 drop to eye 2 (two) times daily as needed (dry eyes). Reported on 08/27/2015    [provider]  Psyllium (METAMUCIL PO) Take 5 capsules by mouth daily.     [provider]  pyridOXINE (VITAMIN B-6) 100 MG tablet Take 100 mg by mouth daily.    [provider]  RA ASPIRIN EC 81 MG EC tablet Take 81 mg by mouth daily. 12/06/14   [provider]  tamsulosin (FLOMAX) 0.4 MG CAPS capsule Take 2 capsules (0.8 mg total) by mouth daily. 05/21/17   Colon Branch, MD   thiamine 50 MG tablet Take 50 mg by mouth daily.    [provider]  traZODone (DESYREL) 50 MG tablet Take 1 tablet (50 mg total) by mouth at bedtime. 05/05/19   Colon Branch, MD  vitamin B-12 (CYANOCOBALAMIN) 1000 MCG tablet Take 2,000 mcg by mouth daily.     [provider]    Allergies    Patient has no known allergies.  Review of Systems   Review of Systems  Constitutional: Negative for fever.  Skin: Positive for wound.  Neurological: Negative for  focal weakness, weakness and numbness.    Physical Exam Updated Vital Signs BP 131/74 (BP Location: Left Arm)   Pulse (!) 58   Temp 97.9 F (36.6 C) (Oral)   Resp 18   Ht 1.727 m (5\' 8" )   Wt 91.2 kg   SpO2 98%   BMI 30.56 kg/m   Physical Exam CONSTITUTIONAL: Well developed/well nourished HEAD: Normocephalic/atraumatic EYES: EOMI ENMT: Mask in place NECK: supple no meningeal signs LUNGS:  no apparent distress NEURO: Pt is awake/alert/appropriate, moves all extremitiesx4.  No facial droop.  Patient able make a fist with right hand.  Denies any  sensory deficit to right hand Patient reports chronic weakness with movement of fingers on right hand that is unchanged EXTREMITIES: pulses normal/equal, full ROM, large skin tear noted to right hand with bleeding controlled.  No foreign bodies noted.  See photo below SKIN: warm, color normal PSYCH: no abnormalities of mood noted, alert and oriented to situation  Patient gave verbal permission to utilize photo for medical documentation only The image was not stored on any personal device    ED Results / Procedures / Treatments   Labs (all labs ordered are listed, but only abnormal results are displayed) Labs Reviewed - No data to display  EKG None  Radiology No results found.  Procedures . Laceration Repair  Date/Time: 06/25/2019 1:05 AM Performed by: 06/27/2019, MD Authorized by: Zadie Rhine, MD   Consent:    Consent obtained:  Verbal    Consent given by:  Patient Anesthesia (see MAR for exact dosages):    Anesthesia method:  Local infiltration   Local anesthetic:  Lidocaine 2% WITH epi Laceration details:    Location:  Hand   Hand location:  R hand, dorsum   Length (cm):  4 Repair type:    Repair type:  Simple Pre-procedure details:    Preparation:  Patient was prepped and draped in usual sterile fashion Exploration:    Wound exploration: wound explored through full range of motion and entire depth of wound probed and visualized     Wound extent: no tendon damage noted and no underlying fracture noted     Contaminated: no   Treatment:    Area cleansed with:  Shur-Clens and saline   Amount of cleaning:  Extensive Skin repair:    Repair method:  Sutures   Suture size:  4-0   Suture material:  Prolene   Suture technique:  Simple interrupted   Number of sutures:  8 Approximation:    Approximation:  Loose Post-procedure details:    Dressing:  Non-adherent dressing   Patient tolerance of procedure:  Tolerated well, no immediate complications     Medications Ordered in ED Medications  lidocaine-EPINEPHrine (XYLOCAINE W/EPI) 2 %-1:200000 (PF) injection (has no administration in time range)    ED Course  I have reviewed the triage vital signs and the nursing notes.       MDM Rules/Calculators/A&P                      No bony injury.  No tendon injury noted.  No deformities.  Patient reports chronic weakness of the fingers on right hand that is unchanged.  Discussed need for follow with PCP for wound management as skin tears typically take time to heal Final Clinical Impression(s) / ED Diagnoses Final diagnoses:  Laceration of right hand without foreign body, initial encounter    Rx / DC Orders ED Discharge Orders    None  Zadie Rhine, MD 06/25/19 [phone]

## 2019-06-28 ENCOUNTER — Other Ambulatory Visit: Payer: Self-pay

## 2019-06-28 ENCOUNTER — Encounter: Payer: Self-pay | Admitting: Internal Medicine

## 2019-06-28 ENCOUNTER — Ambulatory Visit: Payer: Medicare Other | Admitting: Internal Medicine

## 2019-06-28 VITALS — BP 124/56 | HR 72 | Temp 97.8°F | Resp 18 | Ht 69.0 in | Wt 196.5 lb

## 2019-06-28 DIAGNOSIS — Z09 Encounter for follow-up examination after completed treatment for conditions other than malignant neoplasm: Secondary | ICD-10-CM | POA: Diagnosis not present

## 2019-06-28 DIAGNOSIS — S61411D Laceration without foreign body of right hand, subsequent encounter: Secondary | ICD-10-CM

## 2019-06-28 NOTE — Progress Notes (Signed)
Subjective:    Patient ID: Gene [name], male    DOB: Oct 09, 1940, 79 y.o.   MRN: [id number]  DOS:  06/28/2019 Type of visit - description: ER follow-up Went to the ER 06/24/2019, he injured his right hand with a door frame.  + Significant bleeding. 8 sutures were placed. Loose approximation.  Since the ER visit, his daughter-in-law is changing the dressings.  Very mild oozing from the distal area of the wound. No fever chills No discharge   Review of Systems See above   Past Medical History:  Diagnosis Date  . Anemia    "just in 2016 when I was bleeding internally" (08/28/2015)  . Anxiety   . Aortic stenosis   . Arthritis   . Cervical spondylolysis   . Depression   . Diabetic ulcer of left foot (HCC) 08/28/2015   pt states he does not have diabetes  . Family history of anesthesia complication    "father would start seeing things"  . Heart murmur    "related to the aortic stenosis" (08/28/2015)  . History of alcohol abuse   . History of blood transfusion    "just in 2016 when I was bleeding internally" (08/28/2015)  . Hyperlipidemia    high TG  . Hypertension   . Lesion of ulnar nerve 07/22/2012   Bilateral ulnar neuropathies  . Neuropathy   . 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   left  . PVD (peripheral vascular disease) (HCC)    PVD, mild saw Dr Allyson Sabal 2012, intolerant to pletal, Rx observation  . Ulcer of esophagus 2016   "caused by positioning related to foot wound"    Past Surgical History:  Procedure Laterality Date  . AMPUTATION Left 11/20/13 and 11/24/13   middle  . AMPUTATION Left 01/03/2014   Procedure: Left Transmetatarsal Amputation;  Surgeon: Nadara Mustard, MD;  Location: [facility] OR;  Service: Orthopedics;  Laterality: Left;  . AMPUTATION Right 07/27/2014   Procedure: Right 4th Ray Amputation;  Surgeon: Nadara Mustard, MD;  Location: [facility] OR;  Service: Orthopedics;  Laterality: Right;  . AMPUTATION Right 09/12/2014   Procedure: RIGHT  TRANSMETATARSAL AMPUTATION;  Surgeon: Nadara Mustard, MD;  Location: [facility] OR;  Service: Orthopedics;  Laterality: Right;  . AMPUTATION Right 09/28/2014   Procedure: Right Below Knee Amputation;  Surgeon: Nadara Mustard, MD;  Location: [facility] OR;  Service: Orthopedics;  Laterality: Right;  . CATARACT EXTRACTION W/ INTRAOCULAR LENS  IMPLANT, BILATERAL Bilateral 05/2015  . ESOPHAGOGASTRODUODENOSCOPY N/A 08/16/2014   Procedure: ESOPHAGOGASTRODUODENOSCOPY (EGD);  Surgeon: Vida Rigger, MD;  Location: [facility] ENDOSCOPY;  Service: Endoscopy;  Laterality: N/A;  . ESOPHAGOGASTRODUODENOSCOPY (EGD) WITH PROPOFOL N/A 08/17/2014   Procedure: ESOPHAGOGASTRODUODENOSCOPY (EGD) WITH PROPOFOL;  Surgeon: Graylin Shiver, MD;  Location: [facility] ENDOSCOPY;  Service: Endoscopy;  Laterality: N/A;  . LAPAROSCOPIC CHOLECYSTECTOMY    . TONSILLECTOMY    . TRANSMETATARSAL AMPUTATION Left 10/2013    Allergies as of 06/28/2019   No Known Allergies     Medication List       Accurate as of June 28, 2019  3:52 PM. If you have any questions, ask your nurse or doctor.        acetaminophen 325 MG tablet Commonly known as: TYLENOL Take 650 mg by mouth every 6 (six) hours as needed (pain). Reported on 08/27/2015   ALPRAZolam 0.5 MG tablet Commonly known as: XANAX TAKE 1 TABLET BY MOUTH THREE TIMES DAILY AS NEEDED FOR ANXIETY   amLODipine 5 MG  tablet Commonly known as: NORVASC Take 1 tablet (5 mg total) daily by mouth.   atorvastatin 40 MG tablet Commonly known as: LIPITOR Take 1 tablet (40 mg total) at bedtime by mouth.   citalopram 40 MG tablet Commonly known as: CELEXA Take 1 tablet (40 mg total) by mouth daily. What changed: how much to take   clopidogrel 75 MG tablet Commonly known as: PLAVIX Take 75 mg by mouth daily.   cycloSPORINE 0.05 % ophthalmic emulsion Commonly known as: RESTASIS Place 2 drops into both eyes daily.   fenofibrate 160 MG tablet Take 1 tablet (160 mg total) by mouth daily.   losartan 100 MG  tablet Commonly known as: COZAAR Take 1 tablet (100 mg total) by mouth daily.   METAMUCIL PO Take 5 capsules by mouth daily.   pregabalin 150 MG capsule Commonly known as: LYRICA Take 150 mg by mouth 2 (two) times daily.   pyridOXINE 100 MG tablet Commonly known as: VITAMIN B-6 Take 100 mg by mouth daily.   RA Aspirin EC 81 MG EC tablet Generic drug: aspirin Take 81 mg by mouth daily.   SYSTANE BALANCE OP Apply 1 drop to eye 2 (two) times daily as needed (dry eyes). Reported on 08/27/2015   tamsulosin 0.4 MG Caps capsule Commonly known as: FLOMAX Take 2 capsules (0.8 mg total) by mouth daily.   thiamine 50 MG tablet Take 50 mg by mouth daily.   traZODone 50 MG tablet Commonly known as: DESYREL Take 1 tablet (50 mg total) by mouth at bedtime.   vitamin B-12 1000 MCG tablet Commonly known as: CYANOCOBALAMIN Take 2,000 mcg by mouth daily.          Objective:   Physical Exam BP (!) 124/56 (BP Location: Left Arm, Patient Position: Sitting, Cuff Size: Normal)   Pulse 72   Temp 97.8 F (36.6 C) (Temporal)   Resp 18   Ht 5\' 9"  (1.753 m)   Wt 196 lb 8 oz (89.1 kg)   SpO2 98%   BMI 29.02 kg/m  General:   Well developed, NAD, BMI noted. HEENT:  Normocephalic . Face symmetric, atraumatic Right hand: See picture Skin: Not pale. Not jaundice Neurologic:  alert & oriented X3.  Speech normal, gait appropriate for age and unassisted Psych--  Cognition and judgment appear intact.  Cooperative with normal attention span and concentration.  Behavior appropriate. No anxious or depressed appearing.         Assessment     Assessement  Hyperglycemia,  (A1c 5.8  2013) HTN Hyperlipidemia Neuropathy , peripheral:  d/t ETOH (per neuro note, Dr 2014,  2014) NCS 05-2011: Moderate neuropathy, low-grade right L5 radiculopathy Bilateral ulnar neuropathy Anxiety, depression , insomnia onset 1995 after a divorce CV: --Aortic stenosis: Mild per echocardiogram 12-2014,  has a soft murmur --PVD  -Used to see  Dr. 01-2015 . Pletal intolerant, ABIs normal but vessels are noncompressible.  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 Area does not seem to be infected, currently with no acute bleeding. Recommend to continue changing dressing daily with Telfa. Reassess here in 2 days, consider remove some or all sutures. Educated about signs of infection.    This visit occurred during the SARS-CoV-2 public health emergency.  Safety protocols were in place, including screening questions prior to the visit, additional usage of staff PPE, and extensive cleaning of exam room while observing appropriate contact time as indicated for disinfecting solutions.

## 2019-06-28 NOTE — Patient Instructions (Signed)
Continue changing the dressings daily  Watch for signs of infection such as redness, swelling, discharge, fever.  If that is the case let us know immediately.    GO TO THE FRONT DESK, PLEASE SCHEDULE YOUR APPOINTMENTS Come back for a wound check in 2 days.  Overbook okay.

## 2019-06-28 NOTE — Progress Notes (Signed)
Pre visit review using our clinic review tool, if applicable. No additional management support is needed unless otherwise documented below in the visit note. 

## 2019-06-29 NOTE — Assessment & Plan Note (Signed)
Wound, right hand: Dressing removed and new dressing applied with Telfa. Area does not seem to be infected, currently with no acute bleeding. Recommend to continue changing dressing daily with Telfa. Reassess here in 2 days, consider remove some or all sutures. Educated about signs of infection.

## 2019-06-30 ENCOUNTER — Other Ambulatory Visit: Payer: Self-pay

## 2019-06-30 ENCOUNTER — Ambulatory Visit: Payer: Medicare Other | Admitting: Internal Medicine

## 2019-06-30 ENCOUNTER — Encounter: Payer: Self-pay | Admitting: Internal Medicine

## 2019-06-30 VITALS — BP 135/83 | HR 66 | Temp 97.2°F | Resp 18 | Ht 69.0 in | Wt 196.5 lb

## 2019-06-30 DIAGNOSIS — S61411D Laceration without foreign body of right hand, subsequent encounter: Secondary | ICD-10-CM

## 2019-06-30 NOTE — Progress Notes (Signed)
Subjective:    Patient ID: Gene [name], male    DOB: 02/17/1941, 79 y.o.   MRN: [id number]  DOS:  06/30/2019 Type of visit - description: Follow-up Since the last office visit he is doing well. Denies fever chills. Pain is not a major issue    Review of Systems See above   Past Medical History:  Diagnosis Date  . Anemia    "just in 2016 when I was bleeding internally" (08/28/2015)  . Anxiety   . Aortic stenosis   . Arthritis   . Cervical spondylolysis   . Depression   . Diabetic ulcer of left foot ([place]) 08/28/2015   pt states he does not have diabetes  . Family history of anesthesia complication    "father would start seeing things"  . Heart murmur    "related to the aortic stenosis" (08/28/2015)  . History of alcohol abuse   . History of blood transfusion    "just in 2016 when I was bleeding internally" (08/28/2015)  . Hyperlipidemia    high TG  . Hypertension   . Lesion of ulnar nerve 07/22/2012   Bilateral ulnar neuropathies  . Neuropathy   . Osteomyelitis of ankle and foot ([place]) 10/2013   left  . PVD (peripheral vascular disease) (HCC)    PVD, mild saw Dr Gwenlyn Found 2012, intolerant to pletal, Rx observation  . Ulcer of esophagus 2016   "caused by positioning related to foot wound"    Past Surgical History:  Procedure Laterality Date  . AMPUTATION Left 11/20/13 and 11/24/13   middle  . AMPUTATION Left 01/03/2014   Procedure: Left Transmetatarsal Amputation;  Surgeon: Newt Minion, MD;  Location: [place];  Service: Orthopedics;  Laterality: Left;  . AMPUTATION Right 07/27/2014   Procedure: Right 4th Ray Amputation;  Surgeon: Newt Minion, MD;  Location: [place];  Service: Orthopedics;  Laterality: Right;  . AMPUTATION Right 09/12/2014   Procedure: RIGHT TRANSMETATARSAL AMPUTATION;  Surgeon: Newt Minion, MD;  Location: [place];  Service: Orthopedics;  Laterality: Right;  . AMPUTATION Right 09/28/2014   Procedure: Right Below Knee Amputation;  Surgeon: Newt Minion,  MD;  Location: [place];  Service: Orthopedics;  Laterality: Right;  . CATARACT EXTRACTION W/ INTRAOCULAR LENS  IMPLANT, BILATERAL Bilateral 05/2015  . ESOPHAGOGASTRODUODENOSCOPY N/A 08/16/2014   Procedure: ESOPHAGOGASTRODUODENOSCOPY (EGD);  Surgeon: Clarene Essex, MD;  Location: [facility] ENDOSCOPY;  Service: Endoscopy;  Laterality: N/A;  . ESOPHAGOGASTRODUODENOSCOPY (EGD) WITH PROPOFOL N/A 08/17/2014   Procedure: ESOPHAGOGASTRODUODENOSCOPY (EGD) WITH PROPOFOL;  Surgeon: Wonda Horner, MD;  Location: [facility] ENDOSCOPY;  Service: Endoscopy;  Laterality: N/A;  . LAPAROSCOPIC CHOLECYSTECTOMY    . TONSILLECTOMY    . TRANSMETATARSAL AMPUTATION Left 10/2013    Allergies as of 06/30/2019   No Known Allergies     Medication List       Accurate as of June 30, 2019 11:59 PM. If you have any questions, ask your nurse or doctor.        acetaminophen 325 MG tablet Commonly known as: TYLENOL Take 650 mg by mouth every 6 (six) hours as needed (pain). Reported on 08/27/2015   ALPRAZolam 0.5 MG tablet Commonly known as: XANAX TAKE 1 TABLET BY MOUTH THREE TIMES DAILY AS NEEDED FOR ANXIETY   amLODipine 5 MG tablet Commonly known as: NORVASC Take 1 tablet (5 mg total) daily by mouth.   atorvastatin 40 MG tablet Commonly known as: LIPITOR Take 1 tablet (40 mg total) at bedtime by mouth.  citalopram 40 MG tablet Commonly known as: CELEXA Take 1 tablet (40 mg total) by mouth daily. What changed: how much to take   clopidogrel 75 MG tablet Commonly known as: PLAVIX Take 75 mg by mouth daily.   cycloSPORINE 0.05 % ophthalmic emulsion Commonly known as: RESTASIS Place 2 drops into both eyes daily.   fenofibrate 160 MG tablet Take 1 tablet (160 mg total) by mouth daily.   losartan 100 MG tablet Commonly known as: COZAAR Take 1 tablet (100 mg total) by mouth daily.   METAMUCIL PO Take 5 capsules by mouth daily.   pregabalin 150 MG capsule Commonly known as: LYRICA Take 150 mg by mouth 2 (two) times  daily.   pyridOXINE 100 MG tablet Commonly known as: VITAMIN B-6 Take 100 mg by mouth daily.   RA Aspirin EC 81 MG EC tablet Generic drug: aspirin Take 81 mg by mouth daily.   SYSTANE BALANCE OP Apply 1 drop to eye 2 (two) times daily as needed (dry eyes). Reported on 08/27/2015   tamsulosin 0.4 MG Caps capsule Commonly known as: FLOMAX Take 2 capsules (0.8 mg total) by mouth daily.   thiamine 50 MG tablet Take 50 mg by mouth daily.   traZODone 50 MG tablet Commonly known as: DESYREL Take 1 tablet (50 mg total) by mouth at bedtime.   vitamin B-12 1000 MCG tablet Commonly known as: CYANOCOBALAMIN Take 2,000 mcg by mouth daily.          Objective:   Physical Exam BP 135/83 (BP Location: Left Arm, Patient Position: Sitting, Cuff Size: Normal)   Pulse 66   Temp (!) 97.2 F (36.2 C) (Temporal)   Resp 18   Ht 5\' 9"  (1.753 m)   Wt 196 lb 8 oz (89.1 kg)   SpO2 97%   BMI 29.02 kg/m  General:   Well developed, NAD, BMI noted. HEENT:  Normocephalic . Face symmetric, atraumatic Skin: Not pale. Not jaundice.  See picture Neurologic:  alert & oriented X3.  Speech normal, gait appropriate for age and unassisted Psych--  Cognition and judgment appear intact.  Cooperative with normal attention span and concentration.  Behavior appropriate. No anxious or depressed appearing.         Assessment      Assessement  Hyperglycemia,  (A1c 5.8  2013) HTN Hyperlipidemia Neuropathy , peripheral:  d/t ETOH (per neuro note, Dr 2014,  2014) NCS 05-2011: Moderate neuropathy, low-grade right L5 radiculopathy Bilateral ulnar neuropathy Anxiety, depression , insomnia onset 1995 after a divorce CV: --Aortic stenosis: Mild per echocardiogram 12-2014, has a soft murmur --PVD  -Used to see  Dr. 01-2015 . Pletal intolerant, ABIs normal but vessels are noncompressible.  -Now sees Dr Allyson Sabal [facility]), s/p angioplasty L dorsalis pedis ~ 09-2015 ---Carotid Artery  Dz: 01-08-1970 at [facility] 12-2015 ---  Stress test at [facility] 02/2016 (-) MSK: --Osteomyelitis --Multiple toes amputations 2014 --Left transmetatarsal amputation 01/03/2014 (osteomyelitis) --Right transmetatarsal amputation 09/12/2014 -- R BKA  09/28/2014 d/  Dehiscence midfoot amputation -- DJD cervical spondylosis  GI bleed, admited 08-2014 >>> EGD  esophageal ulcer (d/t nsaids and doxycycline ?) H/o Alcohol abuse 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  The person who was supposed to help him was not available. He also reports he self stopped aspirin and  Plavix. At this point there is no evidence of infection, I removed few  stitches, left only 3 in place. Plan: Antibiotic ointment at the proximal area of the wound, change dressings daily, warned about signs of infection, change dressings daily. Go back on aspirin and Plavix, if severe bleeding, hold  them again if not simply apply  pressure See AVS RTC 4 days.     This visit occurred during the SARS-CoV-2 public health emergency.  Safety protocols were in place, including screening questions prior to the visit, additional usage of staff PPE, and extensive cleaning of exam room while observing appropriate contact time as indicated for disinfecting solutions.

## 2019-06-30 NOTE — Progress Notes (Signed)
Pre visit review using our clinic review tool, if applicable. No additional management support is needed unless otherwise documented below in the visit note. 

## 2019-06-30 NOTE — Patient Instructions (Addendum)
Change the dressings daily using Telfa  Put some antibiotic ointment in the wound but not in the area that is completely open  Come back in 4 days  Call anytime if redness, discharge, fever or chills indicating infection  Please go back on aspirin and clopidogrel.  Hold the medications again if the bleeding is severe.

## 2019-07-02 NOTE — Assessment & Plan Note (Signed)
Wound, right hand: At the last visit, he got supplies to change his dressings but he did not do so.  The person who was supposed to help him was not available. He also reports he self stopped aspirin and Plavix. At this point there is no evidence of infection, I removed few  stitches, left only 3 in place. Plan: Antibiotic ointment at the proximal area of the wound, change dressings daily, warned about signs of infection, change dressings daily. Go back on aspirin and Plavix, if severe bleeding, hold  them again if not simply apply  pressure See AVS RTC 4 days.

## 2019-07-04 ENCOUNTER — Encounter: Payer: Self-pay | Admitting: Internal Medicine

## 2019-07-04 ENCOUNTER — Ambulatory Visit: Payer: Medicare Other | Admitting: Internal Medicine

## 2019-07-04 ENCOUNTER — Other Ambulatory Visit: Payer: Self-pay

## 2019-07-04 VITALS — BP 123/79 | HR 70 | Temp 97.0°F | Resp 18 | Ht 69.0 in | Wt 195.4 lb

## 2019-07-04 DIAGNOSIS — F419 Anxiety disorder, unspecified: Secondary | ICD-10-CM

## 2019-07-04 DIAGNOSIS — F32A Depression, unspecified: Secondary | ICD-10-CM

## 2019-07-04 DIAGNOSIS — I10 Essential (primary) hypertension: Secondary | ICD-10-CM

## 2019-07-04 DIAGNOSIS — Z79899 Other long term (current) drug therapy: Secondary | ICD-10-CM | POA: Diagnosis not present

## 2019-07-04 DIAGNOSIS — S61411D Laceration without foreign body of right hand, subsequent encounter: Secondary | ICD-10-CM

## 2019-07-04 DIAGNOSIS — F329 Major depressive disorder, single episode, unspecified: Secondary | ICD-10-CM

## 2019-07-04 LAB — CBC WITH DIFFERENTIAL/PLATELET
Basophils Absolute: 0 10*3/uL (ref 0.0–0.1)
Basophils Relative: 0.2 % (ref 0.0–3.0)
Eosinophils Absolute: 0 10*3/uL (ref 0.0–0.7)
Eosinophils Relative: 0.1 % (ref 0.0–5.0)
HCT: 37.7 % — ABNORMAL LOW (ref 39.0–52.0)
Hemoglobin: 12.8 g/dL — ABNORMAL LOW (ref 13.0–17.0)
Lymphocytes Relative: 6.9 % — ABNORMAL LOW (ref 12.0–46.0)
Lymphs Abs: 0.5 10*3/uL — ABNORMAL LOW (ref 0.7–4.0)
MCHC: 33.9 g/dL (ref 30.0–36.0)
MCV: 94.6 fl (ref 78.0–100.0)
Monocytes Absolute: 0.7 10*3/uL (ref 0.1–1.0)
Monocytes Relative: 8.6 % (ref 3.0–12.0)
Neutro Abs: 6.5 10*3/uL (ref 1.4–7.7)
Neutrophils Relative %: 84.2 % — ABNORMAL HIGH (ref 43.0–77.0)
Platelets: 178 10*3/uL (ref 150.0–400.0)
RBC: 3.98 Mil/uL — ABNORMAL LOW (ref 4.22–5.81)
RDW: 14.6 % (ref 11.5–15.5)
WBC: 7.8 10*3/uL (ref 4.0–10.5)

## 2019-07-04 LAB — COMPREHENSIVE METABOLIC PANEL
ALT: 13 U/L (ref 0–53)
AST: 16 U/L (ref 0–37)
Albumin: 4.2 g/dL (ref 3.5–5.2)
Alkaline Phosphatase: 34 U/L — ABNORMAL LOW (ref 39–117)
BUN: 24 mg/dL — ABNORMAL HIGH (ref 6–23)
CO2: 24 mEq/L (ref 19–32)
Calcium: 9.3 mg/dL (ref 8.4–10.5)
Chloride: 104 mEq/L (ref 96–112)
Creatinine, Ser: 1.06 mg/dL (ref 0.40–1.50)
GFR: 67.6 mL/min (ref >60.00)
Glucose, Bld: 105 mg/dL — ABNORMAL HIGH (ref 70–99)
Potassium: 3.9 mEq/L (ref 3.5–5.1)
Sodium: 136 mEq/L (ref 135–145)
Total Bilirubin: 0.5 mg/dL (ref 0.2–1.2)
Total Protein: 6.2 g/dL (ref 6.0–8.3)

## 2019-07-04 NOTE — Progress Notes (Signed)
Pre visit review using our clinic review tool, if applicable. No additional management support is needed unless otherwise documented below in the visit note. 

## 2019-07-04 NOTE — Progress Notes (Signed)
Subjective:    Patient ID: Gene [name], male    DOB: September 19, 1940, 79 y.o.   MRN: [id number]  DOS:  07/04/2019 Type of visit - description: Follow-up Wound care: Since the last office visit, he has been changing the dressing daily, has not noted fever, chills or discharge.   Review of Systems See above   Past Medical History:  Diagnosis Date  . Anemia    "just in 2016 when I was bleeding internally" (08/28/2015)  . Anxiety   . Aortic stenosis   . Arthritis   . Cervical spondylolysis   . Depression   . Diabetic ulcer of left foot ([place]) 08/28/2015   pt states he does not have diabetes  . Family history of anesthesia complication    "father would start seeing things"  . Heart murmur    "related to the aortic stenosis" (08/28/2015)  . History of alcohol abuse   . History of blood transfusion    "just in 2016 when I was bleeding internally" (08/28/2015)  . Hyperlipidemia    high TG  . Hypertension   . Lesion of ulnar nerve 07/22/2012   Bilateral ulnar neuropathies  . Neuropathy   . Osteomyelitis of ankle and foot ([place]) 10/2013   left  . PVD (peripheral vascular disease) (HCC)    PVD, mild saw Dr Gwenlyn Found 2012, intolerant to pletal, Rx observation  . Ulcer of esophagus 2016   "caused by positioning related to foot wound"    Past Surgical History:  Procedure Laterality Date  . AMPUTATION Left 11/20/13 and 11/24/13   middle  . AMPUTATION Left 01/03/2014   Procedure: Left Transmetatarsal Amputation;  Surgeon: Newt Minion, MD;  Location: [place];  Service: Orthopedics;  Laterality: Left;  . AMPUTATION Right 07/27/2014   Procedure: Right 4th Ray Amputation;  Surgeon: Newt Minion, MD;  Location: [place];  Service: Orthopedics;  Laterality: Right;  . AMPUTATION Right 09/12/2014   Procedure: RIGHT TRANSMETATARSAL AMPUTATION;  Surgeon: Newt Minion, MD;  Location: [place];  Service: Orthopedics;  Laterality: Right;  . AMPUTATION Right 09/28/2014   Procedure: Right Below Knee  Amputation;  Surgeon: Newt Minion, MD;  Location: [place];  Service: Orthopedics;  Laterality: Right;  . CATARACT EXTRACTION W/ INTRAOCULAR LENS  IMPLANT, BILATERAL Bilateral 05/2015  . ESOPHAGOGASTRODUODENOSCOPY N/A 08/16/2014   Procedure: ESOPHAGOGASTRODUODENOSCOPY (EGD);  Surgeon: Clarene Essex, MD;  Location[facility] ENDOSCOPY;  Service: Endoscopy;  Laterality: N/A;  . ESOPHAGOGASTRODUODENOSCOPY (EGD) WITH PROPOFOL N/A 08/17/2014   Procedure: ESOPHAGOGASTRODUODENOSCOPY (EGD) WITH PROPOFOL;  Surgeon: Wonda Horner, MD;  Location: [facility] ENDOSCOPY;  Service: Endoscopy;  Laterality: N/A;  . LAPAROSCOPIC CHOLECYSTECTOMY    . TONSILLECTOMY    . TRANSMETATARSAL AMPUTATION Left 10/2013    Allergies as of 07/04/2019   No Known Allergies     Medication List       Accurate as of July 04, 2019  1:57 PM. If you have any questions, ask your nurse or doctor.        acetaminophen 325 MG tablet Commonly known as: TYLENOL Take 650 mg by mouth every 6 (six) hours as needed (pain). Reported on 08/27/2015   ALPRAZolam 0.5 MG tablet Commonly known as: XANAX TAKE 1 TABLET BY MOUTH THREE TIMES DAILY AS NEEDED FOR ANXIETY   amLODipine 5 MG tablet Commonly known as: NORVASC Take 1 tablet (5 mg total) daily by mouth.   atorvastatin 40 MG tablet Commonly known as: LIPITOR Take 1 tablet (40 mg total) at  bedtime by mouth.   citalopram 40 MG tablet Commonly known as: CELEXA Take 1 tablet (40 mg total) by mouth daily.   clopidogrel 75 MG tablet Commonly known as: PLAVIX Take 75 mg by mouth daily.   cycloSPORINE 0.05 % ophthalmic emulsion Commonly known as: RESTASIS Place 2 drops into both eyes daily.   fenofibrate 160 MG tablet Take 1 tablet (160 mg total) by mouth daily.   losartan 100 MG tablet Commonly known as: COZAAR Take 1 tablet (100 mg total) by mouth daily.   METAMUCIL PO Take 5 capsules by mouth daily.   pregabalin 150 MG capsule Commonly known as: LYRICA Take 150 mg by mouth 2 (two) times  daily.   pyridOXINE 100 MG tablet Commonly known as: VITAMIN B-6 Take 100 mg by mouth daily.   RA Aspirin EC 81 MG EC tablet Generic drug: aspirin Take 81 mg by mouth daily.   SYSTANE BALANCE OP Apply 1 drop to eye 2 (two) times daily as needed (dry eyes). Reported on 08/27/2015   tamsulosin 0.4 MG Caps capsule Commonly known as: FLOMAX Take 2 capsules (0.8 mg total) by mouth daily.   thiamine 50 MG tablet Take 50 mg by mouth daily.   traZODone 50 MG tablet Commonly known as: DESYREL Take 1 tablet (50 mg total) by mouth at bedtime.   vitamin B-12 1000 MCG tablet Commonly known as: CYANOCOBALAMIN Take 2,000 mcg by mouth daily.          Objective:   Physical Exam BP 123/79 (BP Location: Left Arm, Patient Position: Sitting, Cuff Size: Normal)   Pulse 70   Temp (!) 97 F (36.1 C) (Temporal)   Resp 18   Ht 5\' 9"  (1.753 m)   Wt 195 lb 6 oz (88.6 kg)   SpO2 96%   BMI 28.85 kg/m  General:   Well developed, NAD, BMI noted. HEENT:  Normocephalic . Face symmetric, atraumatic  Skin: See picture Neurologic:  alert & oriented X3.  Speech normal, gait appropriate for age and unassisted Psych--  Cognition and judgment appear intact.  Cooperative with normal attention span and concentration.  Behavior appropriate. No anxious or depressed appearing.        Assessment      Assessement  Hyperglycemia,  (A1c 5.8  2013) HTN Hyperlipidemia Neuropathy , peripheral:  d/t ETOH (per neuro note, Dr 2014,  2014) NCS 05-2011: Moderate neuropathy, low-grade right L5 radiculopathy Bilateral ulnar neuropathy Anxiety, depression , insomnia onset 1995 after a divorce CV: --Aortic stenosis: Mild per echocardiogram 12-2014, has a soft murmur --PVD  -Used to see  Dr. 01-2015 . Pletal intolerant, ABIs normal but vessels are noncompressible.  -Now sees Dr Allyson Sabal [facility] s/p angioplasty L dorsalis pedis ~ 09-2015 ---Carotid Artery  Dz: 01-08-1970 at [facility] 12-2015 --- Stress test at [facility]  02/2016 (-) MSK: --Osteomyelitis --Multiple toes amputations 2014 --Left transmetatarsal amputation 01/03/2014 (osteomyelitis) --Right transmetatarsal amputation 09/12/2014 -- R BKA  09/28/2014 d/  Dehiscence midfoot amputation -- DJD cervical spondylosis  GI bleed, admited 08-2014 >>> EGD  esophageal ulcer (d/t nsaids and doxycycline ?) H/o Alcohol abuse 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  Would recommend to continue to change dressings. If he is not healing in the next 5 to 7 days, he will call, consider wound care referral. Also educated about infection signs. Wound was  redressed today HTN: On amlodipine, losartan.  Seems well controlled, check CMP and CBC Hyperlipidemia: On Lipitor, controlled per last FLP Anxiety, depression, insomnia: On Xanax, check a UDS. RTC 3 months CPX fasting.    This visit occurred during the SARS-CoV-2 public health emergency.  Safety protocols were in place, including screening questions prior to the visit, additional usage of staff PPE, and extensive cleaning of exam room while observing appropriate contact time as indicated for disinfecting solutions.

## 2019-07-04 NOTE — Patient Instructions (Addendum)
Please schedule Medicare Wellness with Lawanna Kobus.   Per our records you are due for an eye exam. Please contact your eye doctor to schedule an appointment. Please have them send copies of your office visit notes to [place]. Our fax number is [phone].  Continue to changing the dressings daily for 1 week  If you do not see any significant improvement let me know.  Watch for redness, swelling, discharge, fever or chills.  If that is ever the case let me know  GO TO THE LAB : Get the blood work     GO TO THE FRONT DESK, PLEASE SCHEDULE YOUR APPOINTMENTS Come back for   a physical exam in 3 months, fasting

## 2019-07-05 NOTE — Assessment & Plan Note (Signed)
Wound, right hand: All the stitches removed, no obvious evidence of infection.  Would recommend to continue to change dressings. If he is not healing in the next 5 to 7 days, he will call, consider wound care referral. Also educated about infection signs. Wound was redressed today HTN: On amlodipine, losartan.  Seems well controlled, check CMP and CBC Hyperlipidemia: On Lipitor, controlled per last FLP Anxiety, depression, insomnia: On Xanax, check a UDS. RTC 3 months CPX fasting.

## 2019-07-07 ENCOUNTER — Telehealth: Payer: Self-pay | Admitting: Internal Medicine

## 2019-07-07 LAB — PAIN MGMT, PROFILE 8 W/CONF, U
6 Acetylmorphine: NEGATIVE ng/mL
Alcohol Metabolites: POSITIVE ng/mL — AB (ref <500)
Alphahydroxyalprazolam: 311 ng/mL
Alphahydroxymidazolam: NEGATIVE ng/mL
Alphahydroxytriazolam: NEGATIVE ng/mL
Aminoclonazepam: NEGATIVE ng/mL
Amphetamines: NEGATIVE ng/mL
Benzodiazepines: POSITIVE ng/mL
Buprenorphine, Urine: NEGATIVE ng/mL
Cocaine Metabolite: NEGATIVE ng/mL
Creatinine: 155.9 mg/dL
Ethyl Glucuronide (ETG): 15889 ng/mL
Ethyl Sulfate (ETS): 6367 ng/mL
Hydroxyethylflurazepam: NEGATIVE ng/mL
Lorazepam: NEGATIVE ng/mL
MDA: NEGATIVE ng/mL
MDMA: NEGATIVE ng/mL
MDMA: NEGATIVE ng/mL
Marijuana Metabolite: NEGATIVE ng/mL
Nordiazepam: NEGATIVE ng/mL
Opiates: NEGATIVE ng/mL
Oxazepam: NEGATIVE ng/mL
Oxidant: NEGATIVE ug/mL
Oxycodone: NEGATIVE ng/mL
Temazepam: NEGATIVE ng/mL
pH: 5.2 (ref 4.5–9.0)

## 2019-07-07 NOTE — Telephone Encounter (Signed)
Was dispensed a 30-day supply of 06/10/2019, okay to refill

## 2019-07-07 NOTE — Telephone Encounter (Signed)
Alprazolam refill.   Last OV: 07/04/2019 Last Fill: 05/11/2019 #90 and 1RF (several days early) Pt sig: 1 tab tid prn UDS: 07/04/2019 Pending

## 2019-08-08 ENCOUNTER — Telehealth: Payer: Self-pay | Admitting: Internal Medicine

## 2019-08-08 NOTE — Telephone Encounter (Signed)
Alprazolam refill.   Last OV: 07/04/2019 Last Fill: 07/07/2019 #90 and 0RF Pt sig: 1 tab tid prn UDS: 07/04/2019 Low risk

## 2019-08-08 NOTE — Telephone Encounter (Signed)
rx sent

## 2019-09-09 ENCOUNTER — Other Ambulatory Visit: Payer: Self-pay | Admitting: Internal Medicine

## 2019-09-20 ENCOUNTER — Encounter: Payer: Self-pay | Admitting: Internal Medicine

## 2019-10-04 ENCOUNTER — Encounter: Payer: Self-pay | Admitting: Internal Medicine

## 2019-10-04 ENCOUNTER — Other Ambulatory Visit: Payer: Self-pay

## 2019-10-04 ENCOUNTER — Ambulatory Visit (INDEPENDENT_AMBULATORY_CARE_PROVIDER_SITE_OTHER): Payer: Medicare Other | Admitting: Internal Medicine

## 2019-10-04 VITALS — BP 142/83 | HR 83 | Temp 98.0°F | Resp 18 | Ht 69.0 in | Wt 190.5 lb

## 2019-10-04 DIAGNOSIS — R739 Hyperglycemia, unspecified: Secondary | ICD-10-CM | POA: Diagnosis not present

## 2019-10-04 DIAGNOSIS — E785 Hyperlipidemia, unspecified: Secondary | ICD-10-CM

## 2019-10-04 DIAGNOSIS — Z0000 Encounter for general adult medical examination without abnormal findings: Secondary | ICD-10-CM | POA: Diagnosis not present

## 2019-10-04 LAB — LIPID PANEL
Cholesterol: 134 mg/dL (ref 0–200)
HDL: 55.4 mg/dL (ref >39.00)
LDL Cholesterol: 59 mg/dL (ref 0–99)
NonHDL: 78.72
Total CHOL/HDL Ratio: 2
Triglycerides: 101 mg/dL (ref 0.0–149.0)
VLDL: 20.2 mg/dL (ref 0.0–40.0)

## 2019-10-04 LAB — HEMOGLOBIN A1C: Hgb A1c MFr Bld: 5.5 % (ref 4.6–6.5)

## 2019-10-04 LAB — PSA: PSA: 1.36 ng/mL (ref 0.10–4.00)

## 2019-10-04 MED ORDER — ALPRAZOLAM 0.5 MG PO TABS: 0.5000 mg | ORAL_TABLET | Freq: Three times a day (TID) | ORAL | 3 refills | Status: DC | PRN

## 2019-10-04 NOTE — Progress Notes (Signed)
Pre visit review using our clinic review tool, if applicable. No additional management support is needed unless otherwise documented below in the visit note. 

## 2019-10-04 NOTE — Patient Instructions (Addendum)
Please schedule Medicare Wellness with Gene [name].   Per our records you are due for an eye exam. Please contact your eye doctor to schedule an appointment. Please have them send copies of your office visit notes to [place]. Our fax number is [phone].  Get a flu shot this fall  Check the  blood pressure weekly  BP GOAL is between 110/65 and  135/85. If it is consistently higher or lower, let me know  GO TO THE LAB : Get the blood work     GO TO THE FRONT DESK, PLEASE SCHEDULE YOUR APPOINTMENTS Come back for a checkup in 6 months

## 2019-10-04 NOTE — Progress Notes (Signed)
Subjective:    Patient ID: Gene [name], male    DOB: 1940/08/25, 79 y.o.   MRN: [id number]  DOS:  10/04/2019 Type of visit - description: Here for CPX Reports recent back pain, he has been told that back pain is d/t the right leg prosthesis that need to be adjusted, plans to see the [place].  No other concerns  Review of Systems Pacifically denies nausea, vomiting or blood in the stools. Denies LUTS  Other than above, a 14 point review of systems is negative   Past Medical History:  Diagnosis Date  . Anemia    "just in 2016 when I was bleeding internally" (08/28/2015)  . Anxiety   . Aortic stenosis   . Arthritis   . Cervical spondylolysis   . Depression   . Diabetic ulcer of left foot (HCC) 08/28/2015   pt states he does not have diabetes  . Family history of anesthesia complication    "father would start seeing things"  . Heart murmur    "related to the aortic stenosis" (08/28/2015)  . History of alcohol abuse   . History of blood transfusion    "just in 2016 when I was bleeding internally" (08/28/2015)  . Hyperlipidemia    high TG  . Hypertension   . Lesion of ulnar nerve 07/22/2012   Bilateral ulnar neuropathies  . Neuropathy   . 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   left  . PVD (peripheral vascular disease) (HCC)    PVD, mild saw Dr Allyson Sabal 2012, intolerant to pletal, Rx observation  . Ulcer of esophagus 2016   "caused by positioning related to foot wound"    Past Surgical History:  Procedure Laterality Date  . AMPUTATION Left 11/20/13 and 11/24/13   middle  . AMPUTATION Left 01/03/2014   Procedure: Left Transmetatarsal Amputation;  Surgeon: Nadara Mustard, MD;  Location: [facility] OR;  Service: Orthopedics;  Laterality: Left;  . AMPUTATION Right 07/27/2014   Procedure: Right 4th Ray Amputation;  Surgeon: Nadara Mustard, MD;  Location: [facility] OR;  Service: Orthopedics;  Laterality: Right;  . AMPUTATION Right 09/12/2014   Procedure: RIGHT TRANSMETATARSAL AMPUTATION;   Surgeon: Nadara Mustard, MD;  Location: [facility] OR;  Service: Orthopedics;  Laterality: Right;  . AMPUTATION Right 09/28/2014   Procedure: Right Below Knee Amputation;  Surgeon: Nadara Mustard, MD;  Location: [facility] OR;  Service: Orthopedics;  Laterality: Right;  . CATARACT EXTRACTION W/ INTRAOCULAR LENS  IMPLANT, BILATERAL Bilateral 05/2015  . ESOPHAGOGASTRODUODENOSCOPY N/A 08/16/2014   Procedure: ESOPHAGOGASTRODUODENOSCOPY (EGD);  Surgeon: Vida Rigger, MD;  Location: [facility] ENDOSCOPY;  Service: Endoscopy;  Laterality: N/A;  . ESOPHAGOGASTRODUODENOSCOPY (EGD) WITH PROPOFOL N/A 08/17/2014   Procedure: ESOPHAGOGASTRODUODENOSCOPY (EGD) WITH PROPOFOL;  Surgeon: Graylin Shiver, MD;  Location: [facility] ENDOSCOPY;  Service: Endoscopy;  Laterality: N/A;  . LAPAROSCOPIC CHOLECYSTECTOMY    . TONSILLECTOMY    . TRANSMETATARSAL AMPUTATION Left 10/2013    Allergies as of 10/04/2019   No Known Allergies     Medication List       Accurate as of October 04, 2019 11:59 PM. If you have any questions, ask your nurse or doctor.        acetaminophen 325 MG tablet Commonly known as: TYLENOL Take 650 mg by mouth every 6 (six) hours as needed (pain). Reported on 08/27/2015   ALPRAZolam 0.5 MG tablet Commonly known as: XANAX Take 1 tablet (0.5 mg total) by mouth 3 (three) times daily as needed. for anxiety  amLODipine 5 MG tablet Commonly known as: NORVASC Take 1 tablet (5 mg total) daily by mouth.   atorvastatin 40 MG tablet Commonly known as: LIPITOR Take 1 tablet (40 mg total) at bedtime by mouth.   citalopram 40 MG tablet Commonly known as: CELEXA Take 1 tablet (40 mg total) by mouth daily.   clopidogrel 75 MG tablet Commonly known as: PLAVIX Take 75 mg by mouth daily.   cycloSPORINE 0.05 % ophthalmic emulsion Commonly known as: RESTASIS Place 2 drops into both eyes daily.   fenofibrate 160 MG tablet Take 1 tablet (160 mg total) by mouth daily.   losartan 100 MG tablet Commonly known as: COZAAR Take 1 tablet  (100 mg total) by mouth daily.   METAMUCIL PO Take 5 capsules by mouth daily.   multivitamin with minerals Tabs tablet Take 1 tablet by mouth daily.   pregabalin 150 MG capsule Commonly known as: LYRICA Take 150 mg by mouth 2 (two) times daily.   pyridOXINE 100 MG tablet Commonly known as: VITAMIN B-6 Take 100 mg by mouth daily.   RA Aspirin EC 81 MG EC tablet Generic drug: aspirin Take 81 mg by mouth daily.   SYSTANE BALANCE OP Apply 1 drop to eye 2 (two) times daily as needed (dry eyes). Reported on 08/27/2015   tamsulosin 0.4 MG Caps capsule Commonly known as: FLOMAX Take 2 capsules (0.8 mg total) by mouth daily.   thiamine 50 MG tablet Take 50 mg by mouth daily.   traZODone 50 MG tablet Commonly known as: DESYREL Take 1 tablet (50 mg total) by mouth at bedtime.   vitamin B-12 1000 MCG tablet Commonly known as: CYANOCOBALAMIN Take 2,000 mcg by mouth daily.          Objective:   Physical Exam BP (!) 142/83 (BP Location: Left Arm, Patient Position: Sitting, Cuff Size: Normal)   Pulse 83   Temp 98 F (36.7 C) (Oral)   Resp 18   Ht 5\' 9"  (1.753 m)   Wt 190 lb 8 oz (86.4 kg)   SpO2 96%   BMI 28.13 kg/m  General: Well developed, NAD, BMI noted HEENT:  Normocephalic . Face symmetric, atraumatic Lungs:  CTA B Normal respiratory effort, no intercostal retractions, no accessory muscle use. Heart: RRR, soft systolic murmur.  Abdomen:  Not distended, soft, non-tender. No rebound or rigidity.   Lower extremities: no pretibial edema bilaterally  Skin: Exposed areas without rash. Not pale. Not jaundice Neurologic:  alert & oriented X3.  Speech normal, gait consistent with previous amputations. Strength symmetric and appropriate for age.  Psych: Cognition and judgment appear intact.  Cooperative with normal attention span and concentration.  Behavior appropriate. No anxious or depressed appearing.     Assessment     Assessement  Hyperglycemia,  (A1c  5.8  2013) HTN Hyperlipidemia Neuropathy , peripheral:  d/t ETOH (per neuro note, Dr 2014,  2014) NCS 05-2011: Moderate neuropathy, low-grade right L5 radiculopathy Bilateral ulnar neuropathy Anxiety, depression , insomnia onset 1995 after a divorce CV: --Aortic stenosis: Mild per echocardiogram 12-2014, has a soft murmur --PVD  -Used to see  Dr. 01-2015 . Pletal intolerant, ABIs normal but vessels are noncompressible.  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 Cardiovascular: Sees Dr. Smith Robert every 6 months.  History of aortic stenosis, soft systolic murmur noted. DJD, MSK problems: Back pain, reportedly saw orthopedic surgery, was told related to her prosthesis, right leg.  [facility] of EtOH: States drinks 2 beers daily Skin cancer, history of, sees Derm RTC 6 months   This visit occurred during the SARS-CoV-2 public health emergency.  Safety protocols were in place, including screening questions prior to the visit, additional usage of staff PPE, and extensive cleaning of exam room while observing appropriate contact time as indicated for disinfecting solutions.

## 2019-10-05 ENCOUNTER — Encounter: Payer: Self-pay | Admitting: Internal Medicine

## 2019-10-05 ENCOUNTER — Other Ambulatory Visit: Payer: Self-pay | Admitting: Internal Medicine

## 2019-10-05 NOTE — Assessment & Plan Note (Signed)
-  Td 2017 - pnm 23: 2011 and 2020; prevnar 2016 - zostavax ~2012, s/p shingrix -Had Covid vaccination -Rec flu shot q. Year ---several Cscopes, see PMH, last  05-2016, next per GI ---DRE and PSA normal 08/2018; request a psa, no sxs reported today ---labs : FLP, A1c, PSA. - diet  discussed

## 2019-10-05 NOTE — Assessment & Plan Note (Signed)
Here for CPX Hyperglycemia: Check A1c HTN: Seems controlled, continue current medications, checking labs Hyperlipidemia: Patient fasted, checking a lipid profile, continue Lipitor and fenofibrate. Cardiovascular: Sees Dr. Smith Robert every 6 months.  History of aortic stenosis, soft systolic murmur noted. DJD, MSK problems: Back pain, reportedly saw orthopedic surgery, was told related to her prosthesis, right leg.  [facility] History of EtOH: States drinks 2 beers daily Skin cancer, history of, sees Derm RTC 6 months

## 2019-12-08 ENCOUNTER — Other Ambulatory Visit: Payer: Self-pay | Admitting: Internal Medicine

## 2019-12-15 ENCOUNTER — Other Ambulatory Visit: Payer: Self-pay | Admitting: Internal Medicine

## 2020-01-16 ENCOUNTER — Telehealth: Payer: Self-pay | Admitting: Internal Medicine

## 2020-01-16 DIAGNOSIS — I739 Peripheral vascular disease, unspecified: Secondary | ICD-10-CM

## 2020-01-16 DIAGNOSIS — I6523 Occlusion and stenosis of bilateral carotid arteries: Secondary | ICD-10-CM

## 2020-01-16 NOTE — Telephone Encounter (Signed)
Patient would like a referral to Verne Carrow MD cardiology. [phone]

## 2020-01-16 NOTE — Telephone Encounter (Signed)
Referral placed.

## 2020-01-17 NOTE — Telephone Encounter (Signed)
Patient called today to follow up on cardiology referral.  I let patient know referral was authorized.  Patient stated he would call cardiology to set up appointment.

## 2020-01-19 ENCOUNTER — Encounter: Payer: Self-pay | Admitting: Internal Medicine

## 2020-01-26 ENCOUNTER — Telehealth: Payer: Self-pay

## 2020-01-26 MED ORDER — ALPRAZOLAM 0.5 MG PO TABS: 0.5000 mg | ORAL_TABLET | Freq: Three times a day (TID) | ORAL | 0 refills | Status: DC | PRN

## 2020-01-26 NOTE — Telephone Encounter (Signed)
Refill Requestion -   MEDICATION: Alrprazolam 0.5  PHARMACY: [facility] on [address]  Comments: Pt is asking for a call back with update  **Let patient know to contact pharmacy at the end of the day to make sure medication is ready. **  ** Please notify patient to allow 48-72 hours to process**  **Encourage patient to contact the pharmacy for refills or they can request refills through [facility]**

## 2020-01-26 NOTE — Telephone Encounter (Signed)
PDMP okay, it is a little early for refill but  I sent a prescription

## 2020-01-26 NOTE — Telephone Encounter (Signed)
Requesting: alprazolam 0.5mg  Contract: 11/24/2017 UDS: 07/04/2019 Last Visit: 10/04/2019 Next Visit: 04/05/2020  Last Refill: 10/04/2019 #90 and 3RF Pt sig: 1 tab tid prn  Please Advise

## 2020-01-30 ENCOUNTER — Other Ambulatory Visit: Payer: Self-pay | Admitting: Internal Medicine

## 2020-02-05 ENCOUNTER — Encounter: Payer: Self-pay | Admitting: Internal Medicine

## 2020-02-26 ENCOUNTER — Telehealth: Payer: Self-pay

## 2020-02-26 MED ORDER — ALPRAZOLAM 0.5 MG PO TABS: 0.5000 mg | ORAL_TABLET | Freq: Three times a day (TID) | ORAL | 1 refills | Status: DC | PRN

## 2020-02-26 NOTE — Telephone Encounter (Signed)
PDMP okay, Rx sent with 1 refill 

## 2020-02-26 NOTE — Telephone Encounter (Signed)
Please advise 

## 2020-02-26 NOTE — Telephone Encounter (Signed)
Pt called requesting a refill on Xanax 0.5MG  tab.  Pharmacy is [facility].  Pt is requesting more than one refill so that he does not have to call monthly.

## 2020-02-27 ENCOUNTER — Other Ambulatory Visit: Payer: Self-pay | Admitting: Internal Medicine

## 2020-02-27 NOTE — Telephone Encounter (Signed)
Patient calling to follow up on med refill

## 2020-03-05 ENCOUNTER — Other Ambulatory Visit: Payer: Self-pay | Admitting: Internal Medicine

## 2020-03-21 ENCOUNTER — Encounter: Payer: Self-pay | Admitting: Cardiovascular Disease

## 2020-03-21 ENCOUNTER — Ambulatory Visit (INDEPENDENT_AMBULATORY_CARE_PROVIDER_SITE_OTHER): Payer: Medicare Other | Admitting: Cardiovascular Disease

## 2020-03-21 ENCOUNTER — Other Ambulatory Visit: Payer: Self-pay

## 2020-03-21 VITALS — BP 146/74 | HR 63 | Ht 69.0 in | Wt 202.0 lb

## 2020-03-21 DIAGNOSIS — I350 Nonrheumatic aortic (valve) stenosis: Secondary | ICD-10-CM

## 2020-03-21 NOTE — Patient Instructions (Signed)
Medication Instructions:  Your physician recommends that you continue on your current medications as directed. Please refer to the Current Medication list given to you today.   *If you need a refill on your cardiac medications before your next appointment, please call your pharmacy*   Lab Work: none If you have labs (blood work) drawn today and your tests are completely normal, you will receive your results only by: . MyChart Message (if you have MyChart) OR . A paper copy in the mail If you have any lab test that is abnormal or we need to change your treatment, we will call you to review the results.   Testing/Procedures: Your physician has requested that you have an echocardiogram. Echocardiography is a painless test that uses sound waves to create images of your heart. It provides your doctor with information about the size and shape of your heart and how well your heart's chambers and valves are working. This procedure takes approximately one hour. There are no restrictions for this procedure.    Follow-Up: At [facility], you and your health needs are our priority.  As part of our continuing mission to provide you with exceptional heart care, we have created designated Provider Care Teams.  These Care Teams include your primary Cardiologist (physician) and Advanced Practice Providers (APPs -  Physician Assistants and Nurse Practitioners) who all work together to provide you with the care you need, when you need it.  We recommend signing up for the patient portal called "MyChart".  Sign up information is provided on this After Visit Summary.  MyChart is used to connect with patients for Virtual Visits (Telemedicine).  Patients are able to view lab/test results, encounter notes, upcoming appointments, etc.  Non-urgent messages can be sent to your provider as well.   To learn more about what you can do with MyChart, go to [email or web address].    Your next appointment:   12  month(s)  The format for your next appointment:   In Person  Provider:   You may see Christopher McAlhany, MD or one of the following Advanced Practice Providers on your designated Care Team:    Dayna Dunn, PA-C  Michele Lenze, PA-C    Other Instructions   

## 2020-03-21 NOTE — Progress Notes (Addendum)
Chief Complaint  Patient presents with  . New Patient (Initial Visit)    CAD      History of Present Illness: 80 yo male with history of PAD, HTN, hyperlipidemia and aortic stenosis who is here today as a new patient for the evaluation of his aortic stenosis. 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 He has been followed by Dr. Smith Robert in [place], [place] for his cardiac disease and PAD. He is known to have mild aortic stenosis by echo December 2019 at [facility]. Echo December 2019 with LVEF=60-65%, grade 1 diastolic dysfunction. Mild AS with mean gradient 14 mmHg, mild AI. Normal stress test in December 2017. He now wishes to have his cardiac issues followed in [place]. He lives in [place]. He will continue to follow with Dr. Smith Robert for his PAD.   He tells me today that he feels well. He has no chest pain, dyspnea, palpitations, left LE edema, dizziness. He is limited somewhat in activity by his prosthetic leg.   Primary Care Physician: [name], MD   Past Medical History:  Diagnosis Date  . Anemia    "just in 2016 when I was bleeding internally" (08/28/2015)  . Anxiety   . Aortic stenosis   . Arthritis   . Cervical spondylolysis   . Depression   . Diabetic ulcer of left foot (HCC) 08/28/2015   pt states he does not have diabetes  . Family history of anesthesia complication    "father would start seeing things"  . Heart murmur    "related to the aortic stenosis" (08/28/2015)  . History of alcohol abuse   . History of blood transfusion    "just in 2016 when I was bleeding internally" (08/28/2015)  . Hyperlipidemia    high TG  . Hypertension   . Lesion of ulnar nerve 07/22/2012   Bilateral ulnar neuropathies  . Neuropathy   . 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   left  . PVD (peripheral vascular disease) (HCC)    PVD, mild saw Dr Allyson Sabal 2012, intolerant to pletal, Rx observation  . Ulcer of esophagus 2016   "caused by positioning related  to foot wound"    Past Surgical History:  Procedure Laterality Date  . AMPUTATION Left 11/20/13 and 11/24/13   middle  . AMPUTATION Left 01/03/2014   Procedure: Left Transmetatarsal Amputation;  Surgeon: Nadara Mustard, MD;  Location: [facility] OR;  Service: Orthopedics;  Laterality: Left;  . AMPUTATION Right 07/27/2014   Procedure: Right 4th Ray Amputation;  Surgeon: Nadara Mustard, MD;  Location: [facility] OR;  Service: Orthopedics;  Laterality: Right;  . AMPUTATION Right 09/12/2014   Procedure: RIGHT TRANSMETATARSAL AMPUTATION;  Surgeon: Nadara Mustard, MD;  Location: [facility] OR;  Service: Orthopedics;  Laterality: Right;  . AMPUTATION Right 09/28/2014   Procedure: Right Below Knee Amputation;  Surgeon: Nadara Mustard, MD;  Location: [facility] OR;  Service: Orthopedics;  Laterality: Right;  . CATARACT EXTRACTION W/ INTRAOCULAR LENS  IMPLANT, BILATERAL Bilateral 05/2015  . ESOPHAGOGASTRODUODENOSCOPY N/A 08/16/2014   Procedure: ESOPHAGOGASTRODUODENOSCOPY (EGD);  Surgeon: Vida Rigger, MD;  Location: [facility] ENDOSCOPY;  Service: Endoscopy;  Laterality: N/A;  . ESOPHAGOGASTRODUODENOSCOPY (EGD) WITH PROPOFOL N/A 08/17/2014   Procedure: ESOPHAGOGASTRODUODENOSCOPY (EGD) WITH PROPOFOL;  Surgeon: Graylin Shiver, MD;  Location: [facility] ENDOSCOPY;  Service: Endoscopy;  Laterality: N/A;  . LAPAROSCOPIC CHOLECYSTECTOMY    . TONSILLECTOMY    . TRANSMETATARSAL AMPUTATION Left 10/2013  Current Outpatient Medications  Medication Sig Dispense Refill  . acetaminophen (TYLENOL) 325 MG tablet Take 650 mg by mouth every 6 (six) hours as needed (pain). Reported on 08/27/2015    . ALPRAZolam (XANAX) 0.5 MG tablet Take 1 tablet (0.5 mg total) by mouth 3 (three) times daily as needed. for anxiety 90 tablet 1  . amLODipine (NORVASC) 5 MG tablet Take 1 tablet (5 mg total) daily by mouth. 30 tablet 5  . Ascorbic Acid (VITAMIN C) 1000 MG tablet Take 3,000 mg by mouth daily.    Marland Kitchen atorvastatin (LIPITOR) 40 MG tablet Take 1 tablet (40 mg total) at bedtime by mouth.  30 tablet 5  . citalopram (CELEXA) 40 MG tablet Take 1 tablet (40 mg total) by mouth daily. 90 tablet 0  . clopidogrel (PLAVIX) 75 MG tablet Take 75 mg by mouth daily.    . cycloSPORINE (RESTASIS) 0.05 % ophthalmic emulsion Place 2 drops into both eyes daily.    . fenofibrate 160 MG tablet Take 1 tablet (160 mg total) by mouth daily. 90 tablet 1  . losartan (COZAAR) 100 MG tablet Take 1 tablet (100 mg total) by mouth daily. 90 tablet 1  . Multiple Vitamin (MULTIVITAMIN WITH MINERALS) TABS tablet Take 1 tablet by mouth daily.    . pregabalin (LYRICA) 150 MG capsule Take 150 mg by mouth 2 (two) times daily.    Marland Kitchen Propylene Glycol (SYSTANE BALANCE OP) Apply 1 drop to eye 2 (two) times daily as needed (dry eyes). Reported on 08/27/2015    . Psyllium (METAMUCIL PO) Take 5 capsules by mouth daily.     Marland Kitchen pyridOXINE (VITAMIN B-6) 100 MG tablet Take 100 mg by mouth daily.    Marland Kitchen RA ASPIRIN EC 81 MG EC tablet Take 81 mg by mouth daily.  0  . tamsulosin (FLOMAX) 0.4 MG CAPS capsule Take 2 capsules (0.8 mg total) by mouth daily.    Marland Kitchen thiamine 50 MG tablet Take 50 mg by mouth daily.    . traZODone (DESYREL) 100 MG tablet Take 100 mg by mouth at bedtime.    . vitamin B-12 (CYANOCOBALAMIN) 1000 MCG tablet Take 2,000 mcg by mouth daily.      No current facility-administered medications for this visit.    No Known Allergies  Social History   Socioeconomic History  . Marital status: Divorced    Spouse name: Not on file  . Number of children: 3  . Years of education: College  . Highest education level: Not on file  Occupational History  . Occupation: retired  Tobacco Use  . Smoking status: Never Smoker  . Smokeless tobacco: Never Used  Vaping Use  . Vaping Use: Never used  Substance and Sexual Activity  . Alcohol use: Yes    Alcohol/week: 10.0 standard drinks    Types: 10 Cans of beer per week    Comment: 1 -2 beers qd  . Drug use: No  . Sexual activity: Not Currently  Other Topics Concern  .  Not on file  Social History Narrative    Divorced since 1994; lives by himself, 3 sons, 1 GD  (all live close by [place], [place], [place])   [place] veteran     Social Determinants of Health   Financial Resource Strain: Not on [employer] Insecurity: Not on file  Transportation Needs: Not on file  Physical Activity: Not on file  Stress: Not on file  Social Connections: Not on file  Intimate Partner Violence: Not on file  Family History  Problem Relation Age of Onset  . Hyperlipidemia Mother        alive, 70 y/o  . Lung cancer Father        asbestos releated  . Colon polyps Father   . Diabetes Neg Hx   . Coronary artery disease Neg Hx   . Prostate cancer Neg Hx     Review of Systems:  As stated in the HPI and otherwise negative.   BP (!) 146/74   Pulse 63   Ht 5\' 9"  (1.753 m)   Wt 202 lb (91.6 kg)   SpO2 97%   BMI 29.83 kg/m   Physical Examination: General: Well developed, well nourished, NAD  HEENT: OP clear, mucus membranes moist  SKIN: warm, dry. No rashes. Neuro: No focal deficits  Musculoskeletal: Muscle strength 5/5 all ext  Psychiatric: Mood and affect normal  Neck: No JVD, no carotid bruits, no thyromegaly, no lymphadenopathy.  Lungs:Clear bilaterally, no wheezes, rhonci, crackles Cardiovascular: Regular rate and rhythm. Systolic murmur.  Abdomen:Soft. Bowel sounds present. Non-tender.  Extremities: Right BKA. No LLE edema  EKG:  EKG is ordered today. The ekg ordered today demonstrates Sinus  Recent Labs: 07/04/2019: ALT 13; BUN 24; Creatinine, Ser 1.06; Hemoglobin 12.8; Platelets 178.0; Potassium 3.9; Sodium 136   Lipid Panel    Component Value Date/Time   CHOL 134 10/04/2019 1338   TRIG 101.0 10/04/2019 1338   TRIG 202 (HH) 01/07/2006 1456   HDL 55.40 10/04/2019 1338   CHOLHDL 2 10/04/2019 1338   VLDL 20.2 10/04/2019 1338   LDLCALC 59 10/04/2019 1338   LDLDIRECT 68.2 01/20/2013 1055     Wt Readings from Last 3 Encounters:  03/21/20 202 lb  (91.6 kg)  10/04/19 190 lb 8 oz (86.4 kg)  07/04/19 195 lb 6 oz (88.6 kg)      Assessment and Plan:   1. Aortic stenosis: Mild by echo in December 2019. He has no dyspnea or chest pain. Will repeat echo now.   2. PAD: Followed in January 2020 by Dr. [place].   3. HTN: BP controlled. Continue current therapy  4. Hyperlipidemia: LDL 59 in July 2021. Continue statin  Current medicines are reviewed at length with the patient today.  The patient does not have concerns regarding medicines.  The following changes have been made:  no change  Labs/ tests ordered today include:   Orders Placed This Encounter  Procedures  . EKG 12-Lead  . ECHOCARDIOGRAM COMPLETE     Disposition:   F/U with me in one  Year.    Signed, August 2021, MD 03/21/2020 10:19 AM    [facility] [address] [place], [place], [place]  [place] Phone: [phone]; Fax: [phone]

## 2020-04-05 ENCOUNTER — Ambulatory Visit: Payer: Medicare Other | Admitting: Internal Medicine

## 2020-04-17 ENCOUNTER — Ambulatory Visit (HOSPITAL_COMMUNITY): Payer: Medicare Other | Attending: Cardiovascular Disease

## 2020-04-17 ENCOUNTER — Other Ambulatory Visit: Payer: Self-pay

## 2020-04-17 DIAGNOSIS — I350 Nonrheumatic aortic (valve) stenosis: Secondary | ICD-10-CM | POA: Insufficient documentation

## 2020-04-17 LAB — ECHOCARDIOGRAM COMPLETE
AR max vel: 1.23 cm2
AV Area VTI: 1.29 cm2
AV Area mean vel: 1.32 cm2
AV Mean grad: 17.8 mmHg
AV Peak grad: 31.2 mmHg
Ao pk vel: 2.79 m/s
Area-P 1/2: 2.95 cm2
P 1/2 time: 505 msec
S' Lateral: 2.7 cm

## 2020-04-23 ENCOUNTER — Ambulatory Visit: Payer: Medicare Other | Admitting: Internal Medicine

## 2020-04-23 ENCOUNTER — Other Ambulatory Visit: Payer: Self-pay

## 2020-04-23 VITALS — BP 153/83 | HR 63 | Temp 97.9°F | Ht 69.0 in | Wt 202.0 lb

## 2020-04-23 DIAGNOSIS — F419 Anxiety disorder, unspecified: Secondary | ICD-10-CM

## 2020-04-23 DIAGNOSIS — F32A Depression, unspecified: Secondary | ICD-10-CM

## 2020-04-23 DIAGNOSIS — I10 Essential (primary) hypertension: Secondary | ICD-10-CM | POA: Diagnosis not present

## 2020-04-23 DIAGNOSIS — Z79899 Other long term (current) drug therapy: Secondary | ICD-10-CM

## 2020-04-23 DIAGNOSIS — D509 Iron deficiency anemia, unspecified: Secondary | ICD-10-CM

## 2020-04-23 NOTE — Progress Notes (Signed)
Subjective:    Patient ID: Gene [name], male    DOB: 01/25/1941, 80 y.o.   MRN: [id number]  DOS:  04/23/2020 Type of visit - description: Follow-up Since the last office visit reports he is doing very well. Saw cardiology, note reviewed. Saw urology.  BP slightly elevated at the office, he assures me that ambulatory BPs are very good.  Request a refill on Xanax  BP Readings from Last 3 Encounters:  04/23/20 (!) 153/83  03/21/20 (!) 146/74  10/04/19 (!) 142/83     Review of Systems Denies chest pain or difficulty breathing No abdominal pain, nausea, vomiting or diarrhea.  Past Medical History:  Diagnosis Date  . Anemia    "just in 2016 when I was bleeding internally" (08/28/2015)  . Anxiety   . Aortic stenosis   . Arthritis   . Cervical spondylolysis   . Depression   . Diabetic ulcer of left foot (HCC) 08/28/2015   pt states he does not have diabetes  . Family history of anesthesia complication    "father would start seeing things"  . Heart murmur    "related to the aortic stenosis" (08/28/2015)  . History of alcohol abuse   . History of blood transfusion    "just in 2016 when I was bleeding internally" (08/28/2015)  . Hyperlipidemia    high TG  . Hypertension   . Lesion of ulnar nerve 07/22/2012   Bilateral ulnar neuropathies  . Neuropathy   . 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   left  . PVD (peripheral vascular disease) (HCC)    PVD, mild saw Dr Allyson Sabal 2012, intolerant to pletal, Rx observation  . Ulcer of esophagus 2016   "caused by positioning related to foot wound"    Past Surgical History:  Procedure Laterality Date  . AMPUTATION Left 11/20/13 and 11/24/13   middle  . AMPUTATION Left 01/03/2014   Procedure: Left Transmetatarsal Amputation;  Surgeon: Nadara Mustard, MD;  Location: [facility] OR;  Service: Orthopedics;  Laterality: Left;  . AMPUTATION Right 07/27/2014   Procedure: Right 4th Ray Amputation;  Surgeon: Nadara Mustard, MD;  Location: [facility]  OR;  Service: Orthopedics;  Laterality: Right;  . AMPUTATION Right 09/12/2014   Procedure: RIGHT TRANSMETATARSAL AMPUTATION;  Surgeon: Nadara Mustard, MD;  Location: [facility] OR;  Service: Orthopedics;  Laterality: Right;  . AMPUTATION Right 09/28/2014   Procedure: Right Below Knee Amputation;  Surgeon: Nadara Mustard, MD;  Location: [facility] OR;  Service: Orthopedics;  Laterality: Right;  . CATARACT EXTRACTION W/ INTRAOCULAR LENS  IMPLANT, BILATERAL Bilateral 05/2015  . ESOPHAGOGASTRODUODENOSCOPY N/A 08/16/2014   Procedure: ESOPHAGOGASTRODUODENOSCOPY (EGD);  Surgeon: Vida Rigger, MD;  Location: [facility] ENDOSCOPY;  Service: Endoscopy;  Laterality: N/A;  . ESOPHAGOGASTRODUODENOSCOPY (EGD) WITH PROPOFOL N/A 08/17/2014   Procedure: ESOPHAGOGASTRODUODENOSCOPY (EGD) WITH PROPOFOL;  Surgeon: Graylin Shiver, MD;  Location: [facility] ENDOSCOPY;  Service: Endoscopy;  Laterality: N/A;  . LAPAROSCOPIC CHOLECYSTECTOMY    . TONSILLECTOMY    . TRANSMETATARSAL AMPUTATION Left 10/2013    Allergies as of 04/23/2020   No Known Allergies     Medication List       Accurate as of April 23, 2020 11:59 PM. If you have any questions, ask your nurse or doctor.        acetaminophen 325 MG tablet Commonly known as: TYLENOL Take 650 mg by mouth every 6 (six) hours as needed (pain). Reported on 08/27/2015   ALPRAZolam 0.5 MG tablet Commonly known as: Prudy Feeler  Take 1 tablet (0.5 mg total) by mouth 3 (three) times daily as needed. for anxiety   amLODipine 5 MG tablet Commonly known as: NORVASC Take 1 tablet (5 mg total) daily by mouth.   atorvastatin 40 MG tablet Commonly known as: LIPITOR Take 1 tablet (40 mg total) at bedtime by mouth.   citalopram 40 MG tablet Commonly known as: CELEXA Take 1 tablet (40 mg total) by mouth daily.   clopidogrel 75 MG tablet Commonly known as: PLAVIX Take 75 mg by mouth daily.   cycloSPORINE 0.05 % ophthalmic emulsion Commonly known as: RESTASIS Place 2 drops into both eyes daily.   fenofibrate  160 MG tablet Take 1 tablet (160 mg total) by mouth daily.   losartan 100 MG tablet Commonly known as: COZAAR Take 1 tablet (100 mg total) by mouth daily.   METAMUCIL PO Take 5 capsules by mouth daily.   multivitamin with minerals Tabs tablet Take 1 tablet by mouth daily.   pregabalin 150 MG capsule Commonly known as: LYRICA Take 150 mg by mouth 2 (two) times daily. 150mg  total (takes two 75mg  twice daily)   pyridOXINE 100 MG tablet Commonly known as: VITAMIN B-6 Take 100 mg by mouth daily.   RA Aspirin EC 81 MG EC tablet Generic drug: aspirin Take 81 mg by mouth daily.   SYSTANE BALANCE OP Apply 1 drop to eye 2 (two) times daily as needed (dry eyes). Reported on 08/27/2015   tamsulosin 0.4 MG Caps capsule Commonly known as: FLOMAX Take 2 capsules (0.8 mg total) by mouth daily.   thiamine 50 MG tablet Take 50 mg by mouth daily.   traZODone 100 MG tablet Commonly known as: DESYREL Take 100 mg by mouth at bedtime.   vitamin B-12 1000 MCG tablet Commonly known as: CYANOCOBALAMIN Take 2,000 mcg by mouth daily.   vitamin C 1000 MG tablet Take 3,000 mg by mouth daily.          Objective:   Physical Exam BP (!) 153/83 (BP Location: Right Arm, Patient Position: Sitting, Cuff Size: Large)   Pulse 63   Temp 97.9 F (36.6 C) (Oral)   Ht 5\' 9"  (1.753 m)   Wt 202 lb (91.6 kg)   SpO2 96%   BMI 29.83 kg/m  General:   Well developed, NAD, BMI noted. HEENT:  Normocephalic . Face symmetric, atraumatic Lungs:  CTA B Normal respiratory effort, no intercostal retractions, no accessory muscle use. Heart: RRR, soft systolic murmur.  Lower extremities: no pretibial edema bilaterally  Skin: Not pale. Not jaundice Neurologic:  alert & oriented X3.  Speech normal, gait appropriate for history of previous amputations, needs help transferring Psych--  Cognition and judgment appear intact.  Cooperative with normal attention span and concentration.  Behavior  appropriate. No anxious or depressed appearing.      Assessment      Assessement  Hyperglycemia,  (A1c 5.8  2013) HTN Hyperlipidemia Neuropathy , peripheral:  d/t ETOH (per neuro note, Dr 08/29/2015,  2014) NCS 05-2011: Moderate neuropathy, low-grade right L5 radiculopathy Bilateral ulnar neuropathy Anxiety, depression , insomnia onset 1995 after a divorce CV: --Aortic stenosis: Mild per echocardiogram 12-2014, has a soft murmur --PVD  -Used to see  Dr. 2015 . Pletal intolerant, ABIs normal but vessels are noncompressible.  - used to see  Dr 04-26-1994 [facility]), s/p angioplasty L dorsalis pedis ~ 09-2015 - Dr Smith Robert 03/2020  ---Carotid Artery  Dz: 01-08-1970 at [facility] 12-2015, per notes, no significant dz, rx CV RF control ---  Stress test at [facility] 02/2016 (-) MSK: --Osteomyelitis --Multiple toes amputations 2014 --Left transmetatarsal amputation 01/03/2014 (osteomyelitis) --Right transmetatarsal amputation 09/12/2014 -- R BKA  09/28/2014 d/  Dehiscence midfoot amputation -- DJD cervical spondylosis  GI bleed, admited 08-2014 >>> EGD  esophageal ulcer (d/t nsaids and doxycycline ?) H/o Alcohol abuse Skin ca: R ear 10/2017 [facility] and [facility] as well  L eye blind   PLAN HTN: BP slightly elevated today, he assures me BPs @  Home  are normal.  Continue amlodipine, losartan, check a CMP. Hyperlipidemia: Well-controlled per last FLP, on Lipitor Anxiety, depression, insomnia: Very well controlled with citalopram, trazodone, Xanax (RF sent).  Check UDS, contract signed.. Cardiovascular: No symptoms.  Review cardiology note Anemia: Mild, per chart review, no GI symptoms, check a CBC, iron, ferritin BPH, LUTS: Saw urology 03/13/2020, dx was  BPH with LUTS, symptoms stable,  was also rec  a cystoscopy that the patient so far is declining. RTC 5 months CPX   This visit occurred during the SARS-CoV-2 public health emergency.  Safety protocols were in place, including screening questions prior  to the visit, additional usage of staff PPE, and extensive cleaning of exam room while observing appropriate contact time as indicated for disinfecting solutions.

## 2020-04-23 NOTE — Patient Instructions (Addendum)
Per our records you are due for an eye exam. Please contact your eye doctor to schedule an appointment. Please have them send copies of your office visit notes to [place]. Our fax number is [phone].   Please bring [place] a copy of your Advanced Directives Audiological scientist of Attorney and Living Will). This is for your chart.   Continue checking your blood pressures at home BP GOAL is between 110/65 and  135/85. If it is consistently higher or lower, let me know    GO TO THE LAB : Get the blood work     GO TO THE FRONT DESK, PLEASE SCHEDULE YOUR APPOINTMENTS Come back for a physical exam by 09-2020

## 2020-04-24 LAB — CBC WITH DIFFERENTIAL/PLATELET
Basophils Absolute: 0 10*3/uL (ref 0.0–0.1)
Basophils Relative: 0.4 % (ref 0.0–3.0)
Eosinophils Absolute: 0.1 10*3/uL (ref 0.0–0.7)
Eosinophils Relative: 1.5 % (ref 0.0–5.0)
HCT: 40.3 % (ref 39.0–52.0)
Hemoglobin: 13.3 g/dL (ref 13.0–17.0)
Lymphocytes Relative: 28.4 % (ref 12.0–46.0)
Lymphs Abs: 1.1 10*3/uL (ref 0.7–4.0)
MCHC: 33.1 g/dL (ref 30.0–36.0)
MCV: 93.4 fl (ref 78.0–100.0)
Monocytes Absolute: 0.5 10*3/uL (ref 0.1–1.0)
Monocytes Relative: 12.5 % — ABNORMAL HIGH (ref 3.0–12.0)
Neutro Abs: 2.3 10*3/uL (ref 1.4–7.7)
Neutrophils Relative %: 57.2 % (ref 43.0–77.0)
Platelets: 167 10*3/uL (ref 150.0–400.0)
RBC: 4.32 Mil/uL (ref 4.22–5.81)
RDW: 14 % (ref 11.5–15.5)
WBC: 4 10*3/uL (ref 4.0–10.5)

## 2020-04-24 LAB — COMPREHENSIVE METABOLIC PANEL
ALT: 16 U/L (ref 0–53)
AST: 22 U/L (ref 0–37)
Albumin: 4.1 g/dL (ref 3.5–5.2)
Alkaline Phosphatase: 33 U/L — ABNORMAL LOW (ref 39–117)
BUN: 20 mg/dL (ref 6–23)
CO2: 24 mEq/L (ref 19–32)
Calcium: 9.3 mg/dL (ref 8.4–10.5)
Chloride: 106 mEq/L (ref 96–112)
Creatinine, Ser: 1.07 mg/dL (ref 0.40–1.50)
GFR: 66.41 mL/min (ref >60.00)
Glucose, Bld: 92 mg/dL (ref 70–99)
Potassium: 3.9 mEq/L (ref 3.5–5.1)
Sodium: 138 mEq/L (ref 135–145)
Total Bilirubin: 0.6 mg/dL (ref 0.2–1.2)
Total Protein: 6.4 g/dL (ref 6.0–8.3)

## 2020-04-24 LAB — FERRITIN: Ferritin: 77.8 ng/mL (ref 22.0–322.0)

## 2020-04-24 LAB — IRON: Iron: 109 ug/dL (ref 42–165)

## 2020-04-24 NOTE — Assessment & Plan Note (Signed)
HTN: BP slightly elevated today, he assures me BPs @  Home  are normal.  Continue amlodipine, losartan, check a CMP. Hyperlipidemia: Well-controlled per last FLP, on Lipitor Anxiety, depression, insomnia: Very well controlled with citalopram, trazodone, Xanax (RF sent).  Check UDS, contract signed.. Cardiovascular: No symptoms.  Review cardiology note Anemia: Mild, per chart review, no GI symptoms, check a CBC, iron, ferritin BPH, LUTS: Saw urology 03/13/2020, dx was  BPH with LUTS, symptoms stable,  was also rec  a cystoscopy that the patient so far is declining. RTC 5 months CPX

## 2020-04-26 LAB — DRUG MONITORING, PANEL 8 WITH CONFIRMATION, URINE
6 Acetylmorphine: NEGATIVE ng/mL (ref <10)
Alcohol Metabolites: POSITIVE ng/mL — AB
Alphahydroxyalprazolam: 212 ng/mL — ABNORMAL HIGH (ref <25)
Alphahydroxymidazolam: NEGATIVE ng/mL (ref <50)
Alphahydroxytriazolam: NEGATIVE ng/mL (ref <50)
Aminoclonazepam: NEGATIVE ng/mL (ref <25)
Amphetamines: NEGATIVE ng/mL (ref <500)
Benzodiazepines: POSITIVE ng/mL — AB (ref <100)
Buprenorphine, Urine: NEGATIVE ng/mL (ref <5)
Cocaine Metabolite: NEGATIVE ng/mL (ref <150)
Creatinine: 120.5 mg/dL
Ethyl Glucuronide (ETG): 20577 ng/mL — ABNORMAL HIGH (ref <500)
Ethyl Sulfate (ETS): 7483 ng/mL — ABNORMAL HIGH (ref <100)
Hydroxyethylflurazepam: NEGATIVE ng/mL (ref <50)
Lorazepam: NEGATIVE ng/mL (ref <50)
MDMA: NEGATIVE ng/mL (ref <500)
Marijuana Metabolite: NEGATIVE ng/mL (ref <20)
Nordiazepam: NEGATIVE ng/mL (ref <50)
Opiates: NEGATIVE ng/mL (ref <100)
Oxazepam: NEGATIVE ng/mL (ref <50)
Oxidant: NEGATIVE ug/mL
Oxycodone: NEGATIVE ng/mL (ref <100)
Temazepam: NEGATIVE ng/mL (ref <50)
pH: 5.7 (ref 4.5–9.0)

## 2020-04-26 LAB — DM TEMPLATE

## 2020-04-29 ENCOUNTER — Telehealth: Payer: Self-pay | Admitting: Internal Medicine

## 2020-04-29 NOTE — Telephone Encounter (Signed)
Requesting: alprazolam 0.5mg   Contract: 04/23/2020 UDS: 04/23/2020 Last Visit: 04/23/2020 Next Visit: 09/24/2020 Last Refill: 02/26/2020 #90 and 1RF Pt sig: 1 tab tid prn  Please Advise

## 2020-04-29 NOTE — Telephone Encounter (Signed)
PDMP okay, Rx sent 

## 2020-05-30 ENCOUNTER — Other Ambulatory Visit: Payer: Self-pay | Admitting: Internal Medicine

## 2020-06-29 ENCOUNTER — Other Ambulatory Visit: Payer: Self-pay | Admitting: Internal Medicine

## 2020-07-25 ENCOUNTER — Other Ambulatory Visit: Payer: Self-pay | Admitting: Internal Medicine

## 2020-07-25 NOTE — Telephone Encounter (Signed)
Paz Pt  Requesting: alprazolam 0.5mg  Contract: 04/23/2020 UDS: 04/23/2020 Last Visit: 04/23/2020 Next Visit: 09/24/2020 Last Refill: 04/29/2020 #90 and 0RF  Please Advise

## 2020-08-25 ENCOUNTER — Telehealth: Payer: Self-pay | Admitting: Family Medicine

## 2020-08-26 NOTE — Telephone Encounter (Signed)
Requesting: alprazolam 0.5mg  Contract: 04/23/2020 UDS: 04/23/2020 Last Visit: 04/23/2020 Next Visit: 09/24/2020 Last Refill: 07/25/2020 #90 and 0RF Pt sig: 1 tab tid prn  Please Advise

## 2020-08-26 NOTE — Telephone Encounter (Signed)
  PDMP okay, rx sent  

## 2020-08-27 ENCOUNTER — Other Ambulatory Visit: Payer: Self-pay

## 2020-08-27 ENCOUNTER — Encounter (HOSPITAL_BASED_OUTPATIENT_CLINIC_OR_DEPARTMENT_OTHER): Payer: Self-pay

## 2020-08-27 ENCOUNTER — Emergency Department (HOSPITAL_BASED_OUTPATIENT_CLINIC_OR_DEPARTMENT_OTHER)
Admission: EM | Admit: 2020-08-27 | Discharge: 2020-08-28 | Disposition: A | Discharge status: Home / Self Care | Payer: Medicare Other | Attending: Emergency Medicine | Admitting: Emergency Medicine

## 2020-08-27 DIAGNOSIS — Z79899 Other long term (current) drug therapy: Secondary | ICD-10-CM | POA: Diagnosis not present

## 2020-08-27 DIAGNOSIS — W271XXA Contact with garden tool, initial encounter: Secondary | ICD-10-CM | POA: Insufficient documentation

## 2020-08-27 DIAGNOSIS — S61412A Laceration without foreign body of left hand, initial encounter: Secondary | ICD-10-CM | POA: Insufficient documentation

## 2020-08-27 DIAGNOSIS — E1140 Type 2 diabetes mellitus with diabetic neuropathy, unspecified: Secondary | ICD-10-CM | POA: Diagnosis not present

## 2020-08-27 DIAGNOSIS — Z7902 Long term (current) use of antithrombotics/antiplatelets: Secondary | ICD-10-CM | POA: Diagnosis not present

## 2020-08-27 DIAGNOSIS — I10 Essential (primary) hypertension: Secondary | ICD-10-CM | POA: Insufficient documentation

## 2020-08-27 DIAGNOSIS — Z23 Encounter for immunization: Secondary | ICD-10-CM | POA: Diagnosis not present

## 2020-08-27 DIAGNOSIS — S6992XA Unspecified injury of left wrist, hand and finger(s), initial encounter: Secondary | ICD-10-CM | POA: Diagnosis present

## 2020-08-27 MED ORDER — CEPHALEXIN 250 MG PO CAPS: 250.0000 mg | ORAL_CAPSULE | Freq: Three times a day (TID) | ORAL | 0 refills | Status: AC

## 2020-08-27 MED ORDER — CEPHALEXIN 250 MG PO CAPS
250.0000 mg | ORAL_CAPSULE | Freq: Once | ORAL | Status: AC
  Administered 2020-08-27: 250 mg via ORAL
  Filled 2020-08-27: qty 1

## 2020-08-27 MED ORDER — LIDOCAINE HCL (PF) 1 % IJ SOLN
INTRAMUSCULAR | Status: AC
  Administered 2020-08-27: 5 mL via INTRADERMAL
  Filled 2020-08-27: qty 5

## 2020-08-27 MED ORDER — TETANUS-DIPHTH-ACELL PERTUSSIS 5-2.5-18.5 LF-MCG/0.5 IM SUSY
0.5000 mL | PREFILLED_SYRINGE | Freq: Once | INTRAMUSCULAR | Status: AC
  Administered 2020-08-27: 0.5 mL via INTRAMUSCULAR
  Filled 2020-08-27: qty 0.5

## 2020-08-27 MED ORDER — LIDOCAINE HCL (PF) 1 % IJ SOLN: 5.0000 mL | Freq: Once | INTRAMUSCULAR | Status: AC

## 2020-08-27 NOTE — ED Triage Notes (Signed)
Pt cut left hand on law sprinkler ~30 min PTA-large skin tear with bleeding noted-gauze/kling dsg applied in triage-NAD-steady gait

## 2020-08-27 NOTE — Discharge Instructions (Addendum)
Return in 10-14 days for suture removal.

## 2020-08-27 NOTE — ED Provider Notes (Signed)
[facility] EMERGENCY DEPARTMENT Provider Note  CSN: [id number] Arrival date & time: 08/27/20 1931  Chief Complaint(s) Hand Injury  HPI Gene [name] is a 80 y.o. male sustaining hand lacerations or earlier this evening.  Reports that he cut his hand on a sprinkler while he was trying to pick it up.  He states for lost his balance.  Did not hit his hand on top of this provider.  He denied any other injuries due to the fall.  He reports he is not endorsing any pain related to the laceration.  Wound is still bleeding.  He denies any numbness or tingling.  Still able to move all fingers. unsure tetanus status.   Hand Injury  Past Medical History Past Medical History:  Diagnosis Date   Anemia    "just in 2016 when I was bleeding internally" (08/28/2015)   Anxiety    Aortic stenosis    Arthritis    Cervical spondylolysis    Depression    Diabetic ulcer of left foot (HCC) 08/28/2015   pt states he does not have diabetes   Family history of anesthesia complication    "father would start seeing things"   Heart murmur    "related to the aortic stenosis" (08/28/2015)   History of alcohol abuse    History of blood transfusion    "just in 2016 when I was bleeding internally" (08/28/2015)   Hyperlipidemia    high TG   Hypertension    Lesion of ulnar nerve 07/22/2012   Bilateral ulnar neuropathies   Neuropathy    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   left   PVD (peripheral vascular disease) (HCC)    PVD, mild saw Dr Allyson SabalBerry 2012, intolerant to pletal, Rx observation   Ulcer of esophagus 2016   "caused by positioning related to foot wound"   Patient Active Problem List   Diagnosis Date Noted   Mild aortic stenosis 12/30/2015   Bruit of left carotid artery 10/28/2015   Bilateral carotid artery stenosis 10/28/2015   Dyslipidemia 08/28/2015   PCP NOTES >>>> 01/08/2015   S/P transmetatarsal amputation of foot (HCC) 01/03/2014   PVD (peripheral vascular disease)  (HCC) 01/20/2013   Peripheral neuropathy (HCC) 04/17/2011   Annual physical exam 09/26/2010   Anxiety and depression 06/11/2006   HTN (hypertension) 06/11/2006   Home Medication(s) Prior to Admission medications   Medication Sig Start Date End Date Taking? Authorizing Provider  cephALEXin (KEFLEX) 250 MG capsule Take 1 capsule (250 mg total) by mouth 3 (three) times daily for 5 days. 08/27/20 09/01/20 Yes Glorie Dowlen, Amadeo GarnetPedro Eduardo, MD  acetaminophen (TYLENOL) 325 MG tablet Take 650 mg by mouth every 6 (six) hours as needed (pain). Reported on 08/27/2015    [provider]  ALPRAZolam (XANAX) 0.5 MG tablet TAKE 1 TABLET(0.5 MG) BY MOUTH THREE TIMES DAILY AS NEEDED FOR ANXIETY 08/26/20   Wanda PlumpPaz, Jose E, MD  amLODipine (NORVASC) 5 MG tablet Take 1 tablet (5 mg total) daily by mouth. 01/15/17   Wanda PlumpPaz, Jose E, MD  Ascorbic Acid (VITAMIN C) 1000 MG tablet Take 3,000 mg by mouth daily.    [provider]  atorvastatin (LIPITOR) 40 MG tablet Take 1 tablet (40 mg total) at bedtime by mouth. 01/15/17   Wanda PlumpPaz, Jose E, MD  citalopram (CELEXA) 40 MG tablet Take 1 tablet (40 mg total) by mouth daily. 05/30/20   Wanda PlumpPaz, Jose E, MD  clopidogrel (PLAVIX) 75 MG tablet Take 75 mg by mouth daily.  [provider]  cycloSPORINE (RESTASIS) 0.05 % ophthalmic emulsion Place 2 drops into both eyes daily.    [provider]  fenofibrate 160 MG tablet Take 1 tablet (160 mg total) by mouth daily. 07/01/20   [name], MD  losartan (COZAAR) 100 MG tablet Take 1 tablet (100 mg total) by mouth daily. 02/15/19   [name], MD  Multiple Vitamin (MULTIVITAMIN WITH MINERALS) TABS tablet Take 1 tablet by mouth daily.    [provider]  pregabalin (LYRICA) 150 MG capsule Take 150 mg by mouth 2 (two) times daily. 150mg  total (takes two 75mg  twice daily)    [provider]  Propylene Glycol (SYSTANE BALANCE OP) Apply 1 drop to eye 2 (two) times daily as needed (dry eyes). Reported on 08/27/2015     [provider]  Psyllium (METAMUCIL PO) Take 5 capsules by mouth daily.     [provider]  pyridOXINE (VITAMIN B-6) 100 MG tablet Take 100 mg by mouth daily.    [provider]  RA ASPIRIN EC 81 MG EC tablet Take 81 mg by mouth daily. 12/06/14   [provider]  thiamine 50 MG tablet Take 50 mg by mouth daily.    [provider]  traZODone (DESYREL) 100 MG tablet Take 100 mg by mouth at bedtime.    [provider]  vitamin B-12 (CYANOCOBALAMIN) 1000 MCG tablet Take 2,000 mcg by mouth daily.     [provider]                                                                                                                                    Past Surgical History Past Surgical History:  Procedure Laterality Date   AMPUTATION Left 11/20/13 and 11/24/13   middle   AMPUTATION Left 01/03/2014   Procedure: Left Transmetatarsal Amputation;  Surgeon: 11/26/13, MD;  Location: [facility] OR;  Service: Orthopedics;  Laterality: Left;   AMPUTATION Right 07/27/2014   Procedure: Right 4th Ray Amputation;  Surgeon: [facility], MD;  Location: [facility] OR;  Service: Orthopedics;  Laterality: Right;   AMPUTATION Right 09/12/2014   Procedure: RIGHT TRANSMETATARSAL AMPUTATION;  Surgeon: [facility], MD;  Location: [facility] OR;  Service: Orthopedics;  Laterality: Right;   AMPUTATION Right 09/28/2014   Procedure: Right Below Knee Amputation;  Surgeon: Nadara Mustard, MD;  Location: [facility] OR;  Service: Orthopedics;  Laterality: Right;   CATARACT EXTRACTION W/ INTRAOCULAR LENS  IMPLANT, BILATERAL Bilateral 05/2015   ESOPHAGOGASTRODUODENOSCOPY N/A 08/16/2014   Procedure: ESOPHAGOGASTRODUODENOSCOPY (EGD);  Surgeon: 06/2015, MD;  Location[facility] ENDOSCOPY;  Service: Endoscopy;  Laterality: N/A;   ESOPHAGOGASTRODUODENOSCOPY (EGD) WITH PROPOFOL N/A 08/17/2014   Procedure: ESOPHAGOGASTRODUODENOSCOPY (EGD) WITH PROPOFOL;  Surgeon: [facility], MD;  Location: [facility] ENDOSCOPY;   Service: Endoscopy;  Laterality: N/A;   LAPAROSCOPIC CHOLECYSTECTOMY     TONSILLECTOMY     TRANSMETATARSAL AMPUTATION  Left 10/2013   Family History Family History  Problem Relation Age of Onset   Hyperlipidemia Mother        alive, 67 y/o   Lung cancer Father        asbestos releated   Colon polyps Father    Diabetes Neg Hx    Coronary artery disease Neg Hx    Prostate cancer Neg Hx     Social History Social History   Tobacco Use   Smoking status: Never   Smokeless tobacco: Never  Vaping Use   Vaping Use: Never used  Substance Use Topics   Alcohol use: Not Currently    Comment: occ   Drug use: No   Allergies Patient has no known allergies.  Review of Systems Review of Systems All other systems are reviewed and are negative for acute change except as noted in the HPI  Physical Exam Vital Signs  I have reviewed the triage vital signs BP (!) 165/87 (BP Location: Right Arm)   Pulse 66   Temp 98.3 F (36.8 C) (Oral)   Resp 18   Ht 5\' 9"  (1.753 m)   Wt 90.7 kg   SpO2 98%   BMI 29.53 kg/m   Physical Exam Vitals reviewed.  Constitutional:      General: He is not in acute distress.    Appearance: He is well-developed. He is not diaphoretic.  HENT:     Head: Normocephalic and atraumatic.     Right Ear: External ear normal.     Left Ear: External ear normal.     Nose: Nose normal.     Mouth/Throat:     Mouth: Mucous membranes are moist.  Eyes:     General: No scleral icterus.    Conjunctiva/sclera: Conjunctivae normal.  Neck:     Trachea: Phonation normal.  Cardiovascular:     Rate and Rhythm: Normal rate and regular rhythm.  Pulmonary:     Effort: Pulmonary effort is normal. No respiratory distress.     Breath sounds: No stridor.  Abdominal:     General: There is no distension.  Musculoskeletal:        General: Normal range of motion.     Right hand: Laceration (approx 5 cm stellate lac.) present.       Hands:     Cervical back: Normal range of  motion.  Neurological:     Mental Status: He is alert and oriented to person, place, and time.  Psychiatric:        Behavior: Behavior normal.  .  Productive.  ED Results and Treatments Labs (all labs ordered are listed, but only abnormal results are displayed) Labs Reviewed - No data to display                                                                                                                       EKG  EKG Interpretation  Date/Time:    Ventricular Rate:    PR Interval:  QRS Duration:   QT Interval:    QTC Calculation:   R Axis:     Text Interpretation:          Radiology No results found.  Pertinent labs & imaging results that were available during my care of the patient were reviewed by me and considered in my medical decision making (see chart for details).  Medications Ordered in ED Medications  Tdap (BOOSTRIX) injection 0.5 mL (has no administration in time range)  cephALEXin (KEFLEX) capsule 250 mg (has no administration in time range)  lidocaine (PF) (XYLOCAINE) 1 % injection 5 mL (5 mLs Intradermal Given by Other 08/27/20 2325)                                                                                                                                    Procedures .Marland KitchenLaceration Repair  Date/Time: 08/27/2020 11:36 PM Performed by: Nira Conn, MD Authorized by: Nira Conn, MD   Consent:    Consent obtained:  Verbal   Consent given by:  Patient   Risks discussed:  Infection, pain, poor cosmetic result, poor wound healing and vascular damage Universal protocol:    Procedure explained and questions answered to patient or proxy's satisfaction: yes     Immediately prior to procedure, a time out was called: yes     Patient identity confirmed:  Verbally with patient and provided demographic data Anesthesia:    Anesthesia method:  Local infiltration   Local anesthetic:  Lidocaine 1% w/o epi Laceration details:    Location:   Hand   Hand location:  L hand, dorsum   Length (cm):  5   Depth (mm):  6 Pre-procedure details:    Preparation:  Patient was prepped and draped in usual sterile fashion Treatment:    Area cleansed with:  Saline   Amount of cleaning:  Extensive   Irrigation solution:  Sterile saline   Irrigation method:  Pressure wash   Debridement:  Minimal   Undermining:  None Skin repair:    Repair method:  Sutures   Suture size:  3-0   Wound skin closure material used: ethilon.   Suture technique:  Simple interrupted, horizontal mattress and vertical mattress   Number of sutures:  6 Approximation:    Approximation:  Close Repair type:    Repair type:  Simple Post-procedure details:    Dressing:  Non-adherent dressing  (including critical care time)  Medical Decision Making / ED Course I have reviewed the nursing notes for this encounter and the patient's prior records (if available in EHR or on provided paperwork).   Gene Coach [name] was evaluated in Emergency Department on 08/27/2020 for the symptoms described in the history of present illness. He was evaluated in the context of the global COVID-19 pandemic, which necessitated consideration that the patient might be at risk for infection with the SARS-CoV-2 virus that causes COVID-19. Institutional protocols and  algorithms that pertain to the evaluation of patients at risk for COVID-19 are in a state of rapid change based on information released by regulatory bodies including the CDC and federal and state organizations. These policies and algorithms were followed during the patient's care in the ED.  Laceration irrigated and closed as above. Tdap booster given. Ppx keflex.  Suture removal in 10 -14 days      Final Clinical Impression(s) / ED Diagnoses Final diagnoses:  Laceration of left hand, foreign body presence unspecified, initial encounter    The patient appears reasonably screened and/or stabilized for discharge and I  doubt any other medical condition or other [facility] requiring further screening, evaluation, or treatment in the ED at this time prior to discharge. Safe for discharge with strict return precautions.  Disposition: Discharge  Condition: Good  I have discussed the results, Dx and Tx plan with the patient/family who expressed understanding and agree(s) with the plan. Discharge instructions discussed at length. The patient/family was given strict return precautions who verbalized understanding of the instructions. No further questions at time of discharge.    ED Discharge Orders          Ordered    cephALEXin (KEFLEX) 250 MG capsule  3 times daily        08/27/20 2340            Follow Up: [name], MD [address] [place] [address] [phone]     [facility] [place] EM[facility]Y DEPARTMENT [address] [id number] Simonne Come [place] [address] [phone]      This chart was dictated using voice recognition software.  Despite best efforts to proofread,  errors can occur which can change the documentation meaning.    Nira Conn, MD 08/27/20 [phone]

## 2020-09-06 ENCOUNTER — Encounter: Payer: Self-pay | Admitting: Internal Medicine

## 2020-09-06 ENCOUNTER — Ambulatory Visit: Payer: Medicare Other | Admitting: Internal Medicine

## 2020-09-06 ENCOUNTER — Telehealth: Payer: Self-pay

## 2020-09-06 ENCOUNTER — Other Ambulatory Visit: Payer: Self-pay

## 2020-09-06 VITALS — BP 138/60 | HR 80 | Temp 98.0°F | Resp 16 | Ht 69.0 in | Wt 201.1 lb

## 2020-09-06 DIAGNOSIS — S61412D Laceration without foreign body of left hand, subsequent encounter: Secondary | ICD-10-CM | POA: Diagnosis not present

## 2020-09-06 DIAGNOSIS — R519 Headache, unspecified: Secondary | ICD-10-CM | POA: Diagnosis not present

## 2020-09-06 MED ORDER — MUPIROCIN CALCIUM 2 % EX CREA: 1.0000 "application " | TOPICAL_CREAM | Freq: Two times a day (BID) | CUTANEOUS | 0 refills | Status: DC

## 2020-09-06 MED ORDER — MUPIROCIN 2 % EX OINT: 1.0000 "application " | TOPICAL_OINTMENT | Freq: Two times a day (BID) | CUTANEOUS | 0 refills | Status: DC

## 2020-09-06 NOTE — Addendum Note (Signed)
Addended by: [name] E on: 09/06/2020 04:54 PM   Modules accepted: Orders

## 2020-09-06 NOTE — Patient Instructions (Signed)
Apply Bactroban and continue changing the dressing daily.  Was the area looks better, okay to clean it with soap and water.    Call if fever, chills, redness or discharge.

## 2020-09-06 NOTE — Telephone Encounter (Signed)
Spoke w/ Pt- informed that PCP sent ointment hopefully it will be cheaper if not recommended to use OTC ointment/cream. Pt verbalized understanding.

## 2020-09-06 NOTE — Progress Notes (Signed)
Subjective:    Patient ID: Gene [name], male    DOB: June 18, 1940, 80 y.o.   MRN: [id number]  DOS:  09/06/2020 Type of visit - description: acute  Reason for the visit today is right ear pain: This is started about 3 to 4 days ago, actually the pain was not only at the ear but  also at the right side of the skull. He was taking Tylenol with some relief. Today for the first time he is pain-free. He denies any fever chills.  No runny nose or sore throat. No rash. No visual disturbances. No jaw claudication.  Pain was not worse by chewing.  Went to the ER 08/27/2020, he had a hand laceration.  Tdap was provided, sutures placed, Rx cephalexin.  Review of Systems See above   Past Medical History:  Diagnosis Date   Anemia    "just in 2016 when I was bleeding internally" (08/28/2015)   Anxiety    Aortic stenosis    Arthritis    Cervical spondylolysis    Depression    Diabetic ulcer of left foot (HCC) 08/28/2015   pt states he does not have diabetes   Family history of anesthesia complication    "father would start seeing things"   Heart murmur    "related to the aortic stenosis" (08/28/2015)   History of alcohol abuse    History of blood transfusion    "just in 2016 when I was bleeding internally" (08/28/2015)   Hyperlipidemia    high TG   Hypertension    Lesion of ulnar nerve 07/22/2012   Bilateral ulnar neuropathies   Neuropathy    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   left   PVD (peripheral vascular disease) (HCC)    PVD, mild saw Dr Allyson Sabal 2012, intolerant to pletal, Rx observation   Ulcer of esophagus 2016   "caused by positioning related to foot wound"    Past Surgical History:  Procedure Laterality Date   AMPUTATION Left 11/20/13 and 11/24/13   middle   AMPUTATION Left 01/03/2014   Procedure: Left Transmetatarsal Amputation;  Surgeon: Nadara Mustard, MD;  Location: [facility] OR;  Service: Orthopedics;  Laterality: Left;   AMPUTATION Right 07/27/2014    Procedure: Right 4th Ray Amputation;  Surgeon: Nadara Mustard, MD;  Location: [facility] OR;  Service: Orthopedics;  Laterality: Right;   AMPUTATION Right 09/12/2014   Procedure: RIGHT TRANSMETATARSAL AMPUTATION;  Surgeon: Nadara Mustard, MD;  Location: [facility] OR;  Service: Orthopedics;  Laterality: Right;   AMPUTATION Right 09/28/2014   Procedure: Right Below Knee Amputation;  Surgeon: Nadara Mustard, MD;  Location: [facility] OR;  Service: Orthopedics;  Laterality: Right;   CATARACT EXTRACTION W/ INTRAOCULAR LENS  IMPLANT, BILATERAL Bilateral 05/2015   ESOPHAGOGASTRODUODENOSCOPY N/A 08/16/2014   Procedure: ESOPHAGOGASTRODUODENOSCOPY (EGD);  Surgeon: Vida Rigger, MD;  Location: [facility] ENDOSCOPY;  Service: Endoscopy;  Laterality: N/A;   ESOPHAGOGASTRODUODENOSCOPY (EGD) WITH PROPOFOL N/A 08/17/2014   Procedure: ESOPHAGOGASTRODUODENOSCOPY (EGD) WITH PROPOFOL;  Surgeon: Graylin Shiver, MD;  Location: [facility] ENDOSCOPY;  Service: Endoscopy;  Laterality: N/A;   LAPAROSCOPIC CHOLECYSTECTOMY     TONSILLECTOMY     TRANSMETATARSAL AMPUTATION Left 10/2013    Allergies as of 09/06/2020   No Known Allergies      Medication List        Accurate as of September 06, 2020  1:27 PM. If you have any questions, ask your nurse or doctor.  acetaminophen 325 MG tablet Commonly known as: TYLENOL Take 650 mg by mouth every 6 (six) hours as needed (pain). Reported on 08/27/2015   ALPRAZolam 0.5 MG tablet Commonly known as: XANAX TAKE 1 TABLET(0.5 MG) BY MOUTH THREE TIMES DAILY AS NEEDED FOR ANXIETY   amLODipine 5 MG tablet Commonly known as: NORVASC Take 1 tablet (5 mg total) daily by mouth.   atorvastatin 40 MG tablet Commonly known as: LIPITOR Take 1 tablet (40 mg total) at bedtime by mouth.   citalopram 40 MG tablet Commonly known as: CELEXA Take 1 tablet (40 mg total) by mouth daily.   clopidogrel 75 MG tablet Commonly known as: PLAVIX Take 75 mg by mouth daily.   cycloSPORINE 0.05 % ophthalmic emulsion Commonly known as:  RESTASIS Place 2 drops into both eyes daily.   fenofibrate 160 MG tablet Take 1 tablet (160 mg total) by mouth daily.   losartan 100 MG tablet Commonly known as: COZAAR Take 1 tablet (100 mg total) by mouth daily.   METAMUCIL PO Take 5 capsules by mouth daily.   multivitamin with minerals Tabs tablet Take 1 tablet by mouth daily.   pregabalin 150 MG capsule Commonly known as: LYRICA Take 150 mg by mouth 2 (two) times daily. 150mg  total (takes two 75mg  twice daily)   pyridOXINE 100 MG tablet Commonly known as: VITAMIN B-6 Take 100 mg by mouth daily.   RA Aspirin EC 81 MG EC tablet Generic drug: aspirin Take 81 mg by mouth daily.   SYSTANE BALANCE OP Apply 1 drop to eye 2 (two) times daily as needed (dry eyes). Reported on 08/27/2015   thiamine 50 MG tablet Take 50 mg by mouth daily.   traZODone 100 MG tablet Commonly known as: DESYREL Take 100 mg by mouth at bedtime.   vitamin B-12 1000 MCG tablet Commonly known as: CYANOCOBALAMIN Take 2,000 mcg by mouth daily.   vitamin C 1000 MG tablet Take 3,000 mg by mouth daily.           Objective:   Physical Exam BP 138/60 (BP Location: Left Arm, Patient Position: Sitting, Cuff Size: Normal)   Pulse 80   Temp 98 F (36.7 C) (Oral)   Resp 16   Ht 5\' 9"  (1.753 m)   Wt 201 lb 2 oz (91.2 kg)   SpO2 96%   BMI 29.70 kg/m  General:   Well developed, NAD, BMI noted. HEENT:  Normocephalic . Face symmetric, atraumatic. TMs normal, canals normal TMJ: No TTP, no click. Nose not congested. Palpation of the temples: Not tender. Hand:  Unable to take a picture. Wound with no redness, discharge, there is 1 area where I saw bleeding that stopped quickly with pressure. Neurologic:  alert & oriented X3.  Speech normal, gait appropriate for age and unassisted Psych--  Cognition and judgment appear intact.  Cooperative with normal attention span and concentration.  Behavior appropriate. No anxious or depressed  appearing.      Assessment     Assessement  Hyperglycemia,  (A1c 5.8  2013) HTN Hyperlipidemia Neuropathy , peripheral:  d/t ETOH (per neuro note, Dr 08/29/2015,  2014) NCS 05-2011: Moderate neuropathy, low-grade right L5 radiculopathy Bilateral ulnar neuropathy Anxiety, depression , insomnia onset 1995 after a divorce CV: --Aortic stenosis: Mild per echocardiogram 12-2014, has a soft murmur --PVD  -Used to see  Dr. 2015 . Pletal intolerant, ABIs normal but vessels are noncompressible.  - used to see  Dr 04-26-1994 [facility]), s/p angioplasty L dorsalis pedis ~ 09-2015 -  Dr Clifton James 03/2020  ---Carotid Artery  Dz: [place] at [facility] 12-2015, per notes, no significant dz, rx CV RF control --- Stress test at [facility] 02/2016 (-) MSK: --Osteomyelitis --Multiple toes amputations 2014 --Left transmetatarsal amputation 01/03/2014 (osteomyelitis) --Right transmetatarsal amputation 09/12/2014 -- R BKA  09/28/2014 d/  Dehiscence midfoot amputation -- DJD cervical spondylosis  GI bleed, admited 08-2014 >>> EGD  esophageal ulcer (d/t nsaids and doxycycline ?) H/o Alcohol abuse 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 There is no rash, the temporary artery is not tender to palpation, no jaw claudication.  Symptoms self resolved. I recommend observation, if symptoms resurface definitely let me know, if symptoms severe needs to seek medical attention. R hand wound: Although the wound is not completely healed, the next time I can   remove his stitches is in 4 days, the wound may get infected by then.  I elected to remove the stitches, the area was redressed, recommend Bactroban, see AVS.    This visit occurred during the SARS-CoV-2 public health emergency.  Safety protocols were in place, including screening questions prior to the visit, additional usage of staff PPE,  and extensive cleaning of exam room while observing appropriate contact time as indicated for disinfecting solutions.

## 2020-09-06 NOTE — Telephone Encounter (Signed)
Advise patient, I sent a ointment instead.  If that is still too expensive, will recommend OTC antibiotic ointment or cream

## 2020-09-06 NOTE — Telephone Encounter (Signed)
Pt called stating Bactroban is $240 and needs an alternative

## 2020-09-08 NOTE — Assessment & Plan Note (Signed)
R cephalalgia: As described above, the patient reports ear pain but also some discomfort of the scalp. There is no rash, the temporary artery is not tender to palpation, no jaw claudication.  Symptoms self resolved. I recommend observation, if symptoms resurface definitely let me know, if symptoms severe needs to seek medical attention. R hand wound: Although the wound is not completely healed, the next time I can   remove his stitches is in 4 days, the wound may get infected by then.  I elected to remove the stitches, the area was redressed, recommend Bactroban, see AVS.

## 2020-09-24 ENCOUNTER — Other Ambulatory Visit: Payer: Self-pay

## 2020-09-24 ENCOUNTER — Encounter: Payer: Self-pay | Admitting: Internal Medicine

## 2020-09-24 ENCOUNTER — Ambulatory Visit (INDEPENDENT_AMBULATORY_CARE_PROVIDER_SITE_OTHER): Payer: Medicare Other | Admitting: Internal Medicine

## 2020-09-24 VITALS — BP 123/58 | HR 62 | Temp 98.0°F | Resp 18 | Wt 198.6 lb

## 2020-09-24 DIAGNOSIS — R739 Hyperglycemia, unspecified: Secondary | ICD-10-CM

## 2020-09-24 DIAGNOSIS — Z89511 Acquired absence of right leg below knee: Secondary | ICD-10-CM

## 2020-09-24 DIAGNOSIS — I10 Essential (primary) hypertension: Secondary | ICD-10-CM

## 2020-09-24 DIAGNOSIS — I739 Peripheral vascular disease, unspecified: Secondary | ICD-10-CM

## 2020-09-24 DIAGNOSIS — N400 Enlarged prostate without lower urinary tract symptoms: Secondary | ICD-10-CM

## 2020-09-24 DIAGNOSIS — E785 Hyperlipidemia, unspecified: Secondary | ICD-10-CM | POA: Diagnosis not present

## 2020-09-24 DIAGNOSIS — Z0000 Encounter for general adult medical examination without abnormal findings: Secondary | ICD-10-CM

## 2020-09-24 DIAGNOSIS — G6289 Other specified polyneuropathies: Secondary | ICD-10-CM

## 2020-09-24 NOTE — Patient Instructions (Addendum)
Check the  blood pressure weekly   BP GOAL is between 110/65 and  135/85. If it is consistently higher or lower, let me know     GO TO THE LAB : Get the blood work     GO TO THE FRONT DESK, PLEASE SCHEDULE YOUR APPOINTMENTS Come back for   a check up in 6-8 months

## 2020-09-24 NOTE — Progress Notes (Signed)
Subjective:    Patient ID: Gene [name], male    DOB: 11/23/1940, 80 y.o.   MRN: [id number]  DOS:  09/24/2020 Type of visit - description: CPX  Since the last office visit he is doing well and has no major concerns. Good compliance with medications.  Review of Systems Reports that all his chronic problems are well controlled and has no new symptoms.   Other than above, a 14 point review of systems is negative       Past Medical History:  Diagnosis Date   Anemia    "just in 2016 when I was bleeding internally" (08/28/2015)   Anxiety    Aortic stenosis    Arthritis    Cervical spondylolysis    Depression    Diabetic ulcer of left foot (HCC) 08/28/2015   pt states he does not have diabetes   Family history of anesthesia complication    "father would start seeing things"   Heart murmur    "related to the aortic stenosis" (08/28/2015)   History of alcohol abuse    History of blood transfusion    "just in 2016 when I was bleeding internally" (08/28/2015)   Hyperlipidemia    high TG   Hypertension    Lesion of ulnar nerve 07/22/2012   Bilateral ulnar neuropathies   Neuropathy    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   left   PVD (peripheral vascular disease) (HCC)    PVD, mild saw Dr Allyson Sabal 2012, intolerant to pletal, Rx observation   Ulcer of esophagus 2016   "caused by positioning related to foot wound"    Past Surgical History:  Procedure Laterality Date   AMPUTATION Left 11/20/13 and 11/24/13   middle   AMPUTATION Left 01/03/2014   Procedure: Left Transmetatarsal Amputation;  Surgeon: Nadara Mustard, MD;  Location: [facility] OR;  Service: Orthopedics;  Laterality: Left;   AMPUTATION Right 07/27/2014   Procedure: Right 4th Ray Amputation;  Surgeon: Nadara Mustard, MD;  Location: [facility] OR;  Service: Orthopedics;  Laterality: Right;   AMPUTATION Right 09/12/2014   Procedure: RIGHT TRANSMETATARSAL AMPUTATION;  Surgeon: Nadara Mustard, MD;  Location: [facility] OR;  Service:  Orthopedics;  Laterality: Right;   AMPUTATION Right 09/28/2014   Procedure: Right Below Knee Amputation;  Surgeon: Nadara Mustard, MD;  Location: [facility] OR;  Service: Orthopedics;  Laterality: Right;   CATARACT EXTRACTION W/ INTRAOCULAR LENS  IMPLANT, BILATERAL Bilateral 05/2015   ESOPHAGOGASTRODUODENOSCOPY N/A 08/16/2014   Procedure: ESOPHAGOGASTRODUODENOSCOPY (EGD);  Surgeon: Vida Rigger, MD;  Location: [facility] ENDOSCOPY;  Service: Endoscopy;  Laterality: N/A;   ESOPHAGOGASTRODUODENOSCOPY (EGD) WITH PROPOFOL N/A 08/17/2014   Procedure: ESOPHAGOGASTRODUODENOSCOPY (EGD) WITH PROPOFOL;  Surgeon: Graylin Shiver, MD;  Location: [facility] ENDOSCOPY;  Service: Endoscopy;  Laterality: N/A;   LAPAROSCOPIC CHOLECYSTECTOMY     TONSILLECTOMY     TRANSMETATARSAL AMPUTATION Left 10/2013   Social History   Socioeconomic History   Marital status: Divorced    Spouse name: Not on file   Number of children: 3   Years of education: College   Highest education level: Not on file  Occupational History   Occupation: retired  Tobacco Use   Smoking status: Never   Smokeless tobacco: Never  Vaping Use   Vaping Use: Never used  Substance and Sexual Activity   Alcohol use: Yes    Comment: occ   Drug use: No   Sexual activity: Not Currently  Other Topics Concern   Not on file  Social History Narrative    Divorced since 1994; lives by himself, 3 sons, 1 GD  (all live close by [place], [place], [place])   [place] veteran     Social Determinants of Health   Financial Resource Strain: Not on file  Food Insecurity: Not on file  Transportation Needs: Not on file  Physical Activity: Not on file  Stress: Not on file  Social Connections: Not on file  Intimate Partner Violence: Not on file    Allergies as of 09/24/2020   No Known Allergies      Medication List        Accurate as of September 24, 2020 11:59 PM. If you have any questions, ask your nurse or doctor.          acetaminophen 325 MG tablet Commonly known as:  TYLENOL Take 650 mg by mouth every 6 (six) hours as needed (pain). Reported on 08/27/2015   ALPRAZolam 0.5 MG tablet Commonly known as: XANAX TAKE 1 TABLET(0.5 MG) BY MOUTH THREE TIMES DAILY AS NEEDED FOR ANXIETY   amLODipine 5 MG tablet Commonly known as: NORVASC Take 1 tablet (5 mg total) daily by mouth.   atorvastatin 40 MG tablet Commonly known as: LIPITOR Take 1 tablet (40 mg total) at bedtime by mouth.   citalopram 40 MG tablet Commonly known as: CELEXA Take 1 tablet (40 mg total) by mouth daily.   clopidogrel 75 MG tablet Commonly known as: PLAVIX Take 75 mg by mouth daily.   cycloSPORINE 0.05 % ophthalmic emulsion Commonly known as: RESTASIS Place 2 drops into both eyes daily.   fenofibrate 160 MG tablet Take 1 tablet (160 mg total) by mouth daily.   losartan 100 MG tablet Commonly known as: COZAAR Take 1 tablet (100 mg total) by mouth daily.   METAMUCIL PO Take 5 capsules by mouth daily.   multivitamin with minerals Tabs tablet Take 1 tablet by mouth daily.   mupirocin ointment 2 % Commonly known as: BACTROBAN Apply 1 application topically 2 (two) times daily.   pregabalin 150 MG capsule Commonly known as: LYRICA Take 150 mg by mouth 2 (two) times daily. 150mg  total (takes two 75mg  twice daily)   pyridOXINE 100 MG tablet Commonly known as: VITAMIN B-6 Take 100 mg by mouth daily.   RA Aspirin EC 81 MG EC tablet Generic drug: aspirin Take 81 mg by mouth daily.   SYSTANE BALANCE OP Apply 1 drop to eye 2 (two) times daily as needed (dry eyes). Reported on 08/27/2015   thiamine 50 MG tablet Take 50 mg by mouth daily.   traZODone 100 MG tablet Commonly known as: DESYREL Take 100 mg by mouth at bedtime.   vitamin B-12 1000 MCG tablet Commonly known as: CYANOCOBALAMIN Take 2,000 mcg by mouth daily.   vitamin C 1000 MG tablet Take 3,000 mg by mouth daily.           Objective:   Physical Exam BP (!) 123/58 (BP Location: Left Arm, Patient  Position: Sitting, Cuff Size: Normal)   Pulse 62   Temp 98 F (36.7 C) (Oral)   Resp 18   Wt 198 lb 9.6 oz (90.1 kg)   SpO2 97%   BMI 29.33 kg/m  General: Well developed, NAD, BMI noted Neck: No  thyromegaly  HEENT:  Normocephalic . Face symmetric, atraumatic Lungs:  CTA B Normal respiratory effort, no intercostal retractions, no accessory muscle use. Heart: RRR, soft systolic murmur.  Abdomen:  Not distended, soft, non-tender. No rebound or rigidity.  Lower extremities: no pretibial edema L leg. Skin: Exposed areas without rash. Not pale. Not jaundice Neurologic:  alert & oriented X3.  Speech normal, gait consistent with previous amputation, right leg. Psych: Cognition and judgment appear intact.  Cooperative with normal attention span and concentration.  Behavior appropriate. No anxious or depressed appearing.     Assessment     Assessement  Hyperglycemia,  (A1c 5.8  2013) HTN Hyperlipidemia Neuropathy , peripheral:  d/t ETOH (per neuro note, Dr Anne Hahn,  2014) NCS 05-2011: Moderate neuropathy, low-grade right L5 radiculopathy Bilateral ulnar neuropathy Anxiety, depression , insomnia onset 1995 after a divorce CV: --Aortic stenosis, Dr. Clifton James: Mild per echocardiogram 12-2014, has a soft murmur --PVD, Dr. Smith Robert ---Carotid Artery  Dz: [place] at [facility] 12-2015, per notes, no significant dz, rx CV RF control --- Stress test at [facility] 02/2016 (-) MSK: --Osteomyelitis --Multiple toes amputations 2014 --Left transmetatarsal amputation 01/03/2014 (osteomyelitis) --Right transmetatarsal amputation 09/12/2014 -- R BKA  09/28/2014 d/  Dehiscence midfoot amputation -- DJD cervical spondylosis  GI bleed, admited 08-2014 >>> EGD  esophageal ulcer (d/t nsaids and doxycycline ?) H/o Alcohol abuse Skin ca: R ear 10/2017 Gets care at [facility] and [facility] as well  L eye blind   PLAN Here for CPX Hyperglycemia,Check A1c. HTN: Seems to be well controlled, continue amlodipine,  losartan, checking labs Hyperlipidemia: On Lipitor, checking labs Neuropathy: Symptoms at baseline, on Lyrica.  To see a podiatry soon for his L foot care.. Cardiovascular: Aortic stenosis, PVD: No symptoms. H/o LE amputation: stable RTC 6 to 8 months.  This visit occurred during the SARS-CoV-2 public health emergency.  Safety protocols were in place, including screening questions prior to the visit, additional usage of staff PPE, and extensive cleaning of exam room while observing appropriate contact time as indicated for disinfecting solutions.

## 2020-09-25 ENCOUNTER — Encounter: Payer: Self-pay | Admitting: Internal Medicine

## 2020-09-25 DIAGNOSIS — Z89519 Acquired absence of unspecified leg below knee: Secondary | ICD-10-CM | POA: Insufficient documentation

## 2020-09-25 LAB — BASIC METABOLIC PANEL
BUN: 17 mg/dL (ref 6–23)
CO2: 23 mEq/L (ref 19–32)
Calcium: 9.3 mg/dL (ref 8.4–10.5)
Chloride: 106 mEq/L (ref 96–112)
Creatinine, Ser: 0.92 mg/dL (ref 0.40–1.50)
GFR: 79.37 mL/min (ref >60.00)
Glucose, Bld: 79 mg/dL (ref 70–99)
Potassium: 4.1 mEq/L (ref 3.5–5.1)
Sodium: 138 mEq/L (ref 135–145)

## 2020-09-25 LAB — LIPID PANEL
Cholesterol: 135 mg/dL (ref 0–200)
HDL: 56.9 mg/dL (ref >39.00)
LDL Cholesterol: 61 mg/dL (ref 0–99)
NonHDL: 78.38
Total CHOL/HDL Ratio: 2
Triglycerides: 86 mg/dL (ref 0.0–149.0)
VLDL: 17.2 mg/dL (ref 0.0–40.0)

## 2020-09-25 LAB — PSA: PSA: 1.09 ng/mL (ref 0.10–4.00)

## 2020-09-25 LAB — HEMOGLOBIN A1C: Hgb A1c MFr Bld: 5.6 % (ref 4.6–6.5)

## 2020-09-25 NOTE — Assessment & Plan Note (Signed)
-  Td 2017 - pnm 23: 2011 and 2020; prevnar 2016 - zostavax ~2012, s/p shingrix - Covid vax x 3, rec booster  -Rec flu shot q year ---several Cscopes, see PMH, last  05-2016: No polyps, Dr Ewing Schlein ---saw urology for BPH 06-2020, plans to see them regulalrly, request a PSA.  Will do ---labs: BMP, FLP, A1c, PSA - diet d/w pt , room for improvement  -Has a POA, recommend to bring a copy.

## 2020-09-25 NOTE — Assessment & Plan Note (Signed)
Here for CPX Hyperglycemia,Check A1c. HTN: Seems to be well controlled, continue amlodipine, losartan, checking labs Hyperlipidemia: On Lipitor, checking labs Neuropathy: Symptoms at baseline, on Lyrica.  To see a podiatry soon for his L foot care.. Cardiovascular: Aortic stenosis, PVD: No symptoms. H/o LE amputation: stable RTC 6 to 8 months.

## 2020-11-22 ENCOUNTER — Telehealth: Payer: Self-pay | Admitting: Internal Medicine

## 2020-11-22 NOTE — Telephone Encounter (Signed)
Prescription sent

## 2020-11-22 NOTE — Telephone Encounter (Signed)
Patient wants [place] to send a refill script for Sunday to his pharmacy, since it will be early if it is sent today. He wants the pharmacy to have the prescription, so he can call Sunday and have it refilled then and not have to wait until Monday/Tuesday.   Medication: ALPRAZolam (XANAX) 0.5 MG tablet   Has the patient contacted their pharmacy? No. (If no, request that the patient contact the pharmacy for the refill.) (If yes, when and what did the pharmacy advise?)  Preferred Pharmacy (with phone number or street name):  [id number] [place],  - [address] AT [facility] [address]  [address] [place] [address]  Phone:  [phone]  Fax:  [phone]  Agent: Please be advised that RX refills may take up to 3 business days. We ask that you follow-up with your pharmacy.

## 2020-11-22 NOTE — Telephone Encounter (Signed)
PDMP okay, prescription sent 

## 2020-11-22 NOTE — Telephone Encounter (Signed)
Please advise 

## 2020-11-22 NOTE — Telephone Encounter (Signed)
Requesting: alprazolam 0.5mg  Contract:04/23/2020 UDS: 04/23/2020 Last Visit: 09/24/2020 Next Visit: 05/28/2021 Last Refill: 08/26/2020 #90 and 2RF Pt sig: 1 tab tid prn  Please Advise

## 2021-01-15 ENCOUNTER — Telehealth: Payer: Self-pay | Admitting: Internal Medicine

## 2021-01-15 NOTE — Telephone Encounter (Signed)
Left message for patient to call back and schedule Medicare Annual Wellness Visit (AWV) in office.   If not able to come in office, please offer to do virtually or by telephone.  Left office number and my jabber [phone].  Last AWV:05/08/2016  Please schedule at anytime with Nurse Health Advisor.

## 2021-01-21 ENCOUNTER — Telehealth: Payer: Self-pay | Admitting: Internal Medicine

## 2021-01-21 NOTE — Telephone Encounter (Signed)
PDMP okay, Rx sent 

## 2021-01-21 NOTE — Telephone Encounter (Signed)
Requesting: alprazolam 0.5mg  Contract: 04/23/2020 UDS: 04/23/2020 Last Visit: 09/24/2020 Next Visit: 05/28/2021 Last Refill: 11/22/2020 #90 and 1RF Pt sig: 1 tab tid prn  Please Advise

## 2021-02-16 ENCOUNTER — Other Ambulatory Visit: Payer: Self-pay | Admitting: Internal Medicine

## 2021-03-17 ENCOUNTER — Other Ambulatory Visit: Payer: Self-pay | Admitting: Internal Medicine

## 2021-04-09 ENCOUNTER — Telehealth: Payer: Self-pay | Admitting: Orthopedic Surgery

## 2021-04-09 NOTE — Telephone Encounter (Signed)
Received medical records release form from patient  

## 2021-04-11 ENCOUNTER — Telehealth: Payer: Self-pay | Admitting: Orthopedic Surgery

## 2021-04-11 NOTE — Telephone Encounter (Signed)
IC patient,lmvm, advised copy of records ready to be picked up.

## 2021-04-17 ENCOUNTER — Telehealth: Payer: Self-pay | Admitting: Internal Medicine

## 2021-04-17 NOTE — Telephone Encounter (Signed)
Left message for patient to call back and schedule Medicare Annual Wellness Visit (AWV) in office.   If not able to come in office, please offer to do virtually or by telephone.  Left office number and my jabber #[phone].  Last AWV:05/08/2016  Please schedule at anytime with Nurse Health Advisor.   

## 2021-04-30 ENCOUNTER — Encounter: Payer: Self-pay | Admitting: Cardiovascular Disease

## 2021-04-30 ENCOUNTER — Ambulatory Visit (INDEPENDENT_AMBULATORY_CARE_PROVIDER_SITE_OTHER): Payer: Medicare Other | Admitting: Cardiovascular Disease

## 2021-04-30 ENCOUNTER — Other Ambulatory Visit: Payer: Self-pay

## 2021-04-30 VITALS — BP 112/64 | HR 70 | Ht 69.0 in | Wt 202.6 lb

## 2021-04-30 DIAGNOSIS — E7800 Pure hypercholesterolemia, unspecified: Secondary | ICD-10-CM

## 2021-04-30 DIAGNOSIS — I350 Nonrheumatic aortic (valve) stenosis: Secondary | ICD-10-CM

## 2021-04-30 DIAGNOSIS — I10 Essential (primary) hypertension: Secondary | ICD-10-CM | POA: Diagnosis not present

## 2021-04-30 NOTE — Patient Instructions (Signed)
Medication Instructions:  Your physician recommends that you continue on your current medications as directed. Please refer to the Current Medication list given to you today.  *If you need a refill on your cardiac medications before your next appointment, please call your pharmacy*   Lab Work: NONE If you have labs (blood work) drawn today and your tests are completely normal, you will receive your results only by: MyChart Message (if you have MyChart) OR A paper copy in the mail If you have any lab test that is abnormal or we need to change your treatment, we will call you to review the results.   Testing/Procedures: Your physician has requested that you have an echocardiogram in February 2024. Echocardiography is a painless test that uses sound waves to create images of your heart. It provides your doctor with information about the size and shape of your heart and how well your hearts chambers and valves are working. This procedure takes approximately one hour. There are no restrictions for this procedure.    Follow-Up: At [facility], you and your health needs are our priority.  As part of our continuing mission to provide you with exceptional heart care, we have created designated Provider Care Teams.  These Care Teams include your primary Cardiologist (physician) and Advanced Practice Providers (APPs -  Physician Assistants and Nurse Practitioners) who all work together to provide you with the care you need, when you need it.  Your next appointment:   1 year(s)  The format for your next appointment:   In Person  Provider:   Verne Carrow, MD

## 2021-04-30 NOTE — Progress Notes (Signed)
Chief Complaint  Patient presents with   Follow-up    Aortic stenosis    History of Present Illness: 81 yo male with history of PAD, HTN, hyperlipidemia and aortic stenosis who is here today for follow up. I saw him as a new patient for the evaluation of his aortic stenosis in January 2022. 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 He has been followed by Dr. Smith Robert in [place], [place] for his cardiac disease and PAD. He is known to have mild aortic stenosis by echo December 2019 at [facility]. Echo December 2019 with LVEF=60-65%, grade 1 diastolic dysfunction. Mild AS with mean gradient 14 mmHg, mild AI. Normal stress test in December 2017. He asked to have his cardiac issues followed here in [place] and he has continued to follow with Dr. Smith Robert for his PAD.  Echo here in February 2022 with LVEF=60-65%. Mild aortic stenosis.   He is here today for follow up. The patient denies any chest pain, dyspnea, palpitations, lower extremity edema, orthopnea, PND, dizziness, near syncope or syncope.   Primary Care Physician: [name], MD   Past Medical History:  Diagnosis Date   Anemia    "just in 2016 when I was bleeding internally" (08/28/2015)   Anxiety    Aortic stenosis    Arthritis    Cervical spondylolysis    Depression    Diabetic ulcer of left foot (HCC) 08/28/2015   pt states he does not have diabetes   Family history of anesthesia complication    "father would start seeing things"   Heart murmur    "related to the aortic stenosis" (08/28/2015)   History of alcohol abuse    History of blood transfusion    "just in 2016 when I was bleeding internally" (08/28/2015)   Hyperlipidemia    high TG   Hypertension    Lesion of ulnar nerve 07/22/2012   Bilateral ulnar neuropathies   Neuropathy    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   left   PVD (peripheral vascular disease) (HCC)    PVD, mild saw Dr Allyson Sabal 2012, intolerant to pletal, Rx observation    Ulcer of esophagus 2016   "caused by positioning related to foot wound"    Past Surgical History:  Procedure Laterality Date   AMPUTATION Left 11/20/13 and 11/24/13   middle   AMPUTATION Left 01/03/2014   Procedure: Left Transmetatarsal Amputation;  Surgeon: Nadara Mustard, MD;  Location: [facility] OR;  Service: Orthopedics;  Laterality: Left;   AMPUTATION Right 07/27/2014   Procedure: Right 4th Ray Amputation;  Surgeon: Nadara Mustard, MD;  Location: [facility] OR;  Service: Orthopedics;  Laterality: Right;   AMPUTATION Right 09/12/2014   Procedure: RIGHT TRANSMETATARSAL AMPUTATION;  Surgeon: Nadara Mustard, MD;  Location: [facility] OR;  Service: Orthopedics;  Laterality: Right;   AMPUTATION Right 09/28/2014   Procedure: Right Below Knee Amputation;  Surgeon: Nadara Mustard, MD;  Location: [facility] OR;  Service: Orthopedics;  Laterality: Right;   CATARACT EXTRACTION W/ INTRAOCULAR LENS  IMPLANT, BILATERAL Bilateral 05/2015   ESOPHAGOGASTRODUODENOSCOPY N/A 08/16/2014   Procedure: ESOPHAGOGASTRODUODENOSCOPY (EGD);  Surgeon: Vida Rigger, MD;  Location: [facility] ENDOSCOPY;  Service: Endoscopy;  Laterality: N/A;   ESOPHAGOGASTRODUODENOSCOPY (EGD) WITH PROPOFOL N/A 08/17/2014   Procedure: ESOPHAGOGASTRODUODENOSCOPY (EGD) WITH PROPOFOL;  Surgeon: Graylin Shiver, MD;  Location[facility]COPY;  Service: Endoscopy;  Laterality: N/A;   LAPAROSCOPIC CHOLECYSTECTOMY     TONSILLECTOMY  TRANSMETATARSAL AMPUTATION Left 10/2013    Current Outpatient Medications  Medication Sig Dispense Refill   acetaminophen (TYLENOL) 325 MG tablet Take 650 mg by mouth every 6 (six) hours as needed (pain). Reported on 08/27/2015     ALPRAZolam (XANAX) 0.5 MG tablet TAKE 1 TABLET(0.5 MG) BY MOUTH THREE TIMES DAILY AS NEEDED FOR ANXIETY 90 tablet 3   amLODipine (NORVASC) 5 MG tablet Take 1 tablet (5 mg total) daily by mouth. 30 tablet 5   Ascorbic Acid (VITAMIN C) 1000 MG tablet Take 3,000 mg by mouth daily.     atorvastatin (LIPITOR) 40 MG tablet Take 1 tablet (40 mg  total) at bedtime by mouth. 30 tablet 5   citalopram (CELEXA) 40 MG tablet TAKE 1 TABLET(40 MG) BY MOUTH DAILY 90 tablet 1   clopidogrel (PLAVIX) 75 MG tablet Take 75 mg by mouth daily.     cycloSPORINE (RESTASIS) 0.05 % ophthalmic emulsion Place 2 drops into both eyes daily.     fenofibrate 160 MG tablet TAKE 1 TABLET(160 MG) BY MOUTH DAILY 90 tablet 1   losartan (COZAAR) 100 MG tablet Take 1 tablet (100 mg total) by mouth daily. 90 tablet 1   Multiple Vitamin (MULTIVITAMIN WITH MINERALS) TABS tablet Take 1 tablet by mouth daily.     mupirocin ointment (BACTROBAN) 2 % Apply 1 application topically 2 (two) times daily. 22 g 0   pregabalin (LYRICA) 150 MG capsule Take 150 mg by mouth 2 (two) times daily. 150mg  total (takes two 75mg  twice daily)     Propylene Glycol (SYSTANE BALANCE OP) Apply 1 drop to eye 2 (two) times daily as needed (dry eyes). Reported on 08/27/2015     Psyllium (METAMUCIL PO) Take 5 capsules by mouth daily.      pyridOXINE (VITAMIN B-6) 100 MG tablet Take 100 mg by mouth daily.     RA ASPIRIN EC 81 MG EC tablet Take 81 mg by mouth daily.  0   thiamine 50 MG tablet Take 50 mg by mouth daily.     traZODone (DESYREL) 100 MG tablet Take 100 mg by mouth at bedtime.     vitamin B-12 (CYANOCOBALAMIN) 1000 MCG tablet Take 2,000 mcg by mouth daily.      No current facility-administered medications for this visit.    No Known Allergies  Social History   Socioeconomic History   Marital status: Divorced    Spouse name: Not on file   Number of children: 3   Years of education: College   Highest education level: Not on file  Occupational History   Occupation: retired  Tobacco Use   Smoking status: Never   Smokeless tobacco: Never  Vaping Use   Vaping Use: Never used  Substance and Sexual Activity   Alcohol use: Yes    Comment: occ   Drug use: No   Sexual activity: Not Currently  Other Topics Concern   Not on file  Social History Narrative    Divorced since 1994;  lives by himself, 3 sons, 1 GD  (all live close by [place], 1995, [place])   [employer] veteran     Social Determinants of Health   Financial Resource Strain: Not on file  Food Insecurity: Not on file  Transportation Needs: Not on file  Physical Activity: Not on file  Stress: Not on file  Social Connections: Not on file  Intimate Partner Violence: Not on file    Family History  Problem Relation Age of Onset   Hyperlipidemia Mother  alive, 56 y/o   Lung cancer Father        asbestos releated   Colon polyps Father    Diabetes Neg Hx    Coronary artery disease Neg Hx    Prostate cancer Neg Hx     Review of Systems:  As stated in the HPI and otherwise negative.   BP 112/64    Pulse 70    Ht 5\' 9"  (1.753 m)    Wt 202 lb 9.6 oz (91.9 kg)    SpO2 98%    BMI 29.92 kg/m   Physical Examination: General: Well developed, well nourished, NAD  HEENT: OP clear, mucus membranes moist  SKIN: warm, dry. No rashes. Neuro: No focal deficits  Musculoskeletal: Muscle strength 5/5 all ext  Psychiatric: Mood and affect normal  Neck: No JVD, no carotid bruits, no thyromegaly, no lymphadenopathy.  Lungs:Clear bilaterally, no wheezes, rhonci, crackles Cardiovascular: Regular rate and rhythm. Harsh systolic murmur.  Abdomen:Soft. Bowel sounds present. Non-tender.  Extremities: No left LE edema. Right leg prosthesis  EKG:  EKG is ordered today. The ekg ordered today demonstrates sinus  Echo February 2022:  1. Left ventricular ejection fraction, by estimation, is 60 to 65%. The  left ventricle has normal function. The left ventricle has no regional  wall motion abnormalities. Left ventricular diastolic parameters were  normal.   2. Right ventricular systolic function is normal. The right ventricular  size is normal.   3. The mitral valve is normal in structure. No evidence of mitral valve  regurgitation. No evidence of mitral stenosis.   4. Tricuspid valve regurgitation is mild to  moderate.   5. The aortic valve has an indeterminate number of cusps. There is  moderate calcification of the aortic valve. There is moderate thickening  of the aortic valve. Aortic valve regurgitation is mild. Mild aortic valve  stenosis.   6. The inferior vena cava is normal in size with greater than 50%  respiratory variability, suggesting right atrial pressure of 3 mmHg.   Recent Labs: 09/24/2020: BUN 17; Creatinine, Ser 0.92; Potassium 4.1; Sodium 138   Lipid Panel    Component Value Date/Time   CHOL 135 09/24/2020 1353   TRIG 86.0 09/24/2020 1353   TRIG 202 (HH) 01/07/2006 1456   HDL 56.90 09/24/2020 1353   CHOLHDL 2 09/24/2020 1353   VLDL 17.2 09/24/2020 1353   LDLCALC 61 09/24/2020 1353   LDLDIRECT 68.2 01/20/2013 1055     Wt Readings from Last 3 Encounters:  04/30/21 202 lb 9.6 oz (91.9 kg)  09/24/20 198 lb 9.6 oz (90.1 kg)  09/06/20 201 lb 2 oz (91.2 kg)     Assessment and Plan:   1. Aortic stenosis: Mild by AS and AI by echo in February 2022. No dyspnea or chest pain. Murmur unchanged on exam. Repeat echo February 2024.   2. PAD: Followed in March 2024 by Dr. [place].   3. HTN: BP is well controlled. No changes today  4. Hyperlipidemia: LDL 61 in July 2022. Continue statin.   Current medicines are reviewed at length with the patient today.  The patient does not have concerns regarding medicines.  The following changes have been made:  no change  Labs/ tests ordered today include:   Orders Placed This Encounter  Procedures   EKG 12-Lead   ECHOCARDIOGRAM COMPLETE     Disposition:   F/U with me in one  Year.    Signed, August 2022, MD 04/30/2021 4:49 PM    [facility] [place], [place], [place]  [address] Phone: [phone]; Fax: [phone]

## 2021-05-16 ENCOUNTER — Telehealth: Payer: Self-pay | Admitting: Internal Medicine

## 2021-05-19 NOTE — Telephone Encounter (Signed)
Requesting: alprazolam 0.5mg  ?Contract: 04/23/20 ?UDS:  04/23/20 ?Last Visit: 09/24/20 ?Next Visit: 05/28/2021 ?Last Refill: 01/21/2021 #90 and 3RF ?Pt sig: 1 tab tid prn ? ?Please Advise ? ?

## 2021-05-19 NOTE — Telephone Encounter (Signed)
PDMP okay, Rx sent 

## 2021-05-28 ENCOUNTER — Ambulatory Visit: Payer: Medicare Other | Admitting: Internal Medicine

## 2021-05-29 ENCOUNTER — Telehealth: Payer: Self-pay | Admitting: Internal Medicine

## 2021-05-29 NOTE — Telephone Encounter (Signed)
Left message for patient to call back and schedule Medicare Annual Wellness Visit (AWV) in office.   If not able to come in office, please offer to do virtually or by telephone.  Left office number and my jabber #[phone].  Last AWV:05/08/2016  Please schedule at anytime with Nurse Health Advisor.   

## 2021-06-10 ENCOUNTER — Ambulatory Visit: Payer: Medicare Other | Admitting: Internal Medicine

## 2021-06-10 ENCOUNTER — Encounter: Payer: Self-pay | Admitting: Internal Medicine

## 2021-06-10 VITALS — BP 136/86 | HR 62 | Temp 98.0°F | Resp 16 | Ht 69.0 in | Wt 201.2 lb

## 2021-06-10 DIAGNOSIS — I10 Essential (primary) hypertension: Secondary | ICD-10-CM

## 2021-06-10 DIAGNOSIS — F419 Anxiety disorder, unspecified: Secondary | ICD-10-CM | POA: Diagnosis not present

## 2021-06-10 DIAGNOSIS — I350 Nonrheumatic aortic (valve) stenosis: Secondary | ICD-10-CM

## 2021-06-10 DIAGNOSIS — Z79899 Other long term (current) drug therapy: Secondary | ICD-10-CM | POA: Diagnosis not present

## 2021-06-10 DIAGNOSIS — F32A Depression, unspecified: Secondary | ICD-10-CM

## 2021-06-10 NOTE — Assessment & Plan Note (Signed)
HTN: BP today is very good, at home is in the 120s, continue losartan, amlodipine, check a CMP and CBC.  Further advised with results. ?Hyperlipidemia: Last LDL satisfactory, recheck FLP on RTC.  Continue atorvastatin. ?Anxiety, depression, insomnia: On citalopram, Xanax, symptoms controlled, check UDS. ?Aortic stenosis: ?Saw cardiology last month, was recommended next echo 04-2022. ?PAD: Per Dr. Smith Robert in [place] ?Preventive care: Could benefit from a COVID-vaccine, declines. ?RTC 3 to 4 months CPX. ?

## 2021-06-10 NOTE — Patient Instructions (Signed)
Check the  blood pressure regularly ?BP GOAL is between 110/65 and  135/85. ?If it is consistently higher or lower, let me know ? ? ? ?  ? ?GO TO THE LAB : Get the blood work   ? ? ?GO TO THE FRONT DESK, PLEASE SCHEDULE YOUR APPOINTMENTS ?Come back for for a physical exam in 3 to 4 months ?

## 2021-06-10 NOTE — Progress Notes (Signed)
? ?Subjective:  ? ? Patient ID: Gene PitchWilliam W Roanhorse [name], male    DOB: 03-Nov-1940, 81 y.o.   MRN: [id number] ? ?DOS:  06/10/2021 ?Type of visit - description: Follow-up ? ?Since the last office visit he is feeling well. ?Good med compliance. ?Cardiology note reviewed. ?Reports ambulatory BPs are in the 120s. ? ?Denies any chest pain or difficulty breathing ? ?Review of Systems ?See above  ? ?Past Medical History:  ?Diagnosis Date  ? Anemia   ? "just in 2016 when I was bleeding internally" (08/28/2015)  ? Anxiety   ? Aortic stenosis   ? Arthritis   ? Cervical spondylolysis   ? Depression   ? Diabetic ulcer of left foot (HCC) 08/28/2015  ? pt states he does not have diabetes  ? Family history of anesthesia complication   ? "father would start seeing things"  ? Heart murmur   ? "related to the aortic stenosis" (08/28/2015)  ? History of alcohol abuse   ? History of blood transfusion   ? "just in 2016 when I was bleeding internally" (08/28/2015)  ? Hyperlipidemia   ? high TG  ? Hypertension   ? Lesion of ulnar nerve 07/22/2012  ? Bilateral ulnar neuropathies  ? Neuropathy   ? 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  ? left  ? PVD (peripheral vascular disease) (HCC)   ? PVD, mild saw Dr Allyson SabalBerry 2012, intolerant to pletal, Rx observation  ? Ulcer of esophagus 2016  ? "caused by positioning related to foot wound"  ? ? ?Past Surgical History:  ?Procedure Laterality Date  ? AMPUTATION Left 11/20/13 and 11/24/13  ? middle  ? AMPUTATION Left 01/03/2014  ? Procedure: Left Transmetatarsal Amputation;  Surgeon: Nadara MustardMarcus Duda V, MD;  [facility];  Service: Orthopedics;  Laterality: Left;  ? AMPUTATION Right 07/27/2014  ? Procedure: Right 4th Ray Amputation;  Surgeon: Nadara MustardMarcus Duda V, MD;  [facility];  Service: Orthopedics;  Laterality: Right;  ? AMPUTATION Right 09/12/2014  ? Procedure: RIGHT TRANSMETATARSAL AMPUTATION;  Surgeon: Nadara MustardMarcus Duda V, MD;  [facility];  Service: Orthopedics;  Laterality: Right;  ? AMPUTATION Right 09/28/2014   ? Procedure: Right Below Knee Amputation;  Surgeon: Nadara MustardMarcus Duda V, MD;  [facility];  Service: Orthopedics;  Laterality: Right;  ? CATARACT EXTRACTION W/ INTRAOCULAR LENS  IMPLANT, BILATERAL Bilateral 05/2015  ? ESOPHAGOGASTRODUODENOSCOPY N/A 08/16/2014  ? Procedure: ESOPHAGOGASTRODUODENOSCOPY (EGD);  Surgeon: Vida RiggerMarc Magod, MD;  [facility];  Service: Endoscopy;  Laterality: N/A;  ? ESOPHAGOGASTRODUODENOSCOPY (EGD) WITH PROPOFOL N/A 08/17/2014  ? Procedure: ESOPHAGOGASTRODUODENOSCOPY (EGD) WITH PROPOFOL;  Surgeon: Graylin ShiverSalem F Ganem, MD;  [facility];  Service: Endoscopy;  Laterality: N/A;  ? LAPAROSCOPIC CHOLECYSTECTOMY    ? TONSILLECTOMY    ? TRANSMETATARSAL AMPUTATION Left 10/2013  ? ? ?Current Outpatient Medications  ?Medication Instructions  ? acetaminophen (TYLENOL) 650 mg, Oral, Every 6 hours PRN, Reported on 08/27/2015  ? ALPRAZolam (XANAX) 0.5 MG tablet TAKE 1 TABLET(0.5 MG) BY MOUTH THREE TIMES DAILY AS NEEDED FOR ANXIETY  ? amLODipine (NORVASC) 5 mg, Oral, Daily  ? atorvastatin (LIPITOR) 40 mg, Oral, Daily at bedtime  ? citalopram (CELEXA) 40 MG tablet TAKE 1 TABLET(40 MG) BY MOUTH DAILY  ? clopidogrel (PLAVIX) 75 mg, Oral, Daily  ? cycloSPORINE (RESTASIS) 0.05 % ophthalmic emulsion 2 drops, Both Eyes, Daily  ? fenofibrate 160 MG tablet TAKE 1 TABLET(160 MG) BY MOUTH DAILY  ? losartan (COZAAR) 100 mg, Oral, Daily  ? Multiple Vitamin (MULTIVITAMIN  WITH MINERALS) TABS tablet 1 tablet, Oral, Daily  ? mupirocin ointment (BACTROBAN) 2 % 1 application., Topical, 2 times daily  ? pregabalin (LYRICA) 150 mg, Oral, 2 times daily, 150mg  total (takes two 75mg  twice daily)  ? Propylene Glycol (SYSTANE BALANCE OP) 1 drop, Ophthalmic, 2 times daily PRN, Reported on 08/27/2015  ? Psyllium (METAMUCIL PO) 5 capsules, Oral, Daily  ? pyridOXINE (VITAMIN B-6) 100 mg, Oral, Daily  ? RA Aspirin EC 81 mg, Oral, Daily  ? thiamine 50 mg, Oral, Daily  ? traZODone (DESYREL) 100 mg, Oral, Daily at bedtime  ? vitamin  B-12 (CYANOCOBALAMIN) 2,000 mcg, Oral, Daily  ? vitamin C 3,000 mg, Oral, Daily  ? ? ?   ?Objective:  ? Physical Exam ?BP 136/86 (BP Location: Left Arm, Patient Position: Sitting, Cuff Size: Normal)   Pulse 62   Temp 98 ?F (36.7 ?C) (Oral)   Resp 16   Ht 5\' 9"  (1.753 m)   Wt 201 lb 4 oz (91.3 kg)   SpO2 96%   BMI 29.72 kg/m?  ?General:   ?Well developed, NAD, BMI noted. ?HEENT:  ?Normocephalic . Face symmetric, atraumatic ?Lungs:  ?CTA B ?Normal respiratory effort, no intercostal retractions, no accessory muscle use. ?Heart: RRR, + systolic murmur.  ?Skin: Not pale. Not jaundice ?Neurologic:  ?alert & oriented X3.  ?Speech normal, gait assisted with previous amputations, uses a cane ?Psych--  ?Cognition and judgment appear intact.  ?Cooperative with normal attention span and concentration.  ?Behavior appropriate. ?No anxious or depressed appearing.  ? ?   ?Assessment   ? ? Assessement  ?Hyperglycemia,  (A1c 5.8  2013) ?HTN ?Hyperlipidemia ?Neuropathy , peripheral:  ?d/t ETOH (per neuro note, Dr Jannifer Franklin,  2014) NCS 05-2011: Moderate neuropathy, low-grade right L5 radiculopathy ?Bilateral ulnar neuropathy ?Anxiety, depression , insomnia onset 1995 after a divorce ?CV: ?--Aortic stenosis, Dr. Angelena Form: Mild per echocardiogram 12-2014, has a soft murmur ?--PVD, Dr. Janese Banks ?---Carotid Artery  Dz: [place] at [facility] 12-2015, per notes, no significant dz, rx CV RF control ?--- Stress test at [facility] 02/2016 (-) ?MSK: ?--Osteomyelitis ?--Multiple toes amputations 2014 ?--Left transmetatarsal amputation 01/03/2014 (osteomyelitis) ?--Right transmetatarsal amputation 09/12/2014 ?-- R BKA  09/28/2014 d/  Dehiscence midfoot amputation ?-- DJD cervical spondylosis  ?GI bleed, admited 08-2014 >>> EGD  esophageal ulcer (d/t nsaids and doxycycline ?) ?H/o Alcohol abuse ?Skin ca: R ear 10/2017 ?Gets care at the [facility] and [place] as well  ?L eye blind  ?HOH ? ?PLAN ?HTN: BP today is very good, at home is in the 120s, continue losartan,  amlodipine, check a CMP and CBC.  Further advised with results. ?Hyperlipidemia: Last LDL satisfactory, recheck FLP on RTC.  Continue atorvastatin. ?Anxiety, depression, insomnia: On citalopram, Xanax, symptoms controlled, check UDS. ?Aortic stenosis: ?Saw cardiology last month, was recommended next echo 04-2022. ?PAD: Per Dr. Janese Banks in [place] ?Preventive care: Could benefit from a COVID-vaccine, declines. ?RTC 3 to 4 months CPX. ? ?This visit occurred during the SARS-CoV-2 public health emergency.  Safety protocols were in place, including screening questions prior to the visit, additional usage of staff PPE, and extensive cleaning of exam room while observing appropriate contact time as indicated for disinfecting solutions.  ? ?

## 2021-06-11 LAB — CBC WITH DIFFERENTIAL/PLATELET
Basophils Absolute: 0.1 10*3/uL (ref 0.0–0.1)
Basophils Relative: 3.8 % — ABNORMAL HIGH (ref 0.0–3.0)
Eosinophils Absolute: 0 10*3/uL (ref 0.0–0.7)
Eosinophils Relative: 1.1 % (ref 0.0–5.0)
HCT: 39.5 % (ref 39.0–52.0)
Hemoglobin: 13.2 g/dL (ref 13.0–17.0)
Lymphocytes Relative: 29 % (ref 12.0–46.0)
Lymphs Abs: 1.1 10*3/uL (ref 0.7–4.0)
MCHC: 33.4 g/dL (ref 30.0–36.0)
MCV: 93.5 fl (ref 78.0–100.0)
Monocytes Absolute: 0.5 10*3/uL (ref 0.1–1.0)
Monocytes Relative: 13.8 % — ABNORMAL HIGH (ref 3.0–12.0)
Neutro Abs: 1.9 10*3/uL (ref 1.4–7.7)
Neutrophils Relative %: 52.3 % (ref 43.0–77.0)
Platelets: 164 10*3/uL (ref 150.0–400.0)
RBC: 4.23 Mil/uL (ref 4.22–5.81)
RDW: 13.9 % (ref 11.5–15.5)
WBC: 3.7 10*3/uL — ABNORMAL LOW (ref 4.0–10.5)

## 2021-06-11 LAB — COMPREHENSIVE METABOLIC PANEL
ALT: 11 U/L (ref 0–53)
AST: 20 U/L (ref 0–37)
Albumin: 4.3 g/dL (ref 3.5–5.2)
Alkaline Phosphatase: 35 U/L — ABNORMAL LOW (ref 39–117)
BUN: 22 mg/dL (ref 6–23)
CO2: 27 mEq/L (ref 19–32)
Calcium: 9.3 mg/dL (ref 8.4–10.5)
Chloride: 104 mEq/L (ref 96–112)
Creatinine, Ser: 1.08 mg/dL (ref 0.40–1.50)
GFR: 65.15 mL/min (ref >60.00)
Glucose, Bld: 87 mg/dL (ref 70–99)
Potassium: 3.9 mEq/L (ref 3.5–5.1)
Sodium: 138 mEq/L (ref 135–145)
Total Bilirubin: 0.8 mg/dL (ref 0.2–1.2)
Total Protein: 6 g/dL (ref 6.0–8.3)

## 2021-06-15 LAB — DRUG MONITORING PANEL 375977 , URINE
Alcohol Metabolites: POSITIVE ng/mL — AB (ref <500)
Alphahydroxyalprazolam: 342 ng/mL — ABNORMAL HIGH (ref <25)
Alphahydroxymidazolam: NEGATIVE ng/mL (ref <50)
Alphahydroxytriazolam: NEGATIVE ng/mL (ref <50)
Aminoclonazepam: NEGATIVE ng/mL (ref <25)
Amphetamines: NEGATIVE ng/mL (ref <500)
Barbiturates: NEGATIVE ng/mL (ref <300)
Benzodiazepines: POSITIVE ng/mL — AB (ref <100)
Cocaine Metabolite: NEGATIVE ng/mL (ref <150)
Desmethyltramadol: NEGATIVE ng/mL (ref <100)
Ethyl Glucuronide (ETG): 6884 ng/mL — ABNORMAL HIGH (ref <500)
Ethyl Sulfate (ETS): 3859 ng/mL — ABNORMAL HIGH (ref <100)
Hydroxyethylflurazepam: NEGATIVE ng/mL (ref <50)
Lorazepam: NEGATIVE ng/mL (ref <50)
Marijuana Metabolite: NEGATIVE ng/mL (ref <20)
Nordiazepam: NEGATIVE ng/mL (ref <50)
Opiates: NEGATIVE ng/mL (ref <100)
Oxazepam: NEGATIVE ng/mL (ref <50)
Oxycodone: NEGATIVE ng/mL (ref <100)
Temazepam: NEGATIVE ng/mL (ref <50)
Tramadol: NEGATIVE ng/mL (ref <100)

## 2021-06-15 LAB — DM TEMPLATE

## 2021-07-16 ENCOUNTER — Telehealth: Payer: Self-pay | Admitting: Internal Medicine

## 2021-07-16 NOTE — Telephone Encounter (Signed)
Left message for patient to call back and schedule Medicare Annual Wellness Visit (AWV) .  ? ?Please offer to do virtually or by telephone.  Left office number and my jabber [phone]. ? ?Last AWV:05/08/2016 ? ?Please schedule at anytime with Nurse Health Advisor. ?  ?

## 2021-07-17 ENCOUNTER — Telehealth: Payer: Self-pay | Admitting: Internal Medicine

## 2021-07-18 NOTE — Telephone Encounter (Signed)
PDMP okay, Rx sent 

## 2021-07-18 NOTE — Telephone Encounter (Signed)
Requesting: alprazolam 0.5mg  ?Contract: 04/23/20 ?UDS: 06/10/21 ?Last Visit: 06/10/21 ?Next Visit: 10/13/21 ?Last Refill: 05/19/21 #90 and 1RF ? ?Please Advise ? ?

## 2021-08-09 ENCOUNTER — Other Ambulatory Visit: Payer: Self-pay | Admitting: Internal Medicine

## 2021-09-09 ENCOUNTER — Other Ambulatory Visit: Payer: Self-pay | Admitting: Internal Medicine

## 2021-09-11 ENCOUNTER — Encounter: Payer: Self-pay | Admitting: Internal Medicine

## 2021-10-13 ENCOUNTER — Encounter: Payer: Medicare Other | Admitting: Internal Medicine

## 2021-10-14 ENCOUNTER — Ambulatory Visit (INDEPENDENT_AMBULATORY_CARE_PROVIDER_SITE_OTHER): Payer: Medicare Other | Admitting: Internal Medicine

## 2021-10-14 ENCOUNTER — Encounter: Payer: Self-pay | Admitting: Internal Medicine

## 2021-10-14 VITALS — BP 118/64 | HR 63 | Temp 98.4°F | Resp 18 | Ht 69.0 in | Wt 199.1 lb

## 2021-10-14 DIAGNOSIS — R739 Hyperglycemia, unspecified: Secondary | ICD-10-CM

## 2021-10-14 DIAGNOSIS — E785 Hyperlipidemia, unspecified: Secondary | ICD-10-CM

## 2021-10-14 DIAGNOSIS — I10 Essential (primary) hypertension: Secondary | ICD-10-CM | POA: Diagnosis not present

## 2021-10-14 DIAGNOSIS — Z0000 Encounter for general adult medical examination without abnormal findings: Secondary | ICD-10-CM

## 2021-10-14 DIAGNOSIS — Z125 Encounter for screening for malignant neoplasm of prostate: Secondary | ICD-10-CM

## 2021-10-14 MED ORDER — ALPRAZOLAM 0.5 MG PO TABS: 0.5000 mg | ORAL_TABLET | Freq: Three times a day (TID) | ORAL | 2 refills | Status: DC | PRN

## 2021-10-14 NOTE — Patient Instructions (Addendum)
Recommend to proceed with the following vaccines at your pharmacy:  Covid booster (bivalent) Flu shot this fall   Check the  blood pressure regularly BP GOAL is between 110/65 and  135/85. If it is consistently higher or lower, let me know     GO TO THE LAB : Get the blood work     GO TO THE FRONT DESK, PLEASE SCHEDULE YOUR APPOINTMENTS Come back for   a checkup in 6 months    "Living will", "Health Care Power of attorney": Advanced care planning  (If you already have a living will or healthcare power of attorney, please bring the copy to be scanned in your chart.)  Advance care planning is a process that supports adults in  understanding and sharing their preferences regarding future medical care.   The patient's preferences are recorded in documents called Advance Directives.    Advanced directives are completed (and can be modified at any time) while the patient is in full mental capacity.   The documentation should be available at all times to the patient, the family and the healthcare providers.  Bring in a copy to be scanned in your chart is an excellent idea and is recommended   This legal documents direct treatment decision making and/or appoint a surrogate to make the decision if the patient is not capable to do so.    Advance directives can be documented in many types of formats,  documents have names such as:  Lliving will  Durable power of attorney for healthcare (healthcare proxy or healthcare power of attorney)  Combined directives  Physician orders for life-sustaining treatment    More information at:  [email or web address]

## 2021-10-14 NOTE — Progress Notes (Unsigned)
Subjective:    Patient ID: Gene [name], male    DOB: 05/18/1940, 81 y.o.   MRN: [id number]  DOS:  10/14/2021 Type of visit - description: here for CPX  Here for CPX. In general feels well and has no major or new concerns.   Review of Systems See above   Past Medical History:  Diagnosis Date   Anemia    "just in 2016 when I was bleeding internally" (08/28/2015)   Anxiety    Aortic stenosis    Arthritis    Cervical spondylolysis    Depression    Diabetic ulcer of left foot (HCC) 08/28/2015   pt states he does not have diabetes   Family history of anesthesia complication    "father would start seeing things"   Heart murmur    "related to the aortic stenosis" (08/28/2015)   History of alcohol abuse    History of blood transfusion    "just in 2016 when I was bleeding internally" (08/28/2015)   Hyperlipidemia    high TG   Hypertension    Lesion of ulnar nerve 07/22/2012   Bilateral ulnar neuropathies   Neuropathy    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   left   PVD (peripheral vascular disease) (HCC)    PVD, mild saw Dr Allyson Sabal 2012, intolerant to pletal, Rx observation   Ulcer of esophagus 2016   "caused by positioning related to foot wound"    Past Surgical History:  Procedure Laterality Date   AMPUTATION Left 11/20/13 and 11/24/13   middle   AMPUTATION Left 01/03/2014   Procedure: Left Transmetatarsal Amputation;  Surgeon: Nadara Mustard, MD;  Location: [facility] OR;  Service: Orthopedics;  Laterality: Left;   AMPUTATION Right 07/27/2014   Procedure: Right 4th Ray Amputation;  Surgeon: Nadara Mustard, MD;  Location: [facility] OR;  Service: Orthopedics;  Laterality: Right;   AMPUTATION Right 09/12/2014   Procedure: RIGHT TRANSMETATARSAL AMPUTATION;  Surgeon: Nadara Mustard, MD;  Location: [facility] OR;  Service: Orthopedics;  Laterality: Right;   AMPUTATION Right 09/28/2014   Procedure: Right Below Knee Amputation;  Surgeon: Nadara Mustard, MD;  Location[facility] OR;  Service: Orthopedics;   Laterality: Right;   CATARACT EXTRACTION W/ INTRAOCULAR LENS  IMPLANT, BILATERAL Bilateral 05/2015   ESOPHAGOGASTRODUODENOSCOPY N/A 08/16/2014   Procedure: ESOPHAGOGASTRODUODENOSCOPY (EGD);  Surgeon: Vida Rigger, MD;  Location: [facility] ENDOSCOPY;  Service: Endoscopy;  Laterality: N/A;   ESOPHAGOGASTRODUODENOSCOPY (EGD) WITH PROPOFOL N/A 08/17/2014   Procedure: ESOPHAGOGASTRODUODENOSCOPY (EGD) WITH PROPOFOL;  Surgeon: Graylin Shiver, MD;  Location: [facility] ENDOSCOPY;  Service: Endoscopy;  Laterality: N/A;   LAPAROSCOPIC CHOLECYSTECTOMY     TONSILLECTOMY     TRANSMETATARSAL AMPUTATION Left 10/2013    Current Outpatient Medications  Medication Instructions   acetaminophen (TYLENOL) 650 mg, Oral, Every 6 hours PRN, Reported on 08/27/2015   ALPRAZolam (XANAX) 0.5 MG tablet TAKE 1 TABLET(0.5 MG) BY MOUTH THREE TIMES DAILY AS NEEDED FOR ANXIETY   amLODipine (NORVASC) 5 mg, Oral, Daily   atorvastatin (LIPITOR) 40 mg, Oral, Daily at bedtime   citalopram (CELEXA) 40 MG tablet TAKE 1 TABLET(40 MG) BY MOUTH DAILY   clopidogrel (PLAVIX) 75 mg, Oral, Daily   cyanocobalamin (VITAMIN B12) 2,000 mcg, Oral, Daily   cycloSPORINE (RESTASIS) 0.05 % ophthalmic emulsion 2 drops, Both Eyes, Daily   fenofibrate 160 MG tablet TAKE 1 TABLET(160 MG) BY MOUTH DAILY   losartan (COZAAR) 100 mg, Oral, Daily   Multiple Vitamin (MULTIVITAMIN WITH MINERALS) TABS tablet  1 tablet, Oral, Daily   mupirocin ointment (BACTROBAN) 2 % 1 application , Topical, 2 times daily   pregabalin (LYRICA) 150 mg, Oral, 2 times daily, 150mg  total (takes two 75mg  twice daily)   Propylene Glycol (SYSTANE BALANCE OP) 1 drop, Ophthalmic, 2 times daily PRN, Reported on 08/27/2015   Psyllium (METAMUCIL PO) 5 capsules, Oral, Daily   pyridOXINE (VITAMIN B6) 100 mg, Oral, Daily   RA Aspirin EC 81 mg, Oral, Daily   thiamine 50 mg, Oral, Daily   traZODone (DESYREL) 100 mg, Oral, Daily at bedtime   vitamin C 3,000 mg, Oral, Daily       Objective:   Physical  Exam BP 118/64   Pulse 63   Temp 98.4 F (36.9 C) (Oral)   Resp 18   Ht 5\' 9"  (1.753 m)   Wt 199 lb 2 oz (90.3 kg)   SpO2 94%   BMI 29.41 kg/m  General: Well developed, NAD, BMI noted Neck: No  thyromegaly  HEENT:  Normocephalic . Face symmetric, atraumatic Lungs:  CTA B Normal respiratory effort, no intercostal retractions, no accessory muscle use. Heart: RRR, soft systolic murmur.  Abdomen:  Not distended, soft, non-tender. No rebound or rigidity.   Lower extremities: Consistent with previous amputations Skin: Exposed areas without rash. Not pale. Not jaundice Neurologic:  alert & oriented X3.  Speech normal, gait appropriate for age and consistent with previous amputation strength symmetric and appropriate for age.  Psych: Cognition and judgment appear intact.  Cooperative with normal attention span and concentration.  Behavior appropriate. No anxious or depressed appearing.     Assessment     Assessement  Hyperglycemia,  (A1c 5.8  2013) HTN Hyperlipidemia Neuropathy , peripheral:  d/t ETOH (per neuro note, Dr 08/29/2015,  2014) NCS 05-2011: Moderate neuropathy, low-grade right L5 radiculopathy Bilateral ulnar neuropathy Anxiety, depression , insomnia onset 1995 after a divorce CV: --Aortic stenosis, Dr. Anne Hahn: Mild per echocardiogram 12-2014, has a soft murmur --PVD, Dr. 04-26-1994 ---Carotid Artery  Dz: Clifton James at [facility] 12-2015, per notes, no significant dz, rx CV RF control --- Stress test at 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 and [facility] as well  L eye blind  [facility]  PLAN Here for  CPX: Hyperglycemia: Check A1c. Hyperlipidemia: On atorvastatin, fenofibrate.  Check a lipid panel. Anxiety, depression, insomnia: On citalopram, trazodone, Xanax refill, PDMP was okay.  Symptoms controlled. Cardiovascular: Denies chest pain or difficulty breathing.  Continue aspirin, Plavix, RF control. RTC 6 months -Td 2017 - pnm 23: 2011 and 2020; prevnar 2016 - zostavax ~2012, s/p shingrix - Covid vax  booster benefits d/w pt, he is somewhat reluctant. -Rec flu shot q year ---several Cscopes, see PMH, last  05-2016: No polyps, Dr 2017;  no further screens based on age , pt in agreement  --- Sees urology regularly, request PSA, will do --- Labs reviewed: Lipid panel A1c TSH PSA - diet d/w pt   -Information about healthcare POA provided     HTN: BP today is very good, at home is in the 120s, continue losartan, amlodipine, check a CMP and CBC.  Further advised with results. Hyperlipidemia: Last LDL satisfactory, recheck FLP on RTC.  Continue atorvastatin. Anxiety, depression, insomnia: On citalopram, Xanax, symptoms controlled, check UDS. Aortic stenosis: Saw cardiology last month, was recommended next echo 04-2022. PAD: Per Dr. Smith Robert in [place] Preventive care: Could benefit from a COVID-vaccine, declines. RTC 3 to 4 months CPX.  This visit occurred during the SARS-CoV-2 public health emergency.  S

## 2021-10-15 ENCOUNTER — Encounter: Payer: Self-pay | Admitting: Internal Medicine

## 2021-10-15 LAB — LIPID PANEL
Cholesterol: 149 mg/dL (ref 0–200)
HDL: 54.8 mg/dL (ref >39.00)
LDL Cholesterol: 75 mg/dL (ref 0–99)
NonHDL: 94.16
Total CHOL/HDL Ratio: 3
Triglycerides: 98 mg/dL (ref 0.0–149.0)
VLDL: 19.6 mg/dL (ref 0.0–40.0)

## 2021-10-15 LAB — TSH: TSH: 1.82 u[IU]/mL (ref 0.35–5.50)

## 2021-10-15 LAB — PSA: PSA: 1.14 ng/mL (ref 0.10–4.00)

## 2021-10-15 LAB — HEMOGLOBIN A1C: Hgb A1c MFr Bld: 5.6 % (ref 4.6–6.5)

## 2021-10-15 NOTE — Assessment & Plan Note (Signed)
-  Td 2017 - pnm 23: 2011 and 2020; prevnar 2016 - zostavax ~2012, s/p shingrix - Covid vax  booster benefits d/w pt, he is somewhat reluctant to proceed. -Rec flu shot q year -- Several Cscopes, see PMH, last  05-2016: No polyps, Dr Ewing Schlein;  no further screens based on age , pt in agreement  --- Sees urology regularly, request PSA, will do --- Labs reviewed: Lipid panel A1c TSH PSA - diet d/w pt   -Information about healthcare POA provided

## 2021-10-15 NOTE — Assessment & Plan Note (Signed)
Here for CPX: Hyperglycemia: Check A1c. Hyperlipidemia: On atorvastatin, fenofibrate.  Check a lipid panel. Anxiety, depression, insomnia: On citalopram, trazodone, Xanax refill, PDMP was okay.  Symptoms controlled. Cardiovascular: Denies chest pain or difficulty breathing.  Continue aspirin, Plavix, RF control. RTC 6 months

## 2022-01-12 ENCOUNTER — Telehealth: Payer: Self-pay | Admitting: Internal Medicine

## 2022-01-12 NOTE — Telephone Encounter (Signed)
Requesting: alprazolam 0.5mg   Contract: 04/23/20 UDS: 06/10/21 Last Visit: 10/14/21 Next Visit: 04/21/22 Last Refill: 10/14/21 #90 and 2RF  Pt sig: 1 tab tid prn  Please Advise

## 2022-01-12 NOTE — Telephone Encounter (Signed)
PDMP okay,Prescription sent

## 2022-02-03 ENCOUNTER — Other Ambulatory Visit: Payer: Self-pay | Admitting: Internal Medicine

## 2022-03-03 ENCOUNTER — Other Ambulatory Visit: Payer: Self-pay | Admitting: Internal Medicine

## 2022-04-13 ENCOUNTER — Ambulatory Visit (HOSPITAL_COMMUNITY): Payer: Medicare Other | Attending: Cardiovascular Disease

## 2022-04-13 DIAGNOSIS — I350 Nonrheumatic aortic (valve) stenosis: Secondary | ICD-10-CM | POA: Diagnosis present

## 2022-04-13 LAB — ECHOCARDIOGRAM COMPLETE
AR max vel: 1.26 cm2
AV Area VTI: 1.27 cm2
AV Area mean vel: 1.37 cm2
AV Mean grad: 15 mmHg
AV Peak grad: 31 mmHg
Ao pk vel: 2.79 m/s
Area-P 1/2: 3.33 cm2
P 1/2 time: 416 msec
S' Lateral: 2.2 cm

## 2022-04-20 ENCOUNTER — Encounter: Payer: Self-pay | Admitting: Cardiovascular Disease

## 2022-04-21 ENCOUNTER — Ambulatory Visit: Payer: Medicare Other | Admitting: Internal Medicine

## 2022-04-23 ENCOUNTER — Encounter: Payer: Self-pay | Admitting: Cardiovascular Disease

## 2022-04-23 ENCOUNTER — Ambulatory Visit: Payer: Medicare Other | Attending: Cardiovascular Disease | Admitting: Cardiovascular Disease

## 2022-04-23 VITALS — BP 130/65 | HR 77 | Ht 69.0 in | Wt 204.4 lb

## 2022-04-23 DIAGNOSIS — I10 Essential (primary) hypertension: Secondary | ICD-10-CM | POA: Diagnosis not present

## 2022-04-23 DIAGNOSIS — I350 Nonrheumatic aortic (valve) stenosis: Secondary | ICD-10-CM

## 2022-04-23 DIAGNOSIS — E7800 Pure hypercholesterolemia, unspecified: Secondary | ICD-10-CM | POA: Diagnosis not present

## 2022-04-23 NOTE — Progress Notes (Signed)
Chief Complaint  Patient presents with   Follow-up    Aortic stenosis   History of Present Illness: 82 yo male with history of PAD, HTN, hyperlipidemia and aortic stenosis who is here today for follow up. I saw him as a new patient for the evaluation of his aortic stenosis in January 2022. 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 He had been followed by Dr. Janese Banks in [place], [place] for his cardiac disease and PAD. He is known to have mild aortic stenosis by echo December 2019 at [facility]. Echo December 2019 with [id number], grade 1 diastolic dysfunction. Mild AS with mean gradient 14 mmHg, mild AI. Normal stress test in December 2017. He asked to have his cardiac issues followed here in [place] and he has continued to follow with Dr. Janese Banks for his PAD.  Echo here in February 2022 with LVEF=60-65%. Mild aortic stenosis. Most recent echo in February 2024 with LVEF=60-65%. Mild to moderate AS with mean gradient 15 mmHg.   He is here today for follow up. The patient denies any chest pain, dyspnea, palpitations, lower extremity edema, orthopnea, PND, dizziness, near syncope or syncope.   Primary Care Physician: Colon Branch, MD  Past Medical History:  Diagnosis Date   Anemia    "just in 2016 when I was bleeding internally" (08/28/2015)   Anxiety    Aortic stenosis    Arthritis    Cervical spondylolysis    Depression    Diabetic ulcer of left foot ([place]) 08/28/2015   pt states he does not have diabetes   Family history of anesthesia complication    "father would start seeing things"   Heart murmur    "related to the aortic stenosis" (08/28/2015)   History of alcohol abuse    History of blood transfusion    "just in 2016 when I was bleeding internally" (08/28/2015)   Hyperlipidemia    high TG   Hypertension    Lesion of ulnar nerve 07/22/2012   Bilateral ulnar neuropathies   Neuropathy    Osteomyelitis of ankle and foot ([place]) 10/2013   left   PVD (peripheral  vascular disease) ([place])    PVD, mild saw Dr Gwenlyn Found 2012, intolerant to pletal, Rx observation   Ulcer of esophagus 2016   "caused by positioning related to foot wound"    Past Surgical History:  Procedure Laterality Date   AMPUTATION Left 11/20/13 and 11/24/13   middle   AMPUTATION Left 01/03/2014   Procedure: Left Transmetatarsal Amputation;  Surgeon: Newt Minion, MD;  Location: [place];  Service: Orthopedics;  Laterality: Left;   AMPUTATION Right 07/27/2014   Procedure: Right 4th Ray Amputation;  Surgeon: Newt Minion, MD;  Location: [place];  Service: Orthopedics;  Laterality: Right;   AMPUTATION Right 09/12/2014   Procedure: RIGHT TRANSMETATARSAL AMPUTATION;  Surgeon: Newt Minion, MD;  Location: [place];  Service: Orthopedics;  Laterality: Right;   AMPUTATION Right 09/28/2014   Procedure: Right Below Knee Amputation;  Surgeon: Newt Minion, MD;  Location: [place];  Service: Orthopedics;  Laterality: Right;   CATARACT EXTRACTION W/ INTRAOCULAR LENS  IMPLANT, BILATERAL Bilateral 05/2015   ESOPHAGOGASTRODUODENOSCOPY N/A 08/16/2014   Procedure: ESOPHAGOGASTRODUODENOSCOPY (EGD);  Surgeon: Clarene Essex, MD;  Location: [facility] ENDOSCOPY;  Service: Endoscopy;  Laterality: N/A;   ESOPHAGOGASTRODUODENOSCOPY (EGD) WITH PROPOFOL N/A 08/17/2014   Procedure: ESOPHAGOGASTRODUODENOSCOPY (EGD) WITH PROPOFOL;  Surgeon: Wonda Horner, MD;  Location: [facility] ENDOSCOPY;  Service: Endoscopy;  Laterality: N/A;   LAPAROSCOPIC CHOLECYSTECTOMY     TONSILLECTOMY     TRANSMETATARSAL AMPUTATION Left 10/2013    Current Outpatient Medications  Medication Sig Dispense Refill   acetaminophen (TYLENOL) 325 MG tablet Take 650 mg by mouth every 6 (six) hours as needed (pain). Reported on 08/27/2015     ALPRAZolam (XANAX) 0.5 MG tablet TAKE 1 TABLET(0.5 MG) BY MOUTH THREE TIMES DAILY AS NEEDED FOR ANXIETY 90 tablet 5   amLODipine (NORVASC) 5 MG tablet Take 1 tablet (5 mg total) daily by mouth. 30 tablet 5   Ascorbic Acid (VITAMIN C) 1000 MG  tablet Take 3,000 mg by mouth daily.     atorvastatin (LIPITOR) 40 MG tablet Take 1 tablet (40 mg total) at bedtime by mouth. 30 tablet 5   citalopram (CELEXA) 40 MG tablet Take 1 tablet (40 mg total) by mouth daily. 90 tablet 1   clopidogrel (PLAVIX) 75 MG tablet Take 75 mg by mouth daily.     cycloSPORINE (RESTASIS) 0.05 % ophthalmic emulsion Place 2 drops into both eyes daily.     fenofibrate 160 MG tablet Take 1 tablet (160 mg total) by mouth daily. 90 tablet 1   losartan (COZAAR) 100 MG tablet Take 1 tablet (100 mg total) by mouth daily. 90 tablet 1   Multiple Vitamin (MULTIVITAMIN WITH MINERALS) TABS tablet Take 1 tablet by mouth daily.     mupirocin ointment (BACTROBAN) 2 % Apply 1 application topically 2 (two) times daily. 22 g 0   pregabalin (LYRICA) 150 MG capsule Take 150 mg by mouth 2 (two) times daily. 122m total (takes two 742mtwice daily)     Propylene Glycol (SYSTANE BALANCE OP) Apply 1 drop to eye 2 (two) times daily as needed (dry eyes). Reported on 08/27/2015     Psyllium (METAMUCIL PO) Take 5 capsules by mouth daily.      pyridOXINE (VITAMIN B-6) 100 MG tablet Take 100 mg by mouth daily.     RA ASPIRIN EC 81 MG EC tablet Take 81 mg by mouth daily.  0   thiamine 50 MG tablet Take 50 mg by mouth daily.     traZODone (DESYREL) 100 MG tablet Take 100 mg by mouth at bedtime.     vitamin B-12 (CYANOCOBALAMIN) 1000 MCG tablet Take 2,000 mcg by mouth daily.      No current facility-administered medications for this visit.    No Known Allergies  Social History   Socioeconomic History   Marital status: Divorced    Spouse name: Not on file   Number of children: 3   Years of education: College   Highest education level: Not on file  Occupational History   Occupation: retired  Tobacco Use   Smoking status: Never   Smokeless tobacco: Never  Vaping Use   Vaping Use: Never used  Substance and Sexual Activity   Alcohol use: Yes    Comment: 2 beers most days   Drug use: No    Sexual activity: Not Currently  Other Topics Concern   Not on file  Social History Narrative    Divorced since 1994; lives by himself, 3 sons, 1 GD  (all [place]     Social Determinants of Health   Financial Resource Strain: Not on file  Food Insecurity: Not on file  Transportation Needs: Not on file  Physical Activity: Not on file  Stress: Not on file  Social Connections: Not on file  Intimate  Partner Violence: Not on file    Family History  Problem Relation Age of Onset   Hyperlipidemia Mother        alive, 73 y/o   Lung cancer Father        asbestos releated   Colon polyps Father    Diabetes Neg Hx    Coronary artery disease Neg Hx    Prostate cancer Neg Hx    Colon cancer Neg Hx     Review of Systems:  As stated in the HPI and otherwise negative.   BP 130/65   Pulse 77   Ht 5' 9"$  (1.753 m)   Wt 92.7 kg   SpO2 97%   BMI 30.18 kg/m   Physical Examination: General: Well developed, well nourished, NAD  HEENT: OP clear, mucus membranes moist  SKIN: warm, dry. No rashes. Neuro: No focal deficits  Musculoskeletal: Muscle strength 5/5 all ext  Psychiatric: Mood and affect normal  Neck: No JVD, no carotid bruits, no thyromegaly, no lymphadenopathy.  Lungs:Clear bilaterally, no wheezes, rhonci, crackles Cardiovascular: Regular rate and rhythm. Systolic murmur.  Abdomen:Soft. Bowel sounds present. Non-tender.  Extremities: Right leg amputation with prosthetic limb in place. No edema LLE  EKG:  EKG is ordered today. The ekg ordered today demonstrates sinus, T wave abn-unchanged  Echo February 2024:   1. Left ventricular ejection fraction, by estimation, is 60 to 65%. The  left ventricle has normal function. The left ventricle has no regional  wall motion abnormalities. There is mild left ventricular hypertrophy.  Left ventricular diastolic parameters  are consistent with Grade I diastolic dysfunction (impaired  relaxation).   2. Right ventricular systolic function is normal. The right ventricular  size is normal. There is mildly elevated pulmonary artery systolic  pressure. The estimated right ventricular systolic pressure is 123XX123 mmHg.   3. The mitral valve is degenerative. No evidence of mitral valve  regurgitation. No evidence of mitral stenosis.   4. Tricuspid valve regurgitation is moderate.   5. The aortic valve is calcified. There is moderate calcification of the  aortic valve. There is moderate thickening of the aortic valve. Aortic  valve regurgitation is mild. Mild to moderate aortic valve stenosis.  Aortic regurgitation PHT measures 416  msec. Aortic valve area, by VTI measures 1.27 cm. Aortic valve mean  gradient measures 15.0 mmHg. Aortic valve Vmax measures 2.78 m/s.   6. The inferior vena cava is normal in size with greater than 50%  respiratory variability, suggesting right atrial pressure of 3 mmHg.   Recent Labs: 06/10/2021: ALT 11; BUN 22; Creatinine, Ser 1.08; Hemoglobin 13.2; Platelets 164.0; Potassium 3.9; Sodium 138 10/14/2021: TSH 1.82   Lipid Panel    Component Value Date/Time   CHOL 149 10/14/2021 1447   TRIG 98.0 10/14/2021 1447   TRIG 202 (HH) 01/07/2006 1456   HDL 54.80 10/14/2021 1447   CHOLHDL 3 10/14/2021 1447   VLDL 19.6 10/14/2021 1447   LDLCALC 75 10/14/2021 1447   LDLDIRECT 68.2 01/20/2013 1055     Wt Readings from Last 3 Encounters:  04/23/22 92.7 kg  10/14/21 90.3 kg  06/10/21 91.3 kg     Assessment and Plan:   1. Aortic stenosis: Moderate AS and mild AI by echo in February 2024. The valve appears to be moderate based on leaflet thickening and reduced leaflet excursion. No symptoms. Repeat echo February 2025   2. PAD: Followed in [place] by Dr. Janese Banks.   3. HTN: BP is controlled. No changes  4. Hyperlipidemia:  Lipids managed in primary care. Continue statin.   Labs/ tests ordered today include:   Orders Placed This Encounter  Procedures    EKG 12-Lead   ECHOCARDIOGRAM COMPLETE   Disposition:   F/U with me in one  Year.    Signed, Lauree Chandler, MD 04/23/2022 2:07 PM    [facility] [address], [place], [place]  [address] Phone: [phone]; Fax: [phone]

## 2022-04-23 NOTE — Patient Instructions (Signed)
Medication Instructions:  No changes *If you need a refill on your cardiac medications before your next appointment, please call your pharmacy*   Lab Work: none If you have labs (blood work) drawn today and your tests are completely normal, you will receive your results only by: [place] (if you have MyChart) OR A paper copy in the mail If you have any lab test that is abnormal or we need to change your treatment, we will call you to review the results.   Testing/Procedures: WILL REPEAT ECHO FEB 2025  Your physician has requested that you have an echocardiogram. Echocardiography is a painless test that uses sound waves to create images of your heart. It provides your doctor with information about the size and shape of your heart and how well your heart's chambers and valves are working. This procedure takes approximately one hour. There are no restrictions for this procedure. Please do NOT wear cologne, perfume, aftershave, or lotions (deodorant is allowed). Please arrive 15 minutes prior to your appointment time.    Follow-Up: At [facility], you and your health needs are our priority.  As part of our continuing mission to provide you with exceptional heart care, we have created designated Provider Care Teams.  These Care Teams include your primary Cardiologist (physician) and Advanced Practice Providers (APPs -  Physician Assistants and Nurse Practitioners) who all work together to provide you with the care you need, when you need it.   Your next appointment:   12 month(s) (after echocardiogram next Feb)  Provider:   Lauree Chandler, MD

## 2022-06-08 ENCOUNTER — Other Ambulatory Visit: Payer: Self-pay | Admitting: Internal Medicine

## 2022-07-07 ENCOUNTER — Telehealth: Payer: Self-pay | Admitting: Internal Medicine

## 2022-07-07 NOTE — Telephone Encounter (Signed)
Requesting: alprazolam 0.5mg   Contract: 04/23/20 UDS: 06/10/21 Last Visit: 10/14/21 Next Visit: 07/14/22 Last Refill: 01/12/22 #90 and 5RF  Please Advise

## 2022-07-07 NOTE — Telephone Encounter (Signed)
Pdmp ok, rx sent  ? ?

## 2022-07-14 ENCOUNTER — Ambulatory Visit: Payer: Medicare Other | Admitting: Internal Medicine

## 2022-07-14 ENCOUNTER — Encounter: Payer: Self-pay | Admitting: Internal Medicine

## 2022-07-14 VITALS — BP 126/84 | HR 58 | Temp 97.5°F | Resp 18 | Ht 69.0 in | Wt 202.1 lb

## 2022-07-14 DIAGNOSIS — R079 Chest pain, unspecified: Secondary | ICD-10-CM | POA: Diagnosis not present

## 2022-07-14 DIAGNOSIS — R0609 Other forms of dyspnea: Secondary | ICD-10-CM | POA: Diagnosis not present

## 2022-07-14 DIAGNOSIS — Z79899 Other long term (current) drug therapy: Secondary | ICD-10-CM

## 2022-07-14 DIAGNOSIS — I10 Essential (primary) hypertension: Secondary | ICD-10-CM

## 2022-07-14 DIAGNOSIS — R739 Hyperglycemia, unspecified: Secondary | ICD-10-CM | POA: Diagnosis not present

## 2022-07-14 DIAGNOSIS — F419 Anxiety disorder, unspecified: Secondary | ICD-10-CM

## 2022-07-14 DIAGNOSIS — E785 Hyperlipidemia, unspecified: Secondary | ICD-10-CM | POA: Diagnosis not present

## 2022-07-14 DIAGNOSIS — F32A Depression, unspecified: Secondary | ICD-10-CM

## 2022-07-14 NOTE — Patient Instructions (Addendum)
Please bring [place] a copy of your Healthcare Power of Attorney for your chart.   Check the  blood pressure regularly BP GOAL is between 110/65 and  135/85. If it is consistently higher or lower, let me know    GO TO THE LAB : Get the blood work     GO TO THE FRONT DESK, PLEASE SCHEDULE YOUR APPOINTMENTS Come back for   a checkup in 3 months     Fall Prevention in the Home, Adult Falls can cause injuries and affect people of all ages. There are many simple things that you can do to make your home safe and to help prevent falls. If you need it, ask for help making these changes. What actions can I take to prevent falls? General information Use good lighting in all rooms. Make sure to: Replace any light bulbs that burn out. Turn on lights if it is dark and use night-lights. Keep items that you use often in easy-to-reach places. Lower the shelves around your home if needed. Move furniture so that there are clear paths around it. Do not keep throw rugs or other things on the floor that can make you trip. If any of your floors are uneven, fix them. Add color or contrast paint or tape to clearly mark and help you see: Grab bars or handrails. First and last steps of staircases. Where the edge of each step is. If you use a ladder or stepladder: Make sure that it is fully opened. Do not climb a closed ladder. Make sure the sides of the ladder are locked in place. Have someone hold the ladder while you use it. Know where your pets are as you move through your home. What can I do in the bathroom?     Keep the floor dry. Clean up any water that is on the floor right away. Remove soap buildup in the bathtub or shower. Buildup makes bathtubs and showers slippery. Use non-skid mats or decals on the floor of the bathtub or shower. Attach bath mats securely with double-sided, non-slip rug tape. If you need to sit down while you are in the shower, use a non-slip stool. Install grab bars by the  toilet and in the bathtub and shower. Do not use towel bars as grab bars. What can I do in the bedroom? Make sure that you have a light by your bed that is easy to reach. Do not use any sheets or blankets on your bed that hang to the floor. Have a firm bench or chair with side arms that you can use for support when you get dressed. What can I do in the kitchen? Clean up any spills right away. If you need to reach something above you, use a sturdy step stool that has a grab bar. Keep electrical cables out of the way. Do not use floor polish or wax that makes floors slippery. What can I do with my stairs? Do not leave anything on the stairs. Make sure that you have a light switch at the top and the bottom of the stairs. Have them installed if you do not have them. Make sure that there are handrails on both sides of the stairs. Fix handrails that are broken or loose. Make sure that handrails are as long as the staircases. Install non-slip stair treads on all stairs in your home if they do not have carpet. Avoid having throw rugs at the top or bottom of stairs, or secure the rugs with carpet  tape to prevent them from moving. Choose a carpet design that does not hide the edge of steps on the stairs. Make sure that carpet is firmly attached to the stairs. Fix any carpet that is loose or worn. What can I do on the outside of my home? Use bright outdoor lighting. Repair the edges of walkways and driveways and fix any cracks. Clear paths of anything that can make you trip, such as tools or rocks. Add color or contrast paint or tape to clearly mark and help you see high doorway thresholds. Trim any bushes or trees on the main path into your home. Check that handrails are securely fastened and in good repair. Both sides of all steps should have handrails. Install guardrails along the edges of any raised decks or porches. Have leaves, snow, and ice cleared regularly. Use sand, salt, or ice melt on  walkways during winter months if you live where there is ice and snow. In the garage, clean up any spills right away, including grease or oil spills. What other actions can I take? Review your medicines with your health care provider. Some medicines can make you confused or feel dizzy. This can increase your chance of falling. Wear closed-toe shoes that fit well and support your feet. Wear shoes that have rubber soles and low heels. Use a cane, walker, scooter, or crutches that help you move around if needed. Talk with your provider about other ways that you can decrease your risk of falls. This may include seeing a physical therapist to learn to do exercises to improve movement and strength. Where to find more information Centers for Disease Control and Prevention, STEADI: TonerPromos.no [employer] on Aging: BaseRingTones.pl National Institute on Aging: BaseRingTones.pl Contact a health care provider if: You are afraid of falling at home. You feel weak, drowsy, or dizzy at home. You fall at home. Get help right away if you: Lose consciousness or have trouble moving after a fall. Have a fall that causes a head injury. These symptoms may be an emergency. Get help right away. Call 911. Do not wait to see if the symptoms will go away. Do not drive yourself to the hospital. This information is not intended to replace advice given to you by your health care provider. Make sure you discuss any questions you have with your health care provider. Document Revised: 10/27/2021 Document Reviewed: 10/27/2021 Elsevier Patient Education  2023 [employer].

## 2022-07-14 NOTE — Progress Notes (Unsigned)
Subjective:    Patient ID: Gene [name], male    DOB: [birth year], 82 y.o.   MRN: [id number]  DOS:  07/14/2022 Type of visit - description: f/u  The patient has a couple concerns. He does not exercise regularly but the most strenuous activity is going shopping. In the last few months has noted that when he goes shopping he develops tightness at the upper chest bilaterally and some DOE. Symptoms decreased with resting. No SS chest pain.  No diaphoresis, no nausea or vomiting. Denies cough or quizzing. The patient is concerned this could be some sort of heart disease.  Also, his general mobility has decreased lately, had a fall at home fortunately with no major consequence.   Review of Systems See above   Past Medical History:  Diagnosis Date   Anemia    "just in 2016 when I was bleeding internally" (08/28/2015)   Anxiety    Aortic stenosis    Arthritis    Cervical spondylolysis    Depression    Diabetic ulcer of left foot (HCC) 08/28/2015   pt states he does not have diabetes   Family history of anesthesia complication    "father would start seeing things"   Heart murmur    "related to the aortic stenosis" (08/28/2015)   History of alcohol abuse    History of blood transfusion    "just in 2016 when I was bleeding internally" (08/28/2015)   Hyperlipidemia    high TG   Hypertension    Lesion of ulnar nerve 07/22/2012   Bilateral ulnar neuropathies   Neuropathy    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   left   PVD (peripheral vascular disease) (HCC)    PVD, mild saw Dr Allyson Sabal 2012, intolerant to pletal, Rx observation   Ulcer of esophagus 2016   "caused by positioning related to foot wound"    Past Surgical History:  Procedure Laterality Date   AMPUTATION Left 11/20/13 and 11/24/13   middle   AMPUTATION Left 01/03/2014   Procedure: Left Transmetatarsal Amputation;  Surgeon: Nadara Mustard, MD;  Location: [facility] OR;  Service: Orthopedics;  Laterality: Left;    AMPUTATION Right 07/27/2014   Procedure: Right 4th Ray Amputation;  Surgeon: Nadara Mustard, MD;  Location: [facility] OR;  Service: Orthopedics;  Laterality: Right;   AMPUTATION Right 09/12/2014   Procedure: RIGHT TRANSMETATARSAL AMPUTATION;  Surgeon: Nadara Mustard, MD;  Location: [facility] OR;  Service: Orthopedics;  Laterality: Right;   AMPUTATION Right 09/28/2014   Procedure: Right Below Knee Amputation;  Surgeon: Nadara Mustard, MD;  Location: [facility] OR;  Service: Orthopedics;  Laterality: Right;   CATARACT EXTRACTION W/ INTRAOCULAR LENS  IMPLANT, BILATERAL Bilateral 05/2015   ESOPHAGOGASTRODUODENOSCOPY N/A 08/16/2014   Procedure: ESOPHAGOGASTRODUODENOSCOPY (EGD);  Surgeon: Vida Rigger, MD;  Location: [facility] ENDOSCOPY;  Service: Endoscopy;  Laterality: N/A;   ESOPHAGOGASTRODUODENOSCOPY (EGD) WITH PROPOFOL N/A 08/17/2014   Procedure: ESOPHAGOGASTRODUODENOSCOPY (EGD) WITH PROPOFOL;  Surgeon: Graylin Shiver, MD;  Location: [facility] ENDOSCOPY;  Service: Endoscopy;  Laterality: N/A;   LAPAROSCOPIC CHOLECYSTECTOMY     TONSILLECTOMY     TRANSMETATARSAL AMPUTATION Left 10/2013    Current Outpatient Medications  Medication Instructions   acetaminophen (TYLENOL) 650 mg, Oral, Every 6 hours PRN, Reported on 08/27/2015   ALPRAZolam (XANAX) 0.5 MG tablet TAKE 1 TABLET(0.5 MG) BY MOUTH THREE TIMES DAILY AS NEEDED FOR ANXIETY   amLODipine (NORVASC) 5 mg, Oral, Daily   atorvastatin (LIPITOR) 40 mg, Oral, Daily at  bedtime   citalopram (CELEXA) 40 mg, Oral, Daily   clopidogrel (PLAVIX) 75 mg, Oral, Daily   cyanocobalamin (VITAMIN B12) 2,000 mcg, Oral, Daily   cycloSPORINE (RESTASIS) 0.05 % ophthalmic emulsion 2 drops, Both Eyes, Daily   fenofibrate 160 mg, Oral, Daily   losartan (COZAAR) 100 mg, Oral, Daily   Multiple Vitamin (MULTIVITAMIN WITH MINERALS) TABS tablet 1 tablet, Oral, Daily   mupirocin ointment (BACTROBAN) 2 % 1 application , Topical, 2 times daily   pregabalin (LYRICA) 150 mg, Oral, 2 times daily, 150mg  total (takes two 75mg   twice daily)   Propylene Glycol (SYSTANE BALANCE OP) 1 drop, Ophthalmic, 2 times daily PRN, Reported on 08/27/2015   Psyllium (METAMUCIL PO) 5 capsules, Oral, Daily   pyridOXINE (VITAMIN B6) 100 mg, Oral, Daily   RA Aspirin EC 81 mg, Oral, Daily   thiamine 50 mg, Oral, Daily   traZODone (DESYREL) 100 mg, Oral, Daily at bedtime   vitamin C 3,000 mg, Oral, Daily       Objective:   Physical Exam BP 126/84   Pulse (!) 58   Temp (!) 97.5 F (36.4 C) (Oral)   Resp 18   Ht 5\' 9"  (1.753 m)   Wt 202 lb 2 oz (91.7 kg)   SpO2 96%   BMI 29.85 kg/m  General:   Well developed, NAD, BMI noted. HEENT:  Normocephalic . Face symmetric, atraumatic Lungs:  CTA B Normal respiratory effort, no intercostal retractions, no accessory muscle use. Heart: RRR, pansystolic murmur   Lower extremities: Consistent with previous R BKA.  No edema on the left. Skin: Not pale. Not jaundice Neurologic:  alert & oriented X3.  Speech normal, gait and transferring, limited by history of previous right BKA. Psych--  Cognition and judgment appear intact.  Cooperative with normal attention span and concentration.  Behavior appropriate. No anxious or depressed appearing.      Assessment     Assessement  Hyperglycemia,  (A1c 5.8  2013) HTN Hyperlipidemia Neuropathy , peripheral:  d/t ETOH (per neuro note, Dr Anne Hahn,  2014) NCS 05-2011: Moderate neuropathy, low-grade right L5 radiculopathy Bilateral ulnar neuropathy Anxiety, depression , insomnia onset 1995 after a divorce CV: --Aortic stenosis, Dr. Clifton James: Mild per echocardiogram 12-2014, has a soft murmur --PVD, Dr. 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 Most recent ischemic evaluation was 02/2016 with normal Lexiscan. DDx include ischemia, deconditioning, symptomatic aortic stenosis. EKG today: NSR, no acute changes. Plan: Myoview  (d/w cards and they agreed this is a good first step) Hyperglycemia: Check A1c HTN: BP today is very good, continue amlodipine, losartan, checking labs. Dyslipidemia: On Lipitor, check FLP Aortic stenosis: Saw cardiology 04-2022, moderate AS and mild AI.  Next echo February 2025 Neuropathy: On pregabalin Anxiety, depression, insomnia: Controlled on citalopram/alprazolam.  For insomnia on trazodone. Gait disorder: Had a fall, declines PT, we talk about fall prevention. RTC 3 months.

## 2022-07-15 LAB — CBC WITH DIFFERENTIAL/PLATELET
Basophils Absolute: 0 10*3/uL (ref 0.0–0.1)
Basophils Relative: 0.7 % (ref 0.0–3.0)
Eosinophils Absolute: 0 10*3/uL (ref 0.0–0.7)
Eosinophils Relative: 1.1 % (ref 0.0–5.0)
HCT: 38.3 % — ABNORMAL LOW (ref 39.0–52.0)
Hemoglobin: 12.9 g/dL — ABNORMAL LOW (ref 13.0–17.0)
Lymphocytes Relative: 28 % (ref 12.0–46.0)
Lymphs Abs: 0.8 10*3/uL (ref 0.7–4.0)
MCHC: 33.7 g/dL (ref 30.0–36.0)
MCV: 90.9 fl (ref 78.0–100.0)
Monocytes Absolute: 0.3 10*3/uL (ref 0.1–1.0)
Monocytes Relative: 11.3 % (ref 3.0–12.0)
Neutro Abs: 1.7 10*3/uL (ref 1.4–7.7)
Neutrophils Relative %: 58.9 % (ref 43.0–77.0)
Platelets: 170 10*3/uL (ref 150.0–400.0)
RBC: 4.21 Mil/uL — ABNORMAL LOW (ref 4.22–5.81)
RDW: 14 % (ref 11.5–15.5)
WBC: 2.9 10*3/uL — ABNORMAL LOW (ref 4.0–10.5)

## 2022-07-15 LAB — COMPREHENSIVE METABOLIC PANEL
ALT: 18 U/L (ref 0–53)
AST: 23 U/L (ref 0–37)
Albumin: 4 g/dL (ref 3.5–5.2)
Alkaline Phosphatase: 28 U/L — ABNORMAL LOW (ref 39–117)
BUN: 14 mg/dL (ref 6–23)
CO2: 23 mEq/L (ref 19–32)
Calcium: 9 mg/dL (ref 8.4–10.5)
Chloride: 104 mEq/L (ref 96–112)
Creatinine, Ser: 0.81 mg/dL (ref 0.40–1.50)
GFR: 83.07 mL/min (ref >60.00)
Glucose, Bld: 75 mg/dL (ref 70–99)
Potassium: 4.1 mEq/L (ref 3.5–5.1)
Sodium: 137 mEq/L (ref 135–145)
Total Bilirubin: 0.9 mg/dL (ref 0.2–1.2)
Total Protein: 6 g/dL (ref 6.0–8.3)

## 2022-07-15 LAB — LIPID PANEL
Cholesterol: 153 mg/dL (ref 0–200)
HDL: 56.1 mg/dL (ref >39.00)
LDL Cholesterol: 84 mg/dL (ref 0–99)
NonHDL: 97.37
Total CHOL/HDL Ratio: 3
Triglycerides: 69 mg/dL (ref 0.0–149.0)
VLDL: 13.8 mg/dL (ref 0.0–40.0)

## 2022-07-15 LAB — HEMOGLOBIN A1C: Hgb A1c MFr Bld: 5.5 % (ref 4.6–6.5)

## 2022-07-15 NOTE — Assessment & Plan Note (Signed)
Chest pain, DOE: Patient having exertional symptoms when he goes shopping as described above. Most recent ischemic evaluation was 02/2016 with normal Lexiscan. DDx include ischemia, deconditioning, symptomatic aortic stenosis. EKG today: NSR, no acute changes. Plan: Myoview  (d/w cards and they agreed this is a good first step) Hyperglycemia: Check A1c HTN: BP today is very good, continue amlodipine, losartan, checking labs. Dyslipidemia: On Lipitor, check FLP Aortic stenosis: Saw cardiology 04-2022, moderate AS and mild AI.  Next echo February 2025 Neuropathy: On pregabalin Anxiety, depression, insomnia: Controlled on citalopram/alprazolam.  For insomnia on trazodone. Gait disorder: Had a fall, declines PT, we talk about fall prevention. RTC 3 months.

## 2022-07-17 LAB — DRUG TOX MONITOR 1 W/CONF, ORAL FLD

## 2022-07-17 MED ORDER — ATORVASTATIN CALCIUM 80 MG PO TABS: 80.0000 mg | ORAL_TABLET | Freq: Every day | ORAL | 1 refills | Status: DC

## 2022-07-17 NOTE — Addendum Note (Signed)
Addended byConrad Fortine D on: 07/17/2022 07:54 AM   Modules accepted: Orders

## 2022-07-22 ENCOUNTER — Telehealth (HOSPITAL_COMMUNITY): Payer: Self-pay | Admitting: *Deleted

## 2022-07-22 ENCOUNTER — Encounter (HOSPITAL_COMMUNITY): Payer: Self-pay

## 2022-07-22 NOTE — Telephone Encounter (Signed)
Per DPR left detailed instructions for MPI

## 2022-07-23 ENCOUNTER — Ambulatory Visit (HOSPITAL_COMMUNITY): Payer: Medicare Other | Attending: Cardiovascular Disease

## 2022-07-23 DIAGNOSIS — R079 Chest pain, unspecified: Secondary | ICD-10-CM | POA: Diagnosis not present

## 2022-07-23 DIAGNOSIS — R0609 Other forms of dyspnea: Secondary | ICD-10-CM | POA: Insufficient documentation

## 2022-07-23 LAB — MYOCARDIAL PERFUSION IMAGING
Base ST Depression (mm): 0 mm
LV dias vol: 85 mL (ref 62–150)
LV sys vol: 32 mL
Nuc Stress EF: 62 %
Peak HR: 73 {beats}/min
Rest HR: 55 {beats}/min
Rest Nuclear Isotope Dose: 10.9 mCi
SDS: 1
SRS: 0
SSS: 1
ST Depression (mm): 0 mm
Stress Nuclear Isotope Dose: 31 mCi
TID: 1.11

## 2022-07-23 MED ORDER — TECHNETIUM TC 99M TETROFOSMIN IV KIT
31.0000 | PACK | Freq: Once | INTRAVENOUS | Status: AC | PRN
  Administered 2022-07-23: 31 via INTRAVENOUS

## 2022-07-23 MED ORDER — REGADENOSON 0.4 MG/5ML IV SOLN
0.4000 mg | Freq: Once | INTRAVENOUS | Status: AC
  Administered 2022-07-23: 0.4 mg via INTRAVENOUS

## 2022-07-23 MED ORDER — TECHNETIUM TC 99M TETROFOSMIN IV KIT
10.9000 | PACK | Freq: Once | INTRAVENOUS | Status: AC | PRN
  Administered 2022-07-23: 10.9 via INTRAVENOUS

## 2022-07-27 ENCOUNTER — Other Ambulatory Visit: Payer: Self-pay | Admitting: Internal Medicine

## 2022-07-31 ENCOUNTER — Telehealth: Payer: Self-pay | Admitting: *Deleted

## 2022-07-31 NOTE — Telephone Encounter (Signed)
Left message for patient to call back to schedule follow up with Dr. Clifton James.  This is per request of PCP.  Pt having exertional symptoms.  Stress test completed by PCP on 5/16.

## 2022-08-05 ENCOUNTER — Encounter: Payer: Self-pay | Admitting: Cardiovascular Disease

## 2022-08-05 ENCOUNTER — Ambulatory Visit: Payer: Medicare Other | Attending: Cardiovascular Disease | Admitting: Cardiovascular Disease

## 2022-08-05 VITALS — BP 120/80 | HR 65 | Ht 69.0 in | Wt 198.0 lb

## 2022-08-05 DIAGNOSIS — I350 Nonrheumatic aortic (valve) stenosis: Secondary | ICD-10-CM | POA: Diagnosis not present

## 2022-08-05 DIAGNOSIS — E7800 Pure hypercholesterolemia, unspecified: Secondary | ICD-10-CM

## 2022-08-05 DIAGNOSIS — I25110 Atherosclerotic heart disease of native coronary artery with unstable angina pectoris: Secondary | ICD-10-CM | POA: Diagnosis not present

## 2022-08-05 DIAGNOSIS — I739 Peripheral vascular disease, unspecified: Secondary | ICD-10-CM

## 2022-08-05 DIAGNOSIS — I10 Essential (primary) hypertension: Secondary | ICD-10-CM

## 2022-08-05 NOTE — Progress Notes (Signed)
Chief Complaint  Patient presents with   Follow-up    Dyspnea    History of Present Illness: 82 yo male with history of PAD, HTN, hyperlipidemia and aortic stenosis who is here today for follow up. I saw him as a new patient for the evaluation of his aortic stenosis in January 2022. 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 He had been followed by Dr. Smith Robert in [place], [place] for his cardiac disease and PAD. He has been followed for mild to moderate aortic stenosis. Echo December 2019 with LVEF=60-65%, grade 1 diastolic dysfunction. Mild AS with mean gradient 14 mmHg, mild AI. Normal stress test in December 2017. He asked to have his cardiac issues followed here in [place] and he has continued to follow with Dr. Smith Robert for his PAD.  Most recent in February 2024 with LVEF=60-65%. Mild to moderate AS with mean gradient 15 mmHg.   He is here today for follow up. He was seen on 07/14/22 by Dr. Drue Novel and reported dyspnea on exertion. Nuclear stress test 07/23/22 with no ischemia. He continues to have dyspnea on exertion. Some chest tightness with exertion. He admits to being in poor physical condition due to limited mobility with his prosthetic leg. He denies any palpitations, lower extremity edema, orthopnea, PND, dizziness, near syncope or syncope.   Primary Care Physician: [name], MD  Past Medical History:  Diagnosis Date   Anemia    "just in 2016 when I was bleeding internally" (08/28/2015)   Anxiety    Aortic stenosis    Arthritis    Cervical spondylolysis    Depression    Diabetic ulcer of left foot (HCC) 08/28/2015   pt states he does not have diabetes   Family history of anesthesia complication    "father would start seeing things"   Heart murmur    "related to the aortic stenosis" (08/28/2015)   History of alcohol abuse    History of blood transfusion    "just in 2016 when I was bleeding internally" (08/28/2015)   Hyperlipidemia    high TG    Hypertension    Lesion of ulnar nerve 07/22/2012   Bilateral ulnar neuropathies   Neuropathy    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   left   PVD (peripheral vascular disease) (HCC)    PVD, mild saw Dr Allyson Sabal 2012, intolerant to pletal, Rx observation   Ulcer of esophagus 2016   "caused by positioning related to foot wound"    Past Surgical History:  Procedure Laterality Date   AMPUTATION Left 11/20/13 and 11/24/13   middle   AMPUTATION Left 01/03/2014   Procedure: Left Transmetatarsal Amputation;  Surgeon: Nadara Mustard, MD;  Location: [facility] OR;  Service: Orthopedics;  Laterality: Left;   AMPUTATION Right 07/27/2014   Procedure: Right 4th Ray Amputation;  Surgeon: Nadara Mustard, MD;  Location: [facility] OR;  Service: Orthopedics;  Laterality: Right;   AMPUTATION Right 09/12/2014   Procedure: RIGHT TRANSMETATARSAL AMPUTATION;  Surgeon: Nadara Mustard, MD;  Location: [facility] OR;  Service: Orthopedics;  Laterality: Right;   AMPUTATION Right 09/28/2014   Procedure: Right Below Knee Amputation;  Surgeon: Nadara Mustard, MD;  Location[facility] OR;  Service: Orthopedics;  Laterality: Right;   CATARACT EXTRACTION W/ INTRAOCULAR LENS  IMPLANT, BILATERAL Bilateral 05/2015   ESOPHAGOGASTRODUODENOSCOPY N/A 08/16/2014   Procedure: ESOPHAGOGASTRODUODENOSCOPY (EGD);  Surgeon: Vida Rigger, MD;  Location: [facility] ENDOSCOPY;  Service: Endoscopy;  Laterality:  N/A;   ESOPHAGOGASTRODUODENOSCOPY (EGD) WITH PROPOFOL N/A 08/17/2014   Procedure: ESOPHAGOGASTRODUODENOSCOPY (EGD) WITH PROPOFOL;  Surgeon: Graylin Shiver, MD;  Location: [facility] ENDOSCOPY;  Service: Endoscopy;  Laterality: N/A;   LAPAROSCOPIC CHOLECYSTECTOMY     TONSILLECTOMY     TRANSMETATARSAL AMPUTATION Left 10/2013    Current Outpatient Medications  Medication Sig Dispense Refill   ALPRAZolam (XANAX) 0.5 MG tablet TAKE 1 TABLET(0.5 MG) BY MOUTH THREE TIMES DAILY AS NEEDED FOR ANXIETY 90 tablet 1   amLODipine (NORVASC) 5 MG tablet Take 1 tablet (5 mg total) daily by mouth. 30  tablet 5   Ascorbic Acid (VITAMIN C) 1000 MG tablet Take 3,000 mg by mouth daily.     atorvastatin (LIPITOR) 80 MG tablet Take 1 tablet (80 mg total) by mouth at bedtime. 90 tablet 1   citalopram (CELEXA) 40 MG tablet Take 1 tablet (40 mg total) by mouth daily. 90 tablet 1   clopidogrel (PLAVIX) 75 MG tablet Take 75 mg by mouth daily.     cycloSPORINE (RESTASIS) 0.05 % ophthalmic emulsion Place 2 drops into both eyes daily.     fenofibrate 160 MG tablet Take 1 tablet (160 mg total) by mouth daily. 90 tablet 1   losartan (COZAAR) 100 MG tablet Take 1 tablet (100 mg total) by mouth daily. 90 tablet 1   Multiple Vitamin (MULTIVITAMIN WITH MINERALS) TABS tablet Take 1 tablet by mouth daily.     mupirocin ointment (BACTROBAN) 2 % Apply 1 application topically 2 (two) times daily. 22 g 0   pregabalin (LYRICA) 150 MG capsule Take 150 mg by mouth 2 (two) times daily. 150mg  total (takes two 75mg  twice daily)     Propylene Glycol (SYSTANE BALANCE OP) Apply 1 drop to eye 2 (two) times daily as needed (dry eyes). Reported on 08/27/2015     Psyllium (METAMUCIL PO) Take 5 capsules by mouth daily.      pyridOXINE (VITAMIN B-6) 100 MG tablet Take 100 mg by mouth daily.     RA ASPIRIN EC 81 MG EC tablet Take 81 mg by mouth daily.  0   thiamine 50 MG tablet Take 50 mg by mouth daily.     traZODone (DESYREL) 100 MG tablet Take 100 mg by mouth at bedtime.     vitamin B-12 (CYANOCOBALAMIN) 1000 MCG tablet Take 2,000 mcg by mouth daily.      acetaminophen (TYLENOL) 325 MG tablet Take 650 mg by mouth every 6 (six) hours as needed (pain). Reported on 08/27/2015 (Patient not taking: Reported on 08/05/2022)     No current facility-administered medications for this visit.    No Known Allergies  Social History   Socioeconomic History   Marital status: Divorced    Spouse name: Not on file   Number of children: 3   Years of education: College   Highest education level: Bachelor's degree (e.g., BA, AB, BS)   Occupational History   Occupation: retired  Tobacco Use   Smoking status: Never   Smokeless tobacco: Never  Vaping Use   Vaping Use: Never used  Substance and Sexual Activity   Alcohol use: Yes    Comment: 2 beers most days   Drug use: No   Sexual activity: Not Currently  Other Topics Concern   Not on file  Social History Narrative    Divorced since 1994; lives by himself, 3 sons, 1 GD  (all live close by [place], [place], [place])   [place] veteran     Social Determinants of Health  Financial Resource Strain: Low Risk  (07/08/2022)   Overall Financial Resource Strain (CARDIA)    Difficulty of Paying Living Expenses: Not hard at all  Food Insecurity: No Food Insecurity (07/08/2022)   Hunger Vital Sign    Worried About Running Out of Food in the Last Year: Never true    Ran Out of Food in the Last Year: Never true  Transportation Needs: No Transportation Needs (07/08/2022)   PRAPARE - Administrator, Civil Service (Medical): No    Lack of Transportation (Non-Medical): No  Physical Activity: Unknown (07/08/2022)   Exercise Vital Sign    Days of Exercise per Week: Patient declined    Minutes of Exercise per Session: Not on file  Stress: No Stress Concern Present (07/08/2022)   [employer] of Occupational Health - Occupational Stress Questionnaire    Feeling of Stress : Only a little  Social Connections: Socially Isolated (07/08/2022)   Social Connection and Isolation Panel [NHANES]    Frequency of Communication with Friends and Family: Three times a week    Frequency of Social Gatherings with Friends and Family: Once a week    Attends Religious Services: Never    Database administrator or Organizations: No    Attends Engineer, structural: Not on file    Marital Status: Divorced  Catering manager Violence: Not on file    Family History  Problem Relation Age of Onset   Hyperlipidemia Mother        alive, 28 y/o   Lung cancer Father        asbestos  releated   Colon polyps Father    Diabetes Neg Hx    Coronary artery disease Neg Hx    Prostate cancer Neg Hx    Colon cancer Neg Hx     Review of Systems:  As stated in the HPI and otherwise negative.   BP 120/80   Pulse 65   Ht 5\' 9"  (1.753 m)   Wt 89.8 kg   SpO2 98%   BMI 29.24 kg/m   Physical Examination: General: Well developed, well nourished, NAD  HEENT: OP clear, mucus membranes moist  SKIN: warm, dry. No rashes. Neuro: No focal deficits  Musculoskeletal: Muscle strength 5/5 all ext  Psychiatric: Mood and affect normal  Neck: No JVD, no carotid bruits, no thyromegaly, no lymphadenopathy.  Lungs:Clear bilaterally, no wheezes, rhonci, crackles Cardiovascular: Regular rate and rhythm. Soft systolic murmur.  Abdomen:Soft. Bowel sounds present. Non-tender.  Extremities: No left lower extremity edema. Right leg prosthesis.   EKG:  EKG is ordered today. The ekg ordered today demonstrates NSR  Echo February 2024:   1. Left ventricular ejection fraction, by estimation, is 60 to 65%. The  left ventricle has normal function. The left ventricle has no regional  wall motion abnormalities. There is mild left ventricular hypertrophy.  Left ventricular diastolic parameters  are consistent with Grade I diastolic dysfunction (impaired relaxation).   2. Right ventricular systolic function is normal. The right ventricular  size is normal. There is mildly elevated pulmonary artery systolic  pressure. The estimated right ventricular systolic pressure is 37.3 mmHg.   3. The mitral valve is degenerative. No evidence of mitral valve  regurgitation. No evidence of mitral stenosis.   4. Tricuspid valve regurgitation is moderate.   5. The aortic valve is calcified. There is moderate calcification of the  aortic valve. There is moderate thickening of the aortic valve. Aortic  valve regurgitation is mild. Mild to  moderate aortic valve stenosis.  Aortic regurgitation PHT measures 416   msec. Aortic valve area, by VTI measures 1.27 cm. Aortic valve mean  gradient measures 15.0 mmHg. Aortic valve Vmax measures 2.78 m/s.   6. The inferior vena cava is normal in size with greater than 50%  respiratory variability, suggesting right atrial pressure of 3 mmHg.   Recent Labs: 10/14/2021: TSH 1.82 07/14/2022: ALT 18; BUN 14; Creatinine, Ser 0.81; Hemoglobin 12.9; Platelets 170.0; Potassium 4.1; Sodium 137   Lipid Panel    Component Value Date/Time   CHOL 153 07/14/2022 1640   TRIG 69.0 07/14/2022 1640   TRIG 202 (HH) 01/07/2006 1456   HDL 56.10 07/14/2022 1640   CHOLHDL 3 07/14/2022 1640   VLDL 13.8 07/14/2022 1640   LDLCALC 84 07/14/2022 1640   LDLDIRECT 68.2 01/20/2013 1055     Wt Readings from Last 3 Encounters:  08/05/22 89.8 kg  07/23/22 91.6 kg  07/14/22 91.7 kg    Assessment and Plan:   1. Aortic stenosis: Moderate AS and mild AI by echo in February 2024. The stenosis appears to be moderate based on leaflet thickening and reduced leaflet excursion. He does not have symptoms that are felt to be related to his valve disease. Repeat echo February 2025   2. PAD: Followed in [place] by Dr. Smith Robert.   3. HTN: BP is well controlled. No changes  4. Hyperlipidemia: Lipids managed in primary care. Continue statin.   5. CAD with unstable angina/Dyspnea: Moderate aortic stenosis likely not contributing. Nuclear stress test two weeks ago with on ischemia. He has evidence of coronary calcification by chest CTA in 2026. No prior cardiac cath. Concern that his dyspnea is an anginal equivalent. I will arrange a right and left heart cath at [facility] on 09/02/22 at 10 am.  I have reviewed the risks, indications, and alternatives to cardiac catheterization, possible angioplasty, and stenting with the patient. Risks include but are not limited to bleeding, infection, vascular injury, stroke, myocardial infection, arrhythmia, kidney injury, radiation-related injury in the case of prolonged  fluoroscopy use, emergency cardiac surgery, and death. The patient understands the risks of serious complication is 1-2 in 1000 with diagnostic cardiac cath and 1-2% or less with angioplasty/stenting.   BMET and CBC week of cath.   Labs/ tests ordered today include:   Orders Placed This Encounter  Procedures   Basic metabolic panel   CBC   EKG 12-Lead   Disposition:   F/U with me or office APP in 2-3 months  Signed, Verne Carrow, MD 08/05/2022 4:39 PM    [facility] [address] [place], [place], [place]  [address] Phone: [phone]; Fax: [phone]

## 2022-08-05 NOTE — Patient Instructions (Addendum)
Medication Instructions:  Your physician recommends that you continue on your current medications as directed. Please refer to the Current Medication list given to you today.    Lab Work: Friday June 21 anytime 7:15 am - 4:30 pm If you have labs (blood work) drawn today and your tests are completely normal, you will receive your results only by: MyChart Message (if you have MyChart) OR A paper copy in the mail If you have any lab test that is abnormal or we need to change your treatment, we will call you to review the results.   Testing/Procedures: Your physician has requested that you have a cardiac catheterization. Cardiac catheterization is used to diagnose and/or treat various heart conditions. Doctors may recommend this procedure for a number of different reasons. The most common reason is to evaluate chest pain. Chest pain can be a symptom of coronary artery disease (CAD), and cardiac catheterization can show whether plaque is narrowing or blocking your heart's arteries. This procedure is also used to evaluate the valves, as well as measure the blood flow and oxygen levels in different parts of your heart. For further information please visit [email or web address] Please follow instruction sheet, as given.    Follow-Up: At [facility], you and your health needs are our priority.  As part of our continuing mission to provide you with exceptional heart care, we have created designated Provider Care Teams.  These Care Teams include your primary Cardiologist (physician) and Advanced Practice Providers (APPs -  Physician Assistants and Nurse Practitioners) who all work together to provide you with the care you need, when you need it.  We recommend signing up for the patient portal called "MyChart".  Sign up information is provided on this After Visit Summary.  MyChart is used to connect with patients for Virtual Visits (Telemedicine).  Patients are able to view lab/test results,  encounter notes, upcoming appointments, etc.  Non-urgent messages can be sent to your provider as well.   To learn more about what you can do with MyChart, go to [email or web address].    Your next appointment:   1-2 month(s)  Provider:   Verne Carrow, MD  or Jari Favre, PA-C or Jacolyn Reedy, PA-C        Other Instructions  You are scheduled for a Cardiac Catheterization on Wednesday, June 26 with Dr. Verne Carrow.  1. Please arrive at the [facility] (Main Entrance A) at [facility]: [address] [place], [place] [address] at 8:00 AM (This time is 2 hour(s) before your procedure to ensure your preparation). Free valet parking service is available. You will check in at ADMITTING. The support person will be asked to wait in the waiting room.  It is OK to have someone drop you off and come back when you are ready to be discharged.    Special note: Every effort is made to have your procedure done on time. Please understand that emergencies sometimes delay scheduled procedures.  2. Diet: Do not eat solid foods after midnight.  The patient may have clear liquids until 5am upon the day of the procedure.  3. Labs: You will need to have blood drawn on Friday, June 21 at [facility] at [facility]. [address], [place]  Open: 7:30am - 5pm    Phone: [phone]. You do not need to be fasting.  4. Medication instructions in preparation for your procedure:   Contrast Allergy: No   On the morning of your procedure,  take your Plavix/Clopidogrel and any morning medicines NOT listed above.  You may use sips of water.  5. Plan to go home the same day, you will only stay overnight if medically necessary. 6. Bring a current list of your medications and current insurance cards. 7. You MUST have a responsible person to drive you home. 8. Someone MUST be with you the first 24 hours after you arrive home or your discharge will be delayed. 9.  Please wear clothes that are easy to get on and off and wear slip-on shoes.  Thank you for allowing [place] to care for you!   -- [facility]

## 2022-08-12 ENCOUNTER — Telehealth: Payer: Self-pay | Admitting: Cardiovascular Disease

## 2022-08-12 NOTE — Telephone Encounter (Signed)
  Pt c/o of Chest Pain: STAT if CP now or developed within 24 hours  1. Are you having CP right now? No   2. Are you experiencing any other symptoms (ex. SOB, nausea, vomiting, sweating)? SOB  3. How long have you been experiencing CP? Last month   4. Is your CP continuous or coming and going? coming and going  5. Have you taken Nitroglycerin? No    Pt is calling, he said, his CP is still comes and goes, he gets SOB in exertion. He would like to know if Dr. Clifton James would like to reschedule his heart cath for a sooner date

## 2022-08-12 NOTE — Telephone Encounter (Signed)
Now noting symptoms in house, coming down the steps or walking room to room.  Feels burning/tightness in throat and gets uncomfortable in shoulders.  Similar but much less intense than walking through grocery store or up to our office from parking lot.  He stops and rests and symptoms resolve.   There is no associated shortness of breath with this.    He still prefers strongly to wait until 6/26 when Dr. Clifton James can do the cath but he would like to hear from Dr. Clifton James if he feels it is okay to wait.   I told him this would be up to the patient as Dr. Clifton James is unable to determine if he would be okay to wait.    He knows if symptoms occur at rest or worsen he should call EMS/seek urgent medical attention. I told him I would call him once we hear from Dr. Clifton James and made him aware that I don't know when he will be checking messages since he is away.

## 2022-08-16 ENCOUNTER — Other Ambulatory Visit: Payer: Self-pay

## 2022-08-16 ENCOUNTER — Emergency Department (HOSPITAL_COMMUNITY): Payer: Medicare Other

## 2022-08-16 ENCOUNTER — Encounter (HOSPITAL_COMMUNITY): Payer: Self-pay | Admitting: Emergency Medicine

## 2022-08-16 DIAGNOSIS — A419 Sepsis, unspecified organism: Secondary | ICD-10-CM | POA: Diagnosis not present

## 2022-08-16 DIAGNOSIS — I110 Hypertensive heart disease with heart failure: Secondary | ICD-10-CM | POA: Diagnosis present

## 2022-08-16 DIAGNOSIS — Z89511 Acquired absence of right leg below knee: Secondary | ICD-10-CM | POA: Diagnosis not present

## 2022-08-16 DIAGNOSIS — J80 Acute respiratory distress syndrome: Secondary | ICD-10-CM | POA: Diagnosis not present

## 2022-08-16 DIAGNOSIS — Z79899 Other long term (current) drug therapy: Secondary | ICD-10-CM

## 2022-08-16 DIAGNOSIS — J690 Pneumonitis due to inhalation of food and vomit: Secondary | ICD-10-CM | POA: Diagnosis not present

## 2022-08-16 DIAGNOSIS — I6523 Occlusion and stenosis of bilateral carotid arteries: Secondary | ICD-10-CM | POA: Diagnosis present

## 2022-08-16 DIAGNOSIS — F32A Depression, unspecified: Secondary | ICD-10-CM | POA: Diagnosis present

## 2022-08-16 DIAGNOSIS — Z833 Family history of diabetes mellitus: Secondary | ICD-10-CM

## 2022-08-16 DIAGNOSIS — I214 Non-ST elevation (NSTEMI) myocardial infarction: Secondary | ICD-10-CM | POA: Diagnosis present

## 2022-08-16 DIAGNOSIS — R579 Shock, unspecified: Secondary | ICD-10-CM | POA: Diagnosis not present

## 2022-08-16 DIAGNOSIS — F1010 Alcohol abuse, uncomplicated: Secondary | ICD-10-CM | POA: Diagnosis present

## 2022-08-16 DIAGNOSIS — Z83719 Family history of colon polyps, unspecified: Secondary | ICD-10-CM

## 2022-08-16 DIAGNOSIS — I468 Cardiac arrest due to other underlying condition: Secondary | ICD-10-CM | POA: Diagnosis not present

## 2022-08-16 DIAGNOSIS — R946 Abnormal results of thyroid function studies: Secondary | ICD-10-CM | POA: Diagnosis present

## 2022-08-16 DIAGNOSIS — E873 Alkalosis: Secondary | ICD-10-CM | POA: Diagnosis not present

## 2022-08-16 DIAGNOSIS — Z1152 Encounter for screening for COVID-19: Secondary | ICD-10-CM | POA: Diagnosis not present

## 2022-08-16 DIAGNOSIS — E1151 Type 2 diabetes mellitus with diabetic peripheral angiopathy without gangrene: Secondary | ICD-10-CM | POA: Diagnosis present

## 2022-08-16 DIAGNOSIS — Z89432 Acquired absence of left foot: Secondary | ICD-10-CM

## 2022-08-16 DIAGNOSIS — I5021 Acute systolic (congestive) heart failure: Secondary | ICD-10-CM | POA: Diagnosis not present

## 2022-08-16 DIAGNOSIS — K7200 Acute and subacute hepatic failure without coma: Secondary | ICD-10-CM | POA: Diagnosis not present

## 2022-08-16 DIAGNOSIS — I509 Heart failure, unspecified: Secondary | ICD-10-CM

## 2022-08-16 DIAGNOSIS — E871 Hypo-osmolality and hyponatremia: Secondary | ICD-10-CM | POA: Diagnosis present

## 2022-08-16 DIAGNOSIS — R0902 Hypoxemia: Secondary | ICD-10-CM

## 2022-08-16 DIAGNOSIS — R000 Tachycardia, unspecified: Secondary | ICD-10-CM | POA: Diagnosis present

## 2022-08-16 DIAGNOSIS — I358 Other nonrheumatic aortic valve disorders: Secondary | ICD-10-CM | POA: Diagnosis present

## 2022-08-16 DIAGNOSIS — Z7982 Long term (current) use of aspirin: Secondary | ICD-10-CM

## 2022-08-16 DIAGNOSIS — Z515 Encounter for palliative care: Secondary | ICD-10-CM

## 2022-08-16 DIAGNOSIS — I480 Paroxysmal atrial fibrillation: Secondary | ICD-10-CM | POA: Diagnosis present

## 2022-08-16 DIAGNOSIS — J9601 Acute respiratory failure with hypoxia: Secondary | ICD-10-CM

## 2022-08-16 DIAGNOSIS — I2510 Atherosclerotic heart disease of native coronary artery without angina pectoris: Secondary | ICD-10-CM | POA: Diagnosis present

## 2022-08-16 DIAGNOSIS — I350 Nonrheumatic aortic (valve) stenosis: Secondary | ICD-10-CM | POA: Diagnosis present

## 2022-08-16 DIAGNOSIS — I4891 Unspecified atrial fibrillation: Secondary | ICD-10-CM

## 2022-08-16 DIAGNOSIS — N179 Acute kidney failure, unspecified: Secondary | ICD-10-CM | POA: Diagnosis present

## 2022-08-16 DIAGNOSIS — E1140 Type 2 diabetes mellitus with diabetic neuropathy, unspecified: Secondary | ICD-10-CM | POA: Diagnosis present

## 2022-08-16 DIAGNOSIS — Z83438 Family history of other disorder of lipoprotein metabolism and other lipidemia: Secondary | ICD-10-CM

## 2022-08-16 DIAGNOSIS — E1165 Type 2 diabetes mellitus with hyperglycemia: Secondary | ICD-10-CM | POA: Diagnosis not present

## 2022-08-16 DIAGNOSIS — R6521 Severe sepsis with septic shock: Secondary | ICD-10-CM | POA: Diagnosis not present

## 2022-08-16 DIAGNOSIS — Z66 Do not resuscitate: Secondary | ICD-10-CM | POA: Diagnosis present

## 2022-08-16 DIAGNOSIS — Z801 Family history of malignant neoplasm of trachea, bronchus and lung: Secondary | ICD-10-CM

## 2022-08-16 DIAGNOSIS — Z89519 Acquired absence of unspecified leg below knee: Secondary | ICD-10-CM

## 2022-08-16 DIAGNOSIS — Z7902 Long term (current) use of antithrombotics/antiplatelets: Secondary | ICD-10-CM

## 2022-08-16 DIAGNOSIS — I5084 End stage heart failure: Secondary | ICD-10-CM | POA: Diagnosis present

## 2022-08-16 DIAGNOSIS — Z89439 Acquired absence of unspecified foot: Secondary | ICD-10-CM

## 2022-08-16 DIAGNOSIS — I5033 Acute on chronic diastolic (congestive) heart failure: Secondary | ICD-10-CM | POA: Diagnosis present

## 2022-08-16 DIAGNOSIS — G629 Polyneuropathy, unspecified: Secondary | ICD-10-CM

## 2022-08-16 DIAGNOSIS — J90 Pleural effusion, not elsewhere classified: Secondary | ICD-10-CM | POA: Diagnosis present

## 2022-08-16 DIAGNOSIS — I5041 Acute combined systolic (congestive) and diastolic (congestive) heart failure: Secondary | ICD-10-CM | POA: Diagnosis not present

## 2022-08-16 DIAGNOSIS — J9602 Acute respiratory failure with hypercapnia: Secondary | ICD-10-CM | POA: Diagnosis not present

## 2022-08-16 DIAGNOSIS — F419 Anxiety disorder, unspecified: Secondary | ICD-10-CM | POA: Diagnosis present

## 2022-08-16 DIAGNOSIS — I739 Peripheral vascular disease, unspecified: Secondary | ICD-10-CM | POA: Diagnosis present

## 2022-08-16 DIAGNOSIS — E785 Hyperlipidemia, unspecified: Secondary | ICD-10-CM | POA: Diagnosis present

## 2022-08-16 LAB — BASIC METABOLIC PANEL
Anion gap: 16 — ABNORMAL HIGH (ref 5–15)
BUN: 15 mg/dL (ref 8–23)
CO2: 17 mmol/L — ABNORMAL LOW (ref 22–32)
Calcium: 8.9 mg/dL (ref 8.9–10.3)
Chloride: 95 mmol/L — ABNORMAL LOW (ref 98–111)
Creatinine, Ser: 1.39 mg/dL — ABNORMAL HIGH (ref 0.61–1.24)
GFR, Estimated: 51 mL/min — ABNORMAL LOW (ref >60)
Glucose, Bld: 97 mg/dL (ref 70–99)
Potassium: 3.9 mmol/L (ref 3.5–5.1)
Sodium: 128 mmol/L — ABNORMAL LOW (ref 135–145)

## 2022-08-16 LAB — CBC
HCT: 38.2 % — ABNORMAL LOW (ref 39.0–52.0)
Hemoglobin: 13.1 g/dL (ref 13.0–17.0)
MCH: 31.3 pg (ref 26.0–34.0)
MCHC: 34.3 g/dL (ref 30.0–36.0)
MCV: 91.2 fL (ref 80.0–100.0)
Platelets: 169 10*3/uL (ref 150–400)
RBC: 4.19 MIL/uL — ABNORMAL LOW (ref 4.22–5.81)
RDW: 13.8 % (ref 11.5–15.5)
WBC: 6.7 10*3/uL (ref 4.0–10.5)
nRBC: 0 % (ref 0.0–0.2)

## 2022-08-16 LAB — MAGNESIUM: Magnesium: 1.7 mg/dL (ref 1.7–2.4)

## 2022-08-16 LAB — D-DIMER, QUANTITATIVE: D-Dimer, Quant: 0.91 ug/mL-FEU — ABNORMAL HIGH (ref 0.00–0.50)

## 2022-08-16 LAB — TROPONIN I (HIGH SENSITIVITY)
Troponin I (High Sensitivity): 6646 ng/L (ref <18)
Troponin I (High Sensitivity): 10311 ng/L (ref <18)

## 2022-08-16 LAB — TSH: TSH: 4.866 u[IU]/mL — ABNORMAL HIGH (ref 0.350–4.500)

## 2022-08-16 LAB — HEPARIN LEVEL (UNFRACTIONATED): Heparin Unfractionated: 0.63 IU/mL (ref 0.30–0.70)

## 2022-08-16 LAB — BRAIN NATRIURETIC PEPTIDE: B Natriuretic Peptide: 879.2 pg/mL — ABNORMAL HIGH (ref 0.0–100.0)

## 2022-08-16 MED ORDER — ASPIRIN 81 MG PO CHEW: 81.0000 mg | CHEWABLE_TABLET | ORAL | Status: AC

## 2022-08-16 MED ORDER — VITAMIN B-6 100 MG PO TABS
100.0000 mg | ORAL_TABLET | Freq: Every day | ORAL | Status: DC
  Administered 2022-08-16 – 2022-08-17 (×2): 100 mg via ORAL
  Filled 2022-08-16 (×3): qty 1

## 2022-08-16 MED ORDER — ONDANSETRON HCL 4 MG/2ML IJ SOLN: 4.0000 mg | Freq: Four times a day (QID) | INTRAMUSCULAR | Status: DC | PRN

## 2022-08-16 MED ORDER — ATORVASTATIN CALCIUM 40 MG PO TABS: 80.0000 mg | ORAL_TABLET | Freq: Every day | ORAL | Status: DC

## 2022-08-16 MED ORDER — IOHEXOL 350 MG/ML SOLN
75.0000 mL | Freq: Once | INTRAVENOUS | Status: AC | PRN
  Administered 2022-08-16: 75 mL via INTRAVENOUS

## 2022-08-16 MED ORDER — POTASSIUM CHLORIDE CRYS ER 20 MEQ PO TBCR
40.0000 meq | EXTENDED_RELEASE_TABLET | Freq: Once | ORAL | Status: AC
  Administered 2022-08-16: 40 meq via ORAL
  Filled 2022-08-16: qty 2

## 2022-08-16 MED ORDER — ASPIRIN 325 MG PO TABS: 325.0000 mg | ORAL_TABLET | Freq: Once | ORAL | Status: DC

## 2022-08-16 MED ORDER — ALPRAZOLAM 0.5 MG PO TABS
0.5000 mg | ORAL_TABLET | Freq: Three times a day (TID) | ORAL | Status: DC | PRN
  Administered 2022-08-16 – 2022-08-18 (×2): 0.5 mg via ORAL
  Filled 2022-08-16 (×3): qty 1

## 2022-08-16 MED ORDER — METOPROLOL TARTRATE 12.5 MG HALF TABLET
12.5000 mg | ORAL_TABLET | Freq: Two times a day (BID) | ORAL | Status: DC
  Administered 2022-08-16 – 2022-08-18 (×3): 12.5 mg via ORAL
  Filled 2022-08-16 (×4): qty 1

## 2022-08-16 MED ORDER — THIAMINE HCL 100 MG PO TABS
50.0000 mg | ORAL_TABLET | Freq: Every day | ORAL | Status: DC
  Administered 2022-08-16 – 2022-08-17 (×2): 50 mg via ORAL
  Filled 2022-08-16 (×5): qty 1

## 2022-08-16 MED ORDER — FUROSEMIDE 10 MG/ML IJ SOLN
40.0000 mg | Freq: Two times a day (BID) | INTRAMUSCULAR | Status: DC
  Administered 2022-08-17: 40 mg via INTRAVENOUS
  Filled 2022-08-16 (×2): qty 4

## 2022-08-16 MED ORDER — ADULT MULTIVITAMIN W/MINERALS CH
1.0000 | ORAL_TABLET | Freq: Every day | ORAL | Status: DC
  Administered 2022-08-16 – 2022-08-20 (×4): 1 via ORAL
  Filled 2022-08-16 (×4): qty 1

## 2022-08-16 MED ORDER — FENOFIBRATE 160 MG PO TABS
160.0000 mg | ORAL_TABLET | Freq: Every day | ORAL | Status: DC
  Administered 2022-08-16 – 2022-08-17 (×2): 160 mg via ORAL
  Filled 2022-08-16 (×3): qty 1

## 2022-08-16 MED ORDER — TRAZODONE HCL 50 MG PO TABS
100.0000 mg | ORAL_TABLET | Freq: Every day | ORAL | Status: DC
  Administered 2022-08-16 – 2022-08-18 (×2): 100 mg via ORAL
  Filled 2022-08-16 (×3): qty 1

## 2022-08-16 MED ORDER — SODIUM CHLORIDE 0.9 % IV SOLN: INTRAVENOUS | Status: DC

## 2022-08-16 MED ORDER — ASPIRIN 81 MG PO CHEW: 81.0000 mg | CHEWABLE_TABLET | Freq: Once | ORAL | Status: DC

## 2022-08-16 MED ORDER — CITALOPRAM HYDROBROMIDE 20 MG PO TABS
40.0000 mg | ORAL_TABLET | Freq: Every day | ORAL | Status: DC
  Administered 2022-08-16 – 2022-08-17 (×2): 40 mg via ORAL
  Filled 2022-08-16: qty 2
  Filled 2022-08-16: qty 4

## 2022-08-16 MED ORDER — OXYMETAZOLINE HCL 0.05 % NA SOLN
1.0000 | Freq: Two times a day (BID) | NASAL | Status: AC
  Administered 2022-08-16 – 2022-08-18 (×3): 1 via NASAL

## 2022-08-16 MED ORDER — PREGABALIN 50 MG PO CAPS
100.0000 mg | ORAL_CAPSULE | Freq: Three times a day (TID) | ORAL | Status: DC
  Administered 2022-08-16 – 2022-08-18 (×5): 100 mg via ORAL
  Filled 2022-08-16 (×6): qty 1

## 2022-08-16 MED ORDER — FUROSEMIDE 10 MG/ML IJ SOLN
80.0000 mg | Freq: Once | INTRAMUSCULAR | Status: AC
  Administered 2022-08-16: 80 mg via INTRAVENOUS
  Filled 2022-08-16: qty 8

## 2022-08-16 MED ORDER — FUROSEMIDE 10 MG/ML IJ SOLN
40.0000 mg | Freq: Once | INTRAMUSCULAR | Status: AC
  Administered 2022-08-16: 40 mg via INTRAVENOUS
  Filled 2022-08-16: qty 4

## 2022-08-16 MED ORDER — ONDANSETRON HCL 4 MG PO TABS: 4.0000 mg | ORAL_TABLET | Freq: Four times a day (QID) | ORAL | Status: DC | PRN

## 2022-08-16 MED ORDER — ATORVASTATIN CALCIUM 80 MG PO TABS
80.0000 mg | ORAL_TABLET | Freq: Every day | ORAL | Status: DC
  Administered 2022-08-16 – 2022-08-17 (×2): 80 mg via ORAL
  Filled 2022-08-16 (×3): qty 1

## 2022-08-16 MED ORDER — HEPARIN BOLUS VIA INFUSION
4000.0000 [IU] | Freq: Once | INTRAVENOUS | Status: AC
  Administered 2022-08-16: 4000 [IU] via INTRAVENOUS
  Filled 2022-08-16: qty 4000

## 2022-08-16 MED ORDER — HEPARIN (PORCINE) 25000 UT/250ML-% IV SOLN
1100.0000 [IU]/h | INTRAVENOUS | Status: DC
  Administered 2022-08-16 – 2022-08-18 (×3): 1100 [IU]/h via INTRAVENOUS
  Filled 2022-08-16 (×3): qty 250

## 2022-08-16 MED ORDER — ASPIRIN 81 MG PO TBEC
81.0000 mg | DELAYED_RELEASE_TABLET | Freq: Every day | ORAL | Status: DC
  Administered 2022-08-17: 81 mg via ORAL
  Filled 2022-08-16: qty 1

## 2022-08-16 MED ORDER — VITAMIN B-12 1000 MCG PO TABS
2000.0000 ug | ORAL_TABLET | Freq: Every day | ORAL | Status: DC
  Administered 2022-08-16 – 2022-08-17 (×2): 2000 ug via ORAL
  Filled 2022-08-16 (×2): qty 2

## 2022-08-16 MED ORDER — CLOPIDOGREL BISULFATE 75 MG PO TABS
75.0000 mg | ORAL_TABLET | Freq: Every day | ORAL | Status: DC
  Administered 2022-08-16 – 2022-08-17 (×2): 75 mg via ORAL
  Filled 2022-08-16 (×2): qty 1

## 2022-08-16 NOTE — ED Notes (Signed)
Dr. Eloise Harman notified of elevated troponin level

## 2022-08-16 NOTE — Consult Note (Addendum)
Cardiology Consultation   Patient ID: Gene [name] MRN: [id number]; DOB: 19-Apr-1940  Admit date: 08/15/2022 Date of Consult: 08/20/2022  PCP:  Gene Plump, MD   [facility] Providers Cardiologist:  Gene Carrow, MD        Patient Profile:   Gene [name] is a 82 y.o. 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 Jomarie [name].  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  Patient was initially seen by cardiology service for evaluation of aortic stenosis in January 2022.  Previous lower extremity arterial Doppler obtained in 2017 was negative for significant aortoiliac disease.  Recent echocardiogram obtained in February 2024 demonstrated EF 60 to 65%, mild to moderate aortic stenosis with mean gradient 15 mmHg.  He was recently seen by Dr. Clifton [name] on 08/05/2022 at which time he described dyspnea on exertion and intermittent chest discomfort in the [name] shoulder pain.  Symptom has been going on for the past 6 months and has been worsening.  Myoview obtained on 07/23/2022 showed no ischemia.  Given persistent symptoms, Dr. Clifton [name] recommended [name] and right heart cath.  It was mentioned that he does have coronary artery calcification seen on the previous CTA.  Unfortunately prior to his outpatient cardiac catheterization could be completed, patient returned to [facility] emergency room on 08/14/2022 with worsening chest pain and dyspnea.  Blood pressure on arrival was 100/69.  Heart rate initially was tachycardic in the 130s.  Telemetry showed transient atrial fibrillation.  He was given metoprolol tartrate 12.5 mg  twice a day.  Chest x-ray showed enlarged heart with linear opacity along the bases, atelectasis versus subtle pulmonary infiltrate.  He was given 40 mg of IV Lasix due to dyspnea.  Significant lab work include TSH of 4.8.  D-dimer of 0.91.  Hemoglobin 13.1.  Serial troponin [phone].  BNP 879.  Sodium was low at 128.  Creatinine 1.39.  CTA of the chest showed no evidence of PE however does reveal bilateral lower lobe consolidation with small [name] and moderate right pleural effusion, patchy groundglass opacity in the upper lobe and the right middle lobe which may be atelectasis but cannot rule out infection.  Cardiology service consulted for elevated troponin.   Past Medical History:  Diagnosis Date   Anemia    "just in 2016 when I was bleeding internally" (08/28/2015)   Anxiety    Aortic stenosis    Arthritis    Cervical spondylolysis    Depression    Diabetic ulcer of [name] foot (HCC) 08/28/2015   pt states he does not have diabetes   Family history of anesthesia complication    "father would start seeing things"   Heart murmur    "related to the aortic stenosis" (08/28/2015)   History of alcohol abuse    History of blood transfusion    "just in 2016 when I was bleeding internally" (08/28/2015)   Hyperlipidemia    high TG   Hypertension    Lesion of ulnar nerve 07/22/2012   Bilateral ulnar neuropathies   Neuropathy    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   [name]   PVD (peripheral vascular disease) (  HCC)    PVD, mild saw Dr Gene [name] 2012, intolerant to pletal, Rx observation   Ulcer of esophagus 2016   "caused by positioning related to foot wound"    Past Surgical History:  Procedure Laterality Date   AMPUTATION [name] 11/20/13 and 11/24/13   middle   AMPUTATION [name] 01/03/2014   Procedure: [name] Transmetatarsal Amputation;  Surgeon: Gene Mustard, MD;  Location: [facility] OR;  Service: Orthopedics;  Laterality: [name];   AMPUTATION Right 07/27/2014   Procedure: Right 4th Ray  Amputation;  Surgeon: Gene Mustard, MD;  Location: [facility] OR;  Service: Orthopedics;  Laterality: Right;   AMPUTATION Right 09/12/2014   Procedure: RIGHT TRANSMETATARSAL AMPUTATION;  Surgeon: Gene Mustard, MD;  Location: [facility] OR;  Service: Orthopedics;  Laterality: Right;   AMPUTATION Right 09/28/2014   Procedure: Right Below Knee Amputation;  Surgeon: Gene Mustard, MD;  Location: [facility] OR;  Service: Orthopedics;  Laterality: Right;   CATARACT EXTRACTION W/ INTRAOCULAR LENS  IMPLANT, BILATERAL Bilateral 05/2015   ESOPHAGOGASTRODUODENOSCOPY N/A 08/16/2014   Procedure: ESOPHAGOGASTRODUODENOSCOPY (EGD);  Surgeon: Gene Rigger, MD;  [facility] END[facility]COPY;  Service: Endoscopy;  Laterality: N/A;   ESOPHAGOGASTRODUODENOSCOPY (EGD) WITH PROPOFOL N/A 08/17/2014   Procedure: ESOPHAGOGASTRODUODENOSCOPY (EGD) WITH PROPOFOL;  Surgeon: Gene Shiver, MD;  Location: [facility] ENDOSCOPY;  Service: Endoscopy;  Laterality: N/A;   LAPAROSCOPIC CHOLECYSTECTOMY     TONSILLECTOMY     TRANSMETATARSAL AMPUTATION [name] 10/2013     Home Medications:  Prior to Admission medications   Medication Sig Start Date End Date Taking? Authorizing Provider  acetaminophen (TYLENOL) 325 MG tablet Take 650 mg by mouth every 6 (six) hours as needed (pain). Reported on 08/27/2015 Patient not taking: Reported on 08/05/2022    [provider]  ALPRAZolam (XANAX) 0.5 MG tablet TAKE 1 TABLET(0.5 MG) BY MOUTH THREE TIMES DAILY AS NEEDED FOR ANXIETY 07/07/22   Gene Plump, MD  amLODipine (NORVASC) 5 MG tablet Take 1 tablet (5 mg total) daily by mouth. 01/15/17   Gene Plump, MD  Ascorbic Acid (VITAMIN C) 1000 MG tablet Take 3,000 mg by mouth daily.    [provider]  atorvastatin (LIPITOR) 80 MG tablet Take 1 tablet (80 mg total) by mouth at bedtime. 07/17/22   Gene Plump, MD  citalopram (CELEXA) 40 MG tablet Take 1 tablet (40 mg total) by mouth daily. 07/27/22   Gene Plump, MD  clopidogrel (PLAVIX) 75 MG tablet Take 75 mg by mouth daily.     [provider]  cycloSPORINE (RESTASIS) 0.05 % ophthalmic emulsion Place 2 drops into both eyes daily.    [provider]  fenofibrate 160 MG tablet Take 1 tablet (160 mg total) by mouth daily. 03/04/22   Gene Plump, MD  losartan (COZAAR) 100 MG tablet Take 1 tablet (100 mg total) by mouth daily. 02/15/19   Gene Plump, MD  Multiple Vitamin (MULTIVITAMIN WITH MINERALS) TABS tablet Take 1 tablet by mouth daily.    [provider]  mupirocin ointment (BACTROBAN) 2 % Apply 1 application topically 2 (two) times daily. 09/06/20   Gene Plump, MD  pregabalin (LYRICA) 150 MG capsule Take 150 mg by mouth 2 (two) times daily. 150mg  total (takes two 75mg  twice daily)    [provider]  Propylene Glycol (SYSTANE BALANCE OP) Apply 1 drop to eye 2 (two) times daily as needed (dry eyes). Reported on 08/27/2015    [provider]  Psyllium (METAMUCIL PO) Take 5 capsules  by mouth daily.     [provider]  pyridOXINE (VITAMIN B-6) 100 MG tablet Take 100 mg by mouth daily.    [provider]  RA ASPIRIN EC 81 MG EC tablet Take 81 mg by mouth daily. 12/06/14   [provider]  thiamine 50 MG tablet Take 50 mg by mouth daily.    [provider]  traZODone (DESYREL) 100 MG tablet Take 100 mg by mouth at bedtime.    [provider]  vitamin B-12 (CYANOCOBALAMIN) 1000 MCG tablet Take 2,000 mcg by mouth daily.     [provider]    Inpatient Medications: Scheduled Meds:  furosemide  80 mg Intravenous Once   metoprolol tartrate  12.5 mg Oral BID   pregabalin  100 mg Oral TID   Continuous Infusions:  heparin 1,100 Units/hr (09/04/2022 1448)   PRN Meds:   Allergies:   No Known Allergies  Social History:   Social History   Socioeconomic History   Marital status: Divorced    Spouse name: Not on file   Number of children: 3   Years of education: College   Highest education level: Bachelor's degree (e.g., BA, AB,  BS)  Occupational History   Occupation: retired  Tobacco Use   Smoking status: Never   Smokeless tobacco: Never  Vaping Use   Vaping Use: Never used  Substance and Sexual Activity   Alcohol use: Yes    Comment: 2 beers most days   Drug use: No   Sexual activity: Not Currently  Other Topics Concern   Not on file  Social History Narrative    Divorced since 1994; lives by himself, 3 sons, 1 GD  (all live close by [place], , [place])   [place] veteran     Social Determinants of Health   Financial Resource Strain: Low Risk  (07/08/2022)   Overall Financial Resource Strain (CARDIA)    Difficulty of Paying Living Expenses: Not hard at all  Food Insecurity: No Food Insecurity (07/08/2022)   Hunger Vital Sign    Worried About Running Out of Food in the Last Year: Never true    Ran Out of Food in the Last Year: Never true  Transportation Needs: No Transportation Needs (07/08/2022)   PRAPARE - Administrator, Civil Service (Medical): No    Lack of Transportation (Non-Medical): No  Physical Activity: Unknown (07/08/2022)   Exercise Vital Sign    Days of Exercise per Week: Patient declined    Minutes of Exercise per Session: Not on file  Stress: No Stress Concern Present (07/08/2022)   [employer] of Occupational Health - Occupational Stress Questionnaire    Feeling of Stress : Only a little  Social Connections: Socially Isolated (07/08/2022)   Social Connection and Isolation Panel [NHANES]    Frequency of Communication with Friends and Family: Three times a week    Frequency of Social Gatherings with Friends and Family: Once a week    Attends Religious Services: Never    Database administrator or Organizations: No    Attends Engineer, structural: Not on file    Marital Status: Divorced  Catering manager Violence: Not on file    Family History:    Family History  Problem Relation Age of Onset   Hyperlipidemia Mother        alive, 54 y/o   Lung cancer Father         asbestos releated   Colon polyps Father  Diabetes Neg Hx    Coronary artery disease Neg Hx    Prostate cancer Neg Hx    Colon cancer Neg Hx      ROS:  Please see the history of present illness.   All other ROS reviewed and negative.     Physical Exam/Data:   Vitals:   08/30/2022 1513 08/08/2022 1515 09/03/2022 1530 08/22/2022 1630  BP: 106/76  92/81 110/74  Pulse: (!) 118 (!) 104 92 92  Resp: (!) 21 (!) 21 (!) 22 19  Temp:      SpO2: 91% 90% 98% 92%  Weight:      Height:       No intake or output data in the 24 hours ending 09/06/2022 1712    08/10/2022   12:40 PM 08/05/2022    3:41 PM 07/23/2022   12:30 PM  Last 3 Weights  Weight (lbs) 198 lb 198 lb 202 lb  Weight (kg) 89.812 kg 89.812 kg 91.627 kg     Body mass index is 29.24 kg/m.  General:  Well nourished, well developed, in no acute distress HEENT: normal Neck: no JVD Vascular: No carotid bruits; Distal pulses 2+ bilaterally Cardiac:  normal S1, S2; RRR; no murmur  Lungs: Diminished breath sound in the right base. Abd: soft, nontender, no hepatomegaly  Ext: no edema Musculoskeletal:  No deformities, BUE and BLE strength normal and equal Skin: warm and dry  Neuro:  CNs 2-12 intact, no focal abnormalities noted Psych:  Normal affect   EKG:  The EKG was personally reviewed and demonstrates: Atrial fibrillation with ST depression in the inferolateral leads. Telemetry:  Telemetry was personally reviewed and demonstrates: Atrial fibrillation  Relevant CV Studies:  Echo 04/13/2022  1. [name] ventricular ejection fraction, by estimation, is 60 to 65%. The  [name] ventricle has normal function. The [name] ventricle has no regional  wall motion abnormalities. There is mild [name] ventricular hypertrophy.  [name] ventricular diastolic parameters  are consistent with Grade I diastolic dysfunction (impaired relaxation).   2. Right ventricular systolic function is normal. The right ventricular  size is normal. There is mildly  elevated pulmonary artery systolic  pressure. The estimated right ventricular systolic pressure is 37.3 mmHg.   3. The mitral valve is degenerative. No evidence of mitral valve  regurgitation. No evidence of mitral stenosis.   4. Tricuspid valve regurgitation is moderate.   5. The aortic valve is calcified. There is moderate calcification of the  aortic valve. There is moderate thickening of the aortic valve. Aortic  valve regurgitation is mild. Mild to moderate aortic valve stenosis.  Aortic regurgitation PHT measures 416  msec. Aortic valve area, by VTI measures 1.27 cm. Aortic valve mean  gradient measures 15.0 mmHg. Aortic valve Vmax measures 2.78 m/s.   6. The inferior vena cava is normal in size with greater than 50%  respiratory variability, suggesting right atrial pressure of 3 mmHg.   Comparison(s): No significant change from prior study. Prior images  reviewed side by side. 04/17/2020: AV mean gradient 18 mmHg.    Myoview 07/23/2022   The study is normal. The study is low risk.   No ST deviation was noted.   LV perfusion is normal. There is no evidence of ischemia. There is no evidence of infarction.   [name] ventricular function is normal. End diastolic cavity size is normal. End systolic cavity size is normal.   Prior study not available for comparison.   Normal resting and stress perfusion. No ischemia or infarction  EF 62% Laboratory Data:  High Sensitivity Troponin:   Recent Labs  Lab 08/23/2022 1242 08/28/2022 1444  TROPONINIHS 6,646* 10,311*     Chemistry Recent Labs  Lab 08/12/2022 1242  NA 128*  K 3.9  CL 95*  CO2 17*  GLUCOSE 97  BUN 15  CREATININE 1.39*  CALCIUM 8.9  MG 1.7  GFRNONAA 51*  ANIONGAP 16*    No results for input(s): "PROT", "ALBUMIN", "AST", "ALT", "ALKPHOS", "BILITOT" in the last 168 hours. Lipids No results for input(s): "CHOL", "TRIG", "HDL", "LABVLDL", "LDLCALC", "CHOLHDL" in the last 168 hours.  Hematology Recent Labs  Lab  08/10/2022 1242  WBC 6.7  RBC 4.19*  HGB 13.1  HCT 38.2*  MCV 91.2  MCH 31.3  MCHC 34.3  RDW 13.8  PLT 169   Thyroid  Recent Labs  Lab 08/20/2022 1242  TSH 4.866*    BNP Recent Labs  Lab 08/29/2022 1251  BNP 879.2*    DDimer  Recent Labs  Lab 08/14/2022 1242  DDIMER 0.91*     Radiology/Studies:  CT Angio Chest PE W and/or Wo Contrast  Result Date: 08/13/2022 CLINICAL DATA:  Positive D-dimer. Intermediate probability for pulmonary embolus. EXAM: CT ANGIOGRAPHY CHEST WITH CONTRAST TECHNIQUE: Multidetector CT imaging of the chest was performed using the standard protocol during bolus administration of intravenous contrast. Multiplanar CT image reconstructions and MIPs were obtained to evaluate the vascular anatomy. RADIATION DOSE REDUCTION: This exam was performed according to the departmental dose-optimization program which includes automated exposure control, adjustment of the mA and/or kV according to patient size and/or use of iterative reconstruction technique. CONTRAST:  75mL OMNIPAQUE IOHEXOL 350 MG/ML SOLN COMPARISON:  09/15/2014 FINDINGS: Cardiovascular: The heart size is upper normal to borderline enlarged. No substantial pericardial effusion. Coronary artery calcification is evident. Moderate atherosclerotic calcification is noted in the wall of the thoracic aorta. There is no filling defect within the opacified pulmonary arteries to suggest the presence of an acute pulmonary embolus. Mediastinum/Nodes: No mediastinal lymphadenopathy. There is no hilar lymphadenopathy. The esophagus has normal imaging features. There is no axillary lymphadenopathy. Lungs/Pleura: Bilateral lower lobe collapse/consolidation is associated with small [name] and moderate right pleural effusions. Patchy ground-glass opacity in the upper lobes and right middle lobe may be atelectasis although component of underlying airspace infection is not excluded. Upper Abdomen: Unremarkable. Musculoskeletal: No  worrisome lytic or sclerotic osseous abnormality. Review of the MIP images confirms the above findings. IMPRESSION: 1. No CT evidence for acute pulmonary embolus. 2. Bilateral lower lobe collapse/consolidation with small [name] and moderate right pleural effusions. 3. Patchy ground-glass opacity in the upper lobes and right middle lobe may be atelectasis although component of underlying airspace infection is not excluded. 4.  Aortic Atherosclerosis (ICD10-I70.0). Electronically Signed   By: Kennith Center M.D.   On: 08/26/2022 15:20   DG Chest Portable 1 View  Result Date: 08/25/2022 CLINICAL DATA:  Chest pain EXAM: PORTABLE CHEST 1 VIEW COMPARISON:  None Available. FINDINGS: Enlarged cardiopericardial silhouette. There is some linear changes along lung bases. Atelectasis versus subtle infiltrate. Recommend follow-up. No pneumothorax or significant effusion. Calcified aorta. Films under penetrated. Degenerative changes of the spine. IMPRESSION: Enlarged heart. Linear opacity seen along bases. Atelectasis versus subtle infiltrate. Recommend follow-up Electronically Signed   By: Karen Kays M.D.   On: 08/13/2022 13:28     Assessment and Plan:   NSTEMI  -Recent Myoview obtained on 07/23/2022 was negative for ischemia.  Previous echocardiogram in February 2024 showed normal EF, mild to moderate  aortic stenosis.  -Patient described exertional chest pain and worsening shortness of breath for the past 6 months.  Serial troponin obtained in the emergency room showed ST depression in the inferolateral leads.  -At the time of interview, patient does not have any chest pain.  Currently on IV heparin.  Serial troponin went up to 10,000.   -Plan for [name] and right heart cath tomorrow morning.  Add aspirin.  Obtain echocardiogram.  Atrial fibrillation: New diagnosis.  Started on metoprolol tartrate 12.5 mg twice a day.  Continue IV heparin given NSTEMI.  Will reassess after cardiac catheterization.  Pleural effusion:  Given 40 mg IV Lasix in the ED.  Will give additional 1 dose 80 mg IV Lasix.  PAD: History of right BKA  Hypertension  Hyperlipidemia: High-dose statin.  Obesity   Risk Assessment/Risk Scores:     TIMI Risk Score for Unstable Angina or Non-ST Elevation MI:   The patient's TIMI risk score is 6, which indicates a 41% risk of all cause mortality, new or recurrent myocardial infarction or need for urgent revascularization in the next 14 days.  New York Heart Association (NYHA) Functional Class NYHA Class IV  CHA2DS2-VASc Score = 5   This indicates a 7.2% annual risk of stroke. The patient's score is based upon: CHF History: 1 HTN History: 1 Diabetes History: 0 Stroke History: 0 Vascular Disease History: 1 Age Score: 2 Gender Score: 0     For questions or updates, please contact [facility] Please consult [email or web address] for contact info under    Ramond Dial, [place]  08/10/2022 5:12 PM   Attending note:  Patient seen and examined.  I reviewed his records and discussed case with Gene [name], I agree with his above findings.  Gene [name] was recently evaluated by Dr. Clifton [name] for evaluation of worsening dyspnea on exertion with intermittent chest tightness and [name] shoulder pain concerning for angina.  He had undergone a recent Lexiscan Myoview in mid May that did not indicate ischemia.  Plan was for [name] and right heart catheterization later this month as an outpatient.  Patient presents to the ER via EMS now reporting suddenly worsening shortness of breath, also chest tightness.  He has ruled in for NSTEMI with high-sensitivity troponin I level up to 10,000.  ECG shows nonspecific ST-T changes.  He has had some transient atrial fibrillation documented as well.  He has no known history of ischemic heart disease based on prior noninvasive workup.  Echocardiogram in February of this year revealed LVEF 60 to 65% with mild to moderate aortic stenosis and mean gradient 15  mmHg.  He does have PAD and is status post right BKA and [name] transmetatarsal amputation.  On examination he reports no breathlessness at rest or chest tightness but remains with oxygen requirement.  Lungs exhibit decreased breath sounds, some rhonchi at the bases without egophony.  Cardiac exam with RRR and no gallop, 2/6 systolic murmur.  He is status post right BKA and [name] transmetatarsal amputation.  Peripheral edema evident on the [name].  Pertinent lab work includes sodium 128, BUN 15, creatinine 1.39, high-sensitivity troponin I 10,000 311, BNP 879, hemoglobin 13.1, TSH 4.07.  Chest CTA per ED evaluation is negative for pulmonary embolus.  Small [name] and moderate right pleural effusions noted.  Also also atelectasis and aortic atherosclerosis.  ECG shows probable atrial fibrillation with ST-T wave abnormalities, inferolateral ST segment depression.  Current telemetry on my examination shows sinus rhythm.  NSTEMI.  Patient has  known PAD and also mild to moderate aortic stenosis as discussed above.  Creatinine 1.39.  LVEF 60 to 65% by echocardiogram in February.  Plan is admission for further evaluation, currently on the hospitalist team.  Continue IV heparin, will give an additional dose of IV Lasix and observe, he has not had much urine output yet so far.  Recheck BMET in a.m. and keep NPO.  Start low-dose beta-blocker and high-dose statin.  Anticipate cardiac catheterization tomorrow.  Jonelle Sidle, M.D., F.A.C.C.

## 2022-08-16 NOTE — ED Triage Notes (Signed)
Per [facility] pt coming from home c/o chest pain and shortness of breath ongoing for over a week. Patient EKG showing new onset A-fib. Given 324 aspirin. On plavix. Decreased lung sounds on right side. On non rebreather.

## 2022-08-16 NOTE — ED Provider Notes (Signed)
[facility] Provider Note   CSN: [id number] Arrival date & time: 08/15/2022  1232     History  Chief Complaint  Patient presents with   Chest Pain   Shortness of Breath    Gene [name] is a 82 y.o. male.  82 year old male with a history of aortic stenosis, peripheral arterial disease, hypertension, hyperlipidemia, and heart failure preserved ejection fraction presents emergency department with shortness of breath and chest discomfort.  Patient reports that over the past year has had a gradual decline in his health with worsening shortness of breath that he attributes to his aortic stenosis.  Has been followed by cardiology for this.  Says in the past 2 days his shortness of breath got significantly worse even with a few steps tonight he is short of breath when he rolls around in bed or bends over to tie her shoes.  Has intermittently had a burning sensation in his throat that radiates to his shoulders but denies any diaphoresis or vomiting.  No history of MI personally.  Says that his chest pain has resolved at this time.  EMS gave 324 of aspirin.       Home Medications Prior to Admission medications   Medication Sig Start Date End Date Taking? Authorizing Provider  ALPRAZolam (XANAX) 0.5 MG tablet TAKE 1 TABLET(0.5 MG) BY MOUTH THREE TIMES DAILY AS NEEDED FOR ANXIETY Patient taking differently: Take 0.5 mg by mouth 3 (three) times daily as needed for anxiety. 07/07/22  Yes Paz, Nolon Rod, MD  amLODipine (NORVASC) 5 MG tablet Take 1 tablet (5 mg total) daily by mouth. 01/15/17  Yes Paz, Nolon Rod, MD  Ascorbic Acid (VITAMIN C) 1000 MG tablet Take 3,000 mg by mouth daily.   Yes [provider]  atorvastatin (LIPITOR) 80 MG tablet Take 1 tablet (80 mg total) by mouth at bedtime. 07/17/22  Yes Paz, Nolon Rod, MD  citalopram (CELEXA) 40 MG tablet Take 1 tablet (40 mg total) by mouth daily. 07/27/22  Yes Paz, Nolon Rod, MD  clopidogrel (PLAVIX)  75 MG tablet Take 75 mg by mouth daily.   Yes [provider]  cycloSPORINE (RESTASIS) 0.05 % ophthalmic emulsion Place 2 drops into both eyes daily as needed (eyes).   Yes [provider]  fenofibrate 160 MG tablet Take 1 tablet (160 mg total) by mouth daily. 03/04/22  Yes Paz, Nolon Rod, MD  losartan (COZAAR) 100 MG tablet Take 1 tablet (100 mg total) by mouth daily. 02/15/19  Yes Paz, Nolon Rod, MD  Multiple Vitamin (MULTIVITAMIN WITH MINERALS) TABS tablet Take 1 tablet by mouth daily.   Yes [provider]  pregabalin (LYRICA) 150 MG capsule Take 150 mg by mouth in the morning, at noon, and at bedtime.   Yes [provider]  Propylene Glycol (SYSTANE BALANCE OP) Apply 1 drop to eye 2 (two) times daily as needed (dry eyes). Reported on 08/27/2015   Yes [provider]  Psyllium (METAMUCIL PO) Take 5 capsules by mouth daily.    Yes [provider]  pyridOXINE (VITAMIN B-6) 100 MG tablet Take 100 mg by mouth daily.   Yes [provider]  RA ASPIRIN EC 81 MG EC tablet Take 81 mg by mouth daily. 12/06/14  Yes [provider]  thiamine 50 MG tablet Take 50 mg by mouth daily.   Yes [provider]  traZODone (DESYREL) 100 MG tablet Take 100 mg by mouth at bedtime.  Yes [provider]  vitamin B-12 (CYANOCOBALAMIN) 1000 MCG tablet Take 2,000 mcg by mouth daily.    Yes [provider]  mupirocin ointment (BACTROBAN) 2 % Apply 1 application topically 2 (two) times daily. Patient not taking: Reported on 2022-09-14 09/06/20   [name], MD      Allergies    Patient has no known allergies.    Review of Systems   Review of Systems  Physical Exam Updated Vital Signs BP 110/74   Pulse 92   Temp 97.6 F (36.4 C) (Oral)   Resp 19   Ht 5\' 9"  (1.753 m)   Wt 89.8 kg   SpO2 92%   BMI 29.24 kg/m  Physical Exam Vitals and nursing note reviewed.  Constitutional:      Appearance: He is well-developed. He is  ill-appearing.     Comments: On nonrebreather mask at 15 L/min.  Does appear ill but no significant accessory muscle use or respiratory distress noted.  HENT:     Head: Normocephalic and atraumatic.     Right Ear: External ear normal.     Left Ear: External ear normal.     Nose: Nose normal.  Eyes:     Extraocular Movements: Extraocular movements intact.     Conjunctiva/sclera: Conjunctivae normal.     Pupils: Pupils are equal, round, and reactive to light.  Cardiovascular:     Rate and Rhythm: Normal rate and regular rhythm.     Heart sounds: Murmur heard.  Pulmonary:     Effort: No respiratory distress.     Breath sounds: Rales (Left base) present.     Comments: Diminished breath sounds in right hemithorax Musculoskeletal:     Cervical back: Normal range of motion and neck supple.     Left lower leg: Edema (1+) present.     Comments: Right lower extremity BKA  Skin:    General: Skin is warm and dry.  Neurological:     Mental Status: He is alert. Mental status is at baseline.  Psychiatric:        Mood and Affect: Mood normal.        Behavior: Behavior normal.     ED Results / Procedures / Treatments   Labs (all labs ordered are listed, but only abnormal results are displayed) Labs Reviewed  BASIC METABOLIC PANEL - Abnormal; Notable for the following components:      Result Value   Sodium 128 (*)    Chloride 95 (*)    CO2 17 (*)    Creatinine, Ser 1.39 (*)    GFR, Estimated 51 (*)    Anion gap 16 (*)    All other components within normal limits  CBC - Abnormal; Notable for the following components:   RBC 4.19 (*)    HCT 38.2 (*)    All other components within normal limits  TSH - Abnormal; Notable for the following components:   TSH 4.866 (*)    All other components within normal limits  D-DIMER, QUANTITATIVE - Abnormal; Notable for the following components:   D-Dimer, Quant 0.91 (*)    All other components within normal limits  BRAIN NATRIURETIC PEPTIDE -  Abnormal; Notable for the following components:   B Natriuretic Peptide 879.2 (*)    All other components within normal limits  TROPONIN I (HIGH SENSITIVITY) - Abnormal; Notable for the following components:   Troponin I (High Sensitivity) 6,646 (*)    All other components within normal limits  TROPONIN I (  HIGH SENSITIVITY) - Abnormal; Notable for the following components:   Troponin I (High Sensitivity) 10,311 (*)    All other components within normal limits  MAGNESIUM  HEPARIN LEVEL (UNFRACTIONATED)  HEPARIN LEVEL (UNFRACTIONATED)  CBC  T4, FREE    EKG EKG Interpretation  Date/Time:  Sunday 09-03-2022 12:41:42 EDT Ventricular Rate:  97 PR Interval:    QRS Duration: 94 QT Interval:  408 QTC Calculation: 518 R Axis:   85 Text Interpretation:  Critical Test Result: AV Block Sinus tachycardia with 2nd degree A-V block (Mobitz I) Marked ST abnormality, possible inferolateral subendocardial injury Prolonged QT Abnormal ECG When compared with ECG of 15-Sep-2014 15:16, PREVIOUS ECG IS PRESENT Confirmed by Vonita Moss [phone]) on 09/03/2022 1:09:37 PM  Radiology CT Angio Chest PE W and/or Wo Contrast  Result Date: 03-Sep-2022 CLINICAL DATA:  Positive D-dimer. Intermediate probability for pulmonary embolus. EXAM: CT ANGIOGRAPHY CHEST WITH CONTRAST TECHNIQUE: Multidetector CT imaging of the chest was performed using the standard protocol during bolus administration of intravenous contrast. Multiplanar CT image reconstructions and MIPs were obtained to evaluate the vascular anatomy. RADIATION DOSE REDUCTION: This exam was performed according to the departmental dose-optimization program which includes automated exposure control, adjustment of the mA and/or kV according to patient size and/or use of iterative reconstruction technique. CONTRAST:  75mL OMNIPAQUE IOHEXOL 350 MG/ML SOLN COMPARISON:  09/15/2014 FINDINGS: Cardiovascular: The heart size is upper normal to borderline enlarged. No  substantial pericardial effusion. Coronary artery calcification is evident. Moderate atherosclerotic calcification is noted in the wall of the thoracic aorta. There is no filling defect within the opacified pulmonary arteries to suggest the presence of an acute pulmonary embolus. Mediastinum/Nodes: No mediastinal lymphadenopathy. There is no hilar lymphadenopathy. The esophagus has normal imaging features. There is no axillary lymphadenopathy. Lungs/Pleura: Bilateral lower lobe collapse/consolidation is associated with small left and moderate right pleural effusions. Patchy ground-glass opacity in the upper lobes and right middle lobe may be atelectasis although component of underlying airspace infection is not excluded. Upper Abdomen: Unremarkable. Musculoskeletal: No worrisome lytic or sclerotic osseous abnormality. Review of the MIP images confirms the above findings. IMPRESSION: 1. No CT evidence for acute pulmonary embolus. 2. Bilateral lower lobe collapse/consolidation with small left and moderate right pleural effusions. 3. Patchy ground-glass opacity in the upper lobes and right middle lobe may be atelectasis although component of underlying airspace infection is not excluded. 4.  Aortic Atherosclerosis (ICD10-I70.0). Electronically Signed   By: Kennith Center M.D.   On: 09/03/22 15:20   DG Chest Portable 1 View  Result Date: 09/03/2022 CLINICAL DATA:  Chest pain EXAM: PORTABLE CHEST 1 VIEW COMPARISON:  None Available. FINDINGS: Enlarged cardiopericardial silhouette. There is some linear changes along lung bases. Atelectasis versus subtle infiltrate. Recommend follow-up. No pneumothorax or significant effusion. Calcified aorta. Films under penetrated. Degenerative changes of the spine. IMPRESSION: Enlarged heart. Linear opacity seen along bases. Atelectasis versus subtle infiltrate. Recommend follow-up Electronically Signed   By: Karen Kays M.D.   On: Sep 03, 2022 13:28    Procedures Procedures     Medications Ordered in ED Medications  heparin ADULT infusion 100 units/mL (25000 units/225mL) (1,100 Units/hr Intravenous New Bag/Given 09-03-22 1448)  furosemide (LASIX) injection 40 mg (40 mg Intravenous Not Given 2022/09/03 1744)  pregabalin (LYRICA) capsule 100 mg (100 mg Oral Given 2022-09-03 1648)  aspirin EC tablet 81 mg (has no administration in time range)  atorvastatin (LIPITOR) tablet 80 mg (has no administration in time range)  fenofibrate tablet 160 mg (has  no administration in time range)  ALPRAZolam (XANAX) tablet 0.5 mg (has no administration in time range)  citalopram (CELEXA) tablet 40 mg (40 mg Oral Given 08/25/2022 1801)  traZODone (DESYREL) tablet 100 mg (has no administration in time range)  clopidogrel (PLAVIX) tablet 75 mg (75 mg Oral Given 08/27/2022 1800)  cyanocobalamin (VITAMIN B12) tablet 2,000 mcg (2,000 mcg Oral Given 08/17/2022 1801)  multivitamin with minerals tablet 1 tablet (1 tablet Oral Given 08/31/2022 1801)  pyridOXINE (VITAMIN B6) tablet 100 mg (has no administration in time range)  thiamine (VITAMIN B1) tablet 50 mg (has no administration in time range)  ondansetron (ZOFRAN) tablet 4 mg (has no administration in time range)    Or  ondansetron (ZOFRAN) injection 4 mg (has no administration in time range)  metoprolol tartrate (LOPRESSOR) tablet 12.5 mg (has no administration in time range)  aspirin chewable tablet 81 mg (has no administration in time range)  furosemide (LASIX) injection 40 mg (40 mg Intravenous Given 09/01/2022 1345)  heparin bolus via infusion 4,000 Units (4,000 Units Intravenous Bolus from Bag 08/12/2022 1448)  iohexol (OMNIPAQUE) 350 MG/ML injection 75 mL (75 mLs Intravenous Contrast Given 09/06/2022 1446)  potassium chloride SA (KLOR-CON M) CR tablet 40 mEq (40 mEq Oral Given 08/12/2022 1801)  furosemide (LASIX) injection 80 mg (80 mg Intravenous Given 08/26/2022 1735)    ED Course/ Medical Decision Making/ A&P Clinical Course as of 08/13/2022 1803  Sun Aug 16, 2022   1312 Dr Eldridge Dace, who is on-call for the Cath Lab, was consulted regarding the patient's EKG that does appear to show elevations in aVR with diffuse depressions.  Did discuss the concerns for possible STEMI given the patient's chest pain and shortness of breath but it was felt that these changes may be from his heart failure and aortic stenosis so decision was made to not activate the Cath Lab at this time. [RP]  [id number] Cardiology updated regarding the patient's elevated troponin recommends heparin drip at this time but does not feel that patient needs emergent transport to the Cath Lab.  Especially with his AKI at this time. [RP]  1549 Dr Jomarie Longs consulted who will admit the patient. [RP]    Clinical Course User Index [RP] Rondel Baton, MD                             Medical Decision Making Amount and/or Complexity of Data Reviewed Labs: ordered. Radiology: ordered.  Risk Prescription drug management. Decision regarding hospitalization.   Gene [name] is a 82 y.o. male with comorbidities that complicate the patient evaluation including aortic stenosis, peripheral arterial disease, hypertension, hyperlipidemia, and heart failure preserved ejection fraction presents emergency department with shortness of breath and chest discomfort.  Patient also found to be in new onset A-fib.  Initial Ddx:  MI, aortic stenosis, heart failure, PE, pneumonia, new onset atrial fibrillation  MDM:  Concerned about possible heart failure which may have precipitated by an MI or aortic stenosis for this patient.  Specially with his diminished breath sounds concerned about pulmonary effusion.  He has a significant amount of hypoxia however compared to his lung sounds so we will obtain a D-dimer to evaluate for PE.  Also get electrolytes and TSH to evaluate his atrial fibrillation.  Plan:  Labs Troponin TSH D-dimer BNP EKG Chest x-ray  ED Summary/Re-evaluation:  EKG did show some elevation in  aVR with diffuse depressions this was discussed with cardiology who would  like to hold off on activating Cath Lab at this time.  Did recommend diuresing the patient and feels the symptoms are likely due to heart failure or his aortic stenosis.  Patient D-dimer returned and was elevated so he had a CTA that showed bilateral pulmonary effusions but no evidence of PE.  Feel that his symptoms are likely due to heart failure exacerbation which may be related to his aortic stenosis.  His troponin returned and was approximately 6000 with repeat of 10,000.  Cardiology wants to start him on a heparin drip at this time.  The time of disposition was on high flow nasal cannula and not in any respiratory distress.  Patient admitted to stepdown for further management.  This patient presents to the ED for concern of complaints listed in HPI, this involves an extensive number of treatment options, and is a complaint that carries with it a high risk of complications and morbidity. Disposition including potential need for admission considered.   Dispo: Admit to Step Down  Additional history obtained from family Records reviewed Outpatient Clinic Notes The following labs were independently interpreted: Serial Troponins and show  NSTEMI I independently reviewed the following imaging with scope of interpretation limited to determining acute life threatening conditions related to emergency care: Chest x-ray and agree with the radiologist interpretation with the following exceptions: none I personally reviewed and interpreted cardiac monitoring: atrial fibrillation (normal rate) I personally reviewed and interpreted the pt's EKG: see above for interpretation  I have reviewed the patients home medications and made adjustments as needed Consults: Cardiology Social Determinants of health:  Elderly         Final Clinical Impression(s) / ED Diagnoses Final diagnoses:  Non-STEMI (non-ST elevated myocardial infarction)  (HCC)  Acute on chronic diastolic congestive heart failure (HCC)  Hypoxia  Pleural effusion  Atrial fibrillation, unspecified type (HCC)    Rx / DC Orders ED Discharge Orders     None      CRITICAL CARE Performed by: Rondel Baton   Total critical care time: 60 minutes  Critical care time was exclusive of separately billable procedures and treating other patients.  Critical care was necessary to treat or prevent imminent or life-threatening deterioration.  Critical care was time spent personally by me on the following activities: development of treatment plan with patient and/or surrogate as well as nursing, discussions with consultants, evaluation of patient's response to treatment, examination of patient, obtaining history from patient or surrogate, ordering and performing treatments and interventions, ordering and review of laboratory studies, ordering and review of radiographic studies, pulse oximetry and re-evaluation of patient's condition.    Rondel Baton, MD August 17, 2022 [phone]

## 2022-08-16 NOTE — Progress Notes (Signed)
ANTICOAGULATION CONSULT NOTE - Follow Up Consult  Pharmacy Consult for heparin Indication:  NSTEMI and new Afib  Labs: Recent Labs    08/16/22 1242 08/16/22 1444 08/16/22 2304  HGB 13.1  --   --   HCT 38.2*  --   --   PLT 169  --   --   HEPARINUNFRC  --   --  0.63  CREATININE 1.39*  --   --   TROPONINIHS 9,147* 10,311*  --     Assessment/Plan:  82yo male therapeutic on heparin with initial dosing for CP and new Afib. Will continue infusion at current rate of 1100 units/hr and confirm stable with am labs.  Vernard Gambles, PharmD, BCPS 08/16/2022 11:50 PM

## 2022-08-16 NOTE — H&P (Addendum)
History and Physical    Gene [name] DOB: 1940-06-19 DOA: 08/17/2022  Referring MD/NP/PA: EDP PCP:  Patient coming from: Home  Chief Complaint: Shortness of breath, chest pain  HPI: Gene [name] is a 81/M with history of PAD, right BKA, left transmetatarsal amputation, aortic stenosis, dyslipidemia, anxiety, depression, peripheral neuropathy presented to the ED today with worsening dyspnea on exertion.  Patient reports ongoing dyspnea with activity for few months this is progressively worsened to dyspnea at rest now. -Also reports orthopnea, lower extremity edema -Reports intermittent chest pain for the last few weeks as well. -Followed by Dr. Clifton James was scheduled to have a right and left heart cath on 6/26.  EMS was called today for worsening dyspnea, found to be hypoxic on route, aspirin 325 mg given ED Course: Hypoxic, tachycardic, placed on nonrebreather mask initially followed by 6 to 10 L high flow nasal cannula, workup noted troponin 6.6K> 10.3 K, BNP 879, creatinine 1.3, hemoglobin 13, TSH 4.8, EKG with ST-T wave changes in inferior and lateral leads, CT chest noted bilateral pleural effusions and groundglass opacities  Review of Systems: As per HPI otherwise 14 point review of systems negative.   Past Medical History:  Diagnosis Date   Anemia    "just in 2016 when I was bleeding internally" (08/28/2015)   Anxiety    Aortic stenosis    Arthritis    Cervical spondylolysis    Depression    Diabetic ulcer of left foot (HCC) 08/28/2015   pt states he does not have diabetes   Family history of anesthesia complication    "father would start seeing things"   Heart murmur    "related to the aortic stenosis" (08/28/2015)   History of alcohol abuse    History of blood transfusion    "just in 2016 when I was bleeding internally" (08/28/2015)   Hyperlipidemia    high TG   Hypertension    Lesion of ulnar nerve 07/22/2012   Bilateral ulnar neuropathies    Neuropathy    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   left   PVD (peripheral vascular disease) (HCC)    PVD, mild saw Dr Allyson Sabal 2012, intolerant to pletal, Rx observation   Ulcer of esophagus 2016   "caused by positioning related to foot wound"    Past Surgical History:  Procedure Laterality Date   AMPUTATION Left 11/20/13 and 11/24/13   middle   AMPUTATION Left 01/03/2014   Procedure: Left Transmetatarsal Amputation;  Surgeon: Nadara Mustard, MD;  Location: [facility] OR;  Service: Orthopedics;  Laterality: Left;   AMPUTATION Right 07/27/2014   Procedure: Right 4th Ray Amputation;  Surgeon: Nadara Mustard, MD;  Location: [facility] OR;  Service: Orthopedics;  Laterality: Right;   AMPUTATION Right 09/12/2014   Procedure: RIGHT TRANSMETATARSAL AMPUTATION;  Surgeon: Nadara Mustard, MD;  Location: [facility] OR;  Service: Orthopedics;  Laterality: Right;   AMPUTATION Right 09/28/2014   Procedure: Right Below Knee Amputation;  Surgeon: Nadara Mustard, MD;  Location: [facility] OR;  Service: Orthopedics;  Laterality: Right;   CATARACT EXTRACTION W/ INTRAOCULAR LENS  IMPLANT, BILATERAL Bilateral 05/2015   ESOPHAGOGASTRODUODENOSCOPY N/A 08/16/2014   Procedure: ESOPHAGOGASTRODUODENOSCOPY (EGD);  Surgeon: Vida Rigger, MD;  Location: [facility] ENDOSCOPY;  Service: Endoscopy;  Laterality: N/A;   ESOPHAGOGASTRODUODENOSCOPY (EGD) WITH PROPOFOL N/A 08/17/2014   Procedure: ESOPHAGOGASTRODUODENOSCOPY (EGD) WITH PROPOFOL;  Surgeon: Graylin Shiver, MD;  Location: [facility] ENDOSCOPY;  Service: Endoscopy;  Laterality: N/A;   LAPAROSCOPIC CHOLECYSTECTOMY  TONSILLECTOMY     TRANSMETATARSAL AMPUTATION Left 10/2013     reports that he has never smoked. He has never used smokeless tobacco. He reports current alcohol use. He reports that he does not use drugs.  No Known Allergies  Family History  Problem Relation Age of Onset   Hyperlipidemia Mother        alive, 64 y/o   Lung cancer Father        asbestos releated   Colon polyps Father    Diabetes Neg  Hx    Coronary artery disease Neg Hx    Prostate cancer Neg Hx    Colon cancer Neg Hx      Prior to Admission medications   Medication Sig Start Date End Date Taking? Authorizing Provider  acetaminophen (TYLENOL) 325 MG tablet Take 650 mg by mouth every 6 (six) hours as needed (pain). Reported on 08/27/2015 Patient not taking: Reported on 08/05/2022    [provider]  ALPRAZolam (XANAX) 0.5 MG tablet TAKE 1 TABLET(0.5 MG) BY MOUTH THREE TIMES DAILY AS NEEDED FOR ANXIETY 07/07/22   [name], MD  amLODipine (NORVASC) 5 MG tablet Take 1 tablet (5 mg total) daily by mouth. 01/15/17   [name], MD  Ascorbic Acid (VITAMIN C) 1000 MG tablet Take 3,000 mg by mouth daily.    [provider]  atorvastatin (LIPITOR) 80 MG tablet Take 1 tablet (80 mg total) by mouth at bedtime. 07/17/22   [name], MD  citalopram (CELEXA) 40 MG tablet Take 1 tablet (40 mg total) by mouth daily. 07/27/22   [name], MD  clopidogrel (PLAVIX) 75 MG tablet Take 75 mg by mouth daily.    [provider]  cycloSPORINE (RESTASIS) 0.05 % ophthalmic emulsion Place 2 drops into both eyes daily.    [provider]  fenofibrate 160 MG tablet Take 1 tablet (160 mg total) by mouth daily. 03/04/22   [name], MD  losartan (COZAAR) 100 MG tablet Take 1 tablet (100 mg total) by mouth daily. 02/15/19   [name], MD  Multiple Vitamin (MULTIVITAMIN WITH MINERALS) TABS tablet Take 1 tablet by mouth daily.    [provider]  mupirocin ointment (BACTROBAN) 2 % Apply 1 application topically 2 (two) times daily. 09/06/20   [name], MD  pregabalin (LYRICA) 150 MG capsule Take 150 mg by mouth 2 (two) times daily. 150mg  total (takes two 75mg  twice daily)    [provider]  Propylene Glycol (SYSTANE BALANCE OP) Apply 1 drop to eye 2 (two) times daily as needed (dry eyes). Reported on 08/27/2015    [provider]  Psyllium (METAMUCIL PO) Take 5 capsules by mouth daily.      [provider]  pyridOXINE (VITAMIN B-6) 100 MG tablet Take 100 mg by mouth daily.    [provider]  RA ASPIRIN EC 81 MG EC tablet Take 81 mg by mouth daily. 12/06/14   [provider]  thiamine 50 MG tablet Take 50 mg by mouth daily.    [provider]  traZODone (DESYREL) 100 MG tablet Take 100 mg by mouth at bedtime.    [provider]  vitamin B-12 (CYANOCOBALAMIN) 1000 MCG tablet Take 2,000 mcg by mouth daily.     [provider]    Physical Exam: Vitals:   08/17/2022 1513 09/04/2022 1515 08/09/2022 1530 08/11/2022 1630  BP: 106/76  92/81 110/74  Pulse: (!) 118 (!) 104 92 92  Resp: (!) 21 (!) 21 (!) 22 19  Temp:      SpO2: 91% 90% 98% 92%  Weight:      Height:        Gen: Awake, Alert, Oriented X 3,  HEENT: Positive JVD Lungs: Rales CVS: S1S2/RRR, systolic ejection murmur Abd: soft, Non tender, non distended, BS present Extremities: 1-2+ edema, right BKA, left foot transmetatarsal amputation Skin: no new rashes on exposed skin   Labs on Admission: I have personally reviewed following labs and imaging studies  CBC: Recent Labs  Lab 08/22/2022 1242  WBC 6.7  HGB 13.1  HCT 38.2*  MCV 91.2  PLT 169   Basic Metabolic Panel: Recent Labs  Lab 09/02/2022 1242  NA 128*  K 3.9  CL 95*  CO2 17*  GLUCOSE 97  BUN 15  CREATININE 1.39*  CALCIUM 8.9  MG 1.7   GFR: Estimated Creatinine Clearance: 46.9 mL/min (A) (by C-G formula based on SCr of 1.39 mg/dL (H)). Liver Function Tests: No results for input(s): "AST", "ALT", "ALKPHOS", "BILITOT", "PROT", "ALBUMIN" in the last 168 hours. No results for input(s): "LIPASE", "AMYLASE" in the last 168 hours. No results for input(s): "AMMONIA" in the last 168 hours. Coagulation Profile: No results for input(s): "INR", "PROTIME" in the last 168 hours. Cardiac Enzymes: No results for input(s): "CKTOTAL", "CKMB", "CKMBINDEX", "TROPONINI" in the last 168 hours. BNP (last 3 results) No  results for input(s): "PROBNP" in the last 8760 hours. HbA1C: No results for input(s): "HGBA1C" in the last 72 hours. CBG: No results for input(s): "GLUCAP" in the last 168 hours. Lipid Profile: No results for input(s): "CHOL", "HDL", "LDLCALC", "TRIG", "CHOLHDL", "LDLDIRECT" in the last 72 hours. Thyroid Function Tests: Recent Labs    08/08/2022 1242  TSH 4.866*   Anemia Panel: No results for input(s): "VITAMINB12", "FOLATE", "FERRITIN", "TIBC", "IRON", "RETICCTPCT" in the last 72 hours. Urine analysis:    Component Value Date/Time   COLORURINE YELLOW 08/24/2018 1335   APPEARANCEUR CLEAR 08/24/2018 1335   LABSPEC >=1.030 (A) 08/24/2018 1335   PHURINE 5.0 08/24/2018 1335   GLUCOSEU NEGATIVE 08/24/2018 1335   HGBUR NEGATIVE 08/24/2018 1335   BILIRUBINUR NEGATIVE 08/24/2018 1335   KETONESUR TRACE (A) 08/24/2018 1335   PROTEINUR NEGATIVE 08/15/2014 1738   UROBILINOGEN 0.2 08/24/2018 1335   NITRITE NEGATIVE 08/24/2018 1335   LEUKOCYTESUR NEGATIVE 08/24/2018 1335   Sepsis Labs: @LABRCNTIP (procalcitonin:4,lacticidven:4) )No results found for this or any previous visit (from the past 240 hour(s)).   Radiological Exams on Admission: CT Angio Chest PE W and/or Wo Contrast  Result Date: 09/03/2022 CLINICAL DATA:  Positive D-dimer. Intermediate probability for pulmonary embolus. EXAM: CT ANGIOGRAPHY CHEST WITH CONTRAST TECHNIQUE: Multidetector CT imaging of the chest was performed using the standard protocol during bolus administration of intravenous contrast. Multiplanar CT image reconstructions and MIPs were obtained to evaluate the vascular anatomy. RADIATION DOSE REDUCTION: This exam was performed according to the departmental dose-optimization program which includes automated exposure control, adjustment of the mA and/or kV according to patient size and/or use of iterative reconstruction technique. CONTRAST:  75mL OMNIPAQUE IOHEXOL 350 MG/ML SOLN COMPARISON:  09/15/2014 FINDINGS:  Cardiovascular: The heart size is upper normal to borderline enlarged. No substantial pericardial effusion. Coronary artery calcification is evident. Moderate atherosclerotic calcification is noted in the wall of the thoracic aorta. There is no filling defect within the opacified pulmonary arteries to suggest the presence of an acute pulmonary embolus. Mediastinum/Nodes: No mediastinal lymphadenopathy. There is no hilar lymphadenopathy. The esophagus has normal  imaging features. There is no axillary lymphadenopathy. Lungs/Pleura: Bilateral lower lobe collapse/consolidation is associated with small left and moderate right pleural effusions. Patchy ground-glass opacity in the upper lobes and right middle lobe may be atelectasis although component of underlying airspace infection is not excluded. Upper Abdomen: Unremarkable. Musculoskeletal: No worrisome lytic or sclerotic osseous abnormality. Review of the MIP images confirms the above findings. IMPRESSION: 1. No CT evidence for acute pulmonary embolus. 2. Bilateral lower lobe collapse/consolidation with small left and moderate right pleural effusions. 3. Patchy ground-glass opacity in the upper lobes and right middle lobe may be atelectasis although component of underlying airspace infection is not excluded. 4.  Aortic Atherosclerosis (ICD10-I70.0). Electronically Signed   By: Kennith Center M.D.   On: 08/25/2022 15:20   DG Chest Portable 1 View  Result Date: 09/03/2022 CLINICAL DATA:  Chest pain EXAM: PORTABLE CHEST 1 VIEW COMPARISON:  None Available. FINDINGS: Enlarged cardiopericardial silhouette. There is some linear changes along lung bases. Atelectasis versus subtle infiltrate. Recommend follow-up. No pneumothorax or significant effusion. Calcified aorta. Films under penetrated. Degenerative changes of the spine. IMPRESSION: Enlarged heart. Linear opacity seen along bases. Atelectasis versus subtle infiltrate. Recommend follow-up Electronically Signed   By:  Karen Kays M.D.   On: 09/03/2022 13:28    EKG: Independently reviewed.  ST depressions in inferior and lateral leads  Assessment/Plan Principal Problem:  NSTEMI (non-ST elevated myocardial infarction) (HCC) -Admit to progressive unit -Received aspirin 325 mg per EMS -Start IV heparin, continue statin -Add low-dose metoprolol -Cardiology consult, anticipate need for ischemic eval when volume status has improved    Acute diastolic CHF Moderate aortic stenosis -Last echo 2/24 with preserved EF, mild to moderate aortic stenosis -Continue IV Lasix today -Hold losartan and amlodipine with soft BPs -Check repeat echo -Slowly add GDMT as blood pressure tolerates over the next few days  Acute hypoxic respiratory failure -Secondary to CHF, pleural effusion, diuretics as above  History of BKA Left transmetatarsal amputation  Peripheral neuropathy -Resume Lyrica  Anxiety, depression -Resume benzodiazepine and Lexapro per home regimen  Abnormal TSH -Check free T4   DVT prophylaxis: IV heparin Code Status: DNR, addendum, changed his mind to Full Code later Family Communication: Discussed with patient and sons at bedside Disposition Plan: Inpatient Consults called: Cards, [facility] heart care Admission status: Inpatient  Zannie Cove MD [facility]   08/20/2022, 4:40 PM

## 2022-08-16 NOTE — Progress Notes (Signed)
ANTICOAGULATION CONSULT NOTE - Initial Consult  Pharmacy Consult for heparin  Indication: chest pain/ACS  No Known Allergies  Patient Measurements: Height: 5\' 9"  (175.3 cm) Weight: 89.8 kg (198 lb) IBW/kg (Calculated) : 70.7 Heparin Dosing Weight: 88.8  Vital Signs: Temp: 97.7 F (36.5 C) (06/09 1247) BP: 99/78 (06/09 1400) Pulse Rate: 119 (06/09 1400)  Labs: Recent Labs    08/16/22 1242  HGB 13.1  HCT 38.2*  PLT 169  CREATININE 1.39*  TROPONINIHS 6,646*    Estimated Creatinine Clearance: 46.9 mL/min (A) (by C-G formula based on SCr of 1.39 mg/dL (H)).   Medical History: Past Medical History:  Diagnosis Date   Anemia    "just in 2016 when I was bleeding internally" (08/28/2015)   Anxiety    Aortic stenosis    Arthritis    Cervical spondylolysis    Depression    Diabetic ulcer of left foot (HCC) 08/28/2015   pt states he does not have diabetes   Family history of anesthesia complication    "father would start seeing things"   Heart murmur    "related to the aortic stenosis" (08/28/2015)   History of alcohol abuse    History of blood transfusion    "just in 2016 when I was bleeding internally" (08/28/2015)   Hyperlipidemia    high TG   Hypertension    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 EKG showing Afib, not on anticoag PTA. Trop elevation up to 6,646. HgB 13.1 and PLT 169. Pharmacy consulted to dose heparin.   Goal of Therapy:  Heparin level 0.3-0.7 units/ml Monitor platelets by anticoagulation protocol: Yes   Plan:  Give 4000 units bolus x 1 Start heparin infusion at 1100 units/hr Check anti-Xa level in 8  hours and daily while on heparin Continue to monitor H&H and platelets  Estill Batten, PharmD, BCCCP  08/16/2022,2:14 PM

## 2022-08-16 NOTE — ED Notes (Signed)
ED TO INPATIENT HANDOFF REPORT  ED Nurse Name and Phone #: Brett Canales [phone]  S Name/Age/Gender Iona Coach III 82 y.o. male Room/Bed: 034C/034C  Code Status   Code Status: Full Code  Home/SNF/Other Home Patient oriented to: self, place, time, and situation Is this baseline? Yes   Triage Complete: Triage complete  Chief Complaint NSTEMI (non-ST elevated myocardial infarction) [facility]) [I21.4]  Triage Note Per [facility] pt coming from home c/o chest pain and shortness of breath ongoing for over a week. Patient EKG showing new onset A-fib. Given 324 aspirin. On plavix. Decreased lung sounds on right side. On non rebreather.    Allergies No Known Allergies  Level of Care/Admitting Diagnosis ED Disposition     ED Disposition  Admit   Condition  --   Comment  Hospital Area: [facility] [[id number]]  Level of Care: Progressive [102]  Admit to Progressive based on following criteria: CARDIOVASCULAR & THORACIC of moderate stability with acute coronary syndrome symptoms/low risk myocardial infarction/hypertensive urgency/arrhythmias/heart failure potentially compromising stability and stable post cardiovascular intervention patients.  May admit patient to [facility] or [facility] if equivalent level of care is available:: No  Covid Evaluation: Asymptomatic - no recent exposure (last 10 days) testing not required  Diagnosis: NSTEMI (non-ST elevated myocardial infarction) [facility]) [[id number]]  Admitting Physician: Zannie Cove [[id number]]  Attending Physician: Zannie Cove [[id number]]  Certification:: I certify this patient will need inpatient services for at least 2 midnights  Estimated Length of Stay: 4          B Medical/Surgery History Past Medical History:  Diagnosis Date   Anemia    "just in 2016 when I was bleeding internally" (08/28/2015)   Anxiety    Aortic stenosis    Arthritis    Cervical spondylolysis    Depression    Diabetic ulcer of left foot (HCC)  08/28/2015   pt states he does not have diabetes   Family history of anesthesia complication    "father would start seeing things"   Heart murmur    "related to the aortic stenosis" (08/28/2015)   History of alcohol abuse    History of blood transfusion    "just in 2016 when I was bleeding internally" (08/28/2015)   Hyperlipidemia    high TG   Hypertension    Lesion of ulnar nerve 07/22/2012   Bilateral ulnar neuropathies   Neuropathy    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   left   PVD (peripheral vascular disease) (HCC)    PVD, mild saw Dr Allyson Sabal 2012, intolerant to pletal, Rx observation   Ulcer of esophagus 2016   "caused by positioning related to foot wound"   Past Surgical History:  Procedure Laterality Date   AMPUTATION Left 11/20/13 and 11/24/13   middle   AMPUTATION Left 01/03/2014   Procedure: Left Transmetatarsal Amputation;  Surgeon: Nadara Mustard, MD;  Location: [facility] OR;  Service: Orthopedics;  Laterality: Left;   AMPUTATION Right 07/27/2014   Procedure: Right 4th Ray Amputation;  Surgeon: Nadara Mustard, MD;  Location: [facility] OR;  Service: Orthopedics;  Laterality: Right;   AMPUTATION Right 09/12/2014   Procedure: RIGHT TRANSMETATARSAL AMPUTATION;  Surgeon: Nadara Mustard, MD;  Location: [facility] OR;  Service: Orthopedics;  Laterality: Right;   AMPUTATION Right 09/28/2014   Procedure: Right Below Knee Amputation;  Surgeon: Nadara Mustard, MD;  Location: [facility] OR;  Service: Orthopedics;  Laterality: Right;   CATARACT EXTRACTION W/ INTRAOCULAR LENS  IMPLANT, BILATERAL  Bilateral 05/2015   ESOPHAGOGASTRODUODENOSCOPY N/A 08/16/2014   Procedure: ESOPHAGOGASTRODUODENOSCOPY (EGD);  Surgeon: Vida Rigger, MD;  Location: [facility] ENDOSCOPY;  Service: Endoscopy;  Laterality: N/A;   ESOPHAGOGASTRODUODENOSCOPY (EGD) WITH PROPOFOL N/A 08/17/2014   Procedure: ESOPHAGOGASTRODUODENOSCOPY (EGD) WITH PROPOFOL;  Surgeon: Graylin Shiver, MD;  Location: [facility] ENDOSCOPY;  Service: Endoscopy;  Laterality: N/A;    LAPAROSCOPIC CHOLECYSTECTOMY     TONSILLECTOMY     TRANSMETATARSAL AMPUTATION Left 10/2013     A IV Location/Drains/Wounds Patient Lines/Drains/Airways Status     Active Line/Drains/Airways     Name Placement date Placement time Site Days   Peripheral IV 08/08/2022 18 G Right Antecubital 08/15/2022  1345  Antecubital  less than 1   Peripheral IV 09/06/2022 20 G Anterior;Right Forearm 08/10/2022  1445  Forearm  less than 1   Wound / Incision (Open or Dehisced) 06/25/19 Laceration Hand Right 06/25/19  0010  Hand  1148            Intake/Output Last 24 hours No intake or output data in the 24 hours ending 08/25/2022 1844  Labs/Imaging Results for orders placed or performed during the hospital encounter of 09/01/2022 (from the past 48 hour(s))  Basic metabolic panel     Status: Abnormal   Collection Time: 08/22/2022 12:42 PM  Result Value Ref Range   Sodium 128 (L) 135 - 145 mmol/L   Potassium 3.9 3.5 - 5.1 mmol/L   Chloride 95 (L) 98 - 111 mmol/L   CO2 17 (L) 22 - 32 mmol/L   Glucose, Bld 97 70 - 99 mg/dL    Comment: Glucose reference range applies only to samples taken after fasting for at least 8 hours.   BUN 15 8 - 23 mg/dL   Creatinine, Ser 2.13 (H) 0.61 - 1.24 mg/dL   Calcium 8.9 8.9 - 08.6 mg/dL   GFR, Estimated 51 (L) >60 mL/min    Comment: (NOTE) Calculated using the CKD-EPI Creatinine Equation (2021)    Anion gap 16 (H) 5 - 15    Comment: Performed at [facility], [address], [place], [place] [address]  CBC     Status: Abnormal   Collection Time: 08/31/2022 12:42 PM  Result Value Ref Range   WBC 6.7 4.0 - 10.5 K/uL   RBC 4.19 (L) 4.22 - 5.81 MIL/uL   Hemoglobin 13.1 13.0 - 17.0 g/dL   HCT 96.2 (L) 95.2 - 84.1 %   MCV 91.2 80.0 - 100.0 fL   MCH 31.3 26.0 - 34.0 pg   MCHC 34.3 30.0 - 36.0 g/dL   RDW 32.4 40.1 - 02.7 %   Platelets 169 150 - 400 K/uL   nRBC 0.0 0.0 - 0.2 %    Comment: Performed at [facility], [address], [place], [place] [address]   Troponin I (High Sensitivity)     Status: Abnormal   Collection Time: 08/17/2022 12:42 PM  Result Value Ref Range   Troponin I (High Sensitivity) 6,646 (HH) <18 ng/L    Comment: CRITICAL RESULT CALLED TO, READ BACK BY AND VERIFIED WITH V,Belvia Gotschall RN @1355  08/20/2022 E,[place] (NOTE) Elevated high sensitivity troponin I (hsTnI) values and significant  changes across serial measurements may suggest ACS but many other  chronic and acute conditions are known to elevate hsTnI results.  Refer to the "Links" section for chest pain algorithms and additional  guidance. Performed at [facility], [address] [place], [place] [address]   Magnesium  Status: None   Collection Time: 08/18/2022 12:42 PM  Result Value Ref Range   Magnesium 1.7 1.7 - 2.4 mg/dL    Comment: Performed at [facility], [address] [place], [place] [address]  TSH     Status: Abnormal   Collection Time: 08-18-2022 12:42 PM  Result Value Ref Range   TSH 4.866 (H) 0.350 - 4.500 uIU/mL    Comment: Performed by a 3rd Generation assay with a functional sensitivity of <=0.01 uIU/mL. Performed at [facility], [address] [place], [place] [address]   D-dimer, quantitative     Status: Abnormal   Collection Time: 2022/08/18 12:42 PM  Result Value Ref Range   D-Dimer, Quant 0.91 (H) 0.00 - 0.50 ug/mL-FEU    Comment: (NOTE) At the manufacturer cut-off value of 0.5 g/mL FEU, this assay has a negative predictive value of 95-100%.This assay is intended for use in conjunction with a clinical pretest probability (PTP) assessment model to exclude pulmonary embolism (PE) and deep venous thrombosis (DVT) in outpatients suspected of PE or DVT. Results should be correlated with clinical presentation. Performed at [facility], [address] [place], [place] [address]   Brain natriuretic peptide     Status: Abnormal   Collection Time: 18-Aug-2022 12:51 PM  Result Value Ref Range   B Natriuretic Peptide  879.2 (H) 0.0 - 100.0 pg/mL    Comment: Performed at [facility], [address] [place], [place] [address]  Troponin I (High Sensitivity)     Status: Abnormal   Collection Time: 08-18-22  2:44 PM  Result Value Ref Range   Troponin I (High Sensitivity) 10,311 (HH) <18 ng/L    Comment: CRITICAL VALUE NOTED. VALUE IS CONSISTENT WITH PREVIOUSLY REPORTED/CALLED VALUE (NOTE) Elevated high sensitivity troponin I (hsTnI) values and significant  changes across serial measurements may suggest ACS but many other  chronic and acute conditions are known to elevate hsTnI results.  Refer to the "Links" section for chest pain algorithms and additional  guidance. Performed at [facility], [address] [place], [place] [address]    CT Angio Chest PE W and/or Wo Contrast  Result Date: 08/18/22 CLINICAL DATA:  Positive D-dimer. Intermediate probability for pulmonary embolus. EXAM: CT ANGIOGRAPHY CHEST WITH CONTRAST TECHNIQUE: Multidetector CT imaging of the chest was performed using the standard protocol during bolus administration of intravenous contrast. Multiplanar CT image reconstructions and MIPs were obtained to evaluate the vascular anatomy. RADIATION DOSE REDUCTION: This exam was performed according to the departmental dose-optimization program which includes automated exposure control, adjustment of the mA and/or kV according to patient size and/or use of iterative reconstruction technique. CONTRAST:  75mL OMNIPAQUE IOHEXOL 350 MG/ML SOLN COMPARISON:  09/15/2014 FINDINGS: Cardiovascular: The heart size is upper normal to borderline enlarged. No substantial pericardial effusion. Coronary artery calcification is evident. Moderate atherosclerotic calcification is noted in the wall of the thoracic aorta. There is no filling defect within the opacified pulmonary arteries to suggest the presence of an acute pulmonary embolus. Mediastinum/Nodes: No mediastinal lymphadenopathy. There is no hilar  lymphadenopathy. The esophagus has normal imaging features. There is no axillary lymphadenopathy. Lungs/Pleura: Bilateral lower lobe collapse/consolidation is associated with small left and moderate right pleural effusions. Patchy ground-glass opacity in the upper lobes and right middle lobe may be atelectasis although component of underlying airspace infection is not excluded. Upper Abdomen: Unremarkable. Musculoskeletal: No worrisome lytic or sclerotic osseous abnormality. Review of the MIP images confirms the  above findings. IMPRESSION: 1. No CT evidence for acute pulmonary embolus. 2. Bilateral lower lobe collapse/consolidation with small left and moderate right pleural effusions. 3. Patchy ground-glass opacity in the upper lobes and right middle lobe may be atelectasis although component of underlying airspace infection is not excluded. 4.  Aortic Atherosclerosis (ICD10-I70.0). Electronically Signed   By: Kennith Center M.D.   On: 08/29/2022 15:20   DG Chest Portable 1 View  Result Date: 09/01/2022 CLINICAL DATA:  Chest pain EXAM: PORTABLE CHEST 1 VIEW COMPARISON:  None Available. FINDINGS: Enlarged cardiopericardial silhouette. There is some linear changes along lung bases. Atelectasis versus subtle infiltrate. Recommend follow-up. No pneumothorax or significant effusion. Calcified aorta. Films under penetrated. Degenerative changes of the spine. IMPRESSION: Enlarged heart. Linear opacity seen along bases. Atelectasis versus subtle infiltrate. Recommend follow-up Electronically Signed   By: Karen Kays M.D.   On: 08/12/2022 13:28    Pending Labs Unresulted Labs (From admission, onward)     Start     Ordered   08/17/22 0500  Heparin level (unfractionated)  Daily,   R     See Hyperspace for full Linked Orders Report.   09/01/2022 1422   08/17/22 0500  CBC  Daily,   R     See Hyperspace for full Linked Orders Report.   08/15/2022 1422   08/17/22 0500  T4, free  Tomorrow morning,   R        08/19/2022  1650   08/15/2022 2300  Heparin level (unfractionated)  Once-Timed,   URGENT        08/23/2022 1422   Signed and Held  CBC  Tomorrow morning,   R        Signed and Held   Signed and Held  Comprehensive metabolic panel  Tomorrow morning,   R        Signed and Held            Vitals/Pain Today's Vitals   09/02/2022 1515 08/08/2022 1530 08/14/2022 1630 08/08/2022 1734  BP:  92/81 110/74   Pulse: (!) 104 92 92   Resp: (!) 21 (!) 22 19   Temp:    97.6 F (36.4 C)  TempSrc:    Oral  SpO2: 90% 98% 92%   Weight:      Height:        Isolation Precautions No active isolations  Medications Medications  heparin ADULT infusion 100 units/mL (25000 units/260mL) (1,100 Units/hr Intravenous New Bag/Given 09/04/2022 1448)  furosemide (LASIX) injection 40 mg (40 mg Intravenous Not Given 09/03/2022 1744)  pregabalin (LYRICA) capsule 100 mg (100 mg Oral Given 09/06/2022 1648)  aspirin EC tablet 81 mg (has no administration in time range)  atorvastatin (LIPITOR) tablet 80 mg (has no administration in time range)  fenofibrate tablet 160 mg (160 mg Oral Given 09/06/2022 1813)  ALPRAZolam (XANAX) tablet 0.5 mg (has no administration in time range)  citalopram (CELEXA) tablet 40 mg (40 mg Oral Given 08/12/2022 1801)  traZODone (DESYREL) tablet 100 mg (has no administration in time range)  clopidogrel (PLAVIX) tablet 75 mg (75 mg Oral Given 08/28/2022 1800)  cyanocobalamin (VITAMIN B12) tablet 2,000 mcg (2,000 mcg Oral Given 08/20/2022 1801)  multivitamin with minerals tablet 1 tablet (1 tablet Oral Given 08/24/2022 1801)  pyridOXINE (VITAMIN B6) tablet 100 mg (100 mg Oral Given 08/29/2022 1814)  thiamine (VITAMIN B1) tablet 50 mg (50 mg Oral Given 08/19/2022 1814)  ondansetron (ZOFRAN) tablet 4 mg (has no administration in time range)    Or  ondansetron (ZOFRAN) injection 4 mg (has no administration in time range)  metoprolol tartrate (LOPRESSOR) tablet 12.5 mg (has no administration in time range)  aspirin chewable tablet 81 mg (has no  administration in time range)  oxymetazoline (AFRIN) 0.05 % nasal spray 1 spray (has no administration in time range)  furosemide (LASIX) injection 40 mg (40 mg Intravenous Given 08/31/2022 1345)  heparin bolus via infusion 4,000 Units (4,000 Units Intravenous Bolus from Bag 08/10/2022 1448)  iohexol (OMNIPAQUE) 350 MG/ML injection 75 mL (75 mLs Intravenous Contrast Given 09/02/2022 1446)  potassium chloride SA (KLOR-CON M) CR tablet 40 mEq (40 mEq Oral Given 08/08/2022 1801)  furosemide (LASIX) injection 80 mg (80 mg Intravenous Given 08/12/2022 1735)    Mobility walks     Focused Assessments Pulmonary Assessment Handoff:  Lung sounds: Bilateral Breath Sounds: Clear, Diminished L Breath Sounds: Diminished R Breath Sounds: Clear O2 Device: High Flow Nasal Cannula O2 Flow Rate (L/min): 12 L/min    R Recommendations: See Admitting Provider Note  Report given to:   Additional Notes: on 15L high flow nasal cannula

## 2022-08-17 ENCOUNTER — Inpatient Hospital Stay (HOSPITAL_COMMUNITY): Payer: Medicare Other

## 2022-08-17 DIAGNOSIS — I214 Non-ST elevation (NSTEMI) myocardial infarction: Secondary | ICD-10-CM | POA: Diagnosis not present

## 2022-08-17 DIAGNOSIS — I5021 Acute systolic (congestive) heart failure: Secondary | ICD-10-CM | POA: Diagnosis not present

## 2022-08-17 LAB — CBC
HCT: 37.2 % — ABNORMAL LOW (ref 39.0–52.0)
Hemoglobin: 13 g/dL (ref 13.0–17.0)
MCH: 30.6 pg (ref 26.0–34.0)
MCHC: 34.9 g/dL (ref 30.0–36.0)
MCV: 87.5 fL (ref 80.0–100.0)
Platelets: 190 10*3/uL (ref 150–400)
RBC: 4.25 MIL/uL (ref 4.22–5.81)
RDW: 14.1 % (ref 11.5–15.5)
WBC: 7 10*3/uL (ref 4.0–10.5)
nRBC: 0 % (ref 0.0–0.2)

## 2022-08-17 LAB — T4, FREE: Free T4: 1.24 ng/dL — ABNORMAL HIGH (ref 0.61–1.12)

## 2022-08-17 LAB — COMPREHENSIVE METABOLIC PANEL
ALT: 46 U/L — ABNORMAL HIGH (ref 0–44)
AST: 243 U/L — ABNORMAL HIGH (ref 15–41)
Albumin: 3.2 g/dL — ABNORMAL LOW (ref 3.5–5.0)
Alkaline Phosphatase: 30 U/L — ABNORMAL LOW (ref 38–126)
Anion gap: 18 — ABNORMAL HIGH (ref 5–15)
BUN: 19 mg/dL (ref 8–23)
CO2: 15 mmol/L — ABNORMAL LOW (ref 22–32)
Calcium: 8.7 mg/dL — ABNORMAL LOW (ref 8.9–10.3)
Chloride: 95 mmol/L — ABNORMAL LOW (ref 98–111)
Creatinine, Ser: 1.5 mg/dL — ABNORMAL HIGH (ref 0.61–1.24)
GFR, Estimated: 47 mL/min — ABNORMAL LOW (ref >60)
Glucose, Bld: 80 mg/dL (ref 70–99)
Potassium: 4.2 mmol/L (ref 3.5–5.1)
Sodium: 128 mmol/L — ABNORMAL LOW (ref 135–145)
Total Bilirubin: 1.6 mg/dL — ABNORMAL HIGH (ref 0.3–1.2)
Total Protein: 5.7 g/dL — ABNORMAL LOW (ref 6.5–8.1)

## 2022-08-17 LAB — BASIC METABOLIC PANEL
Anion gap: 19 — ABNORMAL HIGH (ref 5–15)
BUN: 27 mg/dL — ABNORMAL HIGH (ref 8–23)
CO2: 17 mmol/L — ABNORMAL LOW (ref 22–32)
Calcium: 9.1 mg/dL (ref 8.9–10.3)
Chloride: 93 mmol/L — ABNORMAL LOW (ref 98–111)
Creatinine, Ser: 1.62 mg/dL — ABNORMAL HIGH (ref 0.61–1.24)
GFR, Estimated: 43 mL/min — ABNORMAL LOW (ref >60)
Glucose, Bld: 95 mg/dL (ref 70–99)
Potassium: 4.3 mmol/L (ref 3.5–5.1)
Sodium: 129 mmol/L — ABNORMAL LOW (ref 135–145)

## 2022-08-17 LAB — RESPIRATORY PANEL BY PCR

## 2022-08-17 LAB — ECHOCARDIOGRAM COMPLETE
AR max vel: 0.57 cm2
AV Area VTI: 0.56 cm2
AV Area mean vel: 0.55 cm2
AV Mean grad: 10.8 mmHg
AV Peak grad: 17.8 mmHg
Ao pk vel: 2.11 m/s
Area-P 1/2: 4.89 cm2
Calc EF: 36 %
Est EF: 35
Height: 69 in
MV VTI: 0.89 cm2
P 1/2 time: 368 msec
S' Lateral: 2.9 cm
Single Plane A2C EF: 37.9 %
Single Plane A4C EF: 35.8 %
Weight: 3164.04 oz

## 2022-08-17 LAB — PROCALCITONIN: Procalcitonin: <0.1 ng/mL

## 2022-08-17 LAB — TROPONIN I (HIGH SENSITIVITY): Troponin I (High Sensitivity): >24000 ng/L (ref <18)

## 2022-08-17 LAB — SARS CORONAVIRUS 2 BY RT PCR: SARS Coronavirus 2 by RT PCR: NEGATIVE

## 2022-08-17 LAB — HEPARIN LEVEL (UNFRACTIONATED): Heparin Unfractionated: 0.58 IU/mL (ref 0.30–0.70)

## 2022-08-17 MED ORDER — FUROSEMIDE 10 MG/ML IJ SOLN: 80.0000 mg | Freq: Two times a day (BID) | INTRAMUSCULAR | Status: DC

## 2022-08-17 MED ORDER — SODIUM CHLORIDE 0.9 % IV SOLN: INTRAVENOUS | Status: DC

## 2022-08-17 MED ORDER — SODIUM CHLORIDE 0.9 % IV SOLN
1.0000 g | INTRAVENOUS | Status: DC
  Administered 2022-08-17: 1 g via INTRAVENOUS
  Filled 2022-08-17: qty 10

## 2022-08-17 MED ORDER — ASPIRIN 81 MG PO CHEW
81.0000 mg | CHEWABLE_TABLET | Freq: Once | ORAL | Status: DC
  Filled 2022-08-17: qty 1

## 2022-08-17 MED ORDER — FUROSEMIDE 10 MG/ML IJ SOLN: 80.0000 mg | Freq: Once | INTRAMUSCULAR | Status: DC

## 2022-08-17 MED ORDER — SODIUM CHLORIDE 0.9 % IV SOLN
3.0000 g | Freq: Three times a day (TID) | INTRAVENOUS | Status: DC
  Administered 2022-08-17 – 2022-08-18 (×3): 3 g via INTRAVENOUS
  Filled 2022-08-17 (×3): qty 8

## 2022-08-17 MED ORDER — PERFLUTREN LIPID MICROSPHERE
1.0000 mL | INTRAVENOUS | Status: AC | PRN
  Administered 2022-08-17: 3 mL via INTRAVENOUS

## 2022-08-17 MED ORDER — SODIUM CHLORIDE 0.9 % IV SOLN
100.0000 mg | Freq: Two times a day (BID) | INTRAVENOUS | Status: DC
  Filled 2022-08-17: qty 100

## 2022-08-17 MED ORDER — FUROSEMIDE 10 MG/ML IJ SOLN
80.0000 mg | Freq: Once | INTRAMUSCULAR | Status: AC
  Administered 2022-08-17: 80 mg via INTRAVENOUS
  Filled 2022-08-17: qty 8

## 2022-08-17 MED ORDER — ACETAMINOPHEN 325 MG PO TABS
650.0000 mg | ORAL_TABLET | Freq: Four times a day (QID) | ORAL | Status: DC | PRN
  Administered 2022-08-17: 650 mg via ORAL
  Filled 2022-08-17: qty 2

## 2022-08-17 NOTE — Progress Notes (Signed)
Rounding Note    Patient Name: Gene [name] Date of Encounter: 08/17/2022  [facility] Cardiologist: Verne Carrow, MD   Subjective   Patient denies  CP  Says his breathing is much better compared to earlier this morning   Inpatient Medications    Scheduled Meds:  aspirin  81 mg Oral Pre-Cath   aspirin EC  81 mg Oral Daily   atorvastatin  80 mg Oral QHS   citalopram  40 mg Oral Daily   clopidogrel  75 mg Oral Daily   cyanocobalamin  2,000 mcg Oral Daily   fenofibrate  160 mg Oral Daily   furosemide  80 mg Intravenous Once   furosemide  80 mg Intravenous BID   metoprolol tartrate  12.5 mg Oral BID   multivitamin with minerals  1 tablet Oral Daily   oxymetazoline  1 spray Each Nare BID   pregabalin  100 mg Oral TID   pyridOXINE  100 mg Oral Daily   thiamine  50 mg Oral Daily   traZODone  100 mg Oral QHS   Continuous Infusions:  sodium chloride     heparin 1,100 Units/hr (08/17/22 0758)   PRN Meds: acetaminophen, ALPRAZolam   Vital Signs    Vitals:   08/17/22 0406 08/17/22 0459 08/17/22 0745 08/17/22 0808  BP: (!) 112/99   110/70  Pulse: 96  96 93  Resp: 20  18 18   Temp: 100.1 F (37.8 C)   97.6 F (36.4 C)  TempSrc: Axillary   Oral  SpO2: (!) 88% 93% 96% 97%  Weight:      Height:        Intake/Output Summary (Last 24 hours) at 08/17/2022 0846 Last data filed at 08/10/2022 2000 Gross per 24 hour  Intake --  Output 400 ml  Net -400 ml      08/22/2022    7:49 PM 08/30/2022   12:40 PM 08/05/2022    3:41 PM  Last 3 Weights  Weight (lbs) 197 lb 12 oz 198 lb 198 lb  Weight (kg) 89.7 kg 89.812 kg 89.812 kg      Telemetry    SR with PVCs  - Personally Reviewed  ECG     SR   95 bpm   ST depression in the anterolateral leads (changes present yesterday, new compared to 08/05/22)  - Personally Reviewed  Physical Exam   GEN  Patient breathing comfortably on BiPAP Neck: Neck is full but JVP appears increased  Cardiac: RRR, II/vI  systolic murmur  Respiratory:Decreased BS at R base  Otherwise CTA  GI   No RUQ tenderness  SUpple  MS: s/p R BKA  Tr t o1+ LLE  edema; Neuro:  Nonfocal  Psych: Normal affect   Labs    High Sensitivity Troponin:   Recent Labs  Lab 08/20/2022 1242 09/06/2022 1444 08/17/22 0435  TROPONINIHS 6,646* 10,311* >24,000*     Chemistry Recent Labs  Lab 09/03/2022 1242 08/17/22 0312  NA 128* 128*  K 3.9 4.2  CL 95* 95*  CO2 17* 15*  GLUCOSE 97 80  BUN 15 19  CREATININE 1.39* 1.50*  CALCIUM 8.9 8.7*  MG 1.7  --   PROT  --  5.7*  ALBUMIN  --  3.2*  AST  --  243*  ALT  --  46*  ALKPHOS  --  30*  BILITOT  --  1.6*  GFRNONAA 51* 47*  ANIONGAP 16* 18*    Lipids No results for input(s): "CHOL", "  TRIG", "HDL", "LABVLDL", "LDLCALC", "CHOLHDL" in the last 168 hours.  Hematology Recent Labs  Lab 08/19/2022 1242 08/17/22 0312  WBC 6.7 7.0  RBC 4.19* 4.25  HGB 13.1 13.0  HCT 38.2* 37.2*  MCV 91.2 87.5  MCH 31.3 30.6  MCHC 34.3 34.9  RDW 13.8 14.1  PLT 169 190   Thyroid  Recent Labs  Lab 08/26/2022 1242 08/17/22 0312  TSH 4.866*  --   FREET4  --  1.24*    BNP Recent Labs  Lab 09/05/2022 1251  BNP 879.2*    DDimer  Recent Labs  Lab 08/31/2022 1242  DDIMER 0.91*     Radiology    DG CHEST PORT 1 VIEW  Result Date: 08/17/2022 CLINICAL DATA:  Hypoxia EXAM: PORTABLE CHEST 1 VIEW COMPARISON:  Chest CT and radiograph from yesterday FINDINGS: Edema, pleural effusions, and atelectasis by recent CT. More focal opacity in the right mid chest above the minor fissure since prior. No pneumothorax. Cardiomegaly and vascular pedicle widening is stable. IMPRESSION: 1. Improved pulmonary edema. 2. New and focal opacity in the right mid lung, cannot exclude superimposed pneumonia. Electronically Signed   By: Tiburcio Pea M.D.   On: 08/17/2022 05:06   CT Angio Chest PE W and/or Wo Contrast  Result Date: 08/08/2022 CLINICAL DATA:  Positive D-dimer. Intermediate probability for pulmonary  embolus. EXAM: CT ANGIOGRAPHY CHEST WITH CONTRAST TECHNIQUE: Multidetector CT imaging of the chest was performed using the standard protocol during bolus administration of intravenous contrast. Multiplanar CT image reconstructions and MIPs were obtained to evaluate the vascular anatomy. RADIATION DOSE REDUCTION: This exam was performed according to the departmental dose-optimization program which includes automated exposure control, adjustment of the mA and/or kV according to patient size and/or use of iterative reconstruction technique. CONTRAST:  75mL OMNIPAQUE IOHEXOL 350 MG/ML SOLN COMPARISON:  09/15/2014 FINDINGS: Cardiovascular: The heart size is upper normal to borderline enlarged. No substantial pericardial effusion. Coronary artery calcification is evident. Moderate atherosclerotic calcification is noted in the wall of the thoracic aorta. There is no filling defect within the opacified pulmonary arteries to suggest the presence of an acute pulmonary embolus. Mediastinum/Nodes: No mediastinal lymphadenopathy. There is no hilar lymphadenopathy. The esophagus has normal imaging features. There is no axillary lymphadenopathy. Lungs/Pleura: Bilateral lower lobe collapse/consolidation is associated with small left and moderate right pleural effusions. Patchy ground-glass opacity in the upper lobes and right middle lobe may be atelectasis although component of underlying airspace infection is not excluded. Upper Abdomen: Unremarkable. Musculoskeletal: No worrisome lytic or sclerotic osseous abnormality. Review of the MIP images confirms the above findings. IMPRESSION: 1. No CT evidence for acute pulmonary embolus. 2. Bilateral lower lobe collapse/consolidation with small left and moderate right pleural effusions. 3. Patchy ground-glass opacity in the upper lobes and right middle lobe may be atelectasis although component of underlying airspace infection is not excluded. 4.  Aortic Atherosclerosis (ICD10-I70.0).  Electronically Signed   By: Kennith Center M.D.   On: 08/14/2022 15:20   DG Chest Portable 1 View  Result Date: 08/29/2022 CLINICAL DATA:  Chest pain EXAM: PORTABLE CHEST 1 VIEW COMPARISON:  None Available. FINDINGS: Enlarged cardiopericardial silhouette. There is some linear changes along lung bases. Atelectasis versus subtle infiltrate. Recommend follow-up. No pneumothorax or significant effusion. Calcified aorta. Films under penetrated. Degenerative changes of the spine. IMPRESSION: Enlarged heart. Linear opacity seen along bases. Atelectasis versus subtle infiltrate. Recommend follow-up Electronically Signed   By: Karen Kays M.D.   On: 08/17/2022 13:28    Cardiac Studies  Myoview   May 2024    The study is normal. The study is low risk.   No ST deviation was noted.   LV perfusion is normal. There is no evidence of ischemia. There is no evidence of infarction.   Left ventricular function is normal. End diastolic cavity size is normal. End systolic cavity size is normal.   Prior study not available for comparison.   Normal resting and stress perfusion. No ischemia or infarction EF 62% Echo  Feb 2024   1. Left ventricular ejection fraction, by estimation, is 60 to 65%. The  left ventricle has normal function. The left ventricle has no regional  wall motion abnormalities. There is mild left ventricular hypertrophy.  Left ventricular diastolic parameters  are consistent with Grade I diastolic dysfunction (impaired relaxation).   2. Right ventricular systolic function is normal. The right ventricular  size is normal. There is mildly elevated pulmonary artery systolic  pressure. The estimated right ventricular systolic pressure is 37.3 mmHg.   3. The mitral valve is degenerative. No evidence of mitral valve  regurgitation. No evidence of mitral stenosis.   4. Tricuspid valve regurgitation is moderate.   5. The aortic valve is calcified. There is moderate calcification of the  aortic  valve. There is moderate thickening of the aortic valve. Aortic  valve regurgitation is mild. Mild to moderate aortic valve stenosis.  Aortic regurgitation PHT measures 416  msec. Aortic valve area, by VTI measures 1.27 cm. Aortic valve mean  gradient measures 15.0 mmHg. Aortic valve Vmax measures 2.78 m/s.   6. The inferior vena cava is normal in size with greater than 50%  respiratory variability, suggesting right atrial pressure of 3 mmHg.   Comparison(s): No significant change from prior study. Prior images  reviewed side by side. 04/17/2020: AV mean gradient 18 mmHg.   Patient Profile     Gene [name] is a 82 y.o. 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 Jomarie Longs.   Assessment & Plan    1  NSTEMI   Pt with profound troponin elevation, abnormal since admit then rising    Currently without CP   Breathing is much better now that on BiPAP (though still on 90% FiO2) and after some lasix . Echo ordered  Agree with  R/L heart cath to define anatomy but when patient's breathing is better   Needs to be comfortably when laying flatter  Continue IV heparin    Continue IV lasix   Follow renal function  2.  Pulmonary   Pt is breathing better after lasix and with BiPAP     CXR this am shows more focal R sided lung disease   AGree with ABX  3  Aortic stenosis    Moderate on echo done earlier this year.     4  Renal  Cr 0.81 in May 2024   Now 1.5 this morning   FOllow closely as diuresing with lasix    5  PAF   PResent yesterday    None today   Continue to follow on tele  Pt on heparin    THis is first documented episode for patient    ? If related to his presenation (catechol driven)   Atria normal size on echo in Feb 2024     6  PAD  s/p R BKA    On plavix prior to admit   7 LIpids   LDL 84   HDL 56 (May 2024 )  Pt on fenfibrate and lipitor 80   Will need tighter control  Keep on same RX for Today    For questions or updates, please contact [facility] Please consult [email or web address] for contact info under        Signed, Dietrich Pates, MD  08/17/2022, 8:46 AM

## 2022-08-17 NOTE — Progress Notes (Signed)
Patient continues to complain of increased [facility], patient endorses he can not lie flat. When laying the patient flat, he became nauseous, and diaphoretic and he began vomitting. Patient oxygen saturation is in the low to mid 80s on 60L heated high flow nasal cannula. Rathore MD notified. See new orders

## 2022-08-17 NOTE — Progress Notes (Signed)
Patient placed on heated high flow, 60L 100%.

## 2022-08-17 NOTE — Progress Notes (Signed)
Patient is on bipap with at oxygen saturation of 97%. Patient continue to denies chest pain. Patient troponin levels are >24,000. Rathore MD and  Welton Flakes MD notified and [facility] PA paged.

## 2022-08-17 NOTE — Telephone Encounter (Signed)
Left detailed message (DPR) on home phone and will also send to portal for review.  Gene Hazel, MD  You   I think it is ok to wait but if he has more severe symptoms he should call 911.  Gene [name]

## 2022-08-17 NOTE — Progress Notes (Signed)
Pharmacy Antibiotic Note  Gene [name] is a 82 y.o. male admitted on 08/16/2022 with  aspiration PNA .  Pharmacy has been consulted for Unasyn dosing.  Plan: Unasyn 3g IV q 8 hrs. F/u cultures, renal function and clinical course.  Height: 5\' 9"  (175.3 cm) Weight: 89.7 kg (197 lb 12 oz) IBW/kg (Calculated) : 70.7  Temp (24hrs), Avg:98.1 F (36.7 C), Min:97.6 F (36.4 C), Max:100.1 F (37.8 C)  Recent Labs  Lab 08/16/22 1242 08/17/22 0312  WBC 6.7 7.0  CREATININE 1.39* 1.50*    Estimated Creatinine Clearance: 43.5 mL/min (A) (by C-G formula based on SCr of 1.5 mg/dL (H)).    No Known Allergies  Antimicrobials this admission:  Unasyn 6/10 >   Dose adjustments this admission:   Microbiology results:  6/10 BCx x 2 >  6/10 Resp virus panel >   Thank you for allowing pharmacy to be a part of this patient's care.   Reece Leader, Colon Flattery, BCCP Clinical Pharmacist  08/17/2022 9:01 AM   [facility] pharmacy phone numbers are listed on [email or web address]

## 2022-08-17 NOTE — Progress Notes (Signed)
ANTICOAGULATION CONSULT NOTE   Pharmacy Consult for heparin  Indication: chest pain/ACS  No Known Allergies  Patient Measurements: Height: 5\' 9"  (175.3 cm) Weight: 89.7 kg (197 lb 12 oz) IBW/kg (Calculated) : 70.7 Heparin Dosing Weight: 88.8  Vital Signs: Temp: 97.6 F (36.4 C) (06/10 0808) Temp Source: Oral (06/10 0808) BP: 110/70 (06/10 0808) Pulse Rate: 93 (06/10 0808)  Labs: Recent Labs    08/16/22 1242 08/16/22 1444 08/16/22 2304 08/17/22 0312 08/17/22 0435  HGB 13.1  --   --  13.0  --   HCT 38.2*  --   --  37.2*  --   PLT 169  --   --  190  --   HEPARINUNFRC  --   --  0.63 0.58  --   CREATININE 1.39*  --   --  1.50*  --   TROPONINIHS 6,646* 10,311*  --   --  >24,000*     Estimated Creatinine Clearance: 43.5 mL/min (A) (by C-G formula based on SCr of 1.5 mg/dL (H)).   Medical History: Past Medical History:  Diagnosis Date   Anemia    "just in 2016 when I was bleeding internally" (08/28/2015)   Anxiety    Aortic stenosis    Arthritis    Cervical spondylolysis    Depression    Diabetic ulcer of left foot (HCC) 08/28/2015   pt states he does not have diabetes   Family history of anesthesia complication    "father would start seeing things"   Heart murmur    "related to the aortic stenosis" (08/28/2015)   History of alcohol abuse    History of blood transfusion    "just in 2016 when I was bleeding internally" (08/28/2015)   Hyperlipidemia    high TG   Hypertension    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 EKG showing Afib, not on anticoag PTA. Trop elevation up to >24,000. HgB 13 and PLT 190. Pharmacy consulted to  dose heparin.   Goal of Therapy:  Heparin level 0.3-0.7 units/ml Monitor platelets by anticoagulation protocol: Yes   Plan:  Continue IV Heparin 1100 units/hr. Daily heparin level and CBC. F/u plans for heparin after cath today.  Reece Leader, Colon Flattery, [facility] Clinical Pharmacist  08/17/2022 8:41 AM   [facility] pharmacy phone numbers are listed on [email or web address]

## 2022-08-17 NOTE — Progress Notes (Signed)
PROGRESS NOTE    Gene [name]  ZHY:[id number] DOB: 1941-03-03 DOA: 09/06/2022 PCP: [name], MD  81/M with history of PAD, right BKA, left transmetatarsal amputation, aortic stenosis, dyslipidemia, anxiety, depression, peripheral neuropathy presented to the ED 6/9 with worsening dyspnea on exertion.  Patient reports ongoing dyspnea with activity for few months this is progressively worsened to dyspnea at rest, orthopnea, lower extremity edema -Reports intermittent chest pain for the last few weeks as well. -EMS was called for worsening dyspnea, found to be hypoxic on route, ED Course: Hypoxic, tachycardic, placed on nonrebreather, followed by 6-10LHFNC,  troponin 6.6K> 10.3 K, BNP 879, creatinine 1.3, hemoglobin 13, TSH 4.8, EKG with ST-T wave changes in inferior and lateral leads, CT chest noted bilateral pleural effusions and groundglass opacities -Admitted, started on diuretics, overnight needed BiPAP  Subjective: -BiPAP helping with his breathing  Assessment and Plan:  NSTEMI (non-ST elevated myocardial infarction) (HCC) -Admit to progressive unit -Continue IV heparin, metoprolol, statin, aspirin -Cardiology following, plan for ischemic eval when respiratory status has improved  Acute hypoxic respiratory failure -Primarily from diastolic CHF, repeat x-ray with questionable right lower lobe infiltrate,?  Aspiration pneumonia -Will add IV Unasyn -FU procalcitonin, CBC in a.m.    Acute diastolic CHF Moderate aortic stenosis -Last echo 2/24 with preserved EF, mild to moderate aortic stenosis -BiPAP today, wean FiO2 as tolerated -Continue IV Lasix today, dose increased -Hold losartan and amlodipine with soft BPs -FU repeat echo -Slowly add GDMT as blood pressure tolerates over the next few days   History of BKA Left transmetatarsal amputation   Peripheral neuropathy -Resume Lyrica   Anxiety, depression -Continue benzodiazepine and Lexapro per home regimen    Abnormal TSH -Free T4 minimally elevated, recommend repeat in 6 weeks     DVT prophylaxis: IV heparin Code Status: Changed his mind from DNR to full code last night Family Communication: Discussed with patient and sons at bedside Disposition Plan: Inpatient  Consultants:    Procedures:   Antimicrobials:    Objective: Vitals:   08/17/22 0406 08/17/22 0459 08/17/22 0745 08/17/22 0808  BP: (!) 112/99   110/70  Pulse: 96  96 93  Resp: 20  18 18   Temp: 100.1 F (37.8 C)   97.6 F (36.4 C)  TempSrc: Axillary   Oral  SpO2: (!) 88% 93% 96% 97%  Weight:      Height:        Intake/Output Summary (Last 24 hours) at 08/17/2022 1017 Last data filed at 08/28/2022 2000 Gross per 24 hour  Intake --  Output 400 ml  Net -400 ml   Filed Weights   08/12/2022 1240 09/02/2022 1949  Weight: 89.8 kg 89.7 kg    Examination:  General exam: Obese male sitting up in bed, BiPAP on HEENT: Neck obese unable to assess JVD CVS: S1-S2, regular rhythm Lungs: Decreased breath sounds at the bases Abdomen: Soft, nontender, bowel sounds present Extremities: 1+ edema, right BKA, left transmetatarsal amputation Skin: No rashes Psychiatry:  Mood & affect appropriate.     Data Reviewed:   CBC: Recent Labs  Lab 08/20/2022 1242 08/17/22 0312  WBC 6.7 7.0  HGB 13.1 13.0  HCT 38.2* 37.2*  MCV 91.2 87.5  PLT 169 190   Basic Metabolic Panel: Recent Labs  Lab 08/10/2022 1242 08/17/22 0312  NA 128* 128*  K 3.9 4.2  CL 95* 95*  CO2 17* 15*  GLUCOSE 97 80  BUN 15 19  CREATININE 1.39* 1.50*  CALCIUM 8.9  8.7*  MG 1.7  --    GFR: Estimated Creatinine Clearance: 43.5 mL/min (A) (by C-G formula based on SCr of 1.5 mg/dL (H)). Liver Function Tests: Recent Labs  Lab 08/17/22 0312  AST 243*  ALT 46*  ALKPHOS 30*  BILITOT 1.6*  PROT 5.7*  ALBUMIN 3.2*   No results for input(s): "LIPASE", "AMYLASE" in the last 168 hours. No results for input(s): "AMMONIA" in the last 168  hours. Coagulation Profile: No results for input(s): "INR", "PROTIME" in the last 168 hours. Cardiac Enzymes: No results for input(s): "CKTOTAL", "CKMB", "CKMBINDEX", "TROPONINI" in the last 168 hours. BNP (last 3 results) No results for input(s): "PROBNP" in the last 8760 hours. HbA1C: No results for input(s): "HGBA1C" in the last 72 hours. CBG: No results for input(s): "GLUCAP" in the last 168 hours. Lipid Profile: No results for input(s): "CHOL", "HDL", "LDLCALC", "TRIG", "CHOLHDL", "LDLDIRECT" in the last 72 hours. Thyroid Function Tests: Recent Labs    09/03/22 1242 08/17/22 0312  TSH 4.866*  --   FREET4  --  1.24*   Anemia Panel: No results for input(s): "VITAMINB12", "FOLATE", "FERRITIN", "TIBC", "IRON", "RETICCTPCT" in the last 72 hours. Urine analysis:    Component Value Date/Time   COLORURINE YELLOW 08/24/2018 1335   APPEARANCEUR CLEAR 08/24/2018 1335   LABSPEC >=1.030 (A) 08/24/2018 1335   PHURINE 5.0 08/24/2018 1335   GLUCOSEU NEGATIVE 08/24/2018 1335   HGBUR NEGATIVE 08/24/2018 1335   BILIRUBINUR NEGATIVE 08/24/2018 1335   KETONESUR TRACE (A) 08/24/2018 1335   PROTEINUR NEGATIVE 08/15/2014 1738   UROBILINOGEN 0.2 08/24/2018 1335   NITRITE NEGATIVE 08/24/2018 1335   LEUKOCYTESUR NEGATIVE 08/24/2018 1335   Sepsis Labs: @LABRCNTIP (procalcitonin:4,lacticidven:4)  )No results found for this or any previous visit (from the past 240 hour(s)).   Radiology Studies: DG CHEST PORT 1 VIEW  Result Date: 08/17/2022 CLINICAL DATA:  Hypoxia EXAM: PORTABLE CHEST 1 VIEW COMPARISON:  Chest CT and radiograph from yesterday FINDINGS: Edema, pleural effusions, and atelectasis by recent CT. More focal opacity in the right mid chest above the minor fissure since prior. No pneumothorax. Cardiomegaly and vascular pedicle widening is stable. IMPRESSION: 1. Improved pulmonary edema. 2. New and focal opacity in the right mid lung, cannot exclude superimposed pneumonia. Electronically  Signed   By: Tiburcio Pea M.D.   On: 08/17/2022 05:06   CT Angio Chest PE W and/or Wo Contrast  Result Date: 09-03-22 CLINICAL DATA:  Positive D-dimer. Intermediate probability for pulmonary embolus. EXAM: CT ANGIOGRAPHY CHEST WITH CONTRAST TECHNIQUE: Multidetector CT imaging of the chest was performed using the standard protocol during bolus administration of intravenous contrast. Multiplanar CT image reconstructions and MIPs were obtained to evaluate the vascular anatomy. RADIATION DOSE REDUCTION: This exam was performed according to the departmental dose-optimization program which includes automated exposure control, adjustment of the mA and/or kV according to patient size and/or use of iterative reconstruction technique. CONTRAST:  75mL OMNIPAQUE IOHEXOL 350 MG/ML SOLN COMPARISON:  09/15/2014 FINDINGS: Cardiovascular: The heart size is upper normal to borderline enlarged. No substantial pericardial effusion. Coronary artery calcification is evident. Moderate atherosclerotic calcification is noted in the wall of the thoracic aorta. There is no filling defect within the opacified pulmonary arteries to suggest the presence of an acute pulmonary embolus. Mediastinum/Nodes: No mediastinal lymphadenopathy. There is no hilar lymphadenopathy. The esophagus has normal imaging features. There is no axillary lymphadenopathy. Lungs/Pleura: Bilateral lower lobe collapse/consolidation is associated with small left and moderate right pleural effusions. Patchy ground-glass opacity in the upper lobes and  right middle lobe may be atelectasis although component of underlying airspace infection is not excluded. Upper Abdomen: Unremarkable. Musculoskeletal: No worrisome lytic or sclerotic osseous abnormality. Review of the MIP images confirms the above findings. IMPRESSION: 1. No CT evidence for acute pulmonary embolus. 2. Bilateral lower lobe collapse/consolidation with small left and moderate right pleural effusions. 3.  Patchy ground-glass opacity in the upper lobes and right middle lobe may be atelectasis although component of underlying airspace infection is not excluded. 4.  Aortic Atherosclerosis (ICD10-I70.0). Electronically Signed   By: Kennith Center M.D.   On: 08/22/2022 15:20   DG Chest Portable 1 View  Result Date: 08/17/2022 CLINICAL DATA:  Chest pain EXAM: PORTABLE CHEST 1 VIEW COMPARISON:  None Available. FINDINGS: Enlarged cardiopericardial silhouette. There is some linear changes along lung bases. Atelectasis versus subtle infiltrate. Recommend follow-up. No pneumothorax or significant effusion. Calcified aorta. Films under penetrated. Degenerative changes of the spine. IMPRESSION: Enlarged heart. Linear opacity seen along bases. Atelectasis versus subtle infiltrate. Recommend follow-up Electronically Signed   By: Karen Kays M.D.   On: 08/10/2022 13:28     Scheduled Meds:  aspirin  81 mg Oral Pre-Cath   aspirin EC  81 mg Oral Daily   atorvastatin  80 mg Oral QHS   citalopram  40 mg Oral Daily   clopidogrel  75 mg Oral Daily   cyanocobalamin  2,000 mcg Oral Daily   fenofibrate  160 mg Oral Daily   furosemide  80 mg Intravenous BID   metoprolol tartrate  12.5 mg Oral BID   multivitamin with minerals  1 tablet Oral Daily   oxymetazoline  1 spray Each Nare BID   pregabalin  100 mg Oral TID   pyridOXINE  100 mg Oral Daily   thiamine  50 mg Oral Daily   traZODone  100 mg Oral QHS   Continuous Infusions:  sodium chloride     ampicillin-sulbactam (UNASYN) IV     heparin 1,100 Units/hr (08/17/22 0758)     LOS: 1 day    Time spent:    Zannie Cove, MD [facility]   08/17/2022, 10:17 AM

## 2022-08-17 NOTE — Progress Notes (Signed)
   08/17/22 0459  BiPAP/CPAP/SIPAP  BiPAP/CPAP/SIPAP Pt Type Adult  BiPAP/CPAP/SIPAP V60  Mask Type Full face mask  Mask Size Medium  Set Rate 12 breaths/min  Respiratory Rate 22 breaths/min  IPAP 18 cmH20  EPAP 8 cmH2O  FiO2 (%) 100 %  Minute Ventilation 1  Leak 29  Peak Inspiratory Pressure (PIP) 19  Tidal Volume (Vt) 703  Press High Alarm 30 cmH2O  Press Low Alarm 5 cmH2O  BiPAP/CPAP /SiPAP Vitals  SpO2 93 %

## 2022-08-17 NOTE — Progress Notes (Signed)
Patient received a dose of lasix at 0850 and has only urinated 400 mL during 12hr shift.  Patient is on Bipap at 60% FiO2. Patient denies having chest pain or any SHOB. Patient is sitting upright at 45 degrees in the bed with regular unlabored breathing. Rathore MD updated about urine output over the last twelve hours

## 2022-08-17 NOTE — Progress Notes (Signed)
Overnight progress note  Notified by RN that he has had increasing oxygen requirement overnight and now on 60 L HFNC and still desatting to the low 80s.  Most recent temperature 100.1 F (axillary), heart rate 97, respiratory rate 20, blood pressure 112/99.  Chart reviewed, patient admitted for acute hypoxic respiratory failure in the setting of NSTEMI, acute diastolic CHF, and moderate aortic stenosis.  He is on IV heparin and IV Lasix 40 mg every 12 hours.  Cardiology planning on cardiac catheterization in the morning.  CTA chest done yesterday: "IMPRESSION: 1. No CT evidence for acute pulmonary embolus. 2. Bilateral lower lobe collapse/consolidation with small left and moderate right pleural effusions. 3. Patchy ground-glass opacity in the upper lobes and right middle lobe may be atelectasis although component of underlying airspace infection is not excluded. 4.  Aortic Atherosclerosis (ICD10-I70.0)."  Patient seen and examined at bedside.  Endorsing orthopnea but denies chest pain.  Currently satting in the mid 90s on BiPAP, no respiratory distress.  Heart rate in the 90s.  Rales appreciated on auscultation of the lungs and has peripheral edema on exam.  -Chest x-ray done and showing improved pulmonary edema but new and focal opacity in the right midlung concerning for possible superimposed pneumonia.  -No leukocytosis on labs.  COVID test/RVP ordered.  Start ceftriaxone and doxycycline.  Check procalcitonin.  Blood cultures ordered.  -EKG showing sinus rhythm with QT prolongation, ST changes in anterior leads but poor quality study with baseline wander.  Repeat EKG and troponin pending.  -Continue BiPAP, wean as tolerated.  -Case also discussed with on-call cardiologist Dr. Welton Flakes  -Patient is listed as full code in epic, however, per progress note from daytime physician he is listed as DNR.  I discussed CODE STATUS with the patient and he wishes to be FULL CODE.

## 2022-08-17 NOTE — Progress Notes (Signed)
   Pt is breathing easier compared to earlier this morning  Oxygen is being weaned Has diuresed some today    Cr has bumped to 1.6    Hold furhter lasix for now Echo done.   LVEF is moderately depressed    Will plan  to repeat BMET and troponin in am Contnue heparin NPO after MN for L and R heart cath tomorrow      Signed, Dietrich Pates, MD  08/17/2022, 3:55 PM

## 2022-08-18 ENCOUNTER — Inpatient Hospital Stay (HOSPITAL_COMMUNITY): Payer: Medicare Other

## 2022-08-18 ENCOUNTER — Other Ambulatory Visit: Payer: Self-pay

## 2022-08-18 DIAGNOSIS — I5041 Acute combined systolic (congestive) and diastolic (congestive) heart failure: Secondary | ICD-10-CM

## 2022-08-18 DIAGNOSIS — I5021 Acute systolic (congestive) heart failure: Secondary | ICD-10-CM | POA: Diagnosis not present

## 2022-08-18 DIAGNOSIS — J9601 Acute respiratory failure with hypoxia: Secondary | ICD-10-CM | POA: Diagnosis not present

## 2022-08-18 DIAGNOSIS — I214 Non-ST elevation (NSTEMI) myocardial infarction: Secondary | ICD-10-CM | POA: Diagnosis not present

## 2022-08-18 DIAGNOSIS — I5033 Acute on chronic diastolic (congestive) heart failure: Secondary | ICD-10-CM | POA: Diagnosis not present

## 2022-08-18 DIAGNOSIS — J9602 Acute respiratory failure with hypercapnia: Secondary | ICD-10-CM

## 2022-08-18 LAB — BASIC METABOLIC PANEL
Anion gap: 17 — ABNORMAL HIGH (ref 5–15)
Anion gap: 24 — ABNORMAL HIGH (ref 5–15)
BUN: 34 mg/dL — ABNORMAL HIGH (ref 8–23)
BUN: 47 mg/dL — ABNORMAL HIGH (ref 8–23)
CO2: 19 mmol/L — ABNORMAL LOW (ref 22–32)
CO2: 22 mmol/L (ref 22–32)
Calcium: 8.9 mg/dL (ref 8.9–10.3)
Calcium: 9 mg/dL (ref 8.9–10.3)
Chloride: 96 mmol/L — ABNORMAL LOW (ref 98–111)
Chloride: 91 mmol/L — ABNORMAL LOW (ref 98–111)
Creatinine, Ser: 1.62 mg/dL — ABNORMAL HIGH (ref 0.61–1.24)
Creatinine, Ser: 2.29 mg/dL — ABNORMAL HIGH (ref 0.61–1.24)
GFR, Estimated: 43 mL/min — ABNORMAL LOW (ref >60)
GFR, Estimated: 28 mL/min — ABNORMAL LOW (ref >60)
Glucose, Bld: 90 mg/dL (ref 70–99)
Glucose, Bld: 204 mg/dL — ABNORMAL HIGH (ref 70–99)
Potassium: 3.9 mmol/L (ref 3.5–5.1)
Potassium: 3.6 mmol/L (ref 3.5–5.1)
Sodium: 132 mmol/L — ABNORMAL LOW (ref 135–145)
Sodium: 137 mmol/L (ref 135–145)

## 2022-08-18 LAB — LACTIC ACID, PLASMA
Lactic Acid, Venous: 2.2 mmol/L (ref 0.5–1.9)
Lactic Acid, Venous: 2.8 mmol/L (ref 0.5–1.9)

## 2022-08-18 LAB — POCT I-STAT 7, (LYTES, BLD GAS, ICA,H+H)
Acid-base deficit: 11 mmol/L — ABNORMAL HIGH (ref 0.0–2.0)
Acid-base deficit: 9 mmol/L — ABNORMAL HIGH (ref 0.0–2.0)
Bicarbonate: 14.4 mmol/L — ABNORMAL LOW (ref 20.0–28.0)
Bicarbonate: 13.6 mmol/L — ABNORMAL LOW (ref 20.0–28.0)
Calcium, Ion: 1.18 mmol/L (ref 1.15–1.40)
Calcium, Ion: 1.18 mmol/L (ref 1.15–1.40)
HCT: 37 % — ABNORMAL LOW (ref 39.0–52.0)
HCT: 37 % — ABNORMAL LOW (ref 39.0–52.0)
Hemoglobin: 12.6 g/dL — ABNORMAL LOW (ref 13.0–17.0)
Hemoglobin: 12.6 g/dL — ABNORMAL LOW (ref 13.0–17.0)
O2 Saturation: 89 %
O2 Saturation: 97 %
Patient temperature: 98.3
Patient temperature: 98.4
Potassium: 4.2 mmol/L (ref 3.5–5.1)
Potassium: 3.9 mmol/L (ref 3.5–5.1)
Sodium: 131 mmol/L — ABNORMAL LOW (ref 135–145)
Sodium: 131 mmol/L — ABNORMAL LOW (ref 135–145)
TCO2: 15 mmol/L — ABNORMAL LOW (ref 22–32)
TCO2: 14 mmol/L — ABNORMAL LOW (ref 22–32)
pCO2 arterial: 29.3 mmHg — ABNORMAL LOW (ref 32–48)
pCO2 arterial: 21.7 mmHg — ABNORMAL LOW (ref 32–48)
pH, Arterial: 7.297 — ABNORMAL LOW (ref 7.35–7.45)
pH, Arterial: 7.405 (ref 7.35–7.45)
pO2, Arterial: 60 mmHg — ABNORMAL LOW (ref 83–108)
pO2, Arterial: 91 mmHg (ref 83–108)

## 2022-08-18 LAB — BLOOD CULTURE ID PANEL (REFLEXED) - BCID2

## 2022-08-18 LAB — CBC
HCT: 35.8 % — ABNORMAL LOW (ref 39.0–52.0)
Hemoglobin: 12.5 g/dL — ABNORMAL LOW (ref 13.0–17.0)
MCH: 31.3 pg (ref 26.0–34.0)
MCHC: 34.9 g/dL (ref 30.0–36.0)
MCV: 89.5 fL (ref 80.0–100.0)
Platelets: 170 10*3/uL (ref 150–400)
RBC: 4 MIL/uL — ABNORMAL LOW (ref 4.22–5.81)
RDW: 14.3 % (ref 11.5–15.5)
WBC: 7.2 10*3/uL (ref 4.0–10.5)
nRBC: 0 % (ref 0.0–0.2)

## 2022-08-18 LAB — COOXEMETRY PANEL
Carboxyhemoglobin: 1.7 % — ABNORMAL HIGH (ref 0.5–1.5)
Methemoglobin: 1.1 % (ref 0.0–1.5)
O2 Saturation: 73.5 %
Total hemoglobin: 12.6 g/dL (ref 12.0–16.0)

## 2022-08-18 LAB — TROPONIN I (HIGH SENSITIVITY): Troponin I (High Sensitivity): 19420 ng/L (ref <18)

## 2022-08-18 LAB — CULTURE, BLOOD (ROUTINE X 2)

## 2022-08-18 LAB — CULTURE, RESPIRATORY W GRAM STAIN

## 2022-08-18 LAB — MRSA NEXT GEN BY PCR, NASAL: MRSA by PCR Next Gen: NOT DETECTED

## 2022-08-18 LAB — GLUCOSE, CAPILLARY: Glucose-Capillary: 158 mg/dL — ABNORMAL HIGH (ref 70–99)

## 2022-08-18 LAB — HEPARIN LEVEL (UNFRACTIONATED): Heparin Unfractionated: 0.54 IU/mL (ref 0.30–0.70)

## 2022-08-18 MED ORDER — SODIUM CHLORIDE 0.9 % IV SOLN: INTRAVENOUS | Status: DC

## 2022-08-18 MED ORDER — ROCURONIUM BROMIDE 10 MG/ML (PF) SYRINGE
PREFILLED_SYRINGE | INTRAVENOUS | Status: AC
  Administered 2022-08-18: 100 mg via INTRAVENOUS
  Filled 2022-08-18: qty 10

## 2022-08-18 MED ORDER — CHLORHEXIDINE GLUCONATE CLOTH 2 % EX PADS
6.0000 | MEDICATED_PAD | Freq: Every day | CUTANEOUS | Status: DC
  Administered 2022-08-18 – 2022-08-20 (×4): 6 via TOPICAL

## 2022-08-18 MED ORDER — ROCURONIUM BROMIDE 50 MG/5ML IV SOLN
100.0000 mg | Freq: Once | INTRAVENOUS | Status: AC
  Administered 2022-08-18: 100 mg via INTRAVENOUS
  Filled 2022-08-18: qty 10

## 2022-08-18 MED ORDER — NOREPINEPHRINE 4 MG/250ML-% IV SOLN
INTRAVENOUS | Status: AC
  Filled 2022-08-18: qty 250

## 2022-08-18 MED ORDER — PHENYLEPHRINE 80 MCG/ML (10ML) SYRINGE FOR IV PUSH (FOR BLOOD PRESSURE SUPPORT)
PREFILLED_SYRINGE | INTRAVENOUS | Status: AC
  Administered 2022-08-18: 400 ug
  Filled 2022-08-18: qty 10

## 2022-08-18 MED ORDER — DEXMEDETOMIDINE HCL IN NACL 400 MCG/100ML IV SOLN
0.0000 ug/kg/h | INTRAVENOUS | Status: DC
  Administered 2022-08-18: 1 ug/kg/h via INTRAVENOUS
  Administered 2022-08-18: 0.4 ug/kg/h via INTRAVENOUS
  Administered 2022-08-18: 0.8 ug/kg/h via INTRAVENOUS
  Administered 2022-08-19 (×4): 0.9 ug/kg/h via INTRAVENOUS
  Administered 2022-08-19: 1 ug/kg/h via INTRAVENOUS
  Administered 2022-08-20 (×5): 1.2 ug/kg/h via INTRAVENOUS
  Filled 2022-08-18 (×13): qty 100

## 2022-08-18 MED ORDER — ALPRAZOLAM 0.5 MG PO TABS: 0.5000 mg | ORAL_TABLET | Freq: Three times a day (TID) | ORAL | Status: DC | PRN

## 2022-08-18 MED ORDER — PREGABALIN 50 MG PO CAPS
100.0000 mg | ORAL_CAPSULE | Freq: Three times a day (TID) | ORAL | Status: DC
  Administered 2022-08-19 – 2022-08-20 (×6): 100 mg
  Filled 2022-08-18 (×6): qty 2

## 2022-08-18 MED ORDER — ROCURONIUM BROMIDE 10 MG/ML (PF) SYRINGE
PREFILLED_SYRINGE | INTRAVENOUS | Status: AC
  Filled 2022-08-18: qty 10

## 2022-08-18 MED ORDER — FENTANYL CITRATE PF 50 MCG/ML IJ SOSY
25.0000 ug | PREFILLED_SYRINGE | INTRAMUSCULAR | Status: DC | PRN
  Administered 2022-08-18: 100 ug via INTRAVENOUS
  Administered 2022-08-19 (×2): 50 ug via INTRAVENOUS
  Administered 2022-08-19: 100 ug via INTRAVENOUS
  Administered 2022-08-19 (×2): 50 ug via INTRAVENOUS
  Administered 2022-08-19: 100 ug via INTRAVENOUS
  Administered 2022-08-19 – 2022-08-20 (×2): 50 ug via INTRAVENOUS
  Administered 2022-08-20 (×3): 100 ug via INTRAVENOUS
  Administered 2022-08-20 (×2): 50 ug via INTRAVENOUS
  Filled 2022-08-18: qty 2
  Filled 2022-08-18 (×3): qty 1
  Filled 2022-08-18 (×3): qty 2
  Filled 2022-08-18 (×2): qty 1
  Filled 2022-08-18 (×3): qty 2
  Filled 2022-08-18: qty 1
  Filled 2022-08-18 (×3): qty 2

## 2022-08-18 MED ORDER — ORAL CARE MOUTH RINSE
15.0000 mL | OROMUCOSAL | Status: DC
  Administered 2022-08-18 (×6): 15 mL via OROMUCOSAL

## 2022-08-18 MED ORDER — ORAL CARE MOUTH RINSE: 15.0000 mL | OROMUCOSAL | Status: DC | PRN

## 2022-08-18 MED ORDER — VITAMIN B-12 1000 MCG PO TABS
2000.0000 ug | ORAL_TABLET | Freq: Every day | ORAL | Status: DC
  Administered 2022-08-19 – 2022-08-20 (×2): 2000 ug
  Filled 2022-08-18 (×2): qty 2

## 2022-08-18 MED ORDER — ETOMIDATE 2 MG/ML IV SOLN
INTRAVENOUS | Status: AC
  Filled 2022-08-18: qty 20

## 2022-08-18 MED ORDER — VITAMIN B-6 100 MG PO TABS
100.0000 mg | ORAL_TABLET | Freq: Every day | ORAL | Status: DC
  Administered 2022-08-19 – 2022-08-20 (×2): 100 mg
  Filled 2022-08-18 (×3): qty 1

## 2022-08-18 MED ORDER — FENTANYL CITRATE PF 50 MCG/ML IJ SOSY: 25.0000 ug | PREFILLED_SYRINGE | INTRAMUSCULAR | Status: DC | PRN

## 2022-08-18 MED ORDER — DOCUSATE SODIUM 50 MG/5ML PO LIQD
100.0000 mg | Freq: Two times a day (BID) | ORAL | Status: DC
  Administered 2022-08-19 – 2022-08-20 (×4): 100 mg
  Filled 2022-08-18 (×5): qty 10

## 2022-08-18 MED ORDER — CITALOPRAM HYDROBROMIDE 20 MG PO TABS
40.0000 mg | ORAL_TABLET | Freq: Every day | ORAL | Status: DC
  Administered 2022-08-19 – 2022-08-20 (×2): 40 mg
  Filled 2022-08-18 (×3): qty 2

## 2022-08-18 MED ORDER — PANTOPRAZOLE SODIUM 40 MG IV SOLR: 40.0000 mg | Freq: Every day | INTRAVENOUS | Status: DC

## 2022-08-18 MED ORDER — NOREPINEPHRINE 16 MG/250ML-% IV SOLN
0.0000 ug/min | INTRAVENOUS | Status: DC
  Administered 2022-08-18: 30 ug/min via INTRAVENOUS
  Administered 2022-08-19: 12 ug/min via INTRAVENOUS
  Administered 2022-08-20: 17 ug/min via INTRAVENOUS
  Administered 2022-08-20: 28 ug/min via INTRAVENOUS
  Administered 2022-08-21 (×2): 40 ug/min via INTRAVENOUS
  Filled 2022-08-18 (×6): qty 250

## 2022-08-18 MED ORDER — POLYETHYLENE GLYCOL 3350 17 G PO PACK
17.0000 g | PACK | Freq: Every day | ORAL | Status: DC
  Administered 2022-08-19 – 2022-08-20 (×2): 17 g
  Filled 2022-08-18 (×3): qty 1

## 2022-08-18 MED ORDER — ASPIRIN 81 MG PO CHEW
81.0000 mg | CHEWABLE_TABLET | Freq: Every day | ORAL | Status: DC
  Administered 2022-08-19 – 2022-08-20 (×2): 81 mg
  Filled 2022-08-18 (×3): qty 1

## 2022-08-18 MED ORDER — ETOMIDATE 2 MG/ML IV SOLN
INTRAVENOUS | Status: AC
  Administered 2022-08-18: 20 mg via INTRAVENOUS
  Filled 2022-08-18: qty 20

## 2022-08-18 MED ORDER — SODIUM BICARBONATE 8.4 % IV SOLN
INTRAVENOUS | Status: AC
  Filled 2022-08-18: qty 100

## 2022-08-18 MED ORDER — VASOPRESSIN 20 UNITS/100 ML INFUSION FOR SHOCK
0.0000 [IU]/min | INTRAVENOUS | Status: DC
  Administered 2022-08-18: 0.02 [IU]/min via INTRAVENOUS
  Administered 2022-08-19 – 2022-08-20 (×5): 0.03 [IU]/min via INTRAVENOUS
  Filled 2022-08-18 (×8): qty 100

## 2022-08-18 MED ORDER — CALCIUM GLUCONATE-NACL 2-0.675 GM/100ML-% IV SOLN
2.0000 g | Freq: Once | INTRAVENOUS | Status: AC
  Administered 2022-08-18: 2000 mg via INTRAVENOUS
  Filled 2022-08-18: qty 100

## 2022-08-18 MED ORDER — ETOMIDATE 2 MG/ML IV SOLN
20.0000 mg | Freq: Once | INTRAVENOUS | Status: AC
  Administered 2022-08-18: 20 mg via INTRAVENOUS

## 2022-08-18 MED ORDER — PANTOPRAZOLE SODIUM 40 MG IV SOLR
40.0000 mg | Freq: Two times a day (BID) | INTRAVENOUS | Status: DC
  Administered 2022-08-18 – 2022-08-20 (×6): 40 mg via INTRAVENOUS
  Filled 2022-08-18 (×5): qty 10

## 2022-08-18 MED ORDER — TRAZODONE HCL 50 MG PO TABS
100.0000 mg | ORAL_TABLET | Freq: Every day | ORAL | Status: DC
  Administered 2022-08-19 – 2022-08-20 (×2): 100 mg
  Filled 2022-08-18 (×2): qty 2

## 2022-08-18 MED ORDER — ACETAMINOPHEN 325 MG PO TABS
650.0000 mg | ORAL_TABLET | Freq: Four times a day (QID) | ORAL | Status: DC | PRN
  Administered 2022-08-20 (×3): 650 mg
  Filled 2022-08-18 (×3): qty 2

## 2022-08-18 MED ORDER — NOREPINEPHRINE 4 MG/250ML-% IV SOLN: 2.0000 ug/min | INTRAVENOUS | Status: DC

## 2022-08-18 MED ORDER — CLOPIDOGREL BISULFATE 75 MG PO TABS
75.0000 mg | ORAL_TABLET | Freq: Every day | ORAL | Status: DC
  Filled 2022-08-18: qty 1

## 2022-08-18 MED ORDER — CALCIUM CHLORIDE 10 % IV SOLN
INTRAVENOUS | Status: AC
  Filled 2022-08-18: qty 10

## 2022-08-18 MED ORDER — FUROSEMIDE 10 MG/ML IJ SOLN
80.0000 mg | Freq: Once | INTRAMUSCULAR | Status: AC
  Administered 2022-08-18: 80 mg via INTRAVENOUS
  Filled 2022-08-18: qty 8

## 2022-08-18 MED ORDER — NOREPINEPHRINE 4 MG/250ML-% IV SOLN
INTRAVENOUS | Status: AC
  Administered 2022-08-18: 10 ug/min via INTRAVENOUS
  Filled 2022-08-18: qty 250

## 2022-08-18 MED ORDER — SODIUM BICARBONATE 8.4 % IV SOLN
INTRAVENOUS | Status: AC
  Administered 2022-08-18: 200 meq via INTRAVENOUS
  Filled 2022-08-18: qty 200

## 2022-08-18 MED ORDER — THIAMINE HCL 100 MG PO TABS
50.0000 mg | ORAL_TABLET | Freq: Every day | ORAL | Status: DC
  Administered 2022-08-19: 50 mg
  Filled 2022-08-18 (×2): qty 1

## 2022-08-18 MED ORDER — FUROSEMIDE 10 MG/ML IJ SOLN: 40.0000 mg | Freq: Once | INTRAMUSCULAR | Status: DC

## 2022-08-18 MED ORDER — ATORVASTATIN CALCIUM 80 MG PO TABS
80.0000 mg | ORAL_TABLET | Freq: Every day | ORAL | Status: DC
  Administered 2022-08-19 – 2022-08-20 (×2): 80 mg
  Filled 2022-08-18 (×2): qty 1

## 2022-08-18 MED ORDER — SODIUM CHLORIDE 0.9 % IV SOLN
250.0000 mL | INTRAVENOUS | Status: DC
  Administered 2022-08-18 – 2022-08-19 (×2): 250 mL via INTRAVENOUS

## 2022-08-18 MED ORDER — ORAL CARE MOUTH RINSE
15.0000 mL | OROMUCOSAL | Status: DC
  Administered 2022-08-19 – 2022-08-20 (×23): 15 mL via OROMUCOSAL

## 2022-08-18 MED ORDER — STERILE WATER FOR INJECTION IJ SOLN
50.0000 ng/kg/min | INTRAVENOUS | Status: DC
  Administered 2022-08-18 – 2022-08-21 (×11): 50 ng/kg/min via RESPIRATORY_TRACT
  Filled 2022-08-18 (×15): qty 5

## 2022-08-18 MED ORDER — PIPERACILLIN-TAZOBACTAM 3.375 G IVPB
3.3750 g | Freq: Three times a day (TID) | INTRAVENOUS | Status: DC
  Administered 2022-08-18 – 2022-08-21 (×9): 3.375 g via INTRAVENOUS
  Filled 2022-08-18 (×10): qty 50

## 2022-08-18 MED ORDER — SODIUM BICARBONATE 8.4 % IV SOLN: 200.0000 meq | Freq: Once | INTRAVENOUS | Status: AC

## 2022-08-18 MED ORDER — DEXMEDETOMIDINE HCL IN NACL 400 MCG/100ML IV SOLN
INTRAVENOUS | Status: AC
  Filled 2022-08-18: qty 100

## 2022-08-18 MED ORDER — SODIUM BICARBONATE 8.4 % IV SOLN
100.0000 meq | Freq: Once | INTRAVENOUS | Status: AC
  Administered 2022-08-18: 100 meq via INTRAVENOUS

## 2022-08-18 NOTE — Progress Notes (Signed)
Pharmacy Antibiotic Note  Gene [name] is a 82 y.o. male admitted on 08/16/2022 with  aspiration PNA . He is on Unasyn with deasturation on 6/11 and plans are to change to zosyn  Plan: Zosyn 3,375gm  IV q 8 hrs. F/u cultures, renal function and clinical course.  Height: 5\' 9"  (175.3 cm) Weight: 89.7 kg (197 lb 12 oz) IBW/kg (Calculated) : 70.7  Temp (24hrs), Avg:98.8 F (37.1 C), Min:98.3 F (36.8 C), Max:99.3 F (37.4 C)  Recent Labs  Lab 08/16/22 1242 08/17/22 0312 08/17/22 1437 08/18/22 0246  WBC 6.7 7.0  --  7.2  CREATININE 1.39* 1.50* 1.62* 1.62*     Estimated Creatinine Clearance: 40.3 mL/min (A) (by C-G formula based on SCr of 1.62 mg/dL (H)).    No Known Allergies  Antimicrobials this admission:  Unasyn 6/10 >   Dose adjustments this admission:   Microbiology results:  6/10 BCx x 2 > staph epi in 1/4 bottles, likely contaminant 6/10 Resp virus panel > neg  Thank you for allowing pharmacy to be a part of this patient's care.  Harland German, PharmD Clinical Pharmacist **Pharmacist phone directory can now be found on [email or web address] ([email or web address]).  Listed under [facility].

## 2022-08-18 NOTE — Progress Notes (Signed)
Patient is on Bipap with FiO2 OF 100% with a saturation of 87-88. Cath pending. Cath lab notified. Xika PA en route to bedside.   Patient states he had a little SHOB that resolved with repositioning and he stated he had chest pain located mid sternal that last for 2 min. He rated it 2/10.  Patient doesn't appear to have labored and he remains alert and orient x4.

## 2022-08-18 NOTE — Procedures (Signed)
Intubation Procedure Note  JAYME CHAM  [id number]  1940/04/21  Date:08/18/22  Time:12:28 PM   Provider Performing:Bralen Wiltgen    Procedure: Intubation (31500)  Indication(s) Respiratory Failure  Consent Risks of the procedure as well as the alternatives and risks of each were explained to the patient and/or caregiver.  Consent for the procedure was obtained and is signed in the bedside chart   Anesthesia Etomidate and Rocuronium   Time Out Verified patient identification, verified procedure, site/side was marked, verified correct patient position, special equipment/implants available, medications/allergies/relevant history reviewed, required imaging and test results available.   Sterile Technique Usual hand hygeine, masks, and gloves were used   Procedure Description Patient positioned in bed supine.  Sedation given as noted above.  Patient was intubated with endotracheal tube using  MAC4 .  View was Grade 1 full glottis .  Number of attempts was 1.  Colorimetric CO2 detector was consistent with tracheal placement.   Complications/Tolerance None; patient tolerated the procedure well. Chest X-ray is ordered to verify placement.   EBL Minimal   Specimen(s) None

## 2022-08-18 NOTE — Progress Notes (Signed)
PHARMACY - PHYSICIAN COMMUNICATION CRITICAL VALUE ALERT - BLOOD CULTURE IDENTIFICATION (BCID)  Gene [name] is an 82 y.o. male who presented to [facility] on 08/16/2022 with a chief complaint of chest pain.  Assessment:  Patient blood cultures growing staph epidermis with no gene resistance in 1/4 bottles. Patient is currently on Unasyn for aspiration pneumonia. WBC 7, temp of 100.1 on 6/10 AM afebrile since, sCr 1.62  Name of physician (or Provider) Contacted: Dr. Loney Loh  Current antibiotics: Unasyn 3g IV every 8 hours   Changes to prescribed antibiotics recommended:  -Continue Unasyn for Asp. PNA, no additional agent needed.  Results for orders placed or performed during the hospital encounter of 08/16/22  Blood Culture ID Panel (Reflexed) (Collected: 08/17/2022  5:19 AM)  Result Value Ref Range   Enterococcus faecalis NOT DETECTED NOT DETECTED   Enterococcus Faecium NOT DETECTED NOT DETECTED   Listeria monocytogenes NOT DETECTED NOT DETECTED   Staphylococcus species DETECTED (A) NOT DETECTED   Staphylococcus aureus (BCID) NOT DETECTED NOT DETECTED   Staphylococcus epidermidis DETECTED (A) NOT DETECTED   Staphylococcus lugdunensis NOT DETECTED NOT DETECTED   Streptococcus species NOT DETECTED NOT DETECTED   Streptococcus agalactiae NOT DETECTED NOT DETECTED   Streptococcus pneumoniae NOT DETECTED NOT DETECTED   Streptococcus pyogenes NOT DETECTED NOT DETECTED   A.calcoaceticus-baumannii NOT DETECTED NOT DETECTED   Bacteroides fragilis NOT DETECTED NOT DETECTED   Enterobacterales NOT DETECTED NOT DETECTED   Enterobacter cloacae complex NOT DETECTED NOT DETECTED   Escherichia coli NOT DETECTED NOT DETECTED   Klebsiella aerogenes NOT DETECTED NOT DETECTED   Klebsiella oxytoca NOT DETECTED NOT DETECTED   Klebsiella pneumoniae NOT DETECTED NOT DETECTED   Proteus species NOT DETECTED NOT DETECTED   Salmonella species NOT DETECTED NOT DETECTED   Serratia marcescens NOT  DETECTED NOT DETECTED   Haemophilus influenzae NOT DETECTED NOT DETECTED   Neisseria meningitidis NOT DETECTED NOT DETECTED   Pseudomonas aeruginosa NOT DETECTED NOT DETECTED   Stenotrophomonas maltophilia NOT DETECTED NOT DETECTED   Candida albicans NOT DETECTED NOT DETECTED   Candida auris NOT DETECTED NOT DETECTED   Candida glabrata NOT DETECTED NOT DETECTED   Candida krusei NOT DETECTED NOT DETECTED   Candida parapsilosis NOT DETECTED NOT DETECTED   Candida tropicalis NOT DETECTED NOT DETECTED   Cryptococcus neoformans/gattii NOT DETECTED NOT DETECTED   Methicillin resistance mecA/C NOT DETECTED NOT DETECTED    Arabella Merles, PharmD. [facility] Acute Care PGY-1  08/18/2022 2:47 AM

## 2022-08-18 NOTE — Plan of Care (Signed)
PCCM:  Able to speak with patient's son, Kalven Ganim [phone]). Updated him re: patient's clinical status and transfer to ICU. On ICU arrival, Mr. Weld appeared diaphoretic, tachypneic and fatigued on BiPAP at FiO2 100% with SpO2 mid-80s at best. Decision made to intubate. Patient's son informed and in agreement, he and other family members will be coming to the hospital later today.  Tim Lair, PA-C [facility] 08/18/22 9:36 AM  Please see [email or web address] for pager details.  From 7A-7P if no response, please call [phone] After hours, please call ELink [phone]

## 2022-08-18 NOTE — Progress Notes (Addendum)
PCCM interval progress note  Patient was transferred to ICU, after that he was noted to be diaphoretic, hypoxic to low 80s, after discussion with patient and his son decision was to proceed with endotracheal intubation and mechanical ventilation, please see separate procedure note  Postprocedure patient remained hypoxic in mid 80s despite 100% FiO2 and PEEP was increased to 12, x-ray chest was done which showed large right-sided infiltrate consistent with pneumonia.  Patient was started on IV Unasyn  ABG was done which showed pH 7.29, pCO2 29 and pO2 60 with oxygen saturation 89% on 100% FiO2  Epoprostenol was started, with improvement in oxygen saturation  Post tPA patient started getting hypotensive with MAP in 70s, was started on Levophed, it was slowly titrated up, currently on 30 mics of levo.  Central line was placed, please see separate procedure note  Also patient was noted to have coffee-ground emesis, NG tube was placed on low intermittent wall suction Started on IV Protonix every 12.  Will recheck hemoglobin if continues to drop, will try to stop heparin infusion.  Patient's family was updated at bedside.  Decision was to continue full scope of care    Additional critical care time spent 42 minutes  Cheri Fowler, MD

## 2022-08-18 NOTE — Progress Notes (Signed)
Bipap patient transported from 6E to 2H without any issues.

## 2022-08-18 NOTE — Procedures (Signed)
Central Venous Catheter Insertion Procedure Note  Gene [name]  [id number]  [birth year]  Date:08/18/22  Time:12:29 PM   Provider Performing:Jeraldin Fesler   Procedure: Insertion of Non-tunneled Central Venous [id number]) with US guidance ([id number])   Indication(s) Medication administration  Consent Risks of the procedure as well as the alternatives and risks of each were explained to the patient and/or caregiver.  Consent for the procedure was obtained and is signed in the bedside chart  Anesthesia Topical only with 1% lidocaine   Timeout Verified patient identification, verified procedure, site/side was marked, verified correct patient position, special equipment/implants available, medications/allergies/relevant history reviewed, required imaging and test results available.  Sterile Technique Maximal sterile technique including full sterile barrier drape, hand hygiene, sterile gown, sterile gloves, mask, hair covering, sterile ultrasound probe cover (if used).  Procedure Description Area of catheter insertion was cleaned with chlorhexidine and draped in sterile fashion.  With real-time ultrasound guidance a central venous catheter was placed into the left subclavian vein. Nonpulsatile blood flow and easy flushing noted in all ports.  The catheter was sutured in place and sterile dressing applied.  Complications/Tolerance None; patient tolerated the procedure well. Chest X-ray is ordered to verify placement for internal jugular or subclavian cannulation.   Chest x-ray is not ordered for femoral cannulation.  EBL Minimal  Specimen(s) None

## 2022-08-18 NOTE — Progress Notes (Addendum)
Rounding Note    Patient Name: Gene [name] Date of Encounter: 08/18/2022  [facility] Cardiologist: Verne Carrow, MD   Subjective   He feels more SOB this morning, nurse states his hypoxia is worsened since 630am this morning, need to increase FIO2 from 40 to 100% but he remains at 88-89%. He c/o nasal stuffiness and dry mouth. He has been on BIPAP for 24 hours yesterday, initially got better. He denied chest pain or dizziness. He is net - since admission. He states he accepts intubation if needed, wishes his son Molli Hazard to be his power of attorney.  He denies any known lung disease such as asthma, COPD, ILD, tobacco use.    Inpatient Medications    Scheduled Meds:  aspirin  81 mg Oral Once   aspirin EC  81 mg Oral Daily   atorvastatin  80 mg Oral QHS   citalopram  40 mg Oral Daily   clopidogrel  75 mg Oral Daily   cyanocobalamin  2,000 mcg Oral Daily   fenofibrate  160 mg Oral Daily   furosemide  80 mg Intravenous Once   multivitamin with minerals  1 tablet Oral Daily   oxymetazoline  1 spray Each Nare BID   pregabalin  100 mg Oral TID   pyridOXINE  100 mg Oral Daily   thiamine  50 mg Oral Daily   traZODone  100 mg Oral QHS   Continuous Infusions:  sodium chloride     ampicillin-sulbactam (UNASYN) IV Stopped (08/18/22 0155)   heparin 1,100 Units/hr (08/18/22 0656)   PRN Meds: acetaminophen, ALPRAZolam   Vital Signs    Vitals:   08/18/22 0102 08/18/22 0110 08/18/22 0120 08/18/22 0454  BP: 109/77   95/75  Pulse: (!) 111 (!) 108  (!) 106  Resp:    18  Temp:    98.8 F (37.1 C)  TempSrc:    Axillary  SpO2:  92% 94% 92%  Weight:      Height:        Intake/Output Summary (Last 24 hours) at 08/18/2022 0806 Last data filed at 08/18/2022 0739 Gross per 24 hour  Intake 768.88 ml  Output 1150 ml  Net -381.12 ml      08/19/2022    7:49 PM 08/11/2022   12:40 PM 08/05/2022    3:41 PM  Last 3 Weights  Weight (lbs) 197 lb 12 oz 198 lb  198 lb  Weight (kg) 89.7 kg 89.812 kg 89.812 kg      Telemetry    Atrial tachycardia 110-120s - Personally Reviewed  ECG    EKG ordered today  - Personally Reviewed  Physical Exam   GEN: No acute distress.   Neck: JVD elevated, BIPAP strap in place to interrupt exam  Cardiac: RRR, grade II systolic murmur   Respiratory: Coarse crackles bilaterally, on BIPAP, 100% FIO2, pox 85-89%, able to speak full sentence, no significant dyspnea at rest  GI: Soft, nontender, non-distended  MS: s/p R BKA, LLE no edema  Neuro:  Nonfocal  Psych: Normal affect   Labs    High Sensitivity Troponin:   Recent Labs  Lab 08/09/2022 1242 08/20/2022 1444 08/17/22 0435  TROPONINIHS 6,646* 10,311* >24,000*     Chemistry Recent Labs  Lab 08/12/2022 1242 08/17/22 0312 08/17/22 1437 08/18/22 0246  NA 128* 128* 129* 132*  K 3.9 4.2 4.3 3.9  CL 95* 95* 93* 96*  CO2 17* 15* 17* 19*  GLUCOSE 97 80 95 90  BUN  15 19 27* 34*  CREATININE 1.39* 1.50* 1.62* 1.62*  CALCIUM 8.9 8.7* 9.1 8.9  MG 1.7  --   --   --   PROT  --  5.7*  --   --   ALBUMIN  --  3.2*  --   --   AST  --  243*  --   --   ALT  --  46*  --   --   ALKPHOS  --  30*  --   --   BILITOT  --  1.6*  --   --   GFRNONAA 51* 47* 43* 43*  ANIONGAP 16* 18* 19* 17*    Lipids No results for input(s): "CHOL", "TRIG", "HDL", "LABVLDL", "LDLCALC", "CHOLHDL" in the last 168 hours.  Hematology Recent Labs  Lab 08/30/2022 1242 08/17/22 0312 08/18/22 0246  WBC 6.7 7.0 7.2  RBC 4.19* 4.25 4.00*  HGB 13.1 13.0 12.5*  HCT 38.2* 37.2* 35.8*  MCV 91.2 87.5 89.5  MCH 31.3 30.6 31.3  MCHC 34.3 34.9 34.9  RDW 13.8 14.1 14.3  PLT 169 190 170   Thyroid  Recent Labs  Lab 08/09/2022 1242 08/17/22 0312  TSH 4.866*  --   FREET4  --  1.24*    BNP Recent Labs  Lab 08/17/2022 1251  BNP 879.2*    DDimer  Recent Labs  Lab 09/04/2022 1242  DDIMER 0.91*     Radiology    [place] EKG SITE RITE  Result Date: 08/18/2022 If Site Rite image not attached,  placement could not be confirmed due to current cardiac rhythm.  ECHOCARDIOGRAM COMPLETE  Result Date: 08/17/2022    ECHOCARDIOGRAM REPORT   Patient Name:   SHAMEL GERMOND [name] Date of Exam: 08/17/2022 Medical Rec #:  [id number]            Height:       69.0 in Accession #:    [id number]           Weight:       197.8 lb Date of Birth:  1940-03-13            BSA:          2.056 m Patient Age:    81 years             BP:           96/54 mmHg Patient Gender: M                    HR:           74 bpm. Exam Location:  Inpatient Procedure: 2D Echo, Cardiac Doppler, Color Doppler and Intracardiac            Opacification Agent Indications:    CHF - Acute Systolic  History:        Patient has prior history of Echocardiogram examinations, most                 recent 04/13/2022. CHF, Previous Myocardial Infarction, PAD and                 Carotid Disease, Signs/Symptoms:Dyspnea, Edema and Chest Pain;                 Risk Factors:Dyslipidemia and Hypertension.  Sonographer:    Wallie Char Referring Phys: [id number] PREETHA JOSEPH  Sonographer Comments: Technically difficult study due to poor echo windows. IMPRESSIONS  1. Left ventricular ejection fraction, by estimation, is 35%. The left ventricle has moderately  decreased function. The left ventricle demonstrates global hypokinesis. There is mild concentric left ventricular hypertrophy. Left ventricular diastolic parameters are consistent with Grade II diastolic dysfunction (pseudonormalization). Elevated left atrial pressure.  2. Right ventricular systolic function is normal. The right ventricular size is normal. There is mildly elevated pulmonary artery systolic pressure. The estimated right ventricular systolic pressure is 44.2 mmHg.  3. Left atrial size was severely dilated.  4. Right atrial size was mildly dilated.  5. The mitral valve is degenerative. Trivial mitral valve regurgitation.  6. The aortic valve is not well visualized but appears severely calcified and  thickened. There is at least mild to moderate low flow, low gradient AS with mean gradient , Vmax 2.2, DI 0.3, AVA 1.0cm2 SVi 13.5 (was previously mild to moderate on TTE 04/2022). The aortic valve is calcified. There is severe calcifcation of the aortic valve. There is severe thickening of the aortic valve. Aortic valve regurgitation is mild.  7. The inferior vena cava is dilated in size with <50% respiratory variability, suggesting right atrial pressure of 15 mmHg. Comparison(s): Compared to prior TTE in 04/2022, the LVEF has dropped from 60-65% to 35% with global hypokinesis. Filling pressures are elevated on current study. FINDINGS  Left Ventricle: Left ventricular ejection fraction, by estimation, is 35%. The left ventricle has moderately decreased function. The left ventricle demonstrates global hypokinesis. Definity contrast agent was given IV to delineate the left ventricular endocardial borders. The left ventricular internal cavity size was normal in size. There is mild concentric left ventricular hypertrophy. Left ventricular diastolic parameters are consistent with Grade II diastolic dysfunction (pseudonormalization). Elevated left atrial pressure. Right Ventricle: The right ventricular size is normal. No increase in right ventricular wall thickness. Right ventricular systolic function is normal. There is mildly elevated pulmonary artery systolic pressure. The tricuspid regurgitant velocity is 2.70  m/s, and with an assumed right atrial pressure of 15 mmHg, the estimated right ventricular systolic pressure is 44.2 mmHg. Left Atrium: Left atrial size was severely dilated. Right Atrium: Right atrial size was mildly dilated. Pericardium: There is no evidence of pericardial effusion. Mitral Valve: The mitral valve is degenerative in appearance. There is mild thickening of the mitral valve leaflet(s). Trivial mitral valve regurgitation. MV peak gradient, 5.1 mmHg. The mean mitral valve gradient is 1.0  mmHg. Tricuspid Valve: The tricuspid valve is normal in structure. Tricuspid valve regurgitation is trivial. Aortic Valve: The aortic valve is not well visualized but appears severely calcified and thickened. There is at least mild to moderate low flow, low gradient AS with mean gradient , Vmax 2.2, DI 0.3, AVA 1.0cm2 SVi 13.5 (was previously mild to moderate on TTE 04/2022). The aortic valve is calcified. There is severe calcifcation of the aortic valve. There is severe thickening of the aortic valve. Aortic valve regurgitation is mild. Aortic regurgitation PHT measures 368 msec. Aortic valve mean gradient measures 10.8 mmHg. Aortic valve peak gradient measures 17.8 mmHg. Aortic valve area, by VTI measures 0.56 cm. Pulmonic Valve: The pulmonic valve was not well visualized. Pulmonic valve regurgitation is trivial. Aorta: The aortic root is normal in size and structure. Venous: The inferior vena cava is dilated in size with less than 50% respiratory variability, suggesting right atrial pressure of 15 mmHg. IAS/Shunts: The atrial septum is grossly normal.  LEFT VENTRICLE PLAX 2D LVIDd:         3.50 cm      Diastology LVIDs:         2.90 cm  LV e' medial:    5.65 cm/s LV PW:         1.50 cm      LV E/e' medial:  20.5 LV IVS:        1.10 cm      LV e' lateral:   5.49 cm/s LVOT diam:     1.50 cm      LV E/e' lateral: 21.1 LV SV:         25 LV SV Index:   12 LVOT Area:     1.77 cm  LV Volumes (MOD) LV vol d, MOD A2C: 149.0 ml LV vol d, MOD A4C: 126.0 ml LV vol s, MOD A2C: 92.6 ml LV vol s, MOD A4C: 80.9 ml LV SV MOD A2C:     56.4 ml LV SV MOD A4C:     126.0 ml LV SV MOD BP:      49.7 ml RIGHT VENTRICLE             IVC RV S prime:     13.60 cm/s  IVC diam: 2.50 cm TAPSE (M-mode): 1.9 cm LEFT ATRIUM             Index        RIGHT ATRIUM           Index LA diam:        3.40 cm 1.65 cm/m   RA Area:     26.10 cm LA Vol (A2C):   89.2 ml 43.38 ml/m  RA Volume:   87.80 ml  42.70 ml/m LA Vol (A4C):   91.0 ml 44.26  ml/m LA Biplane Vol: 99.1 ml 48.20 ml/m  AORTIC VALVE AV Area (Vmax):    0.57 cm AV Area (Vmean):   0.55 cm AV Area (VTI):     0.56 cm AV Vmax:           211.00 cm/s AV Vmean:          155.750 cm/s AV VTI:            0.442 m AV Peak Grad:      17.8 mmHg AV Mean Grad:      10.8 mmHg LVOT Vmax:         67.55 cm/s LVOT Vmean:        48.450 cm/s LVOT VTI:          0.139 m LVOT/AV VTI ratio: 0.31 AI PHT:            368 msec  AORTA Ao Root diam: 3.50 cm Ao Asc diam:  3.00 cm MITRAL VALVE                TRICUSPID VALVE MV Area (PHT): 4.89 cm     TR Peak grad:   29.2 mmHg MV Area VTI:   0.89 cm     TR Vmax:        270.00 cm/s MV Peak grad:  5.1 mmHg MV Mean grad:  1.0 mmHg     SHUNTS MV Vmax:       1.13 m/s     Systemic VTI:  0.14 m MV Vmean:      54.2 cm/s    Systemic Diam: 1.50 cm MV Decel Time: 155 msec MV E velocity: 116.00 cm/s MV A velocity: 52.80 cm/s MV E/A ratio:  2.20 Laurance Flatten MD Electronically signed by Laurance Flatten MD Signature Date/Time: 08/17/2022/12:15:50 PM    Final    DG CHEST PORT 1 VIEW  Result Date: 08/17/2022  CLINICAL DATA:  Hypoxia EXAM: PORTABLE CHEST 1 VIEW COMPARISON:  Chest CT and radiograph from yesterday FINDINGS: Edema, pleural effusions, and atelectasis by recent CT. More focal opacity in the right mid chest above the minor fissure since prior. No pneumothorax. Cardiomegaly and vascular pedicle widening is stable. IMPRESSION: 1. Improved pulmonary edema. 2. New and focal opacity in the right mid lung, cannot exclude superimposed pneumonia. Electronically Signed   By: Tiburcio Pea M.D.   On: 08/17/2022 05:06   CT Angio Chest PE W and/or Wo Contrast  Result Date: 08/08/2022 CLINICAL DATA:  Positive D-dimer. Intermediate probability for pulmonary embolus. EXAM: CT ANGIOGRAPHY CHEST WITH CONTRAST TECHNIQUE: Multidetector CT imaging of the chest was performed using the standard protocol during bolus administration of intravenous contrast. Multiplanar CT image  reconstructions and MIPs were obtained to evaluate the vascular anatomy. RADIATION DOSE REDUCTION: This exam was performed according to the departmental dose-optimization program which includes automated exposure control, adjustment of the mA and/or kV according to patient size and/or use of iterative reconstruction technique. CONTRAST:  75mL OMNIPAQUE IOHEXOL 350 MG/ML SOLN COMPARISON:  09/15/2014 FINDINGS: Cardiovascular: The heart size is upper normal to borderline enlarged. No substantial pericardial effusion. Coronary artery calcification is evident. Moderate atherosclerotic calcification is noted in the wall of the thoracic aorta. There is no filling defect within the opacified pulmonary arteries to suggest the presence of an acute pulmonary embolus. Mediastinum/Nodes: No mediastinal lymphadenopathy. There is no hilar lymphadenopathy. The esophagus has normal imaging features. There is no axillary lymphadenopathy. Lungs/Pleura: Bilateral lower lobe collapse/consolidation is associated with small left and moderate right pleural effusions. Patchy ground-glass opacity in the upper lobes and right middle lobe may be atelectasis although component of underlying airspace infection is not excluded. Upper Abdomen: Unremarkable. Musculoskeletal: No worrisome lytic or sclerotic osseous abnormality. Review of the MIP images confirms the above findings. IMPRESSION: 1. No CT evidence for acute pulmonary embolus. 2. Bilateral lower lobe collapse/consolidation with small left and moderate right pleural effusions. 3. Patchy ground-glass opacity in the upper lobes and right middle lobe may be atelectasis although component of underlying airspace infection is not excluded. 4.  Aortic Atherosclerosis (ICD10-I70.0). Electronically Signed   By: Kennith Center M.D.   On: 08/31/2022 15:20   DG Chest Portable 1 View  Result Date: 09/04/2022 CLINICAL DATA:  Chest pain EXAM: PORTABLE CHEST 1 VIEW COMPARISON:  None Available.  FINDINGS: Enlarged cardiopericardial silhouette. There is some linear changes along lung bases. Atelectasis versus subtle infiltrate. Recommend follow-up. No pneumothorax or significant effusion. Calcified aorta. Films under penetrated. Degenerative changes of the spine. IMPRESSION: Enlarged heart. Linear opacity seen along bases. Atelectasis versus subtle infiltrate. Recommend follow-up Electronically Signed   By: Karen Kays M.D.   On: 08/20/2022 13:28    Cardiac Studies   Echo from 08/17/22:   1. Left ventricular ejection fraction, by estimation, is 35%. The left  ventricle has moderately decreased function. The left ventricle  demonstrates global hypokinesis. There is mild concentric left ventricular  hypertrophy. Left ventricular diastolic  parameters are consistent with Grade II diastolic dysfunction  (pseudonormalization). Elevated left atrial pressure.   2. Right ventricular systolic function is normal. The right ventricular  size is normal. There is mildly elevated pulmonary artery systolic  pressure. The estimated right ventricular systolic pressure is 44.2 mmHg.   3. Left atrial size was severely dilated.   4. Right atrial size was mildly dilated.   5. The mitral valve is degenerative. Trivial mitral valve regurgitation.   6.  The aortic valve is not well visualized but appears severely calcified  and thickened. There is at least mild to moderate low flow, low gradient  AS with mean gradient , Vmax 2.2, DI 0.3, AVA 1.0cm2 SVi 13.5 (was  previously mild to moderate on  TTE 04/2022). The aortic valve is calcified. There is severe calcifcation  of the aortic valve. There is severe thickening of the aortic valve.  Aortic valve regurgitation is mild.   7. The inferior vena cava is dilated in size with <50% respiratory  variability, suggesting right atrial pressure of 15 mmHg.   Comparison(s): Compared to prior TTE in 04/2022, the LVEF has dropped from  60-65% to 35% with  global hypokinesis. Filling pressures are elevated on  current study.    Patient Profile     82 y.o. male with PMH of PAD, right BKA, left transmetatarsal amputation, aortic stenosis, dyslipidemia, anxiety, depression, peripheral neuropathy,  who presented with DOE, orthopnea, leg edema, and he is currently admitted for NSTEMI, acute systolic and diastolic heart failure.   Assessment & Plan   Acute hypoxic respiratory failure -Has not required BiPAP support over the past 48 hours, currently requiring increased FiO2 to 100% with pox 85-89%, on Unasyn for aspiration pneumonia, Lasix held 08/17/2022 due to elevated renal index, suspect multifactorial -Will give IV Lasix 80 mg x 1 now -Continue BiPAP support and antibiotic, critical care team paged for official consultation, primary team/hospitalist notified for ICU transfer request, patient is acceptable for mechanical ventilation as last resort of hypoxia, which is his son Molli Hazard to be his power of attorney  NSTEMI  - Presented with heart failure symptoms - Hs trop [id number] >>>[id number] - EKG with ST depression in the anterolateral leads new compared to 08/05/22 - Echo with LVEF down to 35% (from 60-65% in Feb 2024) - no chest pain currently - planned for R/L heart cath today, will cancel due to instability - currently on heparin gtt, ASA 81mg , plavix 75mg , lipitor 80mg , fenofibrate 160mg , will hold beta blocker today due to hypotension, on Plavix PTA/may need to hold to avoid risk of bleeding  -Will repeat EKG  Acute systolic and diastolic heart failure  -Presented with DOE, orthopnea, leg edema -BNP 879, POA -CTA chest negative for PE, bilateral lower lobe collapse/consolidation with small left and moderate right pleural effusions, patchy groundglass opacity in the upper lobe and right middle lobe may be atelectasis although component of underlying airspace infection is not excluded, POA - s/p IV diuresis, Net - since admission, weight  197.75 at admission, lasix held 6/10, will give IV Lasix 80mg  x1 now given acute hypoxia, will check lactic acid, will insert PICC line and check COOX, will transfer to ICU 2H, please tract daily weight and intak and output  - GDMT: BP low normal, hold beta blocker until rule out low output and BP stable, no room to add additional agents   Mild to moderate aortic stenosis -Echo from 6/10 revealed severely calcified aortic valve with mild to moderate low flow, low gradient AS with mean gradient , Vmax 2.2, DI 0.3, AVA 1.0cm2 SVi 13.5 -Avoid hypotension  AKI -Creatinine 0.81 on 07/14/2022, 1.39 POA, now 1.62 -Consider workup with urine studies and renal ultrasound, defer to primary team, he does not appear profoundly overloaded, cardiorenal syndrome is a diagnosis of rule out  Aspiration pneumonia + blood culture with staph epidermis  Peripheral neuropathy Anxiety with depression Abnormal thyroid hormone PAD with history of BKA -Managed per primary team  For questions or updates, please contact [facility] Please consult [email or web address] for contact info under        Signed, Cyndi Bender, NP  08/18/2022, 8:06 AM    Patient seen and examined   I agre with findings as noted above by X Zhao Overnight, pt's resp status has declined.   Now on 100% FiO2 with O2 sats below 90%   On exam Neck:  JVP is increased Lungs coarse BS bilaterally Cardiac exam   RRR  Tachycardic  II/VI systolic murmur    Ext  s/p R BKA;  no LLE edema  Recomm Tx to ICU CCM notifed to help with care Lasix ordered x 1 When central line placed, check COOX Plans for L heart cath on hold for now   Dietrich Pates MD

## 2022-08-18 NOTE — Procedures (Signed)
Bronchoscopy Procedure Note  Gene [name]  Gene [name]  [birth year]  Date:08/18/22  Time:2:56 PM   Provider Performing:Kenzington Mielke   Procedure(s):  Flexible bronchoscopy with bronchial alveolar lavage (40981)  Indication(s) Acute respiratory failure  Consent Risks of the procedure as well as the alternatives and risks of each were explained to the patient and/or caregiver.  Consent for the procedure was obtained and is signed in the bedside chart  Anesthesia Etomidate and Rocuronium   Time Out Verified patient identification, verified procedure, site/side was marked, verified correct patient position, special equipment/implants available, medications/allergies/relevant history reviewed, required imaging and test results available.   Sterile Technique Usual hand hygiene, masks, gowns, and gloves were used   Procedure Description Bronchoscope advanced through endotracheal tube and into airway.  Airways were examined down to subsegmental level with findings noted below.   Following diagnostic evaluation, BAL(s) performed in RLL with normal saline and return of Bloody  fluid  Findings: Friable and erythematous mucosa noted in RML/RLL, small oozing noted.    Complications/Tolerance None; patient tolerated the procedure well. Chest X-ray is not needed post procedure.   EBL Minimal   Specimen(s) BAL

## 2022-08-18 NOTE — Consult Note (Addendum)
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  Pulmonary critical care called to evaluate he will be transferred to intensive care unit with a high probability of intubation in the near future.  Pertinent  Medical History   Past Medical History:  Diagnosis Date   Anemia    "just in 2016 when I was bleeding internally" (08/28/2015)   Anxiety    Aortic stenosis    Arthritis    Cervical spondylolysis    Depression    Diabetic ulcer of left foot (HCC) 08/28/2015   pt states he does not have diabetes   Family history of anesthesia complication    "father would start seeing things"   Heart murmur    "related to the aortic stenosis" (08/28/2015)   History of alcohol abuse    History of blood transfusion    "just in 2016 when I was bleeding internally" (08/28/2015)   Hyperlipidemia    high TG   Hypertension    Lesion of ulnar nerve 07/22/2012   Bilateral ulnar neuropathies   Neuropathy    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   left   PVD (peripheral vascular disease) (HCC)    PVD, mild saw Dr Allyson Sabal 2012, intolerant to pletal, Rx observation   Ulcer of esophagus 2016   "caused by positioning related to foot wound"     Significant Hospital Events: Including procedures, antibiotic start and stop dates in addition to other pertinent events   08/18/2022 intubated  Interim History / Subjective:  82 year old male currently on noninvasive mechanical ventilatory support but with all the  hallmarks of impending respiratory failure  Objective   Blood pressure 95/75, pulse (!) 106, temperature 98.8 F (37.1 C), temperature source Axillary, resp. rate 18, height 5\' 9"  (1.753 m), weight 89.7 kg, SpO2 92 %.    FiO2 (%):  [60 %-100 %] 100 %   Intake/Output Summary (Last 24 hours) at 08/18/2022 0826 Last data filed at 08/18/2022 0800 Gross per 24 hour  Intake 790.84 ml  Output 1150 ml  Net -359.16 ml   Filed Weights   08-22-22 1240 2022-08-22 1949  Weight: 89.8 kg 89.7 kg    Examination: General: Elderly male who is awake and alert prior to intubation HENT: JVD is appreciated oropharynx is unremarkable Lungs: Decreased air movement throughout despite being on noninvasive mechanical ventilatory support Cardiovascular: Heart sounds are regular Abdomen: Abdomen is soft positive bowel sounds obese Extremities: Right BKA left great metatarsal amputation Neuro: Awake and alert follows commands GU: Amber urine  Resolved Hospital Problem list     Assessment & Plan:  Acute hypoxic respiratory failure in the setting of congestive heart failure with a EF of 35% multiple medical issues and have not proven refractory to noninvasive mechanical ventilatory support. Transfer to intensive care unit Urgent intubation Ventilator bundle Wean per protocol   RLL effusions ?? Pna ABX ? Need for thora  History of vascular insufficiency with left BKA and left metatarsal amputation Maintain adequate blood flow to all extremities  Diabetes mellitus CBG (last 3)  No results for input(s): "GLUCAP" in the last 72 hours. Sliding-scale insulin protocol  Best Practice (right click and "Reselect all SmartList Selections" daily)   Diet/type: NPO DVT prophylaxis: systemic heparin GI prophylaxis: PPI Lines: N/A Foley:  N/A Code Status:  full code Last date of multidisciplinary goals of care discussion [tbd]  Requested intubation Labs   CBC: Recent Labs  Lab September 14, 2022 1242  08/17/22 0312 08/18/22 0246  WBC 6.7 7.0 7.2  HGB 13.1 13.0 12.5*  HCT 38.2* 37.2* 35.8*  MCV 91.2 87.5 89.5  PLT 169 190 170    Basic Metabolic Panel: Recent Labs  Lab 2022-09-14 1242 08/17/22 0312 08/17/22 1437 08/18/22 0246  NA 128* 128* 129* 132*  K 3.9 4.2 4.3 3.9  CL 95* 95* 93* 96*  CO2 17* 15* 17* 19*  GLUCOSE 97 80 95 90  BUN 15 19 27* 34*  CREATININE 1.39* 1.50* 1.62* 1.62*  CALCIUM 8.9 8.7* 9.1 8.9  MG 1.7  --   --   --    GFR: Estimated Creatinine Clearance: 40.3 mL/min (A) (by C-G formula based on SCr of 1.62 mg/dL (H)). Recent Labs  Lab Sep 14, 2022 1242 08/17/22 0312 08/17/22 0435 08/18/22 0246  PROCALCITON  --   --  <0.10  --   WBC 6.7 7.0  --  7.2    Liver Function Tests: Recent Labs  Lab 08/17/22 0312  AST 243*  ALT 46*  ALKPHOS 30*  BILITOT 1.6*  PROT 5.7*  ALBUMIN 3.2*   No results for input(s): "LIPASE", "AMYLASE" in the last 168 hours. No results for input(s): "AMMONIA" in the last 168 hours.  ABG No results found for: "PHART", "PCO2ART", "PO2ART", "HCO3", "TCO2", "ACIDBASEDEF", "O2SAT"   Coagulation Profile: No results for input(s): "INR", "PROTIME" in the last 168 hours.  Cardiac Enzymes: No results for input(s): "CKTOTAL", "CKMB", "CKMBINDEX", "TROPONINI" in the last 168 hours.  HbA1C: Hgb A1c MFr Bld  Date/Time Value Ref Range Status  07/14/2022 04:40 PM 5.5 4.6 - 6.5 % Final    Comment:    Glycemic Control Guidelines for People with Diabetes:Non Diabetic:  <6%Goal of Therapy: <7%Additional Action Suggested:  >8%   10/14/2021 02:47 PM 5.6 4.6 - 6.5 % Final    Comment:    Glycemic Control Guidelines for People with Diabetes:Non Diabetic:  <6%Goal of Therapy: <7%Additional Action Suggested:  >8%     CBG: No results for input(s): "GLUCAP" in the last 168 hours.  Review of Systems:   10 point review of system taken, please see HPI for positives and negatives.   Past Medical History:  He,  has a past medical history of  Anemia, Anxiety, Aortic stenosis, Arthritis, Cervical spondylolysis, Depression, Diabetic ulcer of left foot (HCC) (08/28/2015), Family history of anesthesia complication, Heart murmur, History of alcohol abuse, History of blood transfusion, Hyperlipidemia, Hypertension, Lesion of ulnar nerve (07/22/2012), Neuropathy, Osteomyelitis of ankle and foot (HCC) (10/2013), PVD (peripheral vascular disease) (HCC), and Ulcer of esophagus (2016).   Surgical History:   Past Surgical History:  Procedure Laterality Date   AMPUTATION Left 11/20/13 and 11/24/13   middle   AMPUTATION Left 01/03/2014   Procedure: Left Transmetatarsal Amputation;  Surgeon: Nadara Mustard, MD;  Location: [facility] OR;  Service: Orthopedics;  Laterality: Left;   AMPUTATION Right 07/27/2014   Procedure: Right 4th Ray Amputation;  Surgeon: Nadara Mustard, MD;  Location: [facility] OR;  Service: Orthopedics;  Laterality: Right;   AMPUTATION Right 09/12/2014   Procedure: RIGHT TRANSMETATARSAL AMPUTATION;  Surgeon: Berna Spare  Kandis Mannan, MD;  Location: [facility] OR;  Service: Orthopedics;  Laterality: Right;   AMPUTATION Right 09/28/2014   Procedure: Right Below Knee Amputation;  Surgeon: Nadara Mustard, MD;  Location: [facility] OR;  Service: Orthopedics;  Laterality: Right;   CATARACT EXTRACTION W/ INTRAOCULAR LENS  IMPLANT, BILATERAL Bilateral 05/2015   ESOPHAGOGASTRODUODENOSCOPY N/A 08/16/2014   Procedure: ESOPHAGOGASTRODUODENOSCOPY (EGD);  Surgeon: Vida Rigger, MD;  Location: [facility] ENDOSCOPY;  Service: Endoscopy;  Laterality: N/A;   ESOPHAGOGASTRODUODENOSCOPY (EGD) WITH PROPOFOL N/A 08/17/2014   Procedure: ESOPHAGOGASTRODUODENOSCOPY (EGD) WITH PROPOFOL;  Surgeon: Graylin Shiver, MD;  Location: [facility] ENDOSCOPY;  Service: Endoscopy;  Laterality: N/A;   LAPAROSCOPIC CHOLECYSTECTOMY     TONSILLECTOMY     TRANSMETATARSAL AMPUTATION Left 10/2013     Social History:   reports that he has never smoked. He has never used smokeless tobacco. He reports current alcohol use. He reports that he does  not use drugs.   Family History:  His family history includes Colon polyps in his father; Hyperlipidemia in his mother; Lung cancer in his father. There is no history of Diabetes, Coronary artery disease, Prostate cancer, or Colon cancer.   Allergies No Known Allergies   Home Medications  Prior to Admission medications   Medication Sig Start Date End Date Taking? Authorizing Provider  ALPRAZolam (XANAX) 0.5 MG tablet TAKE 1 TABLET(0.5 MG) BY MOUTH THREE TIMES DAILY AS NEEDED FOR ANXIETY Patient taking differently: Take 0.5 mg by mouth 3 (three) times daily as needed for anxiety. 07/07/22  Yes Paz, Nolon Rod, MD  amLODipine (NORVASC) 5 MG tablet Take 1 tablet (5 mg total) daily by mouth. 01/15/17  Yes Paz, Nolon Rod, MD  Ascorbic Acid (VITAMIN C) 1000 MG tablet Take 3,000 mg by mouth daily.   Yes [provider]  atorvastatin (LIPITOR) 80 MG tablet Take 1 tablet (80 mg total) by mouth at bedtime. 07/17/22  Yes Paz, Nolon Rod, MD  citalopram (CELEXA) 40 MG tablet Take 1 tablet (40 mg total) by mouth daily. 07/27/22  Yes Paz, Nolon Rod, MD  clopidogrel (PLAVIX) 75 MG tablet Take 75 mg by mouth daily.   Yes [provider]  cycloSPORINE (RESTASIS) 0.05 % ophthalmic emulsion Place 2 drops into both eyes daily as needed (eyes).   Yes [provider]  fenofibrate 160 MG tablet Take 1 tablet (160 mg total) by mouth daily. 03/04/22  Yes Paz, Nolon Rod, MD  losartan (COZAAR) 100 MG tablet Take 1 tablet (100 mg total) by mouth daily. 02/15/19  Yes Paz, Nolon Rod, MD  Multiple Vitamin (MULTIVITAMIN WITH MINERALS) TABS tablet Take 1 tablet by mouth daily.   Yes [provider]  pregabalin (LYRICA) 150 MG capsule Take 150 mg by mouth in the morning, at noon, and at bedtime.   Yes [provider]  Propylene Glycol (SYSTANE BALANCE OP) Apply 1 drop to eye 2 (two) times daily as needed (dry eyes). Reported on 08/27/2015   Yes [provider]  Psyllium (METAMUCIL PO) Take 5  capsules by mouth daily.    Yes [provider]  pyridOXINE (VITAMIN B-6) 100 MG tablet Take 100 mg by mouth daily.   Yes [provider]  RA ASPIRIN EC 81 MG EC tablet Take 81 mg by mouth daily. 12/06/14  Yes [provider]  thiamine 50 MG tablet Take 50 mg by mouth daily.   Yes [provider]  traZODone (DESYREL) 100 MG tablet Take 100 mg by mouth at bedtime.   Yes [provider]  vitamin B-12 (CYANOCOBALAMIN) 1000 MCG tablet Take 2,000 mcg by mouth daily.    Yes [provider]  mupirocin ointment (BACTROBAN) 2 % Apply 1 application topically 2 (two) times daily. Patient not taking: Reported on 08/28/22 09/06/20   [name], MD     Critical care time: 45 min    Brett Canales Lizbet Cirrincione ACNP Acute Care Nurse Practitioner [facility]/Critical Care Please consult Amion 08/18/2022, 8:26 AM

## 2022-08-18 NOTE — Progress Notes (Signed)
ANTICOAGULATION CONSULT NOTE   Pharmacy Consult for heparin  Indication: chest pain/ACS  No Known Allergies  Patient Measurements: Height: 5\' 9"  (175.3 cm) Weight: 89.7 kg (197 lb 12 oz) IBW/kg (Calculated) : 70.7 Heparin Dosing Weight: 88.8  Vital Signs: Temp: 98.8 F (37.1 C) (06/11 0454) Temp Source: Axillary (06/11 0454) BP: 95/75 (06/11 0454) Pulse Rate: 106 (06/11 0454)  Labs: Recent Labs    08/16/22 1242 08/16/22 1444 08/16/22 2304 08/17/22 0312 08/17/22 0435 08/17/22 1437 08/18/22 0246  HGB 13.1  --   --  13.0  --   --  12.5*  HCT 38.2*  --   --  37.2*  --   --  35.8*  PLT 169  --   --  190  --   --  170  HEPARINUNFRC  --   --  0.63 0.58  --   --  0.54  CREATININE 1.39*  --   --  1.50*  --  1.62* 1.62*  TROPONINIHS 6,646* 10,311*  --   --  >24,000*  --   --      Estimated Creatinine Clearance: 40.3 mL/min (A) (by C-G formula based on SCr of 1.62 mg/dL (H)).   Medical History: Past Medical History:  Diagnosis Date   Anemia    "just in 2016 when I was bleeding internally" (08/28/2015)   Anxiety    Aortic stenosis    Arthritis    Cervical spondylolysis    Depression    Diabetic ulcer of left foot (HCC) 08/28/2015   pt states he does not have diabetes   Family history of anesthesia complication    "father would start seeing things"   Heart murmur    "related to the aortic stenosis" (08/28/2015)   History of alcohol abuse    History of blood transfusion    "just in 2016 when I was bleeding internally" (08/28/2015)   Hyperlipidemia    high TG   Hypertension    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 EKG showing  Afib, not on anticoag PTA. Trop elevation up to >24,000. Pharmacy consulted to dose heparin.   Heparin level this morning came back therapeutic at 0.54, on 1100 units/hr. Hgb 12.5, plt 170. No s/sx of bleeding or infusion issues.   Goal of Therapy:  Heparin level 0.3-0.7 units/ml Monitor platelets by anticoagulation protocol: Yes   Plan:  Continue IV Heparin 1100 units/hr. Daily heparin level and CBC. F/u plans for heparin after cath  Thank you for allowing pharmacy to participate in this patient's care,  Sherron Monday, PharmD, BCCCP Clinical Pharmacist  Phone: [phone] 08/18/2022 7:46 AM  Please check AMION for all [facility] Pharmacy phone numbers After 10:00 PM, call Main Pharmacy [phone]

## 2022-08-19 ENCOUNTER — Encounter (HOSPITAL_COMMUNITY): Admission: EM | Disposition: E | Payer: Medicare Other | Source: Home / Self Care | Attending: Pulmonary Disease

## 2022-08-19 ENCOUNTER — Inpatient Hospital Stay (HOSPITAL_COMMUNITY): Payer: Medicare Other

## 2022-08-19 DIAGNOSIS — I5041 Acute combined systolic (congestive) and diastolic (congestive) heart failure: Secondary | ICD-10-CM | POA: Diagnosis not present

## 2022-08-19 DIAGNOSIS — A419 Sepsis, unspecified organism: Secondary | ICD-10-CM

## 2022-08-19 DIAGNOSIS — R6521 Severe sepsis with septic shock: Secondary | ICD-10-CM | POA: Diagnosis not present

## 2022-08-19 DIAGNOSIS — I214 Non-ST elevation (NSTEMI) myocardial infarction: Secondary | ICD-10-CM | POA: Diagnosis not present

## 2022-08-19 LAB — GLUCOSE, CAPILLARY: Glucose-Capillary: 104 mg/dL — ABNORMAL HIGH (ref 70–99)

## 2022-08-19 LAB — CBC
HCT: 35.5 % — ABNORMAL LOW (ref 39.0–52.0)
Hemoglobin: 12.4 g/dL — ABNORMAL LOW (ref 13.0–17.0)
MCH: 29.5 pg (ref 26.0–34.0)
MCHC: 34.9 g/dL (ref 30.0–36.0)
MCV: 84.3 fL (ref 80.0–100.0)
Platelets: 224 10*3/uL (ref 150–400)
RBC: 4.21 MIL/uL — ABNORMAL LOW (ref 4.22–5.81)
RDW: 14.5 % (ref 11.5–15.5)
WBC: 11.2 10*3/uL — ABNORMAL HIGH (ref 4.0–10.5)
nRBC: 0 % (ref 0.0–0.2)

## 2022-08-19 LAB — POCT I-STAT 7, (LYTES, BLD GAS, ICA,H+H)
Acid-Base Excess: 6 mmol/L — ABNORMAL HIGH (ref 0.0–2.0)
Acid-Base Excess: 6 mmol/L — ABNORMAL HIGH (ref 0.0–2.0)
Acid-Base Excess: 5 mmol/L — ABNORMAL HIGH (ref 0.0–2.0)
Acid-Base Excess: 4 mmol/L — ABNORMAL HIGH (ref 0.0–2.0)
Bicarbonate: 25.5 mmol/L (ref 20.0–28.0)
Bicarbonate: 26.3 mmol/L (ref 20.0–28.0)
Bicarbonate: 26.3 mmol/L (ref 20.0–28.0)
Bicarbonate: 27.9 mmol/L (ref 20.0–28.0)
Calcium, Ion: 1.06 mmol/L — ABNORMAL LOW (ref 1.15–1.40)
Calcium, Ion: 1.08 mmol/L — ABNORMAL LOW (ref 1.15–1.40)
Calcium, Ion: 1.08 mmol/L — ABNORMAL LOW (ref 1.15–1.40)
Calcium, Ion: 1.07 mmol/L — ABNORMAL LOW (ref 1.15–1.40)
HCT: 35 % — ABNORMAL LOW (ref 39.0–52.0)
HCT: 35 % — ABNORMAL LOW (ref 39.0–52.0)
HCT: 33 % — ABNORMAL LOW (ref 39.0–52.0)
HCT: 33 % — ABNORMAL LOW (ref 39.0–52.0)
Hemoglobin: 11.9 g/dL — ABNORMAL LOW (ref 13.0–17.0)
Hemoglobin: 11.9 g/dL — ABNORMAL LOW (ref 13.0–17.0)
Hemoglobin: 11.2 g/dL — ABNORMAL LOW (ref 13.0–17.0)
Hemoglobin: 11.2 g/dL — ABNORMAL LOW (ref 13.0–17.0)
O2 Saturation: 98 %
O2 Saturation: 99 %
O2 Saturation: 100 %
O2 Saturation: 99 %
Patient temperature: 100.4
Patient temperature: 101.9
Patient temperature: 98.6
Patient temperature: 98.3
Potassium: 3.1 mmol/L — ABNORMAL LOW (ref 3.5–5.1)
Potassium: 3.2 mmol/L — ABNORMAL LOW (ref 3.5–5.1)
Potassium: 3.7 mmol/L (ref 3.5–5.1)
Potassium: 3.9 mmol/L (ref 3.5–5.1)
Sodium: 135 mmol/L (ref 135–145)
Sodium: 136 mmol/L (ref 135–145)
Sodium: 135 mmol/L (ref 135–145)
Sodium: 136 mmol/L (ref 135–145)
TCO2: 26 mmol/L (ref 22–32)
TCO2: 27 mmol/L (ref 22–32)
TCO2: 27 mmol/L (ref 22–32)
TCO2: 29 mmol/L (ref 22–32)
pCO2 arterial: 23.3 mmHg — ABNORMAL LOW (ref 32–48)
pCO2 arterial: 28.1 mmHg — ABNORMAL LOW (ref 32–48)
pCO2 arterial: 26.6 mmHg — ABNORMAL LOW (ref 32–48)
pCO2 arterial: 36.4 mmHg (ref 32–48)
pH, Arterial: 7.65 (ref 7.35–7.45)
pH, Arterial: 7.583 — ABNORMAL HIGH (ref 7.35–7.45)
pH, Arterial: 7.603 (ref 7.35–7.45)
pH, Arterial: 7.491 — ABNORMAL HIGH (ref 7.35–7.45)
pO2, Arterial: 89 mmHg (ref 83–108)
pO2, Arterial: 123 mmHg — ABNORMAL HIGH (ref 83–108)
pO2, Arterial: 232 mmHg — ABNORMAL HIGH (ref 83–108)
pO2, Arterial: 115 mmHg — ABNORMAL HIGH (ref 83–108)

## 2022-08-19 LAB — BASIC METABOLIC PANEL
Anion gap: 17 — ABNORMAL HIGH (ref 5–15)
BUN: 49 mg/dL — ABNORMAL HIGH (ref 8–23)
CO2: 25 mmol/L (ref 22–32)
Calcium: 8.5 mg/dL — ABNORMAL LOW (ref 8.9–10.3)
Chloride: 95 mmol/L — ABNORMAL LOW (ref 98–111)
Creatinine, Ser: 2.47 mg/dL — ABNORMAL HIGH (ref 0.61–1.24)
GFR, Estimated: 26 mL/min — ABNORMAL LOW (ref >60)
Glucose, Bld: 150 mg/dL — ABNORMAL HIGH (ref 70–99)
Potassium: 3.1 mmol/L — ABNORMAL LOW (ref 3.5–5.1)
Sodium: 137 mmol/L (ref 135–145)

## 2022-08-19 LAB — COOXEMETRY PANEL
Carboxyhemoglobin: 2.1 % — ABNORMAL HIGH (ref 0.5–1.5)
Carboxyhemoglobin: 1 % (ref 0.5–1.5)
Methemoglobin: <0.7 % (ref 0.0–1.5)
Methemoglobin: <0.7 % (ref 0.0–1.5)
O2 Saturation: 96.6 %
O2 Saturation: 60.1 %
Total hemoglobin: 12.5 g/dL (ref 12.0–16.0)
Total hemoglobin: 12.7 g/dL (ref 12.0–16.0)

## 2022-08-19 LAB — MAGNESIUM: Magnesium: 2.1 mg/dL (ref 1.7–2.4)

## 2022-08-19 LAB — HEMOGLOBIN AND HEMATOCRIT, BLOOD
HCT: 33.2 % — ABNORMAL LOW (ref 39.0–52.0)
Hemoglobin: 11.3 g/dL — ABNORMAL LOW (ref 13.0–17.0)

## 2022-08-19 LAB — PHOSPHORUS: Phosphorus: 4.8 mg/dL — ABNORMAL HIGH (ref 2.5–4.6)

## 2022-08-19 SURGERY — RIGHT/LEFT HEART CATH AND CORONARY ANGIOGRAPHY
Anesthesia: LOCAL

## 2022-08-19 MED ORDER — FOLIC ACID 1 MG PO TABS
1.0000 mg | ORAL_TABLET | Freq: Every day | ORAL | Status: DC
  Administered 2022-08-19 – 2022-08-20 (×2): 1 mg
  Filled 2022-08-19 (×2): qty 1

## 2022-08-19 MED ORDER — INSULIN ASPART 100 UNIT/ML IJ SOLN
0.0000 [IU] | INTRAMUSCULAR | Status: DC
  Administered 2022-08-20 (×2): 3 [IU] via SUBCUTANEOUS
  Administered 2022-08-20: 2 [IU] via SUBCUTANEOUS
  Administered 2022-08-20: 5 [IU] via SUBCUTANEOUS
  Administered 2022-08-20: 2 [IU] via SUBCUTANEOUS
  Administered 2022-08-20: 3 [IU] via SUBCUTANEOUS
  Administered 2022-08-21: 5 [IU] via SUBCUTANEOUS
  Administered 2022-08-21: 3 [IU] via SUBCUTANEOUS

## 2022-08-19 MED ORDER — VITAL 1.5 CAL PO LIQD
1000.0000 mL | ORAL | Status: DC
  Administered 2022-08-19: 1000 mL

## 2022-08-19 MED ORDER — POTASSIUM CHLORIDE 10 MEQ/50ML IV SOLN
10.0000 meq | INTRAVENOUS | Status: AC
  Administered 2022-08-19 (×3): 10 meq via INTRAVENOUS
  Filled 2022-08-19 (×3): qty 50

## 2022-08-19 MED ORDER — MIDAZOLAM HCL 2 MG/2ML IJ SOLN: 2.0000 mg | Freq: Once | INTRAMUSCULAR | Status: DC

## 2022-08-19 MED ORDER — FUROSEMIDE 10 MG/ML IJ SOLN
40.0000 mg | Freq: Once | INTRAMUSCULAR | Status: AC
  Administered 2022-08-19: 40 mg via INTRAVENOUS
  Filled 2022-08-19: qty 4

## 2022-08-19 MED ORDER — MIDAZOLAM HCL 2 MG/2ML IJ SOLN
INTRAMUSCULAR | Status: AC
  Administered 2022-08-19: 2 mg
  Filled 2022-08-19: qty 2

## 2022-08-19 MED ORDER — PROSOURCE TF20 ENFIT COMPATIBL EN LIQD
60.0000 mL | Freq: Two times a day (BID) | ENTERAL | Status: DC
  Administered 2022-08-19 – 2022-08-20 (×3): 60 mL
  Filled 2022-08-19 (×3): qty 60

## 2022-08-19 MED ORDER — THIAMINE MONONITRATE 100 MG PO TABS
100.0000 mg | ORAL_TABLET | Freq: Every day | ORAL | Status: DC
  Administered 2022-08-20: 100 mg
  Filled 2022-08-19 (×2): qty 1

## 2022-08-19 NOTE — Progress Notes (Signed)
eLink Physician-Brief Progress Note Patient Name: Gene [name] DOB: 11/19/1940 MRN: [id number]   Date of Service  08/19/2022  HPI/Events of Note  Patient with only 160 ml of urine output in the past 24 hours, CVP 10.  eICU Interventions  Lasix 40 mg iv x 1 ordered.        Miryam Mcelhinney U Aileen Amore 08/19/2022, 3:02 AM

## 2022-08-19 NOTE — Procedures (Signed)
Arterial Catheter Insertion Procedure Note  Gene [name]  [id number]  Nov 29, 1940  Date:08/19/22  Time:5:55 PM    Provider Performing: Cheri Fowler    Procedure: Insertion of Arterial Line (86578) with US guidance ([id number])   Indication(s) Blood pressure monitoring and/or need for frequent ABGs  Consent Risks of the procedure as well as the alternatives and risks of each were explained to the patient and/or caregiver.  Consent for the procedure was obtained and is signed in the bedside chart  Anesthesia None   Time Out Verified patient identification, verified procedure, site/side was marked, verified correct patient position, special equipment/implants available, medications/allergies/relevant history reviewed, required imaging and test results available.   Sterile Technique Maximal sterile technique including full sterile barrier drape, hand hygiene, sterile gown, sterile gloves, mask, hair covering, sterile ultrasound probe cover (if used).   Procedure Description Area of catheter insertion was cleaned with chlorhexidine and draped in sterile fashion. With real-time ultrasound guidance an arterial catheter was placed into the right  Axillary  artery.  Appropriate arterial tracings confirmed on monitor.     Complications/Tolerance None; patient tolerated the procedure well.   EBL Minimal   Specimen(s) None

## 2022-08-19 NOTE — Progress Notes (Signed)
Rounding Note    Patient Name: Gene [name] Date of Encounter: 08/31/2022  [facility] Cardiologist: Verne Carrow, MD   Subjective   Patient intubated, sedated    Inpatient Medications    Scheduled Meds:  aspirin  81 mg Per Tube Daily   atorvastatin  80 mg Per Tube QHS   Chlorhexidine Gluconate Cloth  6 each Topical Daily   citalopram  40 mg Per Tube Daily   cyanocobalamin  2,000 mcg Per Tube Daily   docusate  100 mg Per Tube BID   multivitamin with minerals  1 tablet Oral Daily   mouth rinse  15 mL Mouth Rinse Q2H   pantoprazole (PROTONIX) IV  40 mg Intravenous Q12H   polyethylene glycol  17 g Per Tube Daily   pregabalin  100 mg Per Tube TID   pyridOXINE  100 mg Per Tube Daily   thiamine  50 mg Per Tube Daily   traZODone  100 mg Per Tube QHS   Continuous Infusions:  sodium chloride 250 mL (08/09/2022 0601)   dexmedetomidine (PRECEDEX) IV infusion 1 mcg/kg/hr (08/29/2022 0600)   epoprostenol (VELETRI) for inhalation 1.5mg /67mL (30,000 ng/mL) 50 ng/kg/min (08/25/2022 0449)   norepinephrine (LEVOPHED) Adult infusion 12 mcg/min (08/31/2022 0600)   piperacillin-tazobactam (ZOSYN)  IV 12.5 mL/hr at 08/09/2022 0600   potassium chloride 10 mEq (08/31/2022 0607)   vasopressin 0.03 Units/min (08/22/2022 0600)   PRN Meds: acetaminophen, ALPRAZolam, fentaNYL (SUBLIMAZE) injection, fentaNYL (SUBLIMAZE) injection, mouth rinse   Vital Signs    Vitals:   08/30/2022 0600 08/20/2022 0615 08/12/2022 0630 08/15/2022 0645  BP: 114/71 112/74 107/82 102/78  Pulse: 91 (!) 105 92 96  Resp: 14 10 (!) 22 13  Temp:      TempSrc:      SpO2: 100% 100% 100% 100%  Weight:      Height:        Intake/Output Summary (Last 24 hours) at 08/22/2022 0658 Last data filed at 09/06/2022 0651 Gross per 24 hour  Intake 1023.8 ml  Output 933 ml  Net 90.8 ml      08/18/2022   11:02 PM 08/12/2022    7:49 PM 08/23/2022   12:40 PM  Last 3 Weights  Weight (lbs) 184 lb 8.4 oz 197 lb 12 oz 198 lb   Weight (kg) 83.7 kg 89.7 kg 89.812 kg      Telemetry    SR and atrial fib (rates 100s)  - Personally Reviewed  ECG    No new today  - Personally Reviewed  Physical Exam   GEN: Pt intubated Neck: Full Cardiac: Irreg irreg   No S3    Respiratory: Coarse rales, rhonchi RLL  GI: Soft MS: s/p R BKA, LLE no edema   Extrem cool    Labs    High Sensitivity Troponin:   Recent Labs  Lab 08/27/2022 1242 08/20/2022 1444 08/17/22 0435 08/18/22 1517  TROPONINIHS 6,646* 10,311* >24,000* 19,420*     Chemistry Recent Labs  Lab 08/16/22 1242 08/17/22 0312 08/17/22 1437 08/18/22 0246 08/18/22 0948 08/18/22 1455 08/15/2022 0403 09/06/2022 0444 08/27/2022 0532  NA 128* 128*   < > 132*   < > 137 137 135 136  K 3.9 4.2   < > 3.9   < > 3.6 3.1* 3.1* 3.2*  CL 95* 95*   < > 96*  --  91* 95*  --   --   CO2 17* 15*   < > 19*  --  22 25  --   --  GLUCOSE 97 80   < > 90  --  204* 150*  --   --   BUN 15 19   < > 34*  --  47* 49*  --   --   CREATININE 1.39* 1.50*   < > 1.62*  --  2.29* 2.47*  --   --   CALCIUM 8.9 8.7*   < > 8.9  --  9.0 8.5*  --   --   MG 1.7  --   --   --   --   --   --   --   --   PROT  --  5.7*  --   --   --   --   --   --   --   ALBUMIN  --  3.2*  --   --   --   --   --   --   --   AST  --  243*  --   --   --   --   --   --   --   ALT  --  46*  --   --   --   --   --   --   --   ALKPHOS  --  30*  --   --   --   --   --   --   --   BILITOT  --  1.6*  --   --   --   --   --   --   --   GFRNONAA 51* 47*   < > 43*  --  28* 26*  --   --   ANIONGAP 16* 18*   < > 17*  --  24* 17*  --   --    < > = values in this interval not displayed.    Lipids No results for input(s): "CHOL", "TRIG", "HDL", "LABVLDL", "LDLCALC", "CHOLHDL" in the last 168 hours.  Hematology Recent Labs  Lab 08/17/22 0312 08/18/22 0246 08/18/22 0948 09/03/2022 0403 08/26/2022 0444 08/12/2022 0532  WBC 7.0 7.2  --  11.2*  --   --   RBC 4.25 4.00*  --  4.21*  --   --   HGB 13.0 12.5*   < > 12.4* 11.9* 11.9*   HCT 37.2* 35.8*   < > 35.5* 35.0* 35.0*  MCV 87.5 89.5  --  84.3  --   --   MCH 30.6 31.3  --  29.5  --   --   MCHC 34.9 34.9  --  34.9  --   --   RDW 14.1 14.3  --  14.5  --   --   PLT 190 170  --  224  --   --    < > = values in this interval not displayed.   Thyroid  Recent Labs  Lab 08/18/2022 1242 08/17/22 0312  TSH 4.866*  --   FREET4  --  1.24*    BNP Recent Labs  Lab 08/20/2022 1251  BNP 879.2*    DDimer  Recent Labs  Lab 09/01/2022 1242  DDIMER 0.91*     Radiology    DG CHEST PORT 1 VIEW  Result Date: 08/18/2022 CLINICAL DATA:  Central line placement EXAM: PORTABLE CHEST 1 VIEW COMPARISON:  Previous studies including the examination done earlier today FINDINGS: There is interval placement of left subclavian central venous catheter with its tip in superior vena cava. Tip of endotracheal tube is  7 cm above the carina. NG tube is noted traversing the esophagus. There are 2 catheters in the course of thoracic esophagus. Large alveolar infiltrates are seen in right mid and both lower lung fields. Right lateral CP angle is indistinct. Left lateral CP angle appears clear. There is no pneumothorax. IMPRESSION: There is interval placement of left subclavian central venous catheter. There is no pneumothorax. Large alveolar infiltrates are seen in right parahilar region and both lower lung fields, more so on the right side suggesting multifocal pneumonia or asymmetric pulmonary edema. Electronically Signed   By: Ernie Avena M.D.   On: 08/18/2022 16:22   Portable Chest x-ray  Result Date: 08/18/2022 CLINICAL DATA:  Difficulty breathing, endotracheal tube placement EXAM: PORTABLE CHEST 1 VIEW COMPARISON:  Previous studies including the examination done on 08/17/2022 FINDINGS: Tip of endotracheal tube is 6.5 cm above the carina. Tip of NG tube is seen in the stomach. Transverse diameter of heart is increased. Alveolar densities are seen in right parahilar region and both lower  lung fields with interval worsening. Small to moderate right pleural effusion is seen. There is no pneumothorax. IMPRESSION: Alveolar densities are seen in right parahilar region and both lower lung fields with interval worsening suggesting worsening of asymmetric pulmonary edema and possibly worsening of multifocal pneumonia. There is small to moderate right pleural effusion. Electronically Signed   By: Ernie Avena M.D.   On: 08/18/2022 13:27   [place] EKG SITE RITE  Result Date: 08/18/2022 If Site Rite image not attached, placement could not be confirmed due to current cardiac rhythm.  ECHOCARDIOGRAM COMPLETE  Result Date: 08/17/2022    ECHOCARDIOGRAM REPORT   Patient Name:   Gene [name] Date of Exam: 08/17/2022 Medical Rec #:  [id number]            Height:       69.0 in Accession #:    [id number]           Weight:       197.8 lb Date of Birth:  January 13, 1941            BSA:          2.056 m Patient Age:    81 years             BP:           96/54 mmHg Patient Gender: M                    HR:           74 bpm. Exam Location:  Inpatient Procedure: 2D Echo, Cardiac Doppler, Color Doppler and Intracardiac            Opacification Agent Indications:    CHF - Acute Systolic  History:        Patient has prior history of Echocardiogram examinations, most                 recent 04/13/2022. CHF, Previous Myocardial Infarction, PAD and                 Carotid Disease, Signs/Symptoms:Dyspnea, Edema and Chest Pain;                 Risk Factors:Dyslipidemia and Hypertension.  Sonographer:    Wallie Char Referring Phys: [phone] PREETHA JOSEPH  Sonographer Comments: Technically difficult study due to poor echo windows. IMPRESSIONS  1. Left ventricular ejection fraction, by estimation, is 35%. The left ventricle has moderately decreased  function. The left ventricle demonstrates global hypokinesis. There is mild concentric left ventricular hypertrophy. Left ventricular diastolic parameters are consistent with Grade II  diastolic dysfunction (pseudonormalization). Elevated left atrial pressure.  2. Right ventricular systolic function is normal. The right ventricular size is normal. There is mildly elevated pulmonary artery systolic pressure. The estimated right ventricular systolic pressure is 44.2 mmHg.  3. Left atrial size was severely dilated.  4. Right atrial size was mildly dilated.  5. The mitral valve is degenerative. Trivial mitral valve regurgitation.  6. The aortic valve is not well visualized but appears severely calcified and thickened. There is at least mild to moderate low flow, low gradient AS with mean gradient , Vmax 2.2, DI 0.3, AVA 1.0cm2 SVi 13.5 (was previously mild to moderate on TTE 04/2022). The aortic valve is calcified. There is severe calcifcation of the aortic valve. There is severe thickening of the aortic valve. Aortic valve regurgitation is mild.  7. The inferior vena cava is dilated in size with <50% respiratory variability, suggesting right atrial pressure of 15 mmHg. Comparison(s): Compared to prior TTE in 04/2022, the LVEF has dropped from 60-65% to 35% with global hypokinesis. Filling pressures are elevated on current study. FINDINGS  Left Ventricle: Left ventricular ejection fraction, by estimation, is 35%. The left ventricle has moderately decreased function. The left ventricle demonstrates global hypokinesis. Definity contrast agent was given IV to delineate the left ventricular endocardial borders. The left ventricular internal cavity size was normal in size. There is mild concentric left ventricular hypertrophy. Left ventricular diastolic parameters are consistent with Grade II diastolic dysfunction (pseudonormalization). Elevated left atrial pressure. Right Ventricle: The right ventricular size is normal. No increase in right ventricular wall thickness. Right ventricular systolic function is normal. There is mildly elevated pulmonary artery systolic pressure. The tricuspid regurgitant  velocity is 2.70  m/s, and with an assumed right atrial pressure of 15 mmHg, the estimated right ventricular systolic pressure is 44.2 mmHg. Left Atrium: Left atrial size was severely dilated. Right Atrium: Right atrial size was mildly dilated. Pericardium: There is no evidence of pericardial effusion. Mitral Valve: The mitral valve is degenerative in appearance. There is mild thickening of the mitral valve leaflet(s). Trivial mitral valve regurgitation. MV peak gradient, 5.1 mmHg. The mean mitral valve gradient is 1.0 mmHg. Tricuspid Valve: The tricuspid valve is normal in structure. Tricuspid valve regurgitation is trivial. Aortic Valve: The aortic valve is not well visualized but appears severely calcified and thickened. There is at least mild to moderate low flow, low gradient AS with mean gradient , Vmax 2.2, DI 0.3, AVA 1.0cm2 SVi 13.5 (was previously mild to moderate on TTE 04/2022). The aortic valve is calcified. There is severe calcifcation of the aortic valve. There is severe thickening of the aortic valve. Aortic valve regurgitation is mild. Aortic regurgitation PHT measures 368 msec. Aortic valve mean gradient measures 10.8 mmHg. Aortic valve peak gradient measures 17.8 mmHg. Aortic valve area, by VTI measures 0.56 cm. Pulmonic Valve: The pulmonic valve was not well visualized. Pulmonic valve regurgitation is trivial. Aorta: The aortic root is normal in size and structure. Venous: The inferior vena cava is dilated in size with less than 50% respiratory variability, suggesting right atrial pressure of 15 mmHg. IAS/Shunts: The atrial septum is grossly normal.  LEFT VENTRICLE PLAX 2D LVIDd:         3.50 cm      Diastology LVIDs:         2.90 cm  LV e' medial:    5.65 cm/s LV PW:         1.50 cm      LV E/e' medial:  20.5 LV IVS:        1.10 cm      LV e' lateral:   5.49 cm/s LVOT diam:     1.50 cm      LV E/e' lateral: 21.1 LV SV:         25 LV SV Index:   12 LVOT Area:     1.77 cm  LV Volumes  (MOD) LV vol d, MOD A2C: 149.0 ml LV vol d, MOD A4C: 126.0 ml LV vol s, MOD A2C: 92.6 ml LV vol s, MOD A4C: 80.9 ml LV SV MOD A2C:     56.4 ml LV SV MOD A4C:     126.0 ml LV SV MOD BP:      49.7 ml RIGHT VENTRICLE             IVC RV S prime:     13.60 cm/s  IVC diam: 2.50 cm TAPSE (M-mode): 1.9 cm LEFT ATRIUM             Index        RIGHT ATRIUM           Index LA diam:        3.40 cm 1.65 cm/m   RA Area:     26.10 cm LA Vol (A2C):   89.2 ml 43.38 ml/m  RA Volume:   87.80 ml  42.70 ml/m LA Vol (A4C):   91.0 ml 44.26 ml/m LA Biplane Vol: 99.1 ml 48.20 ml/m  AORTIC VALVE AV Area (Vmax):    0.57 cm AV Area (Vmean):   0.55 cm AV Area (VTI):     0.56 cm AV Vmax:           211.00 cm/s AV Vmean:          155.750 cm/s AV VTI:            0.442 m AV Peak Grad:      17.8 mmHg AV Mean Grad:      10.8 mmHg LVOT Vmax:         67.55 cm/s LVOT Vmean:        48.450 cm/s LVOT VTI:          0.139 m LVOT/AV VTI ratio: 0.31 AI PHT:            368 msec  AORTA Ao Root diam: 3.50 cm Ao Asc diam:  3.00 cm MITRAL VALVE                TRICUSPID VALVE MV Area (PHT): 4.89 cm     TR Peak grad:   29.2 mmHg MV Area VTI:   0.89 cm     TR Vmax:        270.00 cm/s MV Peak grad:  5.1 mmHg MV Mean grad:  1.0 mmHg     SHUNTS MV Vmax:       1.13 m/s     Systemic VTI:  0.14 m MV Vmean:      54.2 cm/s    Systemic Diam: 1.50 cm MV Decel Time: 155 msec MV E velocity: 116.00 cm/s MV A velocity: 52.80 cm/s MV E/A ratio:  2.20 Laurance Flatten MD Electronically signed by Laurance Flatten MD Signature Date/Time: 08/17/2022/12:15:50 PM    Final     Cardiac Studies   Echo from 08/17/22:  1. Left ventricular ejection fraction, by estimation, is 35%. The left  ventricle has moderately decreased function. The left ventricle  demonstrates global hypokinesis. There is mild concentric left ventricular  hypertrophy. Left ventricular diastolic  parameters are consistent with Grade II diastolic dysfunction  (pseudonormalization). Elevated left  atrial pressure.   2. Right ventricular systolic function is normal. The right ventricular  size is normal. There is mildly elevated pulmonary artery systolic  pressure. The estimated right ventricular systolic pressure is 44.2 mmHg.   3. Left atrial size was severely dilated.   4. Right atrial size was mildly dilated.   5. The mitral valve is degenerative. Trivial mitral valve regurgitation.   6. The aortic valve is not well visualized but appears severely calcified  and thickened. There is at least mild to moderate low flow, low gradient  AS with mean gradient , Vmax 2.2, DI 0.3, AVA 1.0cm2 SVi 13.5 (was  previously mild to moderate on  TTE 04/2022). The aortic valve is calcified. There is severe calcifcation  of the aortic valve. There is severe thickening of the aortic valve.  Aortic valve regurgitation is mild.   7. The inferior vena cava is dilated in size with <50% respiratory  variability, suggesting right atrial pressure of 15 mmHg.   Comparison(s): Compared to prior TTE in 04/2022, the LVEF has dropped from  60-65% to 35% with global hypokinesis. Filling pressures are elevated on  current study.    Patient Profile     82 y.o. male with PMH of PAD, right BKA, left transmetatarsal amputation, aortic stenosis, dyslipidemia, anxiety, depression, peripheral neuropathy,  who presented with DOE, orthopnea, leg edema, and he is currently admitted for NSTEMI, acute systolic and diastolic heart failure.   Assessment & Plan   NSTEMI    Peak trop >24,000 on 6/10/   Yesterday19,420 Echo this admission showed moderate LV dysfunction New from previous this year. Plan for R/L heart cath when improves   Keep on ASA  HFrEF     LVEF 35% on echo this admit    CVP 10 earlier today Remains on levophed  Pt has been dosed with intermitt lasix    COOX ordered  Pulmonary    Pt currently intubated   On 100% FiO2 On ABX    BAL yesterday       Repeat CXR pending today       PAF     PT with intermitt episodes of afib    Rates not bad Follow for now   If continues consider amiodarone though with pulmonary problems hope to avoid NOt on anticoagulatoin with coffee ground gasatric draine    Mild to moderate aortic stenosis -Echo from 6/10 revealed severely calcified aortic valve with mild to moderate low flow, low gradient AS with mean gradient , Vmax 2.2, DI 0.3, AVA 1.0cm2 SVi 13.5 -Avoid hypotension  AKI -Creatinine 0.81 on 07/14/2022,  Has been rising since admit  Today 2.47  Taper pressors as BP tolerates    COOX pending     Heme  Hgb stable at 12.4        For questions or updates, please contact [facility] Please consult [email or web address] for contact info under        Signed, Dietrich Pates, MD  08-30-22, 6:58 AM

## 2022-08-19 NOTE — Progress Notes (Addendum)
eLink Physician-Brief Progress Note Patient Name: Gene [name] DOB: 1940/08/26 MRN: [id number]   Date of Service  08/19/2022  HPI/Events of Note  Hyperglycemia  eICU Interventions  Sliding scale insulin every 4 hours   0534 - potassium 3.5, cret 2.42. KCL OG ordered   Intervention Category Intermediate Interventions: Hyperglycemia - evaluation and treatment  Dravin Lance 08/19/2022, 11:46 PM

## 2022-08-19 NOTE — Progress Notes (Signed)
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  Pulmonary critical care called to evaluate he will be transferred to intensive care unit with a high probability of intubation in the near future.  Pertinent  Medical History   Past Medical History:  Diagnosis Date   Anemia    "just in 2016 when I was bleeding internally" (08/28/2015)   Anxiety    Aortic stenosis    Arthritis    Cervical spondylolysis    Depression    Diabetic ulcer of left foot (HCC) 08/28/2015   pt states he does not have diabetes   Family history of anesthesia complication    "father would start seeing things"   Heart murmur    "related to the aortic stenosis" (08/28/2015)   History of alcohol abuse    History of blood transfusion    "just in 2016 when I was bleeding internally" (08/28/2015)   Hyperlipidemia    high TG   Hypertension    Lesion of ulnar nerve 07/22/2012   Bilateral ulnar neuropathies   Neuropathy    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  Still on pressors intubated on mechanical life support.  Objective    Blood pressure 93/62, pulse 91, temperature 98.8 F (37.1 C), temperature source Axillary, resp. rate 10, height 5\' 9"  (1.753 m), weight 83.7 kg, SpO2 98 %. CVP:  [7 mmHg-16 mmHg] 15 mmHg  Vent Mode: PRVC FiO2 (%):  [70 %-100 %] 70 % Set Rate:  [10 bmp-22 bmp] 10 bmp Vt Set:  [560 mL] 560 mL PEEP:  [8 cmH20] 8 cmH20 Plateau Pressure:  [21 cmH20-22 cmH20] 21 cmH20   Intake/Output Summary (Last 24 hours) at 08/11/2022 0958 Last data filed at 08/10/2022 0700 Gross per 24 hour  Intake 1092.76 ml  Output 633 ml  Net 459.76 ml   Filed Weights   08/27/2022 1240 08/17/2022 1949 08/18/22 2302  Weight: 89.8 kg 89.7 kg 83.7 kg    Examination: General: Chronically ill-appearing elderly gentleman intubated on mechanical life support critically ill HENT: NCAT, tracking appropriately Lungs: Bilateral mechanically ventilated breath sounds Cardiovascular: Regular rate rhythm, S1-S2 Abdomen: Soft, nontender nondistended Extremities: Right BKA, left forefoot amputation Neuro: Sedated mechanical support, RASS -2 GU: Deferred  Resolved Hospital Problem list     Assessment & Plan:   Acute hypoxic respiratory failure  Acute on chronic systolic heart failure Pulmonary edema Right-sided lower lobe aspiration pneumonia Plan: Remains on full mechanical vent support Continue wean PEEP and FiO2 to maintain sats greater than 90 Adult mechanical vent protocol, chest x-ray as needed VAP prophylaxis He is alkalotic on his recent arterial blood gas.  His respiratory rate has continued to be dropped.  He is down to a rate of 10.  However continues to draw large breaths which increases his minute ventilation and blowing off CO2.  His bicarb is normal. 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 Maintain adequate blood flow to all extremities  Diabetes mellitus CBG (last 3)  Recent Labs    08/18/22 1304  GLUCAP 158*   P: Continue SSI and cbgs   AKI - follow uop and I&O  Hyponatremia - improved   NSTEMI  PAF - possible RHC and LHC if improves Keep on ASA  Holding ac in the setting of bloody gi output from NGT  Hemoglobin is stable    Best Practice (right click and "Reselect all SmartList Selections" daily)   Diet/type: NPO DVT prophylaxis: systemic heparin GI prophylaxis: PPI Lines: N/A Foley:  N/A Code Status:  full code Last date of multidisciplinary goals of care discussion [tbd]  Requested intubation Labs   CBC: Recent Labs  Lab 08/30/2022 1242 08/17/22 0312 08/18/22 0246 08/18/22 0948 08/18/22 1301 08/10/2022 0403 08/09/2022 0444 08/10/2022 0532 08/15/2022 0741  WBC 6.7 7.0 7.2  --   --  11.2*  --   --   --   HGB 13.1 13.0 12.5*   < > 12.6* 12.4* 11.9* 11.9* 11.2*  HCT 38.2* 37.2* 35.8*   < > 37.0* 35.5* 35.0* 35.0* 33.0*  MCV 91.2 87.5 89.5  --   --  84.3  --   --   --   PLT 169 190 170  --   --  224  --   --   --    < > = values in this interval not displayed.    Basic Metabolic Panel: Recent Labs  Lab 08/27/2022 1242 08/17/22 0312 08/17/22 1437 08/18/22 0246 08/18/22 0948 08/18/22 1455 09/02/2022 0403 08/11/2022 0444 09/06/2022 0532 08/18/2022 0741  NA 128* 128* 129* 132*   < > 137 137 135 136 135  K 3.9 4.2 4.3 3.9   < > 3.6 3.1* 3.1* 3.2* 3.7  CL 95* 95* 93* 96*  --  91* 95*  --   --   --   CO2 17* 15* 17* 19*  --  22 25  --   --   --   GLUCOSE 97 80 95 90  --  204* 150*  --   --   --   BUN 15 19 27* 34*  --  47* 49*  --   --   --   CREATININE 1.39* 1.50* 1.62* 1.62*  --  2.29* 2.47*  --   --   --   CALCIUM 8.9 8.7* 9.1 8.9  --  9.0 8.5*  --   --   --   MG 1.7  --   --   --   --   --   --   --   --   --    < > = values in this interval not displayed.   GFR: Estimated Creatinine Clearance: 23.9 mL/min (A) (by C-G formula based  on SCr of 2.47 mg/dL (H)). Recent Labs  Lab 09/01/2022 1242 08/17/22 0312 08/17/22 0435 08/18/22 0246 08/18/22 0823 08/18/22 1455 08/19/22 0403  PROCALCITON  --   --  <  0.10  --   --   --   --   WBC 6.7 7.0  --  7.2  --   --  11.2*  LATICACIDVEN  --   --   --   --  2.2* 2.8*  --     Liver Function Tests: Recent Labs  Lab 08/17/22 0312  AST 243*  ALT 46*  ALKPHOS 30*  BILITOT 1.6*  PROT 5.7*  ALBUMIN 3.2*   No results for input(s): "LIPASE", "AMYLASE" in the last 168 hours. No results for input(s): "AMMONIA" in the last 168 hours.  ABG    Component Value Date/Time   PHART 7.603 (HH) 08/22/2022 0741   PCO2ART 26.6 (L) 08/24/2022 0741   PO2ART 232 (H) 08/13/2022 0741   HCO3 26.3 08/22/2022 0741   TCO2 27 08/16/2022 0741   ACIDBASEDEF 9.0 (H) 08/18/2022 1301   O2SAT 60.1 08/23/2022 0930     Coagulation Profile: No results for input(s): "INR", "PROTIME" in the last 168 hours.  Cardiac Enzymes: No results for input(s): "CKTOTAL", "CKMB", "CKMBINDEX", "TROPONINI" in the last 168 hours.  HbA1C: Hgb A1c MFr Bld  Date/Time Value Ref Range Status  07/14/2022 04:40 PM 5.5 4.6 - 6.5 % Final    Comment:    Glycemic Control Guidelines for People with Diabetes:Non Diabetic:  <6%Goal of Therapy: <7%Additional Action Suggested:  >8%   10/14/2021 02:47 PM 5.6 4.6 - 6.5 % Final    Comment:    Glycemic Control Guidelines for People with Diabetes:Non Diabetic:  <6%Goal of Therapy: <7%Additional Action Suggested:  >8%     CBG: Recent Labs  Lab 08/18/22 1304  GLUCAP 158*    Review of Systems:   10 point review of system taken, please see HPI for positives and negatives.   Past Medical History:  He,  has a past medical history of Anemia, Anxiety, Aortic stenosis, Arthritis, Cervical spondylolysis, Depression, Diabetic ulcer of left foot (HCC) (08/28/2015), Family history of anesthesia complication, Heart murmur, History of alcohol abuse, History of blood transfusion,  Hyperlipidemia, Hypertension, Lesion of ulnar nerve (07/22/2012), Neuropathy, Osteomyelitis of ankle and foot (HCC) (10/2013), PVD (peripheral vascular disease) (HCC), and Ulcer of esophagus (2016).   Surgical History:   Past Surgical History:  Procedure Laterality Date   AMPUTATION Left 11/20/13 and 11/24/13   middle   AMPUTATION Left 01/03/2014   Procedure: Left Transmetatarsal Amputation;  Surgeon: Nadara Mustard, MD;  Location: [facility] OR;  Service: Orthopedics;  Laterality: Left;   AMPUTATION Right 07/27/2014   Procedure: Right 4th Ray Amputation;  Surgeon: Nadara Mustard, MD;  Location: [facility] OR;  Service: Orthopedics;  Laterality: Right;   AMPUTATION Right 09/12/2014   Procedure: RIGHT TRANSMETATARSAL AMPUTATION;  Surgeon: Nadara Mustard, MD;  Location: [facility] OR;  Service: Orthopedics;  Laterality: Right;   AMPUTATION Right 09/28/2014   Procedure: Right Below Knee Amputation;  Surgeon: Nadara Mustard, MD;  Location: [facility] OR;  Service: Orthopedics;  Laterality: Right;   CATARACT EXTRACTION W/ INTRAOCULAR LENS  IMPLANT, BILATERAL Bilateral 05/2015   ESOPHAGOGASTRODUODENOSCOPY N/A 08/16/2014   Procedure: ESOPHAGOGASTRODUODENOSCOPY (EGD);  Surgeon: Vida Rigger, MD;  Location: [facility] ENDOSCOPY;  Service: Endoscopy;  Laterality: N/A;   ESOPHAGOGASTRODUODENOSCOPY (EGD) WITH PROPOFOL N/A 08/17/2014   Procedure: ESOPHAGOGASTRODUODENOSCOPY (EGD) WITH PROPOFOL;  Surgeon: Graylin Shiver, MD;  Location: [facility] ENDOSCOPY;  Service: Endoscopy;  Laterality: N/A;   LAPAROSCOPIC CHOLECYSTECTOMY     TONSILLECTOMY     TRANSMETATARSAL AMPUTATION Left 10/2013     Social History:   reports  that he has never smoked. He has never used smokeless tobacco. He reports current alcohol use. He reports that he does not use drugs.   Family History:  His family history includes Colon polyps in his father; Hyperlipidemia in his mother; Lung cancer in his father. There is no history of Diabetes, Coronary artery disease, Prostate cancer, or Colon cancer.    Allergies No Known Allergies   Home Medications  Prior to Admission medications   Medication Sig Start Date End Date Taking? Authorizing Provider  ALPRAZolam (XANAX) 0.5 MG tablet TAKE 1 TABLET(0.5 MG) BY MOUTH THREE TIMES DAILY AS NEEDED FOR ANXIETY Patient taking differently: Take 0.5 mg by mouth 3 (three) times daily as needed for anxiety. 07/07/22  Yes Paz, Nolon Rod, MD  amLODipine (NORVASC) 5 MG tablet Take 1 tablet (5 mg total) daily by mouth. 01/15/17  Yes Paz, Nolon Rod, MD  Ascorbic Acid (VITAMIN C) 1000 MG tablet Take 3,000 mg by mouth daily.   Yes [provider]  atorvastatin (LIPITOR) 80 MG tablet Take 1 tablet (80 mg total) by mouth at bedtime. 07/17/22  Yes Paz, Nolon Rod, MD  citalopram (CELEXA) 40 MG tablet Take 1 tablet (40 mg total) by mouth daily. 07/27/22  Yes Paz, Nolon Rod, MD  clopidogrel (PLAVIX) 75 MG tablet Take 75 mg by mouth daily.   Yes [provider]  cycloSPORINE (RESTASIS) 0.05 % ophthalmic emulsion Place 2 drops into both eyes daily as needed (eyes).   Yes [provider]  fenofibrate 160 MG tablet Take 1 tablet (160 mg total) by mouth daily. 03/04/22  Yes Paz, Nolon Rod, MD  losartan (COZAAR) 100 MG tablet Take 1 tablet (100 mg total) by mouth daily. 02/15/19  Yes Paz, Nolon Rod, MD  Multiple Vitamin (MULTIVITAMIN WITH MINERALS) TABS tablet Take 1 tablet by mouth daily.   Yes [provider]  pregabalin (LYRICA) 150 MG capsule Take 150 mg by mouth in the morning, at noon, and at bedtime.   Yes [provider]  Propylene Glycol (SYSTANE BALANCE OP) Apply 1 drop to eye 2 (two) times daily as needed (dry eyes). Reported on 08/27/2015   Yes [provider]  Psyllium (METAMUCIL PO) Take 5 capsules by mouth daily.    Yes [provider]  pyridOXINE (VITAMIN B-6) 100 MG tablet Take 100 mg by mouth daily.   Yes [provider]  RA ASPIRIN EC 81 MG EC tablet Take 81 mg by mouth daily. 12/06/14  Yes [provider]  thiamine 50 MG tablet Take 50 mg by mouth daily.   Yes [provider]  traZODone (DESYREL) 100 MG tablet Take 100 mg by mouth at bedtime.   Yes [provider]  vitamin B-12 (CYANOCOBALAMIN) 1000 MCG tablet Take 2,000 mcg by mouth daily.    Yes [provider]  mupirocin ointment (BACTROBAN) 2 % Apply 1 application topically 2 (two) times daily. Patient not taking: Reported on 08-30-2022 09/06/20   [name], MD    This patient is critically ill with multiple organ system failure; which, requires frequent high complexity decision making, assessment, support, evaluation, and titration of therapies. This was completed through the application of advanced monitoring technologies and extensive interpretation of multiple databases. During this encounter critical care time was devoted to patient care services described in this note for 32 minutes.  [name], DO [facility] 08/18/2022 9:59 AM

## 2022-08-19 NOTE — Progress Notes (Signed)
Family reports pt is a 5-6 daily beer drinker.  Currently is on precedex, thiamine, MVI, and folate added.    - cont to monitor for ETOH withdrawals.       Posey Boyer, MSN, NP, AG-ACNP-BC [facility] 08/19/2022, 7:03 PM  See Amion for pager If no response to pager , please call [phone] until 7pm After 7:00 pm call Elink  [phone]

## 2022-08-19 NOTE — Progress Notes (Signed)
Initial Nutrition Assessment  DOCUMENTATION CODES:  Not applicable  INTERVENTION:  Trickle tube feeds via OG-tube: Vital 1.5 at 20 mL/hr Once able, recommend advancing by 10 mL q8h to goal rate of 50 mL/hr (1200 mL per day) 60 mL ProSource TF20 - BID Tube feeds at goal provides 1960 kcal, 121 gm of protein, and 917 mL free water daily. Monitor magnesium, potassium, and phosphorus BID for at least 3 days, MD to replete as needed, as pt is at risk for refeeding syndrome given NPO x 3 days and poor PO intake PTA. Continue Folic acid, Thiamine, and Multivitamin w/ minerals daily  NUTRITION DIAGNOSIS:  Inadequate oral intake related to inability to eat as evidenced by NPO status.  GOAL:  Patient will meet greater than or equal to 90% of their needs  MONITOR:  Vent status, Labs, I & O's, TF tolerance  REASON FOR ASSESSMENT:  Ventilator    ASSESSMENT:  82 y.o. male presented to the ED with worsening dyspnea on exertion. PMH includes R BKA, PAD, HLD, HTN, CHF, and anxiety and depression. Pt admitted with NSTEMI, acute CHF, and acute hypoxic respiratory failure.   6/09 - Admitted 6/10 - on BiPAP 6/11 - transferred to ICU 2/2 worsening respiratory status; intubated; NGT - LIWS  Pt ex-wife at bedside. Able to provide some history. Reports that pt ate 3-4 eggs, 2 slices of toast and 2 slices of bacon and then just has snacks the rest of the day. Reports that he takes a MVI, Vitamin D and Vitamin C supplementation at home. Shares pt have EtOH use.   Discussed with RN, no significant output from NG tube.  Reached out to MD about enteral nutrition. MD ok with starting trickle feeds.   Patient is currently intubated on ventilator support. MV: 7.6 L/min MAP (cuff): 85 Temp (24hrs), Avg:99.5 F (37.5 C), Min:98.2 F (36.8 C), Max:100.4 F (38 C)  Drips Precedex Levophed Vasopressin  Medications reviewed and include: Vitamin B12, MVI, Colace, Protonix, Miralax, Vitamin B6, Thiamine,  IV antibiotics Labs reviewed: Sodium 135, Potassium 3.7, BUN 49, Creatinine 2.47  UOP: 933 mL x 24 hrs  NUTRITION - FOCUSED PHYSICAL EXAM:  Flowsheet Row Most Recent Value  Orbital Region Unable to assess  Upper Arm Region No depletion  Thoracic and Lumbar Region No depletion  Buccal Region Unable to assess  Temple Region No depletion  Clavicle Bone Region Mild depletion  Clavicle and Acromion Bone Region Mild depletion  Scapular Bone Region Mild depletion  Dorsal Hand Unable to assess  Patellar Region Mild depletion  Anterior Thigh Region Mild depletion  Posterior Calf Region Unable to assess  Edema (RD Assessment) None  Hair Reviewed  Eyes Unable to assess  Mouth Unable to assess  Skin Reviewed  Nails Unable to assess   Diet Order:   Diet Order             Diet NPO time specified  Diet effective now                  EDUCATION NEEDS: Not appropriate for education at this time  Skin:  Skin Assessment: Reviewed RN Assessment  Last BM:  6/9  Height:  Ht Readings from Last 1 Encounters:  08/18/22 5\' 9"  (1.753 m)   Weight:  Wt Readings from Last 1 Encounters:  08/18/22 83.7 kg   Ideal Body Weight:  72.7 kg  BMI:  Body mass index is 27.25 kg/m.  Estimated Nutritional Needs:  Kcal:  1900-2100 Protein:  95-115  grams Fluid:  >/= 1.9 L   Kirby Crigler RD, LDN Clinical Dietitian See [facility] for contact information.

## 2022-08-20 DIAGNOSIS — I214 Non-ST elevation (NSTEMI) myocardial infarction: Secondary | ICD-10-CM | POA: Diagnosis not present

## 2022-08-20 LAB — BASIC METABOLIC PANEL
Anion gap: 15 (ref 5–15)
BUN: 67 mg/dL — ABNORMAL HIGH (ref 8–23)
CO2: 24 mmol/L (ref 22–32)
Calcium: 8.1 mg/dL — ABNORMAL LOW (ref 8.9–10.3)
Chloride: 97 mmol/L — ABNORMAL LOW (ref 98–111)
Creatinine, Ser: 2.42 mg/dL — ABNORMAL HIGH (ref 0.61–1.24)
GFR, Estimated: 26 mL/min — ABNORMAL LOW (ref >60)
Glucose, Bld: 160 mg/dL — ABNORMAL HIGH (ref 70–99)
Potassium: 3.5 mmol/L (ref 3.5–5.1)
Sodium: 136 mmol/L (ref 135–145)

## 2022-08-20 LAB — COOXEMETRY PANEL
Carboxyhemoglobin: 0.9 % (ref 0.5–1.5)
Carboxyhemoglobin: 1.3 % (ref 0.5–1.5)
Methemoglobin: <0.7 % (ref 0.0–1.5)
Methemoglobin: <0.7 % (ref 0.0–1.5)
O2 Saturation: 58.7 %
O2 Saturation: 58.9 %
Total hemoglobin: 9.5 g/dL — ABNORMAL LOW (ref 12.0–16.0)
Total hemoglobin: 10.4 g/dL — ABNORMAL LOW (ref 12.0–16.0)

## 2022-08-20 LAB — CBC
HCT: 32.2 % — ABNORMAL LOW (ref 39.0–52.0)
Hemoglobin: 11.1 g/dL — ABNORMAL LOW (ref 13.0–17.0)
MCH: 30 pg (ref 26.0–34.0)
MCHC: 34.5 g/dL (ref 30.0–36.0)
MCV: 87 fL (ref 80.0–100.0)
Platelets: 195 10*3/uL (ref 150–400)
RBC: 3.7 MIL/uL — ABNORMAL LOW (ref 4.22–5.81)
RDW: 14.8 % (ref 11.5–15.5)
WBC: 9 10*3/uL (ref 4.0–10.5)
nRBC: 0 % (ref 0.0–0.2)

## 2022-08-20 LAB — POCT I-STAT 7, (LYTES, BLD GAS, ICA,H+H)
Acid-Base Excess: 4 mmol/L — ABNORMAL HIGH (ref 0.0–2.0)
Acid-Base Excess: 2 mmol/L (ref 0.0–2.0)
Bicarbonate: 27 mmol/L (ref 20.0–28.0)
Bicarbonate: 25.5 mmol/L (ref 20.0–28.0)
Calcium, Ion: 1.05 mmol/L — ABNORMAL LOW (ref 1.15–1.40)
Calcium, Ion: 1.08 mmol/L — ABNORMAL LOW (ref 1.15–1.40)
HCT: 31 % — ABNORMAL LOW (ref 39.0–52.0)
HCT: 29 % — ABNORMAL LOW (ref 39.0–52.0)
Hemoglobin: 10.5 g/dL — ABNORMAL LOW (ref 13.0–17.0)
Hemoglobin: 9.9 g/dL — ABNORMAL LOW (ref 13.0–17.0)
O2 Saturation: 95 %
O2 Saturation: 93 %
Patient temperature: 100.4
Patient temperature: 99.3
Potassium: 3.6 mmol/L (ref 3.5–5.1)
Potassium: 3.5 mmol/L (ref 3.5–5.1)
Sodium: 136 mmol/L (ref 135–145)
Sodium: 137 mmol/L (ref 135–145)
TCO2: 28 mmol/L (ref 22–32)
TCO2: 27 mmol/L (ref 22–32)
pCO2 arterial: 35.7 mmHg (ref 32–48)
pCO2 arterial: 34.1 mmHg (ref 32–48)
pH, Arterial: 7.489 — ABNORMAL HIGH (ref 7.35–7.45)
pH, Arterial: 7.484 — ABNORMAL HIGH (ref 7.35–7.45)
pO2, Arterial: 75 mmHg — ABNORMAL LOW (ref 83–108)
pO2, Arterial: 61 mmHg — ABNORMAL LOW (ref 83–108)

## 2022-08-20 LAB — CULTURE, RESPIRATORY W GRAM STAIN

## 2022-08-20 LAB — GLUCOSE, CAPILLARY
Glucose-Capillary: 125 mg/dL — ABNORMAL HIGH (ref 70–99)
Glucose-Capillary: 142 mg/dL — ABNORMAL HIGH (ref 70–99)
Glucose-Capillary: 152 mg/dL — ABNORMAL HIGH (ref 70–99)
Glucose-Capillary: 168 mg/dL — ABNORMAL HIGH (ref 70–99)
Glucose-Capillary: 170 mg/dL — ABNORMAL HIGH (ref 70–99)
Glucose-Capillary: 205 mg/dL — ABNORMAL HIGH (ref 70–99)

## 2022-08-20 LAB — CULTURE, BLOOD (ROUTINE X 2): Special Requests: ADEQUATE

## 2022-08-20 LAB — HEPARIN LEVEL (UNFRACTIONATED): Heparin Unfractionated: 0.3 IU/mL (ref 0.30–0.70)

## 2022-08-20 LAB — PHOSPHORUS
Phosphorus: 4.6 mg/dL (ref 2.5–4.6)
Phosphorus: 3.7 mg/dL (ref 2.5–4.6)

## 2022-08-20 LAB — MAGNESIUM
Magnesium: 2.2 mg/dL (ref 1.7–2.4)
Magnesium: 2.3 mg/dL (ref 1.7–2.4)

## 2022-08-20 LAB — LACTIC ACID, PLASMA: Lactic Acid, Venous: 1.6 mmol/L (ref 0.5–1.9)

## 2022-08-20 MED ORDER — PHENYLEPHRINE HCL-NACL 20-0.9 MG/250ML-% IV SOLN
25.0000 ug/min | INTRAVENOUS | Status: DC
  Administered 2022-08-20: 100 ug/min via INTRAVENOUS
  Filled 2022-08-20: qty 250

## 2022-08-20 MED ORDER — VITAL 1.5 CAL PO LIQD
1000.0000 mL | ORAL | Status: DC
  Administered 2022-08-20: 1000 mL

## 2022-08-20 MED ORDER — SODIUM CHLORIDE 0.9 % IV SOLN: 250.0000 mL | INTRAVENOUS | Status: DC

## 2022-08-20 MED ORDER — EPINEPHRINE HCL 5 MG/250ML IV SOLN IN NS
INTRAVENOUS | Status: AC
  Administered 2022-08-20: 5 mg
  Filled 2022-08-20: qty 250

## 2022-08-20 MED ORDER — MILRINONE LACTATE IN DEXTROSE 20-5 MG/100ML-% IV SOLN
0.1250 ug/kg/min | INTRAVENOUS | Status: DC
  Administered 2022-08-20: 0.125 ug/kg/min via INTRAVENOUS
  Filled 2022-08-20: qty 100

## 2022-08-20 MED ORDER — HEPARIN BOLUS VIA INFUSION
2000.0000 [IU] | Freq: Once | INTRAVENOUS | Status: AC
  Administered 2022-08-20: 2000 [IU] via INTRAVENOUS
  Filled 2022-08-20: qty 2000

## 2022-08-20 MED ORDER — POTASSIUM CHLORIDE 20 MEQ PO PACK
40.0000 meq | PACK | Freq: Once | ORAL | Status: AC
  Administered 2022-08-20: 40 meq
  Filled 2022-08-20: qty 2

## 2022-08-20 MED ORDER — HEPARIN (PORCINE) 25000 UT/250ML-% IV SOLN
1100.0000 [IU]/h | INTRAVENOUS | Status: DC
  Administered 2022-08-20 – 2022-08-21 (×2): 1100 [IU]/h via INTRAVENOUS
  Filled 2022-08-20: qty 250

## 2022-08-20 MED ORDER — MILRINONE LACTATE IN DEXTROSE 20-5 MG/100ML-% IV SOLN: 0.2500 ug/kg/min | INTRAVENOUS | Status: DC

## 2022-08-20 MED ORDER — SORBITOL 70 % SOLN
30.0000 mL | Freq: Once | Status: AC
  Administered 2022-08-20: 30 mL
  Filled 2022-08-20: qty 30

## 2022-08-20 MED ORDER — PHENYLEPHRINE CONCENTRATED 100MG/250ML (0.4 MG/ML) INFUSION SIMPLE
25.0000 ug/min | INTRAVENOUS | Status: DC
  Administered 2022-08-21 (×2): 200 ug/min via INTRAVENOUS
  Filled 2022-08-20 (×2): qty 250

## 2022-08-20 MED ORDER — AMIODARONE HCL IN DEXTROSE 360-4.14 MG/200ML-% IV SOLN
60.0000 mg/h | INTRAVENOUS | Status: DC
  Administered 2022-08-20 (×2): 60 mg/h via INTRAVENOUS
  Filled 2022-08-20 (×2): qty 200

## 2022-08-20 NOTE — Progress Notes (Signed)
eLink Physician-Brief Progress Note Patient Name: Gene [name] DOB: [birth year] MRN: [id number]   Date of Service  08/20/2022  HPI/Events of Note  Notified of worsening hypotension despite max dose of levophed and fixed-dose vasopressin.  Sedation has been paused as well.   eICU Interventions  Will add phenylephrine to try and maintain MAP >65        [name] 08/20/2022, 10:43 PM

## 2022-08-20 NOTE — Progress Notes (Signed)
Discussed with ELINK: will attempt to get BP up with pressors prior to changing milrinone rate.

## 2022-08-20 NOTE — Progress Notes (Signed)
ANTICOAGULATION CONSULT NOTE   Pharmacy Consult for heparin  Indication: chest pain/ACS / Afib  No Known Allergies  Patient Measurements: Height: 5\' 9"  (175.3 cm) Weight: 84.3 kg (185 lb 13.6 oz) IBW/kg (Calculated) : 70.7 Heparin Dosing Weight: 88.8  Vital Signs: Temp: 99.3 F (37.4 C) (06/13 1545) Temp Source: Axillary (06/13 1545) BP: 125/79 (06/13 1845) Pulse Rate: 114 (06/13 1845)  Labs: Recent Labs    08/18/22 0246 08/18/22 0948 08/18/22 1455 08/18/22 1517 2022/09/01 0403 2022/09/01 0444 08/20/22 0405 08/20/22 0408 08/20/22 1701 08/20/22 1727  HGB 12.5*   < >  --   --  12.4*   < > 10.5* 11.1* 9.9*  --   HCT 35.8*   < >  --   --  35.5*   < > 31.0* 32.2* 29.0*  --   PLT 170  --   --   --  224  --   --  195  --   --   HEPARINUNFRC 0.54  --   --   --   --   --   --   --   --  0.30  CREATININE 1.62*  --  2.29*  --  2.47*  --   --  2.42*  --   --   TROPONINIHS  --   --   --  19,420*  --   --   --   --   --   --    < > = values in this interval not displayed.     Estimated Creatinine Clearance: 24.3 mL/min (A) (by C-G formula based on SCr of 2.42 mg/dL (H)).   Medical History: Past Medical History:  Diagnosis Date   Anemia    "just in 2016 when I was bleeding internally" (08/28/2015)   Anxiety    Aortic stenosis    Arthritis    Cervical spondylolysis    Depression    Diabetic ulcer of left foot (HCC) 08/28/2015   pt states he does not have diabetes   Family history of anesthesia complication    "father would start seeing things"   Heart murmur    "related to the aortic stenosis" (08/28/2015)   History of alcohol abuse    History of blood transfusion    "just in 2016 when I was bleeding internally" (08/28/2015)   Hyperlipidemia    high TG   Hypertension    Lesion of ulnar nerve 07/22/2012   Bilateral ulnar neuropathies   Neuropathy    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 EKG showing Afib, not on anticoag PTA. Trop elevation up to >24,000. Pharmacy consulted to dose heparin.   Heparin stopped 6/11 due to coffee ground emesis and this has resolved with CBC stable. Plans are to resume heparin and okay to bolus per CCM Heparin drip 1100 uts/hr with heparin level 0.3 at goal   Goal of Therapy:  Heparin level 0.3-0.7 units/ml Monitor platelets by anticoagulation protocol: Yes   Plan:  -continue Heparin drip 1100 uts/hr  Daily heparin level and cbc  Monitor s/s bleeding     Leota Sauers Pharm.D. CPP, BCPS Clinical Pharmacist [phone] 08/20/2022 7:11 PM   **Pharmacist phone directory can now be  found on [email or web address] ([email or web address]).  Listed under [facility].

## 2022-08-20 NOTE — Progress Notes (Addendum)
Rounding Note    Patient Name: Gene [name] Date of Encounter: 08/20/2022  [facility] Cardiologist: Verne Carrow, MD   Subjective  Pt remains intubated, sedated   ON pressors  Heparin added  Inpatient Medications    Scheduled Meds:  aspirin  81 mg Per Tube Daily   atorvastatin  80 mg Per Tube QHS   Chlorhexidine Gluconate Cloth  6 each Topical Daily   citalopram  40 mg Per Tube Daily   cyanocobalamin  2,000 mcg Per Tube Daily   docusate  100 mg Per Tube BID   feeding supplement (PROSource TF20)  60 mL Per Tube BID   feeding supplement (VITAL 1.5 CAL)  1,000 mL Per Tube Q24H   folic acid  1 mg Per Tube Daily   insulin aspart  0-15 Units Subcutaneous Q4H   midazolam  2 mg Intravenous Once   multivitamin with minerals  1 tablet Oral Daily   mouth rinse  15 mL Mouth Rinse Q2H   pantoprazole (PROTONIX) IV  40 mg Intravenous Q12H   polyethylene glycol  17 g Per Tube Daily   pregabalin  100 mg Per Tube TID   pyridOXINE  100 mg Per Tube Daily   thiamine  100 mg Per Tube Daily   traZODone  100 mg Per Tube QHS   Continuous Infusions:  sodium chloride Stopped (08/20/2022 0607)   dexmedetomidine (PRECEDEX) IV infusion 1.2 mcg/kg/hr (08/20/22 1200)   epoprostenol (VELETRI) for inhalation 1.5mg /3mL (30,000 ng/mL) 50 ng/kg/min (08/20/22 0818)   heparin 1,100 Units/hr (08/20/22 1200)   norepinephrine (LEVOPHED) Adult infusion 22 mcg/min (08/20/22 1200)   piperacillin-tazobactam (ZOSYN)  IV 12.5 mL/hr at 08/20/22 1200   vasopressin 0.03 Units/min (08/20/22 1200)   PRN Meds: acetaminophen, ALPRAZolam, fentaNYL (SUBLIMAZE) injection, fentaNYL (SUBLIMAZE) injection, mouth rinse   Vital Signs    Vitals:   08/20/22 1145 08/20/22 1200 08/20/22 1215 08/20/22 1230  BP: 116/74 105/78 113/89 109/82  Pulse: (!) 115 (!) 120 (!) 116 (!) 117  Resp: 17 17 16 14   Temp:      TempSrc:      SpO2: 97% 96% 97% 98%  Weight:      Height:        Intake/Output Summary  (Last 24 hours) at 08/20/2022 1329 Last data filed at 08/20/2022 1200 Gross per 24 hour  Intake 1919.64 ml  Output 1030 ml  Net 889.64 ml      08/20/2022    4:46 AM 08/18/2022   11:02 PM 2022-09-11    7:49 PM  Last 3 Weights  Weight (lbs) 185 lb 13.6 oz 184 lb 8.4 oz 197 lb 12 oz  Weight (kg) 84.3 kg 83.7 kg 89.7 kg      Telemetry    SR and atrial fib (rates 100s)  - Personally Reviewed  ECG    No new today  - Personally Reviewed  Physical Exam   GEN: Pt intubated Neck: Full Cardiac: Irreg irreg   No S3    Respiratory:Coarse BS R sided fields   GI: Soft MS: s/p R BKA, LLE no edema   Extrem warmer   Labs    High Sensitivity Troponin:   Recent Labs  Lab 11-Sep-2022 1242 Sep 11, 2022 1444 08/17/22 0435 08/18/22 1517  TROPONINIHS 6,646* 10,311* >24,000* 19,420*     Chemistry Recent Labs  Lab 09-11-22 1242 08/17/22 0312 08/17/22 1437 08/18/22 1455 08/16/2022 0403 08/23/2022 0444 08/20/2022 1604 08/17/2022 1719 08/20/22 0405 08/20/22 0408  NA 128* 128*   < >  137 137   < >  --  136 136 136  K 3.9 4.2   < > 3.6 3.1*   < >  --  3.9 3.6 3.5  CL 95* 95*   < > 91* 95*  --   --   --   --  97*  CO2 17* 15*   < > 22 25  --   --   --   --  24  GLUCOSE 97 80   < > 204* 150*  --   --   --   --  160*  BUN 15 19   < > 47* 49*  --   --   --   --  67*  CREATININE 1.39* 1.50*   < > 2.29* 2.47*  --   --   --   --  2.42*  CALCIUM 8.9 8.7*   < > 9.0 8.5*  --   --   --   --  8.1*  MG 1.7  --   --   --   --   --  2.1  --   --  2.2  PROT  --  5.7*  --   --   --   --   --   --   --   --   ALBUMIN  --  3.2*  --   --   --   --   --   --   --   --   AST  --  243*  --   --   --   --   --   --   --   --   ALT  --  46*  --   --   --   --   --   --   --   --   ALKPHOS  --  30*  --   --   --   --   --   --   --   --   BILITOT  --  1.6*  --   --   --   --   --   --   --   --   GFRNONAA 51* 47*   < > 28* 26*  --   --   --   --  26*  ANIONGAP 16* 18*   < > 24* 17*  --   --   --   --  15   < > = values  in this interval not displayed.    Lipids No results for input(s): "CHOL", "TRIG", "HDL", "LABVLDL", "LDLCALC", "CHOLHDL" in the last 168 hours.  Hematology Recent Labs  Lab 08/18/22 0246 08/18/22 0948 09/06/2022 0403 08/14/2022 0444 08/18/2022 1750 08/20/22 0405 08/20/22 0408  WBC 7.2  --  11.2*  --   --   --  9.0  RBC 4.00*  --  4.21*  --   --   --  3.70*  HGB 12.5*   < > 12.4*   < > 11.3* 10.5* 11.1*  HCT 35.8*   < > 35.5*   < > 33.2* 31.0* 32.2*  MCV 89.5  --  84.3  --   --   --  87.0  MCH 31.3  --  29.5  --   --   --  30.0  MCHC 34.9  --  34.9  --   --   --  34.5  RDW 14.3  --  14.5  --   --   --  14.8  PLT 170  --  224  --   --   --  195   < > = values in this interval not displayed.   Thyroid  Recent Labs  Lab 08/23/2022 1242 08/17/22 0312  TSH 4.866*  --   FREET4  --  1.24*    BNP Recent Labs  Lab 08/11/2022 1251  BNP 879.2*    DDimer  Recent Labs  Lab 08/30/2022 1242  DDIMER 0.91*     Radiology    DG CHEST PORT 1 VIEW  Result Date: 08/28/2022 CLINICAL DATA:  Shortness of breath. EXAM: PORTABLE CHEST 1 VIEW COMPARISON:  AP chest 08/18/2022 (multiple studies), 08/17/2022 FINDINGS: Endotracheal tube tip terminates approximately 5 cm above the carina, at the inferior aspect of the clavicular heads, similar to prior. Left subclavian approach central venous catheter tip again overlies the superior vena cava. Enteric tube descends below the diaphragm with the tip and side port overlying the stomach in left upper abdominal quadrant. A second thoracic esophageal catheter.  To have been removed. High-grade right mid and lower lung and left lower lung heterogeneous airspace opacities are unchanged on the right and mildly worsened on the left. Probable small bilateral pleural effusions. No pneumothorax. No acute skeletal abnormality. Cholecystectomy clips. IMPRESSION: 1. High-grade right mid and lower lung and left lower lung heterogeneous airspace opacities are unchanged on the  right and mildly worsened on the left. Differential considerations again include multifocal pneumonia versus asymmetric pulmonary edema. 2. Probable small bilateral pleural effusions. 3. Support apparatus as above. Electronically Signed   By: Neita Garnet M.D.   On: 08/27/2022 12:39    Cardiac Studies   Echo from 08/17/22:   1. Left ventricular ejection fraction, by estimation, is 35%. The left  ventricle has moderately decreased function. The left ventricle  demonstrates global hypokinesis. There is mild concentric left ventricular  hypertrophy. Left ventricular diastolic  parameters are consistent with Grade II diastolic dysfunction  (pseudonormalization). Elevated left atrial pressure.   2. Right ventricular systolic function is normal. The right ventricular  size is normal. There is mildly elevated pulmonary artery systolic  pressure. The estimated right ventricular systolic pressure is 44.2 mmHg.   3. Left atrial size was severely dilated.   4. Right atrial size was mildly dilated.   5. The mitral valve is degenerative. Trivial mitral valve regurgitation.   6. The aortic valve is not well visualized but appears severely calcified  and thickened. There is at least mild to moderate low flow, low gradient  AS with mean gradient , Vmax 2.2, DI 0.3, AVA 1.0cm2 SVi 13.5 (was  previously mild to moderate on  TTE 04/2022). The aortic valve is calcified. There is severe calcifcation  of the aortic valve. There is severe thickening of the aortic valve.  Aortic valve regurgitation is mild.   7. The inferior vena cava is dilated in size with <50% respiratory  variability, suggesting right atrial pressure of 15 mmHg.   Comparison(s): Compared to prior TTE in 04/2022, the LVEF has dropped from  60-65% to 35% with global hypokinesis. Filling pressures are elevated on  current study.    Patient Profile     82 y.o. male with PMH of PAD, right BKA, left transmetatarsal amputation, aortic  stenosis, dyslipidemia, anxiety, depression, peripheral neuropathy,  who presented with DOE, orthopnea, leg edema, and he is currently admitted for NSTEMI, acute systolic and diastolic heart failure.   Assessment & Plan   NSTEMI    Peak  trop >24,000 on 6/10/   Yesterday19,420 Echo this admission showed moderate LV dysfunction New from previous this year. Remains on pressors Will check COOX  Plan for R/L heart cath when improves   Keep on ASA  Heparin added back for afib    HFrEF     LVEF 35% on echo this admit    Initial COOX was 73.5(In SR  yesterday was 60 (when in Afib)   Will repeat   The pt may do better if sinus rhythm restored      ADDENDUM    Rpeat COOX at 11:42 was 59  Will add amiodarone 60 mg /min   No bolus   Goal to get back into NSR   Follow COOX May need milrinone if no signficant change    Pulmonary    Pt remains intubated  Now on 90% FiO2 with 7 PEEP   Continues on ABX  PAF    Back in afib   Rates 100s to 140/     Heparin added     Mild to moderate aortic stenosis -Echo from 6/10 revealed severely calcified aortic valve with mild to moderate low flow, low gradient AS with mean gradient , Vmax 2.2, DI 0.3, AVA 1.0cm2 SVi 13.5  AKI -Creatinine 0.81 on 07/14/2022,  Has been rising since admit  Today 2.42     Heme  Hgb 11.1 today      For questions or updates, please contact [facility] Please consult [email or web address] for contact info under        Signed, Dietrich Pates, MD  08/20/2022, 1:29 PM

## 2022-08-20 NOTE — Progress Notes (Signed)
Consult was placed due to order for vasopressor. Pt has TL CVC in place. Per unit RN pt has sufficient access at this time.

## 2022-08-20 NOTE — Progress Notes (Signed)
Nutrition Follow-up  DOCUMENTATION CODES:  Not applicable  INTERVENTION:  Tube feeds via OG-tube: Vital 1.5 at 20 mL/hr advance by 10 mL every 12 hours, as tolerated to goal rate of 50 mL/hr (1200 mL per day) 60 mL ProSource TF20 - BID Tube feeds at goal provides 1960 kcal, 121 gm of protein, and 917 mL free water daily. Continue to monitor magnesium, potassium, and phosphorus BID for at least 3 days, MD to replete as needed, as pt is at risk for refeeding syndrome. Continue Folic acid, Thiamine, and Multivitamin w/ minerals daily  NUTRITION DIAGNOSIS:  Inadequate oral intake related to inability to eat as evidenced by NPO status. - Ongoing, being addressed via TF  GOAL:  Patient will meet greater than or equal to 90% of their needs - Ongoing, being addressed via TF  MONITOR:  Vent status, Labs, I & O's, TF tolerance  REASON FOR ASSESSMENT:  Ventilator    ASSESSMENT:  82 y.o. male presented to the ED with worsening dyspnea on exertion. PMH includes R BKA, PAD, HLD, HTN, CHF, and anxiety and depression. Pt admitted with NSTEMI, acute CHF, and acute hypoxic respiratory failure.   6/09 - Admitted 6/10 - on BiPAP 6/11 - transferred to ICU 2/2 worsening respiratory status; intubated; NGT - LIWS 6/12 - trickle TF started  Remains on vent. Spoke with RN in room. Reports pt tolerating trickle TF at this time. Reached out to CCM about advancing tube feeds, ok with advancing. Discussed with family in room.   MV: 7 L/min MAP (cuff): 66 Temp (24hrs), Avg:100.2 F (37.9 C), Min:98.4 F (36.9 C), Max:101 F (38.3 C)  Drips Precedex Heparin Levophed Vasopressin  Medications reviewed and include: Vitamin B12, Folic acid, MVI, Colace, NovoLog SSI, Protonix, Miralax, Vitamin B6, Thiamine, IV antibiotics Labs reviewed: Sodium 136, Potassium 3.5, BUN 67, Creatinine 2.42, Phosphorus 4.6, Magnesium 2.2  UOP: 690 mL x 24 hrs  Diet Order:   Diet Order             Diet NPO time  specified  Diet effective now                  EDUCATION NEEDS: Not appropriate for education at this time  Skin:  Skin Assessment: Reviewed RN Assessment  Last BM:  6/9  Height:  Ht Readings from Last 1 Encounters:  08/18/22 5\' 9"  (1.753 m)   Weight:  Wt Readings from Last 1 Encounters:  08/20/22 84.3 kg   Ideal Body Weight:  72.7 kg  BMI:  Body mass index is 27.44 kg/m.  Estimated Nutritional Needs:  Kcal:  1900-2100 Protein:  95-115 grams Fluid:  >/= 1.9 L   Kirby Crigler RD, LDN Clinical Dietitian See [facility] for contact information.

## 2022-08-20 NOTE — Progress Notes (Signed)
ANTICOAGULATION CONSULT NOTE   Pharmacy Consult for heparin  Indication: chest pain/ACS  No Known Allergies  Patient Measurements: Height: 5\' 9"  (175.3 cm) Weight: 84.3 kg (185 lb 13.6 oz) IBW/kg (Calculated) : 70.7 Heparin Dosing Weight: 88.8  Vital Signs: Temp: 100.9 F (38.3 C) (06/13 0738) Temp Source: Axillary (06/13 0738) BP: 105/52 (06/13 0800) Pulse Rate: 112 (06/13 0800)  Labs: Recent Labs    08/18/22 0246 08/18/22 0948 08/18/22 1455 08/18/22 1517 08/19/22 0403 08/19/22 0444 08/19/22 1750 08/20/22 0405 08/20/22 0408  HGB 12.5*   < >  --   --  12.4*   < > 11.3* 10.5* 11.1*  HCT 35.8*   < >  --   --  35.5*   < > 33.2* 31.0* 32.2*  PLT 170  --   --   --  224  --   --   --  195  HEPARINUNFRC 0.54  --   --   --   --   --   --   --   --   CREATININE 1.62*  --  2.29*  --  2.47*  --   --   --  2.42*  TROPONINIHS  --   --   --  19,420*  --   --   --   --   --    < > = values in this interval not displayed.     Estimated Creatinine Clearance: 24.3 mL/min (A) (by C-G formula based on SCr of 2.42 mg/dL (H)).   Medical History: Past Medical History:  Diagnosis Date   Anemia    "just in 2016 when I was bleeding internally" (08/28/2015)   Anxiety    Aortic stenosis    Arthritis    Cervical spondylolysis    Depression    Diabetic ulcer of left foot (HCC) 08/28/2015   pt states he does not have diabetes   Family history of anesthesia complication    "father would start seeing things"   Heart murmur    "related to the aortic stenosis" (08/28/2015)   History of alcohol abuse    History of blood transfusion    "just in 2016 when I was bleeding internally" (08/28/2015)   Hyperlipidemia    high TG   Hypertension    Lesion of ulnar nerve 07/22/2012   Bilateral ulnar neuropathies   Neuropathy    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 EKG showing Afib, not on anticoag PTA. Trop elevation up to >24,000. Pharmacy consulted to dose heparin.   Heparin stopped 6/11 due to coffee ground emesis and this has resolved with CBC stable. Plans are to resume heparin and okay to bolus per CCM  Goal of Therapy:  Heparin level 0.3-0.7 units/ml Monitor platelets by anticoagulation protocol: Yes   Plan:  -Heparin bolus 2000 units x1 then restart heparin at 1100 units/hr -Heparin level in 8 hours and daily wth CBC daily  Harland German, PharmD Clinical Pharmacist **Pharmacist phone directory can now be found on [email or web address] ([email or web address]).  Listed under [facility].

## 2022-08-20 NOTE — Progress Notes (Signed)
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  Pulmonary critical care called to evaluate he will be transferred to intensive care unit with a high probability of intubation in the near future.  Pertinent  Medical History   Past Medical History:  Diagnosis Date   Anemia    "just in 2016 when I was bleeding internally" (08/28/2015)   Anxiety    Aortic stenosis    Arthritis    Cervical spondylolysis    Depression    Diabetic ulcer of left foot (HCC) 08/28/2015   pt states he does not have diabetes   Family history of anesthesia complication    "father would start seeing things"   Heart murmur    "related to the aortic stenosis" (08/28/2015)   History of alcohol abuse    History of blood transfusion    "just in 2016 when I was bleeding internally" (08/28/2015)   Hyperlipidemia    high TG   Hypertension    Lesion of ulnar nerve 07/22/2012   Bilateral ulnar neuropathies   Neuropathy    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  Objective   Blood pressure (!) 105/52, pulse (!) 112, temperature (!) 100.9 F (38.3 C), temperature source Axillary, resp. rate 15, height 5\' 9"  (1.753 m), weight 84.3 kg, SpO2 99 %. CVP:  [5 mmHg-15 mmHg] 14 mmHg  Vent Mode: PRVC FiO2 (%):  [70 %-100 %] 100 % Set Rate:  [10 bmp] 10 bmp Vt Set:  [560 mL] 560 mL PEEP:  [8 cmH20] 8 cmH20 Plateau Pressure:  [19 cmH20-21 cmH20] 20 cmH20   Intake/Output Summary (Last 24 hours) at 08/20/2022 0846 Last data filed at 08/20/2022 0800 Gross per 24 hour  Intake 1565.76 ml  Output 750 ml  Net 815.76 ml   Filed Weights   08/18/2022 1949 08/18/22 2302 08/20/22 0446  Weight: 89.7 kg 83.7 kg 84.3 kg    Examination: General: Chronically ill-appearing elderly gentleman intubated on mechanical life support critically ill. HENT: NCAT, tracking appropriately Lungs: Bilateral mechanically ventilated breath sounds Cardiovascular: Regular rate rhythm, S1-S2 Abdomen: Soft, nontender nondistended Extremities: Right BKA, left forefoot amputation Neuro: Sedated mechanical support, RASS -2 GU: Deferred  Resolved Hospital Problem list     Assessment & Plan:   Acute hypoxic respiratory failure  Acute on chronic systolic heart failure Pulmonary edema Right-sided lower lobe aspiration pneumonia Plan: Continue full mechanical vent support Wean PEEP and FiO2 to maintain sats greater than 90 Adult mechanical vent protocol, chest x-ray as needed VAP  prophylaxis Reduce his respiratory rate patient has improved his pH. Remains on inhaled epoprostenol His PaO2 was 75.  Working on continue to wean oxygen needs. Continue Zosyn, MRSA PCR swab was negative PAD guidelines sedation Arterial line placed to pulm arterial blood gases. Follow Co. 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    Best Practice (right click and "Reselect all SmartList Selections" daily)   Diet/type: NPO DVT prophylaxis: systemic heparin GI prophylaxis: PPI Lines: N/A Foley:  N/A Code Status:  full code Last date of multidisciplinary goals of care discussion [tbd]  Requested intubation Labs   CBC: Recent Labs  Lab 08/12/2022 1242 08/17/22 0312 08/18/22 0246 08/18/22 0948 08/13/2022 0403 08/27/2022 0444 09/03/2022 0741 09/03/2022 1719 08/24/2022 1750 08/20/22 0405 08/20/22 0408  WBC 6.7 7.0 7.2  --  11.2*  --   --   --   --   --  9.0  HGB 13.1 13.0 12.5*   < > 12.4*   < > 11.2* 11.2* 11.3* 10.5* 11.1*  HCT 38.2* 37.2* 35.8*   < > 35.5*   < > 33.0* 33.0* 33.2* 31.0* 32.2*  MCV 91.2 87.5 89.5  --  84.3  --   --   --   --   --  87.0  PLT 169 190 170  --  224  --   --   --   --   --  195   < > = values in this interval not displayed.    Basic Metabolic Panel: Recent Labs  Lab 08/25/2022 1242 08/17/22 0312 08/17/22 1437 08/18/22 0246 08/18/22 0948 08/18/22 1455 09/04/2022 0403 09/01/2022 0444 08/27/2022 0532 08/09/2022 0741 08/29/2022 1604 08/11/2022 1719 08/20/22 0405 08/20/22 0408  NA 128*   < > 129* 132*   < > 137 137   < > 136 135  --  136 136 136  K 3.9   < > 4.3 3.9   < > 3.6 3.1*   < > 3.2* 3.7  --  3.9 3.6 3.5  CL 95*   < > 93* 96*  --  91* 95*  --   --   --   --   --   --  97*  CO2 17*   < > 17* 19*  --  22 25  --   --   --   --   --   --  24  GLUCOSE 97   < > 95 90  --  204* 150*  --   --   --   --   --   --  160*  BUN 15   < > 27* 34*  --  47* 49*  --   --   --   --   --   --  67*  CREATININE 1.39*   < > 1.62* 1.62*  --  2.29* 2.47*  --   --   --   --   --   --   2.42*  CALCIUM 8.9   < > 9.1 8.9  --  9.0 8.5*  --   --   --   --   --   --  8.1*  MG 1.7  --   --   --   --   --   --   --   --   --  2.1  --   --  2.2  PHOS  --   --   --   --   --   --   --   --   --   --  4.8*  --   --  4.6   < > = values in this interval not displayed.   GFR: Estimated Creatinine Clearance: 24.3 mL/min (A) (by C-G formula based on SCr of 2.42 mg/dL (H)). Recent Labs  Lab 08/17/22 0312 08/17/22 0435 08/18/22 0246 08/18/22 0823 08/18/22 1455 08/27/22 0403 08/20/22 0408  PROCALCITON  --  <0.10  --   --   --   --   --   WBC 7.0  --  7.2  --   --  11.2* 9.0  LATICACIDVEN  --   --   --  2.2* 2.8*  --  1.6    Liver Function Tests: Recent Labs  Lab 08/17/22 0312  AST 243*  ALT 46*  ALKPHOS 30*  BILITOT 1.6*  PROT 5.7*  ALBUMIN 3.2*   No results for input(s): "LIPASE", "AMYLASE" in the last 168 hours. No results for input(s): "AMMONIA" in the last 168 hours.  ABG    Component Value Date/Time   PHART 7.489 (H) 08/20/2022 0405   PCO2ART 35.7 08/20/2022 0405   PO2ART 75 (L) 08/20/2022 0405   HCO3 27.0 08/20/2022 0405   TCO2 28 08/20/2022 0405   ACIDBASEDEF 9.0 (H) 08/18/2022 1301   O2SAT 95 08/20/2022 0405     Coagulation Profile: No results for input(s): "INR", "PROTIME" in the last 168 hours.  Cardiac Enzymes: No results for input(s): "CKTOTAL", "CKMB", "CKMBINDEX", "TROPONINI" in the last 168 hours.  HbA1C: Hgb A1c MFr Bld  Date/Time Value Ref Range Status  07/14/2022 04:40 PM 5.5 4.6 - 6.5 % Final    Comment:    Glycemic Control Guidelines for People with Diabetes:Non Diabetic:  <6%Goal of Therapy: <7%Additional Action Suggested:  >8%   10/14/2021 02:47 PM 5.6 4.6 - 6.5 % Final    Comment:    Glycemic Control Guidelines for People with Diabetes:Non Diabetic:  <6%Goal of Therapy: <7%Additional Action Suggested:  >8%     CBG: Recent Labs  Lab 08/18/22 1304 2022/08/27 1729 08/20/22 0403 08/20/22 0733  GLUCAP 158* 104* 168* 142*     Review of Systems:   10 point review of system taken, please see HPI for positives and negatives.   Past Medical History:  He,  has a past medical history of Anemia, Anxiety, Aortic stenosis, Arthritis, Cervical spondylolysis, Depression, Diabetic ulcer of left foot (HCC) (08/28/2015), Family history of anesthesia complication, Heart murmur, History of alcohol abuse, History of blood transfusion, Hyperlipidemia, Hypertension, Lesion of ulnar nerve (07/22/2012), Neuropathy, Osteomyelitis of ankle and foot (HCC) (10/2013), PVD (peripheral vascular disease) (HCC), and Ulcer of esophagus (2016).   Surgical History:   Past Surgical History:  Procedure Laterality Date   AMPUTATION Left 11/20/13 and 11/24/13   middle   AMPUTATION Left 01/03/2014   Procedure: Left Transmetatarsal Amputation;  Surgeon: Nadara Mustard, MD;  Location: [facility] OR;  Service: Orthopedics;  Laterality: Left;   AMPUTATION Right 07/27/2014   Procedure: Right 4th Ray Amputation;  Surgeon: Aldean Baker  V, MD;  Location: [facility] OR;  Service: Orthopedics;  Laterality: Right;   AMPUTATION Right 09/12/2014   Procedure: RIGHT TRANSMETATARSAL AMPUTATION;  Surgeon: Nadara Mustard, MD;  Location: [facility] OR;  Service: Orthopedics;  Laterality: Right;   AMPUTATION Right 09/28/2014   Procedure: Right Below Knee Amputation;  Surgeon: Nadara Mustard, MD;  Location[facility] OR;  Service: Orthopedics;  Laterality: Right;   CATARACT EXTRACTION W/ INTRAOCULAR LENS  IMPLANT, BILATERAL Bilateral 05/2015   ESOPHAGOGASTRODUODENOSCOPY N/A 08/16/2014   Procedure: ESOPHAGOGASTRODUODENOSCOPY (EGD);  Surgeon: Vida Rigger, MD;  [facility];  Service: Endoscopy;  Laterality: N/A;   ESOPHAGOGASTRODUODENOSCOPY (EGD) WITH PROPOFOL N/A 08/17/2014   Procedure: ESOPHAGOGASTRODUODENOSCOPY (EGD) WITH PROPOFOL;  Surgeon: Graylin Shiver, MD;  Location: [facility] ENDOSCOPY;  Service: Endoscopy;  Laterality: N/A;   LAPAROSCOPIC CHOLECYSTECTOMY     TONSILLECTOMY     TRANSMETATARSAL  AMPUTATION Left 10/2013     Social History:   reports that he has never smoked. He has never used smokeless tobacco. He reports current alcohol use. He reports that he does not use drugs.   Family History:  His family history includes Colon polyps in his father; Hyperlipidemia in his mother; Lung cancer in his father. There is no history of Diabetes, Coronary artery disease, Prostate cancer, or Colon cancer.   Allergies No Known Allergies   Home Medications  Prior to Admission medications   Medication Sig Start Date End Date Taking? Authorizing Provider  ALPRAZolam (XANAX) 0.5 MG tablet TAKE 1 TABLET(0.5 MG) BY MOUTH THREE TIMES DAILY AS NEEDED FOR ANXIETY Patient taking differently: Take 0.5 mg by mouth 3 (three) times daily as needed for anxiety. 07/07/22  Yes Paz, Nolon Rod, MD  amLODipine (NORVASC) 5 MG tablet Take 1 tablet (5 mg total) daily by mouth. 01/15/17  Yes Paz, Nolon Rod, MD  Ascorbic Acid (VITAMIN C) 1000 MG tablet Take 3,000 mg by mouth daily.   Yes [provider]  atorvastatin (LIPITOR) 80 MG tablet Take 1 tablet (80 mg total) by mouth at bedtime. 07/17/22  Yes Paz, Nolon Rod, MD  citalopram (CELEXA) 40 MG tablet Take 1 tablet (40 mg total) by mouth daily. 07/27/22  Yes Paz, Nolon Rod, MD  clopidogrel (PLAVIX) 75 MG tablet Take 75 mg by mouth daily.   Yes [provider]  cycloSPORINE (RESTASIS) 0.05 % ophthalmic emulsion Place 2 drops into both eyes daily as needed (eyes).   Yes [provider]  fenofibrate 160 MG tablet Take 1 tablet (160 mg total) by mouth daily. 03/04/22  Yes Paz, Nolon Rod, MD  losartan (COZAAR) 100 MG tablet Take 1 tablet (100 mg total) by mouth daily. 02/15/19  Yes Paz, Nolon Rod, MD  Multiple Vitamin (MULTIVITAMIN WITH MINERALS) TABS tablet Take 1 tablet by mouth daily.   Yes [provider]  pregabalin (LYRICA) 150 MG capsule Take 150 mg by mouth in the morning, at noon, and at bedtime.   Yes [provider]  Propylene Glycol  (SYSTANE BALANCE OP) Apply 1 drop to eye 2 (two) times daily as needed (dry eyes). Reported on 08/27/2015   Yes [provider]  Psyllium (METAMUCIL PO) Take 5 capsules by mouth daily.    Yes [provider]  pyridOXINE (VITAMIN B-6) 100 MG tablet Take 100 mg by mouth daily.   Yes [provider]  RA ASPIRIN EC 81 MG EC tablet Take 81 mg by mouth daily. 12/06/14  Yes [provider]  thiamine 50 MG tablet Take 50 mg  by mouth daily.   Yes [provider]  traZODone (DESYREL) 100 MG tablet Take 100 mg by mouth at bedtime.   Yes [provider]  vitamin B-12 (CYANOCOBALAMIN) 1000 MCG tablet Take 2,000 mcg by mouth daily.    Yes [provider]  mupirocin ointment (BACTROBAN) 2 % Apply 1 application topically 2 (two) times daily. Patient not taking: Reported on 08-22-22 09/06/20   [name], MD   This patient is critically ill with multiple organ system failure; which, requires frequent high complexity decision making, assessment, support, evaluation, and titration of therapies. This was completed through the application of advanced monitoring technologies and extensive interpretation of multiple databases. During this encounter critical care time was devoted to patient care services described in this note for 32 minutes.  [name], DO [place] [facility] 08/20/2022 8:46 AM

## 2022-08-21 ENCOUNTER — Inpatient Hospital Stay (HOSPITAL_COMMUNITY): Payer: Medicare Other

## 2022-08-21 DIAGNOSIS — I214 Non-ST elevation (NSTEMI) myocardial infarction: Secondary | ICD-10-CM | POA: Diagnosis not present

## 2022-08-21 LAB — CBC
HCT: 29.4 % — ABNORMAL LOW (ref 39.0–52.0)
Hemoglobin: 9.7 g/dL — ABNORMAL LOW (ref 13.0–17.0)
MCH: 29.8 pg (ref 26.0–34.0)
MCHC: 33 g/dL (ref 30.0–36.0)
MCV: 90.2 fL (ref 80.0–100.0)
Platelets: 206 10*3/uL (ref 150–400)
RBC: 3.26 MIL/uL — ABNORMAL LOW (ref 4.22–5.81)
RDW: 15 % (ref 11.5–15.5)
WBC: 10 10*3/uL (ref 4.0–10.5)
nRBC: 0.3 % — ABNORMAL HIGH (ref 0.0–0.2)

## 2022-08-21 LAB — POCT I-STAT 7, (LYTES, BLD GAS, ICA,H+H)
Acid-base deficit: 1 mmol/L (ref 0.0–2.0)
Acid-base deficit: 3 mmol/L — ABNORMAL HIGH (ref 0.0–2.0)
Bicarbonate: 21.1 mmol/L (ref 20.0–28.0)
Bicarbonate: 22.8 mmol/L (ref 20.0–28.0)
Calcium, Ion: 1.04 mmol/L — ABNORMAL LOW (ref 1.15–1.40)
Calcium, Ion: 1.06 mmol/L — ABNORMAL LOW (ref 1.15–1.40)
HCT: 27 % — ABNORMAL LOW (ref 39.0–52.0)
HCT: 28 % — ABNORMAL LOW (ref 39.0–52.0)
Hemoglobin: 9.2 g/dL — ABNORMAL LOW (ref 13.0–17.0)
Hemoglobin: 9.5 g/dL — ABNORMAL LOW (ref 13.0–17.0)
O2 Saturation: 84 %
O2 Saturation: 92 %
Patient temperature: 37
Patient temperature: 97
Potassium: 3.4 mmol/L — ABNORMAL LOW (ref 3.5–5.1)
Potassium: 3.6 mmol/L (ref 3.5–5.1)
Sodium: 136 mmol/L (ref 135–145)
Sodium: 136 mmol/L (ref 135–145)
TCO2: 22 mmol/L (ref 22–32)
TCO2: 24 mmol/L (ref 22–32)
pCO2 arterial: 32.6 mmHg (ref 32–48)
pCO2 arterial: 33.3 mmHg (ref 32–48)
pH, Arterial: 7.41 (ref 7.35–7.45)
pH, Arterial: 7.449 (ref 7.35–7.45)
pO2, Arterial: 44 mmHg — ABNORMAL LOW (ref 83–108)
pO2, Arterial: 61 mmHg — ABNORMAL LOW (ref 83–108)

## 2022-08-21 LAB — CULTURE, RESPIRATORY W GRAM STAIN: Gram Stain: NONE SEEN

## 2022-08-21 LAB — CBC WITH DIFFERENTIAL/PLATELET
Abs Immature Granulocytes: 0.28 10*3/uL — ABNORMAL HIGH (ref 0.00–0.07)
Basophils Absolute: 0 10*3/uL (ref 0.0–0.1)
Basophils Relative: 0 %
Eosinophils Absolute: 0 10*3/uL (ref 0.0–0.5)
Eosinophils Relative: 0 %
HCT: 29.4 % — ABNORMAL LOW (ref 39.0–52.0)
Hemoglobin: 9.9 g/dL — ABNORMAL LOW (ref 13.0–17.0)
Immature Granulocytes: 3 %
Lymphocytes Relative: 13 %
Lymphs Abs: 1.1 10*3/uL (ref 0.7–4.0)
MCH: 30 pg (ref 26.0–34.0)
MCHC: 33.7 g/dL (ref 30.0–36.0)
MCV: 89.1 fL (ref 80.0–100.0)
Monocytes Absolute: 0.7 10*3/uL (ref 0.1–1.0)
Monocytes Relative: 8 %
Neutro Abs: 6.4 10*3/uL (ref 1.7–7.7)
Neutrophils Relative %: 76 %
Platelets: 200 10*3/uL (ref 150–400)
RBC: 3.3 MIL/uL — ABNORMAL LOW (ref 4.22–5.81)
RDW: 14.9 % (ref 11.5–15.5)
WBC: 8.5 10*3/uL (ref 4.0–10.5)
nRBC: 0 % (ref 0.0–0.2)

## 2022-08-21 LAB — BASIC METABOLIC PANEL
Anion gap: 16 — ABNORMAL HIGH (ref 5–15)
BUN: 77 mg/dL — ABNORMAL HIGH (ref 8–23)
CO2: 22 mmol/L (ref 22–32)
Calcium: 7.7 mg/dL — ABNORMAL LOW (ref 8.9–10.3)
Chloride: 99 mmol/L (ref 98–111)
Creatinine, Ser: 3.17 mg/dL — ABNORMAL HIGH (ref 0.61–1.24)
GFR, Estimated: 19 mL/min — ABNORMAL LOW (ref >60)
Glucose, Bld: 175 mg/dL — ABNORMAL HIGH (ref 70–99)
Potassium: 3.4 mmol/L — ABNORMAL LOW (ref 3.5–5.1)
Sodium: 137 mmol/L (ref 135–145)

## 2022-08-21 LAB — COMPREHENSIVE METABOLIC PANEL
ALT: 992 U/L — ABNORMAL HIGH (ref 0–44)
AST: 1540 U/L — ABNORMAL HIGH (ref 15–41)
Albumin: 1.9 g/dL — ABNORMAL LOW (ref 3.5–5.0)
Alkaline Phosphatase: 31 U/L — ABNORMAL LOW (ref 38–126)
Anion gap: 14 (ref 5–15)
BUN: 74 mg/dL — ABNORMAL HIGH (ref 8–23)
CO2: 21 mmol/L — ABNORMAL LOW (ref 22–32)
Calcium: 7.7 mg/dL — ABNORMAL LOW (ref 8.9–10.3)
Chloride: 99 mmol/L (ref 98–111)
Creatinine, Ser: 2.86 mg/dL — ABNORMAL HIGH (ref 0.61–1.24)
GFR, Estimated: 22 mL/min — ABNORMAL LOW (ref >60)
Glucose, Bld: 217 mg/dL — ABNORMAL HIGH (ref 70–99)
Potassium: 3.6 mmol/L (ref 3.5–5.1)
Sodium: 134 mmol/L — ABNORMAL LOW (ref 135–145)
Total Bilirubin: 1.5 mg/dL — ABNORMAL HIGH (ref 0.3–1.2)
Total Protein: 5 g/dL — ABNORMAL LOW (ref 6.5–8.1)

## 2022-08-21 LAB — PROCALCITONIN: Procalcitonin: 4.87 ng/mL

## 2022-08-21 LAB — COOXEMETRY PANEL
Carboxyhemoglobin: 0.7 % (ref 0.5–1.5)
Methemoglobin: <0.7 % (ref 0.0–1.5)
O2 Saturation: 35.7 %
Total hemoglobin: 10.2 g/dL — ABNORMAL LOW (ref 12.0–16.0)

## 2022-08-21 LAB — MAGNESIUM: Magnesium: 2.3 mg/dL (ref 1.7–2.4)

## 2022-08-21 LAB — GLUCOSE, CAPILLARY: Glucose-Capillary: 154 mg/dL — ABNORMAL HIGH (ref 70–99)

## 2022-08-21 LAB — CULTURE, BLOOD (ROUTINE X 2): Special Requests: ADEQUATE

## 2022-08-21 LAB — PHOSPHORUS: Phosphorus: 4.7 mg/dL — ABNORMAL HIGH (ref 2.5–4.6)

## 2022-08-21 LAB — HEPARIN LEVEL (UNFRACTIONATED): Heparin Unfractionated: 0.58 IU/mL (ref 0.30–0.70)

## 2022-08-21 MED ORDER — EPINEPHRINE HCL 5 MG/250ML IV SOLN IN NS
0.0000 ug/min | INTRAVENOUS | Status: DC
  Administered 2022-08-21: 10 ug/min via INTRAVENOUS
  Filled 2022-08-21: qty 250

## 2022-08-21 MED ORDER — MIDAZOLAM HCL 2 MG/2ML IJ SOLN
INTRAMUSCULAR | Status: AC
  Filled 2022-08-21: qty 4

## 2022-08-21 MED ORDER — GLYCOPYRROLATE 0.2 MG/ML IJ SOLN: 0.2000 mg | INTRAMUSCULAR | Status: DC | PRN

## 2022-08-21 MED ORDER — GLYCOPYRROLATE 1 MG PO TABS: 1.0000 mg | ORAL_TABLET | ORAL | Status: DC | PRN

## 2022-08-21 MED ORDER — SODIUM BICARBONATE 8.4 % IV SOLN
INTRAVENOUS | Status: AC | PRN
  Administered 2022-08-21: 50 meq via INTRAVENOUS

## 2022-08-21 MED ORDER — ACETAMINOPHEN 650 MG RE SUPP: 650.0000 mg | Freq: Four times a day (QID) | RECTAL | Status: DC | PRN

## 2022-08-21 MED ORDER — EPINEPHRINE 0.1 MG/10ML (10 MCG/ML) SYRINGE FOR IV PUSH (FOR BLOOD PRESSURE SUPPORT)
PREFILLED_SYRINGE | INTRAVENOUS | Status: AC | PRN
  Administered 2022-08-21: 10 ug via INTRAVENOUS

## 2022-08-21 MED ORDER — SODIUM CHLORIDE 0.9 % IV SOLN: INTRAVENOUS | Status: DC

## 2022-08-21 MED ORDER — FENTANYL CITRATE PF 50 MCG/ML IJ SOSY
PREFILLED_SYRINGE | INTRAMUSCULAR | Status: AC
  Administered 2022-08-21: 50 ug
  Filled 2022-08-21: qty 4

## 2022-08-21 MED ORDER — ACETAMINOPHEN 325 MG PO TABS: 650.0000 mg | ORAL_TABLET | Freq: Four times a day (QID) | ORAL | Status: DC | PRN

## 2022-08-21 MED ORDER — POLYVINYL ALCOHOL 1.4 % OP SOLN: 1.0000 [drp] | Freq: Four times a day (QID) | OPHTHALMIC | Status: DC | PRN

## 2022-08-21 MED ORDER — FENTANYL CITRATE PF 50 MCG/ML IJ SOSY
25.0000 ug | PREFILLED_SYRINGE | INTRAMUSCULAR | Status: DC | PRN
  Administered 2022-08-21: 100 ug via INTRAVENOUS

## 2022-08-21 MED ORDER — MIDAZOLAM HCL 2 MG/2ML IJ SOLN: 2.0000 mg | INTRAMUSCULAR | Status: DC | PRN

## 2022-08-22 LAB — CULTURE, BLOOD (ROUTINE X 2): Culture: NO GROWTH

## 2022-08-23 LAB — GLUCOSE, CAPILLARY
Glucose-Capillary: 143 mg/dL — ABNORMAL HIGH (ref 70–99)
Glucose-Capillary: 200 mg/dL — ABNORMAL HIGH (ref 70–99)

## 2022-08-28 ENCOUNTER — Ambulatory Visit: Payer: Medicare Other

## 2022-08-28 MED FILL — Medication: Qty: 1 | Status: AC

## 2022-09-02 ENCOUNTER — Ambulatory Visit (HOSPITAL_COMMUNITY): Admit: 2022-09-02 | Payer: Medicare Other | Admitting: Cardiovascular Disease

## 2022-09-02 ENCOUNTER — Encounter (HOSPITAL_COMMUNITY): Payer: Self-pay

## 2022-09-02 SURGERY — RIGHT/LEFT HEART CATH AND CORONARY ANGIOGRAPHY
Anesthesia: LOCAL

## 2022-09-07 NOTE — Progress Notes (Signed)
At about 0740 pt vomited copious amounts and had large BM pt became unresponsive and pulse was thready. Dr Tonia Brooms at bedside. See code blue documentation.

## 2022-09-07 NOTE — Progress Notes (Addendum)
[facility] notified of ABG results, asked about KCL replacement for K 3.4.

## 2022-09-07 NOTE — Progress Notes (Signed)
I was called overnight by the nurse that patient remains hypotensive-MAP in 50s despite being maxed out on three pressors. I went and assessed the patient- he was hypoxic to 80s with MAPs in 50s. I spoke to family- this was my first time meeting the patient and family.   A 4th pressor Epinephrine was started due to low MAPs.  His HR have been in low 100s only; so unlikely Cversion will help significantly at this stage.  CXR was repeated which showed worsening ARDS/lung infiltrates most likely Case was discussed with CCM attending to see if any adjustments can be done to ventilator- not a candidate for proning or paralytics due to low BP and not being on sedation to improve oxygenation.  I did bedside POCUS which showed EF 20/30% and mild MR. Seemed similar to his Echo 2-3 days ago. Case was discussed with cardiology attending Dr Wyline Mood as well- who agreed that currently we should offer maximal support for his rapidly progressive shock. CMP showed shock liver. Will check for Co-OXY and pro-calcitonin. Due to his age, severe PAD, unfortunately not a candidate for anything else, per Dr. Wyline Mood.   Had an extensive family discussion regarding GOC. Informed the family about the severity of his illness. The family wants him to be full code for now- they will continue to discuss.

## 2022-09-07 NOTE — Procedures (Signed)
Extubation Procedure Note  Patient Details:   Name: Gene [name] DOB: 01/12/1941 MRN: [id number]   Airway Documentation:    Vent end date: (not recorded) Vent end time: (not recorded)   Evaluation  O2 sats: currently acceptable Complications: Complications of   Patient did not tolerate procedure well. Bilateral Breath Sounds: Rhonchi   No  [name] 08/08/2022, 8:02 AM  Pt compassionately extubated according to family wishes.  EPO turned off prior to extubation.

## 2022-09-07 NOTE — Progress Notes (Signed)
PCCM:  Called to the bedside. Systolic Bps 50s. Patient vomited. Brief Cardiac arrest CPR was started.  ROSC was obtained.  I met with patients sons at bedside.  I explained his current situation. They are in agreement with no escalation of care.   Decision was made for comfort care transition as he is maxed on 4 vasopressors at this time.  PRN pain meds and versed given  Family at bedside holding patients hands   [name], DO [place] [facility] 08/11/2022 7:52 AM

## 2022-09-07 NOTE — Death Summary Note (Signed)
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 Pulmonary critical care called to evaluate he will be transferred to intensive care unit with a high probability of intubation in the near future.   Diagnoses  Preliminary cause of death:  Secondary Diagnoses (including complications and co-morbidities):  Principal Problem:   NSTEMI (non-ST elevated myocardial infarction) (HCC) Active Problems:   Anxiety and depression   Peripheral neuropathy (HCC)   PVD (peripheral vascular disease) (HCC)   S/P transmetatarsal amputation of foot (HCC)   Dyslipidemia   Mild aortic stenosis   Bilateral carotid artery stenosis   Hx of BKA (HCC)   Acute CHF (congestive heart failure) (HCC)   Acute on chronic diastolic congestive heart failure (HCC)   Acute respiratory failure with hypoxia and hypercapnia [facility])   Brief Hospital Course (including significant findings, care, treatment, and services provided and events leading to death)  82 year old male has a past medical history is very extensive and is well-documented below most notable for 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 Pulmonary critical care called to evaluate he will be transferred to intensive care  unit with a high probability of intubation in the near future.   08/18/2022 intubated 08/25/2022 updated family, still on pressors, high vent settings, inh EPO 6/*14/2024 escalating pressor requirements.  Continued decompensation,  Acute hypoxic respiratory failure  Acute on chronic systolic heart failure Pulmonary edema Right-sided lower lobe aspiration pneumonia History of vascular insufficiency with left BKA and left metatarsal amputation Maintain adequate blood flow to all extremities   Diabetes mellitus AKI   Hyponatremia - improved    NSTEMI  PAF   Bloody gastric secretions  Hospital course: Patient had escalating vasopressor requirements.  On the morning of 09/14/22 suffered a brief cardiac arrest.  ROSC was obtained within 3 minutes.  Met with patient's family at bedside.  Please see separate documentation regarding comfort care transition.  Being maxed on multiple vasopressors the decision was made for DNR status and transition to comfort care.  Patient was palliatively extubated with family at bedside.   Pertinent Labs and Studies  Significant Diagnostic Studies DG CHEST PORT 1 VIEW  Result Date: 09/14/2022 CLINICAL DATA:  Shortness of breath and chest pain EXAM: PORTABLE CHEST 1 VIEW COMPARISON:  08/22/2022 FINDINGS: Cardiac shadow is stable. Endotracheal tube, gastric catheter and left subclavian central line are seen and stable. Patchy airspace opacities are noted bilaterally but increased when compared with the prior exam. Enlarging right-sided pleural effusion is noted. IMPRESSION: Increasing airspace opacities bilaterally. Differential again includes multifocal pneumonia and edema. Enlarging right-sided effusion is noted. Electronically Signed   By: Alcide Clever M.D.   On: 09/14/2022 00:33   DG CHEST PORT 1 VIEW  Result Date: 09/05/2022 CLINICAL DATA:  Shortness of breath. EXAM: PORTABLE CHEST  1 VIEW COMPARISON:  AP chest 08/18/2022 (multiple studies), 08/17/2022  FINDINGS: Endotracheal tube tip terminates approximately 5 cm above the carina, at the inferior aspect of the clavicular heads, similar to prior. Left subclavian approach central venous catheter tip again overlies the superior vena cava. Enteric tube descends below the diaphragm with the tip and side port overlying the stomach in left upper abdominal quadrant. A second thoracic esophageal catheter.  To have been removed. High-grade right mid and lower lung and left lower lung heterogeneous airspace opacities are unchanged on the right and mildly worsened on the left. Probable small bilateral pleural effusions. No pneumothorax. No acute skeletal abnormality. Cholecystectomy clips. IMPRESSION: 1. High-grade right mid and lower lung and left lower lung heterogeneous airspace opacities are unchanged on the right and mildly worsened on the left. Differential considerations again include multifocal pneumonia versus asymmetric pulmonary edema. 2. Probable small bilateral pleural effusions. 3. Support apparatus as above. Electronically Signed   By: Neita Garnet M.D.   On: 08/24/2022 12:39   DG CHEST PORT 1 VIEW  Result Date: 08/18/2022 CLINICAL DATA:  Central line placement EXAM: PORTABLE CHEST 1 VIEW COMPARISON:  Previous studies including the examination done earlier today FINDINGS: There is interval placement of left subclavian central venous catheter with its tip in superior vena cava. Tip of endotracheal tube is 7 cm above the carina. NG tube is noted traversing the esophagus. There are 2 catheters in the course of thoracic esophagus. Large alveolar infiltrates are seen in right mid and both lower lung fields. Right lateral CP angle is indistinct. Left lateral CP angle appears clear. There is no pneumothorax. IMPRESSION: There is interval placement of left subclavian central venous catheter. There is no pneumothorax. Large alveolar infiltrates are seen in right parahilar region and both lower lung fields, more so  on the right side suggesting multifocal pneumonia or asymmetric pulmonary edema. Electronically Signed   By: Ernie Avena M.D.   On: 08/18/2022 16:22   Portable Chest x-ray  Result Date: 08/18/2022 CLINICAL DATA:  Difficulty breathing, endotracheal tube placement EXAM: PORTABLE CHEST 1 VIEW COMPARISON:  Previous studies including the examination done on 08/17/2022 FINDINGS: Tip of endotracheal tube is 6.5 cm above the carina. Tip of NG tube is seen in the stomach. Transverse diameter of heart is increased. Alveolar densities are seen in right parahilar region and both lower lung fields with interval worsening. Small to moderate right pleural effusion is seen. There is no pneumothorax. IMPRESSION: Alveolar densities are seen in right parahilar region and both lower lung fields with interval worsening suggesting worsening of asymmetric pulmonary edema and possibly worsening of multifocal pneumonia. There is small to moderate right pleural effusion. Electronically Signed   By: Ernie Avena M.D.   On: 08/18/2022 13:27   [place] EKG SITE RITE  Result Date: 08/18/2022 If Site Rite image not attached, placement could not be confirmed due to current cardiac rhythm.  ECHOCARDIOGRAM COMPLETE  Result Date: 08/17/2022    ECHOCARDIOGRAM REPORT   Patient Name:   BALEN PINAULT III Date of Exam: 08/17/2022 Medical Rec #:  [id number]            Height:       69.0 in Accession #:    [id number]           Weight:       197.8 lb Date of Birth:  1940/08/31            BSA:  2.056 m Patient Age:    81 years             BP:           96/54 mmHg Patient Gender: M                    HR:           74 bpm. Exam Location:  Inpatient Procedure: 2D Echo, Cardiac Doppler, Color Doppler and Intracardiac            Opacification Agent Indications:    CHF - Acute Systolic  History:        Patient has prior history of Echocardiogram examinations, most                 recent 04/13/2022. CHF, Previous Myocardial Infarction,  PAD and                 Carotid Disease, Signs/Symptoms:Dyspnea, Edema and Chest Pain;                 Risk Factors:Dyslipidemia and Hypertension.  Sonographer:    Wallie Char Referring Phys: [phone] PREETHA JOSEPH  Sonographer Comments: Technically difficult study due to poor echo windows. IMPRESSIONS  1. Left ventricular ejection fraction, by estimation, is 35%. The left ventricle has moderately decreased function. The left ventricle demonstrates global hypokinesis. There is mild concentric left ventricular hypertrophy. Left ventricular diastolic parameters are consistent with Grade II diastolic dysfunction (pseudonormalization). Elevated left atrial pressure.  2. Right ventricular systolic function is normal. The right ventricular size is normal. There is mildly elevated pulmonary artery systolic pressure. The estimated right ventricular systolic pressure is 44.2 mmHg.  3. Left atrial size was severely dilated.  4. Right atrial size was mildly dilated.  5. The mitral valve is degenerative. Trivial mitral valve regurgitation.  6. The aortic valve is not well visualized but appears severely calcified and thickened. There is at least mild to moderate low flow, low gradient AS with mean gradient , Vmax 2.2, DI 0.3, AVA 1.0cm2 SVi 13.5 (was previously mild to moderate on TTE 04/2022). The aortic valve is calcified. There is severe calcifcation of the aortic valve. There is severe thickening of the aortic valve. Aortic valve regurgitation is mild.  7. The inferior vena cava is dilated in size with <50% respiratory variability, suggesting right atrial pressure of 15 mmHg. Comparison(s): Compared to prior TTE in 04/2022, the LVEF has dropped from 60-65% to 35% with global hypokinesis. Filling pressures are elevated on current study. FINDINGS  Left Ventricle: Left ventricular ejection fraction, by estimation, is 35%. The left ventricle has moderately decreased function. The left ventricle demonstrates global  hypokinesis. Definity contrast agent was given IV to delineate the left ventricular endocardial borders. The left ventricular internal cavity size was normal in size. There is mild concentric left ventricular hypertrophy. Left ventricular diastolic parameters are consistent with Grade II diastolic dysfunction (pseudonormalization). Elevated left atrial pressure. Right Ventricle: The right ventricular size is normal. No increase in right ventricular wall thickness. Right ventricular systolic function is normal. There is mildly elevated pulmonary artery systolic pressure. The tricuspid regurgitant velocity is 2.70  m/s, and with an assumed right atrial pressure of 15 mmHg, the estimated right ventricular systolic pressure is 44.2 mmHg. Left Atrium: Left atrial size was severely dilated. Right Atrium: Right atrial size was mildly dilated. Pericardium: There is no evidence of pericardial effusion. Mitral Valve: The mitral valve is degenerative in appearance. There is mild thickening  of the mitral valve leaflet(s). Trivial mitral valve regurgitation. MV peak gradient, 5.1 mmHg. The mean mitral valve gradient is 1.0 mmHg. Tricuspid Valve: The tricuspid valve is normal in structure. Tricuspid valve regurgitation is trivial. Aortic Valve: The aortic valve is not well visualized but appears severely calcified and thickened. There is at least mild to moderate low flow, low gradient AS with mean gradient , Vmax 2.2, DI 0.3, AVA 1.0cm2 SVi 13.5 (was previously mild to moderate on TTE 04/2022). The aortic valve is calcified. There is severe calcifcation of the aortic valve. There is severe thickening of the aortic valve. Aortic valve regurgitation is mild. Aortic regurgitation PHT measures 368 msec. Aortic valve mean gradient measures 10.8 mmHg. Aortic valve peak gradient measures 17.8 mmHg. Aortic valve area, by VTI measures 0.56 cm. Pulmonic Valve: The pulmonic valve was not well visualized. Pulmonic valve regurgitation  is trivial. Aorta: The aortic root is normal in size and structure. Venous: The inferior vena cava is dilated in size with less than 50% respiratory variability, suggesting right atrial pressure of 15 mmHg. IAS/Shunts: The atrial septum is grossly normal.  LEFT VENTRICLE PLAX 2D LVIDd:         3.50 cm      Diastology LVIDs:         2.90 cm      LV e' medial:    5.65 cm/s LV PW:         1.50 cm      LV E/e' medial:  20.5 LV IVS:        1.10 cm      LV e' lateral:   5.49 cm/s LVOT diam:     1.50 cm      LV E/e' lateral: 21.1 LV SV:         25 LV SV Index:   12 LVOT Area:     1.77 cm  LV Volumes (MOD) LV vol d, MOD A2C: 149.0 ml LV vol d, MOD A4C: 126.0 ml LV vol s, MOD A2C: 92.6 ml LV vol s, MOD A4C: 80.9 ml LV SV MOD A2C:     56.4 ml LV SV MOD A4C:     126.0 ml LV SV MOD BP:      49.7 ml RIGHT VENTRICLE             IVC RV S prime:     13.60 cm/s  IVC diam: 2.50 cm TAPSE (M-mode): 1.9 cm LEFT ATRIUM             Index        RIGHT ATRIUM           Index LA diam:        3.40 cm 1.65 cm/m   RA Area:     26.10 cm LA Vol (A2C):   89.2 ml 43.38 ml/m  RA Volume:   87.80 ml  42.70 ml/m LA Vol (A4C):   91.0 ml 44.26 ml/m LA Biplane Vol: 99.1 ml 48.20 ml/m  AORTIC VALVE AV Area (Vmax):    0.57 cm AV Area (Vmean):   0.55 cm AV Area (VTI):     0.56 cm AV Vmax:           211.00 cm/s AV Vmean:          155.750 cm/s AV VTI:            0.442 m AV Peak Grad:      17.8 mmHg AV Mean Grad:  10.8 mmHg LVOT Vmax:         67.55 cm/s LVOT Vmean:        48.450 cm/s LVOT VTI:          0.139 m LVOT/AV VTI ratio: 0.31 AI PHT:            368 msec  AORTA Ao Root diam: 3.50 cm Ao Asc diam:  3.00 cm MITRAL VALVE                TRICUSPID VALVE MV Area (PHT): 4.89 cm     TR Peak grad:   29.2 mmHg MV Area VTI:   0.89 cm     TR Vmax:        270.00 cm/s MV Peak grad:  5.1 mmHg MV Mean grad:  1.0 mmHg     SHUNTS MV Vmax:       1.13 m/s     Systemic VTI:  0.14 m MV Vmean:      54.2 cm/s    Systemic Diam: 1.50 cm MV Decel Time: 155 msec MV E  velocity: 116.00 cm/s MV A velocity: 52.80 cm/s MV E/A ratio:  2.20 Laurance Flatten MD Electronically signed by Laurance Flatten MD Signature Date/Time: 08/17/2022/12:15:50 PM    Final    DG CHEST PORT 1 VIEW  Result Date: 08/17/2022 CLINICAL DATA:  Hypoxia EXAM: PORTABLE CHEST 1 VIEW COMPARISON:  Chest CT and radiograph from yesterday FINDINGS: Edema, pleural effusions, and atelectasis by recent CT. More focal opacity in the right mid chest above the minor fissure since prior. No pneumothorax. Cardiomegaly and vascular pedicle widening is stable. IMPRESSION: 1. Improved pulmonary edema. 2. New and focal opacity in the right mid lung, cannot exclude superimposed pneumonia. Electronically Signed   By: Tiburcio Pea M.D.   On: 08/17/2022 05:06   CT Angio Chest PE W and/or Wo Contrast  Result Date: 08/30/2022 CLINICAL DATA:  Positive D-dimer. Intermediate probability for pulmonary embolus. EXAM: CT ANGIOGRAPHY CHEST WITH CONTRAST TECHNIQUE: Multidetector CT imaging of the chest was performed using the standard protocol during bolus administration of intravenous contrast. Multiplanar CT image reconstructions and MIPs were obtained to evaluate the vascular anatomy. RADIATION DOSE REDUCTION: This exam was performed according to the departmental dose-optimization program which includes automated exposure control, adjustment of the mA and/or kV according to patient size and/or use of iterative reconstruction technique. CONTRAST:  75mL OMNIPAQUE IOHEXOL 350 MG/ML SOLN COMPARISON:  09/15/2014 FINDINGS: Cardiovascular: The heart size is upper normal to borderline enlarged. No substantial pericardial effusion. Coronary artery calcification is evident. Moderate atherosclerotic calcification is noted in the wall of the thoracic aorta. There is no filling defect within the opacified pulmonary arteries to suggest the presence of an acute pulmonary embolus. Mediastinum/Nodes: No mediastinal lymphadenopathy. There is no  hilar lymphadenopathy. The esophagus has normal imaging features. There is no axillary lymphadenopathy. Lungs/Pleura: Bilateral lower lobe collapse/consolidation is associated with small left and moderate right pleural effusions. Patchy ground-glass opacity in the upper lobes and right middle lobe may be atelectasis although component of underlying airspace infection is not excluded. Upper Abdomen: Unremarkable. Musculoskeletal: No worrisome lytic or sclerotic osseous abnormality. Review of the MIP images confirms the above findings. IMPRESSION: 1. No CT evidence for acute pulmonary embolus. 2. Bilateral lower lobe collapse/consolidation with small left and moderate right pleural effusions. 3. Patchy ground-glass opacity in the upper lobes and right middle lobe may be atelectasis although component of underlying airspace infection is not excluded. 4.  Aortic Atherosclerosis (ICD10-I70.0). Electronically  Signed   By: Kennith Center M.D.   On: 08/13/2022 15:20   DG Chest Portable 1 View  Result Date: 08/09/2022 CLINICAL DATA:  Chest pain EXAM: PORTABLE CHEST 1 VIEW COMPARISON:  None Available. FINDINGS: Enlarged cardiopericardial silhouette. There is some linear changes along lung bases. Atelectasis versus subtle infiltrate. Recommend follow-up. No pneumothorax or significant effusion. Calcified aorta. Films under penetrated. Degenerative changes of the spine. IMPRESSION: Enlarged heart. Linear opacity seen along bases. Atelectasis versus subtle infiltrate. Recommend follow-up Electronically Signed   By: Karen Kays M.D.   On: 09/01/2022 13:28   Myocardial Perfusion Imaging  Result Date: 07/23/2022   The study is normal. The study is low risk.   No ST deviation was noted.   LV perfusion is normal. There is no evidence of ischemia. There is no evidence of infarction.   Left ventricular function is normal. End diastolic cavity size is normal. End systolic cavity size is normal.   Prior study not available for  comparison. Normal resting and stress perfusion. No ischemia or infarction EF 62%    Microbiology Recent Results (from the past 240 hour(s))  Culture, blood (Routine X 2) w Reflex to ID Panel     Status: None (Preliminary result)   Collection Time: 08/17/22  4:52 AM   Specimen: BLOOD LEFT ARM  Result Value Ref Range Status   Specimen Description BLOOD LEFT ARM  Final   Special Requests   Final    BOTTLES DRAWN AEROBIC AND ANAEROBIC Blood Culture adequate volume   Culture   Final    NO GROWTH 4 DAYS Performed at [facility], [address] [place], [place] [address]    Report Status PENDING  Incomplete  Culture, blood (Routine X 2) w Reflex to ID Panel     Status: Abnormal   Collection Time: 08/17/22  5:19 AM   Specimen: BLOOD  Result Value Ref Range Status   Specimen Description BLOOD BLOOD RIGHT HAND  Final   Special Requests   Final    BOTTLES DRAWN AEROBIC AND ANAEROBIC Blood Culture adequate volume   Culture  Setup Time   Final    GRAM POSITIVE COCCI ANAEROBIC BOTTLE ONLY CRITICAL RESULT CALLED TO, READ BACK BY AND VERIFIED WITH: J [facility]  08/17/22 MK    Culture (A)  Final    STAPHYLOCOCCUS EPIDERMIDIS THE SIGNIFICANCE OF ISOLATING THIS ORGANISM FROM A SINGLE SET OF BLOOD CULTURES WHEN MULTIPLE SETS ARE DRAWN IS UNCERTAIN. PLEASE NOTIFY THE MICROBIOLOGY DEPARTMENT WITHIN ONE WEEK IF SPECIATION AND SENSITIVITIES ARE REQUIRED. Performed at [facility], [address] [place], [place] [address]    Report Status 08/20/2022 FINAL  Final  Blood Culture ID Panel (Reflexed)     Status: Abnormal   Collection Time: 08/17/22  5:19 AM  Result Value Ref Range Status   Enterococcus faecalis NOT DETECTED NOT DETECTED Final   Enterococcus Faecium NOT DETECTED NOT DETECTED Final   Listeria monocytogenes NOT DETECTED NOT DETECTED Final   Staphylococcus species DETECTED (A) NOT DETECTED Final    Comment: CRITICAL RESULT CALLED TO, READ BACK BY AND VERIFIED  WITH: J WYLAND,PHARMD@0214  08/18/22 MK    Staphylococcus aureus (BCID) NOT DETECTED NOT DETECTED Final   Staphylococcus epidermidis DETECTED (A) NOT DETECTED Final    Comment: CRITICAL RESULT CALLED TO, READ BACK BY AND VERIFIED WITH: J WYLAND,PHARMD@0214  08/18/22 MK    Staphylococcus lugdunensis NOT DETECTED NOT DETECTED Final   Streptococcus species NOT DETECTED NOT DETECTED Final   Streptococcus  agalactiae NOT DETECTED NOT DETECTED Final   Streptococcus pneumoniae NOT DETECTED NOT DETECTED Final   Streptococcus pyogenes NOT DETECTED NOT DETECTED Final   A.calcoaceticus-baumannii NOT DETECTED NOT DETECTED Final   Bacteroides fragilis NOT DETECTED NOT DETECTED Final   Enterobacterales NOT DETECTED NOT DETECTED Final   Enterobacter cloacae complex NOT DETECTED NOT DETECTED Final   Escherichia coli NOT DETECTED NOT DETECTED Final   Klebsiella aerogenes NOT DETECTED NOT DETECTED Final   Klebsiella oxytoca NOT DETECTED NOT DETECTED Final   Klebsiella pneumoniae NOT DETECTED NOT DETECTED Final   Proteus species NOT DETECTED NOT DETECTED Final   Salmonella species NOT DETECTED NOT DETECTED Final   Serratia marcescens NOT DETECTED NOT DETECTED Final   Haemophilus influenzae NOT DETECTED NOT DETECTED Final   Neisseria meningitidis NOT DETECTED NOT DETECTED Final   Pseudomonas aeruginosa NOT DETECTED NOT DETECTED Final   Stenotrophomonas maltophilia NOT DETECTED NOT DETECTED Final   Candida albicans NOT DETECTED NOT DETECTED Final   Candida auris NOT DETECTED NOT DETECTED Final   Candida glabrata NOT DETECTED NOT DETECTED Final   Candida krusei NOT DETECTED NOT DETECTED Final   Candida parapsilosis NOT DETECTED NOT DETECTED Final   Candida tropicalis NOT DETECTED NOT DETECTED Final   Cryptococcus neoformans/gattii NOT DETECTED NOT DETECTED Final   Methicillin resistance mecA/C NOT DETECTED NOT DETECTED Final    Comment: Performed at [facility], [address] [place], [place]  [address]  SARS Coronavirus 2 by RT PCR (hospital order, performed in [facility] hospital lab) *cepheid single result test* Anterior Nasal Swab     Status: None   Collection Time: 08/17/22 10:48 AM   Specimen: Anterior Nasal Swab  Result Value Ref Range Status   SARS Coronavirus 2 by RT PCR NEGATIVE NEGATIVE Final    Comment: Performed at [facility], [address] [place], [place] [address]  Respiratory (~20 pathogens) panel by PCR     Status: None   Collection Time: 08/17/22 10:48 AM   Specimen: Nasopharyngeal Swab; Respiratory  Result Value Ref Range Status   Adenovirus NOT DETECTED NOT DETECTED Final   Coronavirus 229E NOT DETECTED NOT DETECTED Final    Comment: (NOTE) The Coronavirus on the Respiratory Panel, DOES NOT test for the novel  Coronavirus (2019 nCoV)    Coronavirus HKU1 NOT DETECTED NOT DETECTED Final   Coronavirus NL63 NOT DETECTED NOT DETECTED Final   Coronavirus OC43 NOT DETECTED NOT DETECTED Final   Metapneumovirus NOT DETECTED NOT DETECTED Final   Rhinovirus / Enterovirus NOT DETECTED NOT DETECTED Final   Influenza A NOT DETECTED NOT DETECTED Final   Influenza B NOT DETECTED NOT DETECTED Final   Parainfluenza Virus 1 NOT DETECTED NOT DETECTED Final   Parainfluenza Virus 2 NOT DETECTED NOT DETECTED Final   Parainfluenza Virus 3 NOT DETECTED NOT DETECTED Final   Parainfluenza Virus 4 NOT DETECTED NOT DETECTED Final   Respiratory Syncytial Virus NOT DETECTED NOT DETECTED Final   Bordetella pertussis NOT DETECTED NOT DETECTED Final   Bordetella Parapertussis NOT DETECTED NOT DETECTED Final   Chlamydophila pneumoniae NOT DETECTED NOT DETECTED Final   Mycoplasma pneumoniae NOT DETECTED NOT DETECTED Final    Comment: Performed at [facility], [address] [place], [place] [address]  Culture, Respiratory w Gram Stain     Status: None (Preliminary result)   Collection Time: 08/18/22  2:55 PM   Specimen: Tracheal Aspirate; Respiratory  Result Value Ref  Range Status   Specimen Description  TRACHEAL ASPIRATE  Final   Special Requests NONE  Final   Gram Stain NO WBC SEEN NO ORGANISMS SEEN   Final   Culture   Final    NO GROWTH 2 DAYS Performed at [facility], [address] [place], [place] [address]    Report Status PENDING  Incomplete  MRSA Next Gen by PCR, Nasal     Status: None   Collection Time: 08/18/22  9:39 PM   Specimen: Nasal Mucosa; Nasal Swab  Result Value Ref Range Status   MRSA by PCR Next Gen NOT DETECTED NOT DETECTED Final    Comment: (NOTE) The GeneXpert MRSA Assay (FDA approved for NASAL specimens only), is one component of a comprehensive MRSA colonization surveillance program. It is not intended to diagnose MRSA infection nor to guide or monitor treatment for MRSA infections. Test performance is not FDA approved in patients less than 26 years old. Performed at [facility], [address], [place], [place] [address]     Lab Basic Metabolic Panel: Recent Labs  Lab 08/20/2022 1242 08/17/22 0312 08/18/22 1455 08/13/2022 0403 09/01/2022 0444 09/03/2022 1604 08/20/2022 1719 08/20/22 0408 08/20/22 1701 08/20/22 1727 08/29/2022 0006 09/03/2022 0249 08/12/2022 0320  NA 128*   < > 137 137   < >  --    < > 136 137  --  134*  136 136 137  K 3.9   < > 3.6 3.1*   < >  --    < > 3.5 3.5  --  3.6  3.6 3.4* 3.4*  CL 95*   < > 91* 95*  --   --   --  97*  --   --  99  --  99  CO2 17*   < > 22 25  --   --   --  24  --   --  21*  --  22  GLUCOSE 97   < > 204* 150*  --   --   --  160*  --   --  217*  --  175*  BUN 15   < > 47* 49*  --   --   --  67*  --   --  74*  --  77*  CREATININE 1.39*   < > 2.29* 2.47*  --   --   --  2.42*  --   --  2.86*  --  3.17*  CALCIUM 8.9   < > 9.0 8.5*  --   --   --  8.1*  --   --  7.7*  --  7.7*  MG 1.7  --   --   --   --  2.1  --  2.2  --  2.3  --   --  2.3  PHOS  --   --   --   --   --  4.8*  --  4.6  --  3.7  --   --  4.7*   < > = values in this interval not displayed.   Liver  Function Tests: Recent Labs  Lab 08/17/22 0312 08/20/2022 0006  AST 243* 1,540*  ALT 46* 992*  ALKPHOS 30* 31*  BILITOT 1.6* 1.5*  PROT 5.7* 5.0*  ALBUMIN 3.2* 1.9*   No results for input(s): "LIPASE", "AMYLASE" in the last 168 hours. No results for input(s): "AMMONIA" in the last 168 hours. CBC: Recent Labs  Lab 08/18/22 0246 08/18/22 0948 08/31/2022 0403 08/28/2022 0444 08/20/22  0408 08/20/22 1701 08/28/2022 0006 08/17/2022 0249 08/28/2022 0320  WBC 7.2  --  11.2*  --  9.0  --  8.5  --  10.0  NEUTROABS  --   --   --   --   --   --  6.4  --   --   HGB 12.5*   < > 12.4*   < > 11.1* 9.9* 9.9*  9.5* 9.2* 9.7*  HCT 35.8*   < > 35.5*   < > 32.2* 29.0* 29.4*  28.0* 27.0* 29.4*  MCV 89.5  --  84.3  --  87.0  --  89.1  --  90.2  PLT 170  --  224  --  195  --  200  --  206   < > = values in this interval not displayed.   Cardiac Enzymes: No results for input(s): "CKTOTAL", "CKMB", "CKMBINDEX", "TROPONINI" in the last 168 hours. Sepsis Labs: Recent Labs  Lab 08/17/22 0435 08/18/22 0246 08/18/22 0823 08/18/22 1455 08/12/2022 0403 08/20/22 0408 08/17/2022 0006 08/17/2022 0320  PROCALCITON <0.10  --   --   --   --   --   --  4.87  WBC  --    < >  --   --  11.2* 9.0 8.5 10.0  LATICACIDVEN  --   --  2.2* 2.8*  --  1.6  --   --    < > = values in this interval not displayed.    Procedures/Operations  CPR  ETT   Arrian Manson L Chantrice Hagg 08/12/2022, 10:09 AM

## 2022-09-07 NOTE — Progress Notes (Signed)
Code Blue called 6/14 AM - pt received 100 mcg fentanyl and 4 mg versed followed by another 100 mcg fentanyl when transitioned to comfort care.   Additional 4 mg of versed had been pulled from pyxis if needed for comfort care but was not given. Versed 4 mg wasted in stericycle container with myself and Tonya, RN (unable to document in pyxis).    Sherron Monday, PharmD, BCCCP Clinical Pharmacist  Phone: [phone] 08/25/2022 11:40 AM  Please check AMION for all [facility] phone numbers After 10:00 PM, call Main Pharmacy [phone]

## 2022-09-07 NOTE — Procedures (Signed)
Cardiopulmonary Resuscitation Note  ASRIEL OKON  [id number]  22-Dec-1940  Date:08/25/2022  Time:7:52 AM   Provider Performing:Nyazia Canevari L Lennox Leikam   Procedure: Cardiopulmonary Resuscitation (92950)  Indication(s) Loss of Pulse  Consent N/A  Anesthesia N/A   Time Out N/A   Sterile Technique Hand hygiene, gloves   Procedure Description Called to patient's room for CODE BLUE. Initial rhythm was PEA/Asystole. Patient received high quality chest compressions for 3 minutes with defibrillation or cardioversion when appropriate. Epinephrine was administered every 3 minutes as directed by time Biomedical engineer. Additional pharmacologic interventions included sodium bicarbonate. Return of spontaneous circulation was achieved.  Family at bedside.  Complications/Tolerance N/A  EBL N/A  Specimen(s) N/A  Estimated time to ROSC: 3 minutes  Patient was maxed on 4 pressors prior to arrest They are in agreement to change code status and not escalate care.  Comfort care orders placed.   [name], DO [facility] 08/25/2022 7:53 AM

## 2022-09-07 NOTE — Progress Notes (Signed)
Nutrition Brief Note ° °Chart reviewed. °Pt now transitioning to comfort care.  °No further nutrition interventions planned at this time.  °Please re-consult as needed. ° ° °Kate Kirill Chatterjee, MS, RD, LDN °Inpatient Clinical Dietitian °Please see AMiON for contact information. ° ° °

## 2022-09-07 NOTE — Progress Notes (Addendum)
eLink Physician-Brief Progress Note Patient Name: DARROW EGELER III DOB: May 31, 1940 MRN: [id number]   Date of Service  08/31/2022  HPI/Events of Note  Pt remains hypotensive, has just been started on a 4th pressor.  Pt with low O2 saturations, SpO2 85% on 100% FiO2 He has been off sedation fur to the hypotension, but is not really dyssycnhronous with the ventilator.  With bilateral opacities on CXR, peak pressures 20, plateau pressures 18  eICU Interventions  Considered paralytic- but given that he's off sedation, hemodynamically unstable and not really dyssynchronous with the vent, this would probably have limited benefit.  Proning would also be difficult given that he's off sedation and hemodynamically unstable.   PEEP currently at 8. Will go up to 10, then up further to 12 as tolerated. Will monitor for worsening hypotension.  Will also decrease TV to 490, which correlates with 23ml/kg PBW.  Will check ABG in 1 hour.         Lachlan Mckim M DELA CRUZ 08/30/2022, 12:34 AM

## 2022-09-07 NOTE — Progress Notes (Signed)
Chaplain rec'd Code Blue alert. Chaplain responded and found Code [employer] mostly dispersed. Family observed at bedside tearful. Chaplain rec'd report of current situation from RN, who notes that extubation is imminent. Chaplain approached family in order to inquire if having pt anointed by [employer] priest would be meaningful to which family agreed. Chaplain contacted Fr. Ree Kida from [facility] who is en route to the hospital to perform the anointing. Chaplain contacted pt's RN Dorisann Frames to relay info about [employer] priest visit. This chaplain will have another hospital chaplain also follow-up in order to provide support to family.

## 2022-09-07 NOTE — Progress Notes (Signed)
   08/13/2022 1002  Spiritual Encounters  Type of Visit Follow up  Care provided to: [facility], The partners present during encounter Nurse  Referral source Chaplain team  Reason for visit Patient death  OnCall Visit No   Chaplain received F/U from overnight chaplain.  When I arrived at the room PT had already passed.  Family reports that Father Ree Kida did respond and PT was anointed prior to extubation. Family was present in the room and grieving.  Chaplain remained with them for a few moments, providing a compassionate presence.  RB arrived to do hand prints of PT for a memory keepsake.  Chaplain excused himself to allow the family to have this moment together.

## 2022-09-07 NOTE — Progress Notes (Addendum)
Cards MD at bedside evaluating pt, updating son. Pt continuing to decline despite 2 added pressors.

## 2022-09-07 NOTE — Progress Notes (Addendum)
Dr. Welton Flakes at bedside discussing GOC with pt's family.  Addendum: sons have not decided what to do about pt's code status at this time.

## 2022-09-07 DEATH — deceased

## 2022-10-07 ENCOUNTER — Ambulatory Visit: Payer: Medicare Other | Admitting: Physician Assistant

## 2022-10-19 ENCOUNTER — Encounter: Payer: Medicare Other | Admitting: Internal Medicine

## 2022-10-20 ENCOUNTER — Encounter: Payer: Medicare Other | Admitting: Internal Medicine

## 2023-03-31 ENCOUNTER — Telehealth: Payer: Self-pay

## 2023-03-31 ENCOUNTER — Telehealth: Payer: Self-pay | Admitting: Pulmonary Disease

## 2023-03-31 NOTE — Telephone Encounter (Signed)
[employer] Office calling to get [place] to change the cause of death on a death cert that [employer] signed off on. Please call to advise. Thanks.   Abigale [employer]. Tsf to [employer]

## 2023-03-31 NOTE — Telephone Encounter (Signed)
Within minutes of sending this email request, I received another email from the attorney's office saying they are withdrawing the request to amend the death certificate.   I have emailed Agarwala, Mannam and [name] with this revised information.

## 2023-03-31 NOTE — Telephone Encounter (Signed)
Notes from an email sent to Dr. Lynnell Catalan, Dr. Levon Hedger and Dr. Chilton Greathouse.    On 08/26/2022 Dr. Denese Killings certified the death certificate for this patient in [facility] DAVE ([place] DAVE ID# [id number]).  Dr. Tonia Brooms had been the attending and completed the death summary on 08-22-23.  I was contacted via telephone and email today by estate attorney Pearson Forster, ph# [phone].  The patient's son is attempting to submit a claim for Veteran's benefits and his cause of death will need to be updated in order to qualify for VA benefits.   Per Mr. 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    The request is to have the death certificate amended to have diabetes removed from contributing to the cause of death (if appropriate) and to have documentation added in Questions 23-Part I and 23-Part II about the patient's exposure to Agent Orange as an underlying cause of his conditions.  I have requested Agarwala, Smith and Mannam review this request and amend the death certificate, if applicable.  Also, to let me know what is done so I can communicate with the attorney.

## 2023-04-02 NOTE — Telephone Encounter (Signed)
nfn

## 2023-04-13 ENCOUNTER — Other Ambulatory Visit (HOSPITAL_COMMUNITY): Payer: Medicare Other
